# Patient Record
Sex: Female | Born: 1955
Health system: Southern US, Community
[De-identification: ages and names within clinical notes are randomized; demographics above are authoritative.]

## PROBLEM LIST (undated history)

## (undated) DIAGNOSIS — R112 Nausea with vomiting, unspecified: Secondary | ICD-10-CM

## (undated) DIAGNOSIS — F419 Anxiety disorder, unspecified: Secondary | ICD-10-CM

## (undated) DIAGNOSIS — T8859XA Other complications of anesthesia, initial encounter: Secondary | ICD-10-CM

## (undated) DIAGNOSIS — T4145XA Adverse effect of unspecified anesthetic, initial encounter: Secondary | ICD-10-CM

## (undated) DIAGNOSIS — Z5189 Encounter for other specified aftercare: Secondary | ICD-10-CM

## (undated) DIAGNOSIS — N3941 Urge incontinence: Secondary | ICD-10-CM

## (undated) DIAGNOSIS — J449 Chronic obstructive pulmonary disease, unspecified: Secondary | ICD-10-CM

## (undated) DIAGNOSIS — Z87442 Personal history of urinary calculi: Secondary | ICD-10-CM

## (undated) DIAGNOSIS — M2550 Pain in unspecified joint: Secondary | ICD-10-CM

## (undated) DIAGNOSIS — I739 Peripheral vascular disease, unspecified: Secondary | ICD-10-CM

## (undated) DIAGNOSIS — F32A Depression, unspecified: Secondary | ICD-10-CM

## (undated) DIAGNOSIS — Z9889 Other specified postprocedural states: Secondary | ICD-10-CM

## (undated) DIAGNOSIS — I10 Essential (primary) hypertension: Secondary | ICD-10-CM

## (undated) DIAGNOSIS — M1990 Unspecified osteoarthritis, unspecified site: Secondary | ICD-10-CM

## (undated) DIAGNOSIS — R0902 Hypoxemia: Secondary | ICD-10-CM

## (undated) DIAGNOSIS — E785 Hyperlipidemia, unspecified: Secondary | ICD-10-CM

## (undated) DIAGNOSIS — I509 Heart failure, unspecified: Secondary | ICD-10-CM

## (undated) DIAGNOSIS — N200 Calculus of kidney: Secondary | ICD-10-CM

## (undated) DIAGNOSIS — M810 Age-related osteoporosis without current pathological fracture: Secondary | ICD-10-CM

## (undated) DIAGNOSIS — F329 Major depressive disorder, single episode, unspecified: Secondary | ICD-10-CM

## (undated) DIAGNOSIS — K219 Gastro-esophageal reflux disease without esophagitis: Secondary | ICD-10-CM

## (undated) DIAGNOSIS — M8700 Idiopathic aseptic necrosis of unspecified bone: Secondary | ICD-10-CM

## (undated) DIAGNOSIS — J45909 Unspecified asthma, uncomplicated: Secondary | ICD-10-CM

## (undated) DIAGNOSIS — K589 Irritable bowel syndrome without diarrhea: Secondary | ICD-10-CM

## (undated) DIAGNOSIS — G894 Chronic pain syndrome: Secondary | ICD-10-CM

## (undated) HISTORY — DX: Irritable bowel syndrome, unspecified: K58.9

## (undated) HISTORY — DX: Depression, unspecified: F32.A

## (undated) HISTORY — DX: Age-related osteoporosis without current pathological fracture: M81.0

## (undated) HISTORY — DX: Hypoxemia: R09.02

## (undated) HISTORY — DX: Essential (primary) hypertension: I10

## (undated) HISTORY — DX: Pain in unspecified joint: M25.50

## (undated) HISTORY — DX: Hyperlipidemia, unspecified: E78.5

## (undated) HISTORY — DX: Anxiety disorder, unspecified: F41.9

## (undated) HISTORY — PX: HERNIA REPAIR: SHX51

## (undated) HISTORY — DX: Encounter for other specified aftercare: Z51.89

## (undated) HISTORY — DX: Chronic obstructive pulmonary disease, unspecified: J44.9

## (undated) HISTORY — PX: JOINT REPLACEMENT: SHX530

## (undated) HISTORY — PX: NASAL SINUS SURGERY: SHX719

## (undated) HISTORY — DX: Heart failure, unspecified: I50.9

## (undated) HISTORY — DX: Unspecified asthma, uncomplicated: J45.909

## (undated) HISTORY — PX: TOTAL ABDOMINAL HYSTERECTOMY: SHX209

## (undated) HISTORY — PX: ABDOMINAL HYSTERECTOMY: SHX81

## (undated) HISTORY — DX: Gastro-esophageal reflux disease without esophagitis: K21.9

## (undated) HISTORY — DX: Calculus of kidney: N20.0

## (undated) HISTORY — DX: Unspecified osteoarthritis, unspecified site: M19.90

## (undated) HISTORY — DX: Urge incontinence: N39.41

## (undated) HISTORY — DX: Chronic pain syndrome: G89.4

## (undated) HISTORY — DX: Major depressive disorder, single episode, unspecified: F32.9

## (undated) HISTORY — DX: Idiopathic aseptic necrosis of unspecified bone: M87.00

## (undated) HISTORY — PX: OTHER SURGICAL HISTORY: SHX169

---

## 2003-06-01 ENCOUNTER — Other Ambulatory Visit: Payer: Self-pay

## 2003-06-23 ENCOUNTER — Other Ambulatory Visit: Payer: Self-pay

## 2003-08-05 ENCOUNTER — Inpatient Hospital Stay (HOSPITAL_COMMUNITY): Admission: RE | Admit: 2003-08-05 | Discharge: 2003-08-08 | Payer: Self-pay | Admitting: Neurosurgery

## 2003-08-26 ENCOUNTER — Inpatient Hospital Stay (HOSPITAL_COMMUNITY): Admission: AD | Admit: 2003-08-26 | Discharge: 2003-09-03 | Payer: Self-pay | Admitting: Neurosurgery

## 2004-06-13 ENCOUNTER — Inpatient Hospital Stay (HOSPITAL_COMMUNITY): Admission: EM | Admit: 2004-06-13 | Discharge: 2004-06-19 | Payer: Self-pay | Admitting: Emergency Medicine

## 2005-01-15 ENCOUNTER — Encounter: Payer: Self-pay | Admitting: Unknown Physician Specialty

## 2005-02-08 ENCOUNTER — Ambulatory Visit: Payer: Self-pay | Admitting: Unknown Physician Specialty

## 2005-02-28 ENCOUNTER — Ambulatory Visit: Payer: Self-pay | Admitting: Unknown Physician Specialty

## 2005-07-30 ENCOUNTER — Inpatient Hospital Stay: Payer: Self-pay | Admitting: Gastroenterology

## 2005-12-18 ENCOUNTER — Other Ambulatory Visit: Payer: Self-pay

## 2005-12-18 ENCOUNTER — Inpatient Hospital Stay: Payer: Self-pay | Admitting: Internal Medicine

## 2006-05-15 ENCOUNTER — Encounter: Admission: RE | Admit: 2006-05-15 | Discharge: 2006-05-15 | Payer: Self-pay | Admitting: Orthopedic Surgery

## 2006-06-10 ENCOUNTER — Ambulatory Visit: Payer: Self-pay | Admitting: Unknown Physician Specialty

## 2006-06-12 ENCOUNTER — Other Ambulatory Visit: Payer: Self-pay

## 2006-06-12 ENCOUNTER — Inpatient Hospital Stay: Payer: Self-pay | Admitting: Internal Medicine

## 2006-07-30 ENCOUNTER — Encounter: Admission: RE | Admit: 2006-07-30 | Discharge: 2006-10-28 | Payer: Self-pay | Admitting: Anesthesiology

## 2006-09-02 ENCOUNTER — Ambulatory Visit: Payer: Self-pay | Admitting: Anesthesiology

## 2006-09-20 ENCOUNTER — Emergency Department: Payer: Self-pay

## 2006-09-29 ENCOUNTER — Ambulatory Visit (HOSPITAL_COMMUNITY): Admission: RE | Admit: 2006-09-29 | Discharge: 2006-09-29 | Payer: Self-pay | Admitting: Gastroenterology

## 2006-11-05 ENCOUNTER — Ambulatory Visit: Payer: Self-pay | Admitting: General Surgery

## 2006-11-12 ENCOUNTER — Inpatient Hospital Stay: Payer: Self-pay | Admitting: General Surgery

## 2007-03-02 ENCOUNTER — Ambulatory Visit: Payer: Self-pay | Admitting: Pain Medicine

## 2007-05-22 ENCOUNTER — Encounter: Admission: RE | Admit: 2007-05-22 | Discharge: 2007-05-26 | Payer: Self-pay | Admitting: Anesthesiology

## 2007-05-26 ENCOUNTER — Ambulatory Visit: Payer: Self-pay | Admitting: Anesthesiology

## 2007-06-12 ENCOUNTER — Encounter
Admission: RE | Admit: 2007-06-12 | Discharge: 2007-06-16 | Payer: Self-pay | Admitting: Physical Medicine & Rehabilitation

## 2007-06-16 ENCOUNTER — Ambulatory Visit: Payer: Self-pay | Admitting: Physical Medicine & Rehabilitation

## 2007-10-06 ENCOUNTER — Emergency Department: Payer: Self-pay

## 2007-10-06 ENCOUNTER — Other Ambulatory Visit: Payer: Self-pay

## 2008-05-03 ENCOUNTER — Ambulatory Visit: Payer: Self-pay | Admitting: Cardiovascular Disease

## 2009-06-07 ENCOUNTER — Inpatient Hospital Stay: Payer: Self-pay | Admitting: Internal Medicine

## 2010-05-02 ENCOUNTER — Ambulatory Visit: Payer: Self-pay

## 2010-06-12 ENCOUNTER — Inpatient Hospital Stay: Payer: Self-pay | Admitting: Internal Medicine

## 2010-07-03 NOTE — Procedures (Signed)
NAME:  Amy, Rocha NO.:  [id number]   MEDICAL RECORD NO.:  [id number]          PATIENT TYPE:  REC   LOCATION:  TPC                          FACILITY:  [facility]   PHYSICIAN:  Rosann Auerbach, MD        DATE OF BIRTH:  10/02/1955   DATE OF PROCEDURE:  09/02/2006  DATE OF DISCHARGE:                               OPERATIVE REPORT   A[facility] Rocha comes [facility] to evaluate and  review health and history form, 14 point review of systems, examine  historical features suggest SI pain, and I think it is probably  reasonable to go on to SI interventional injection inferior medial  margin of the true synovium, and follow expectantly.  She had a number  of hip replacements, added biomechanical stress at this joint, and I  think this is an interesting point of focus for [place] as diagnostic  therapeutic, possible RF candidate.   Other modified with features in health profile discussed.  She is a  motivated engaging individual and I think she will do well.  She wants  to avoid escalation of controlled substances.   She also has a mechanical pain above the site, at the facet but I do not  think we  need to address this at this time.  I want to see how she does  with SI.  She is complaining of leg pain, that could be attributed to  the SI, and I think this injection with very revealing for [place].  I  discussed with her that there is not complete she has mixed anterior  posterior compartment elements, some diskogenic disease, and to increase  her function and quality of life as I think tincture time is our best  friend.   OBJECTIVELY:  Diffuse paralumbar myofascial, Horton's test positive,  range of motion impaired secondary to pain, side bending positive.  Nothing new neurologically. Her typical pain mechanical diskogenic,  sacral joint supported.   Modified gains using Patrick's positive.   IMPRESSION:  Sacral joint pain, degenerative spine disease of the  lumbar  spine with facet overlay.   PLAN:  Sacral joint intervention, right and left side inferior medial  margin, under local anesthetic and she is consented.   DESCRIPTION OF PROCEDURE:  The patient taken to fluoroscopy suite and  placed in the prone position.  Back prepped, draped usual fashion.  Bilateral injection, local anesthetic, under fluoroscopic observation,  right and left side advanced the inferior medial margin the sacral joint  and confirmed placement. I then inject 2 mL of lidocaine and 20 mg of  Aristocort to each side.   She tolerated the procedure well.  No complications from our procedure.  Appropriate recovery.  Discharge instructions given.  I will see her  follow-up.           ______________________________  Rosann Auerbach, MD     HH/MEDQ  D:  09/02/2006 09:46:48  T:  09/02/2006 23:25:26  Job:  [id number]   cc:   Lesleigh Noe, M.D.

## 2010-07-03 NOTE — Op Note (Signed)
NAMESHETARA, BERROA NO.:  [id number]   MEDICAL RECORD NO.:  [id number]          PATIENT TYPE:  AMB   LOCATION:  ENDO                         FACILITY:  [facility]   PHYSICIAN:  Mayme Genta, M.D.DATE OF BIRTH:  Feb 20, 1955   DATE OF PROCEDURE:  09/29/2006  DATE OF DISCHARGE:  09/29/2006                               OPERATIVE REPORT   PROCEDURE PERFORMED:  Esophageal manometry.   INDICATIONS FOR PROCEDURE:  This is a 55 year old female referred by Dr.  Hervey Ard with symptoms of pyrosis, regurgitation and dysphagia,  symptoms occurring despite Prilosec maintenance.   DESCRIPTION OF PROCEDURE:  After reviewing the nature of the procedure  with the patient including the potential risks of hemorrhage,  perforation, aspiration, informed consent was signed.  The procedure was  completed using a low compliance nasoesophageal impedance catheter.  No  complications were noted during or immediately following the procedure.  The intubation was difficult due to a reportedly large hiatal hernia.   RESULTS:  The lower esophageal sphincter was located from 44 to 48 cm,  with a total length of 4.0 cm.  Ileus pressure measured 29.1 mmHg which  is within normal range (10-45).  A series of wet swallows were then  analyzed, 11 in all.  With these an average residual pressure of 11 mmHg  at the ileus was noted.  There was 60% relaxation overall.  The  esophageal body was normal with normal amplitudes ranging from 80 to 108  and duration of 1.8 to 2.3 seconds.   The upper esophageal sphincter was measured with a UES pressure of 42.2  mmHg.  Excellent coordination with propagated wet swallows was noted.   The catheter was withdrawn.  Again, the patient tolerated the procedure  without complication.   ASSESSMENT:  Borderline study with slight increased lower esophageal  sphincter pressure, slight decreased relaxation, but without other  features of achalasia, e.g., the  patient had normal peristaltic  esophageal body contractions.   RECOMMENDATIONS:  Per Dr. Hervey Ard.  I would recommend caution if  an antireflux procedure is being recommended, given the patient's  slightly elevated LESP.      Mayme Genta, M.D.  Electronically Signed     JRM/MEDQ  D:  10/01/2006  T:  10/01/2006  Job:  [id number]   cc:   Job Founds
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
## 2010-07-03 NOTE — Assessment & Plan Note (Signed)
Amy Rocha is a patient known to me from previous intervention last  summer.  Complaining of hip, right bursa pain, pain in the paralumbar  region with functional impairment secondary to pain.  Hip replacements.  She has also been followed in the past by our colleague, Dr. Milinda Pointer, and with her leg pain and paralumbar discomfort, she has been  addressed from a multimodality standpoint.  She has trialed a dorsal  column stimulator, followed by staff and this has been an unfortunate  recurrent problem with her.  She unfortunately had an aspiration episode  that left her with a tracheostomy and coma, this was removed, revised,  but she has some paracervical discomfort described as mild fascial from  this as well.  Paralumbar position is most problematic, with difficulty  in endurance and range of motion activities.  She is on OxyContin, which  she states helps her.  I reviewed the risks, awards, benefits, side  effects, opioid consent patient care agreement is reviewed.  She is  referred to [place] for consideration of controlled substance management and  chronic pain therapy.   OBJECTIVE:  I do observe the site where the stimulator was closed by  secondary intent.  Diffuse parallel ___________, Gilles Chiquito test positive.  A right bursa is identified, particularly with rotational pain and point  tenderness.  Pain with extension of the lumbar spine.  Rotational pain  to both hips.  Nothing new neurologically.  Modified Gaenslen and  Saralyn Pilar equivocal.  This seems to actually be improved.   IMPRESSION:  [name] has __________ lumbar spine, probable facets interim  sacral joint pain, chronic hip pain with bursa pain.  Mild fascial pain  in the cervical spine.   PLAN:  1. Recently had an MRI.  Will get that report.  It sounded like she      does have a facet pain.  2. Consider further intervention.  I introduced her to Dr. Letta Pate,      and he will be following up with her.  I  recommend a bursa      injection to start.  Possible facet intervention.  3. Other modifiable features and health profile discussed.  Weight      control.  She quit smoking in 1998.  She has some baseline COPD.      She will maintain contact with primary care.  4. UDS at next visit.  Will also go ahead and introduce principles of      adherence monitoring, and I have reviewed that with her.  Risk,      award, benefit of controlled substances is reviewed.  Rehab      colleagues will help [place] with this as well.  Consider TENS      technology.  Flector patch as an adjunct.   Discharge instructions given.  No barrier to communication.  Dr.  Letta Pate and I have discussed her extensively.           ______________________________  Rosann Auerbach, MD     HH/MedQ  D:  05/26/2007 11:12:08  T:  05/26/2007 11:53:03  Job #:  [id number]   cc:   Charlett Blake, M.D.  Fax: [phone]   Janora Norlander

## 2010-07-03 NOTE — Procedures (Signed)
NAMELUXIE, MARABELLA NO.:  [id number]   MEDICAL RECORD NO.:  [id number]           PATIENT TYPE:   LOCATION:                                 FACILITY:   PHYSICIAN:  Charlett Blake, M.D.DATE OF BIRTH:  01-01-1956   DATE OF PROCEDURE:  DATE OF DISCHARGE:                               OPERATIVE REPORT   Right hip trochanteric bursa injection.   INDICATIONS:  Right trochanteric bursitis, unable to take NSAIDs  secondary to history of gastric ulcers and esophageal erosions.  Pain is  only partially responsive to medication management including narcotic  analgesic medications.   Informed consent was obtained after describing risks and benefits of the  procedure to the patient including bleeding, bruising, infection.  She  elected to proceed and has given written consent.  The patient placed in  the left lateral decubitus position.  Area marked, prepped with Betadine  over the greater trochanter.  Entry with 25-gauge 1-1/2 inch needle to  bone contact, infiltrated solution containing 1 mL of 40 mg/mL of Depo-  Medrol and 4 mL of 1% lidocaine in a fan-like pattern after negative  drawback for blood.  The patient tolerated the procedure well.  Post  injection instructions given.      Charlett Blake, M.D.  Electronically Signed     AEK/MEDQ  D:  06/16/2007 10:24:02  T:  06/16/2007 10:36:41  Job:  [id number]

## 2010-07-06 NOTE — H&P (Signed)
NAME:  Amy Rocha, GEARHEART NO.:  [id number]   MEDICAL RECORD NO.:  [id number]          PATIENT TYPE:  EMS   LOCATION:  MAJO                         FACILITY:  [facility]   PHYSICIAN:  Cyril Mourning, D.O.    DATE OF BIRTH:  11-11-1955   DATE OF ADMISSION:  06/13/2004  DATE OF DISCHARGE:                                HISTORY & PHYSICAL   PRIMARY CARE PHYSICIAN:  Unassigned to this hospital.  She does see Dr.  Kathrine Haddock as her primary care Cal[facility] Mad[facility].   CHIEF COMPLAINT:  Nausea and vomiting.   HISTORY OF PRESENTING ILLNESS:  This is a pleasant 55 year old female  independent with her ADLs, who presents with initial complaints of shortness  of breath and nausea and vomiting that have been constant without any waxing  or waning features.  She states she had dark emesis and loose stools.  The  last time she ate was on Monday.  She states that she has been unable to  hold food down at this point.   She does not recall eating out or eating out of her usual pattern.   She did report some shortness of breath on initial presentation; however,  she denies this at this time.  She says she does develop this with some  exertion.   PAST MEDICAL HISTORY:  1.  History of hip replacements x5 due to avascular necrosis.  2.  Pneumonia in 1998.  3.  Hypertension, 2 years of known diagnosis.  4.  Status post hysterectomy; she retains her ovaries.   No other surgeries that she has mentioned.   MEDICATIONS:  1.  She takes Tylenol No. 3.  2.  She takes Lexapro 20 mg daily.  3.  Diovan 160 mg daily.   ALLERGIES:  She is not allergic to any medications.   SOCIAL HISTORY:  She lives with her husband.  She denies tobacco or alcohol.  She is on total disability.   REVIEW OF SYSTEMS:  She reports some weight loss.  She reports some  dizziness.  She reports some coffee-colored stools, no hematemesis was  reported, no coffee-grounds emesis or stool were reported.  She has no  increased  swelling of her lower extremities.  Otherwise, further review of  systems was unremarkable.   FAMILY HISTORY:  Noncontributory.   PHYSICAL EXAM:  VITAL SIGNS:  Blood pressure was 137/85, respirations 20,  heart rate 120-130, O2 saturation 97% on room air.  GENERAL:  Amy Rocha is awake and alert.  She did appear to be nauseated.  She is in no acute respiratory distress.  HEENT:  Head is normocephalic, atraumatic.  Extraocular muscles are intact.  NECK:  Neck is supple and nontender.  No palpable mass or thyromegaly.  CARDIOVASCULAR:  Exam reveals normal S1 and S2, without S3 or S4.  PULMONARY:  Exam reveals her lungs to be clear to auscultation bilaterally  with normal effort and no dullness to percussion.  NEUROLOGIC:  Exam reveals no focal neurologic deficits.   LABORATORY DATA:  The patient's initial CK was 750 with an MB fraction of  26.4 and  a relative index of 13.3.  Her creatinine is 1.2. Hemoglobin 17.3.  Sodium was 138, potassium 27.  WBC of 19, hemoglobin 15, platelet count of  495,000.   EKG revealed sinus tachycardia with left atrial enlargement and questionable  inferior infarct.   ASSESSMENT:  1.  Intractable nausea and vomiting.  2.  Abnormal EKG with elevated CK-MB fractions.  3.  Leukocytosis.  4.  Hypokalemia.  5.  Hypertension.  6.  History of pneumonia.   PLAN:  At this time, we are going to admit Ms. Abed for further  evaluation.  We are going to keep her n.p.o. for now except sips of H20,  follow course clinically, administer IV fluids and I will cycle cardiac  markers to ensure that she has not suffered an ischemic event.  Her EKG is  borderline in appearance with respect to the inferior leads.  Continue  antiemetic management.  We will supplement her oxygen in addition to  initiate the very low-dose beta blocker therapy as her blood pressure  tolerates.      ESS/MEDQ  D:  06/13/2004  T:  06/13/2004  Job:  [id number]

## 2010-07-06 NOTE — Consult Note (Signed)
NAMEHOPE, ROSENDO NO.:  [id number]   MEDICAL RECORD NO.:  [id number]          PATIENT TYPE:  INP   LOCATION:  2020                         FACILITY:  [facility]   PHYSICIAN:  Hanley Ben, M.D.  DATE OF BIRTH:  02/10/1956   DATE OF CONSULTATION:  06/14/2004  DATE OF DISCHARGE:                                   CONSULTATION   REASON FOR CONSULTATION:  Abdominal pain and left kidney stone.   The patient is a 55 year old female who started having left flank pain four  days ago.  The pain became severe three days ago and was associated with  nausea and vomiting.  She came to the emergency room and was then admitted  for further evaluation and treatment.  She was seen by GI and had an  endoscopy that showed distal erosive esophagitis.  A CT scan of the abdomen  and pelvis showed a 5 mm stone in the left upper ureter.  The patient  continues to complain of abdominal pain and has a nasogastric tube in place.  She had no previous history of kidney stone and had no frequency, urgency,  hesitancy or hematuria.  She now has an indwelling Foley catheter that is  draining clear urine.   PAST MEDICAL HISTORY:  Positive for hypertension.   MEDICATIONS:  Diovan 160 mg and Lexapro 20 mg.   ALLERGIES:  No known drug allergies.   PAST SURGICAL HISTORY:  She had four hip replacements on the left side and  one hip replacement on the right side, and hysterectomy.   SOCIAL HISTORY:  She is married, has two children.  Does not smoke nor  drink.   FAMILY HISTORY:  Her father died of colon cancer.  Her mother is doing well.  She had two brothers.  One had kidney stones, and he died of a brain  aneurysm, and she has one sister.   REVIEW OF SYSTEMS:  She has no cough, no shortness of breath, no hemoptysis,  no palpitations, no chest pain.  She has nausea and vomiting, and she has a  nasogastric tube in place, and she complains of abdominal pain.  She has no  diarrhea or  constipation.  She has a Foley catheter that is now draining  clear urine, but she did not have any voiding symptoms before  hospitalization.  All others are negative.   PHYSICAL EXAMINATION:  VITAL SIGNS:  Blood pressure is 120/80, pulse 124,  respirations 20, temperature 101.  HEENT:  Her head is normal.  Pupils are equal and reactive to light and  accommodation.  She has pink conjunctivae.  She has nonicteric sclerae.  Ears, nose and throat are within normal limits.  NECK:  Supple, no cervical lymph nodes, no thyromegaly.  CHEST:  Symmetrical.  Lungs are fully expanded and clear to percussion and  auscultation.  CARDIAC:  Regular rhythm, no murmur, no gallops.  ABDOMEN:  Soft, tender in the left flank.  She has left CVA tenderness.  Kidneys are not palpable.  She has no hepatomegaly, no splenomegaly.  No  inguinal hernia.  Bladder is not  distended.  Bowel sounds are normal.  GENITOURINARY:  She has normal external genitalia.  Meatus is normal.  She  has no caruncle.  She has a Foley catheter that is draining clear urine.  She has no cystocele.  PELVIC:  There is no pelvic mass.  She is status post a hysterectomy.  There  is no adnexal mass.  RECTAL:  Sphincter tone is normal, there is no evidence of rectal mass.   Hemoglobin is 15.4, hematocrit of 43.8, WBC 19,000.  BUN is 27 and sodium  138, potassium 3.5, glucose 222, creatinine 1.2.  I reviewed the CT scan,  and it showed a 5 mm calculus in the proximal left ureter distal to the UPJ  and a simple cyst in the lower pole of the left kidney.   IMPRESSION:  Left ureteral calculus and left renal cyst.   SUGGESTIONS:  Urine culture and sensitivity.  Continue Zosyn.  Cystoscopy  and retrograde pyelogram and insertion of double J catheter to relieve the  pain associated with the ureteral calculus.  The procedure, the risks and  the benefits have been discussed with the patient and her husband.  They  understand and are agreeable.  We  will schedule her for the procedure to be  done tomorrow morning either at [facility] or at [facility].      MN/MEDQ  D:  06/14/2004  T:  06/14/2004  Job:  [id number]   cc:   Rexene Alberts, M.D.

## 2010-07-06 NOTE — Discharge Summary (Signed)
Amy Rocha, LAL NO.:  [id number]   MEDICAL RECORD NO.:  [id number]          PATIENT TYPE:  INP   LOCATION:  2020                         FACILITY:  [facility]   PHYSICIAN:  Barbette Merino, M.D.      DATE OF BIRTH:  October 18, 1955   DATE OF ADMISSION:  06/13/2004  DATE OF DISCHARGE:  06/19/2004                                 DISCHARGE SUMMARY   DISCHARGE DIAGNOSIS:  1.  Enterobacter urinary tract infection.  2.  Erosive esophagitis with hiatal hernia.  3.  Severe anxiety.  4.  Hypertension.  5.  Renal calculi.  6.  Sore throat.  7.  Perianal rash.   DISCHARGE MEDICATIONS:  Aspirin 81 mg daily, Atenolol 25 mg daily, Lexapro  10 mg daily, Ciprofloxacin 25 mg b.i.d. for five more days, Protonix 40 mg  b.i.d., Lotrimin 1% cream applied to the perianal area, Vicodin 5/500 1-2  tablets every 6 hours as needed for pain.   DISPOSITION:  The patient is discharged in fair health.  She is to follow up  with Dr. Burnett Harry in his office in the next two weeks.  Also, the patient has  been instructed to monitor her fluid intake.  Also, she will continue to  take her Protonix twice a day.   PROCEDURE PERFORMED:  1.  Acute abdominal series performed on June 13, 2004, that shows probable      hiatal hernia, normal bowel gas pattern, calcification in the left side      of the abdomen and pelvis.  Abdominal ultrasound on the same day showed      nonvisualized pancreas due to overlying bowel gas, 4.9 cm simple cyst      arising from the lower pole of the left kidney.  2.  CT of the abdomen and pelvis performed on June 13, 2004, showed a 5 mm      left UPJ calculus with inflammatory changes of the ureter.  It was      nonobstructive calculus.  NG tube was seen in the duodenum.  3.  Cystoscopy and left retrograde pyelogram as well as insertion of left      double  J catheter performed by Dr. Hanley Ben on June 15, 2004.  4.  EGD performed on June 13, 2004, by Dr. Herbie Baltimore Buccini  that shows      extensive erosive esophagitis, no definite source of GI bleed      identified, no active bleeding at the time of exam.  Also, no reason for      nausea and vomiting seen endoscopically but moderately large hiatal      hernia with patulous diaphragmatic hiatus.   CONSULTATIONS:  1.  Ronald Lobo, M.D., GI.  2.  Hanley Ben, M.D., urologist   BRIEF HISTORY AND PHYSICAL:  Please refer to dictated history and physical  by Dr. [name], in short, however, this is a 55 year old white female  who presented with initial complaint of shortness of breath and nausea and  vomiting which has been constant without any waxing and waning.  She also  reported having some dark  emesis and loose stools.  The patient said that  she has been unable to keep anything down.  She apparently had a similar  episode last year while she was on a trip to [place].  In the ED, she was  found to be retching with vomiting.  She was also nauseated continuously at  the time.  She was dehydrated and tachycardic with a heart rate of 120 to  130.  Otherwise, her physical exam was OK.  Her vital signs showed initial  CK 750 with MB fraction that was low.  Creatinine 1.3, hemoglobin 17.3.  Her  white count was 19,000.  Her EKG showed only sinus tachycardia with left  atrial enlargement and questionable inferior infarction.  The patient was  subsequently admitted for management of intractable nausea and vomiting and  hematemesis.   HOSPITAL COURSE:  Problem 1:  Intractable nausea and vomiting.  The patient was admitted for  management of these findings and had immediate GI consult.  She had an EGD  performed to further evaluate on the day of admission.  The EGD report is as  indicated above showing mainly hiatal hernia but no other source.  She was  started on a combination of Phenergan and Zofran as well as Reglan while in  the hospital.  Her PPI was also doubled.  The patient gradually got better  and  her symptoms resolved.  She, however, also had fever and evidence of a  UTI which was thought to be probably responsible for her intractable nausea  and vomiting.  Other possible causes were found on her CT.  Subsequently,  the patient got better with management of these findings.  At the time of  discharge, she is not nauseated.  She is eating well currently after  gradually being put back on clear liquids and then regular diet.   Problem 2:  Left ureter obstruction with inflammation, this was discovered  from the CT scan.  Subsequently, urology was consulted and Dr. Maritssa Norrie saw the  patient.  Based on the location of the calculus and possible hydronephrosis,  he went ahead and did a cystoscopy on the patient and put in a J catheter.  The stent is still in place and hopefully, the stone will pass.  The patient  continued to have pain for a couple of days thereafter, but improved and at  the time of discharge, she is not having much pain, only occasional left  lower quadrant discomfort.   Problem 3:  Erosive gastritis.  The patient had erosive gastritis on the EGD  which was thought to be worsened by her hiatal hernia.  The patient was  placed on twice a day Protonix and this seems to be helping the patient's  symptoms at this time.   Problem 4:  UTI.  The patient had Enterococcus bacteria grown from her  urine.  Sensitivity showed that this was sensitive to Cipro.  She was  initially on Rocephin for her UTI and then Zosyn for coverage.  However,  after sensitivities came back, she was switched to ciprofloxacin and is now  being discharged on oral Cipro for another five days so she will have a  total of ten days antibiotic therapy.  She improved and at the time of  discharge, she is not having any urinary symptoms.   Problem 5:  Hypokalemia.  The patient had hypokalemia from her nausea and  vomiting.  Her potassium was repleted and she did very well.  Problem 6:  Depression/anxiety.  She  had an anxiety disorder, she has been  on Lexapro.  During this admission, she was initially put on IV Ativan while  she was n.p.o.  However, she was placed back on her Lexapro as soon as she  started eating.   Problem 7:  Increased CK.  On admission, CK was as high as 1985, but  gradually came down.  She had serial cardiac enzymes especially because of  the EKG changes.  There was no evidence of cardiac event.  Her LFTs were  also elevated but returned to normal.  Abdominal ultrasound showed no  evidence of gallstones.   Problem 8:  Perianal rash.  The patient complained of an anal rash on June 17, 2004, with itching and pain.  She was initiated on Lotrimin cream which  she will continue to use at home.   Problem 9:  Sore throat.  The patient complained of sore throat from her  EGD.  She was given Chloraseptic mouth spray which seemed to help the  patient a lot.      LG/MEDQ  D:  06/19/2004  T:  06/19/2004  Job:  [id number]   cc:   Zella Richer. Burnett Harry, M.D.  [place]  [place] [address]  Fax: [phone]

## 2011-02-22 DIAGNOSIS — E785 Hyperlipidemia, unspecified: Secondary | ICD-10-CM | POA: Diagnosis not present

## 2011-02-22 DIAGNOSIS — I10 Essential (primary) hypertension: Secondary | ICD-10-CM | POA: Diagnosis not present

## 2011-03-14 DIAGNOSIS — B351 Tinea unguium: Secondary | ICD-10-CM | POA: Diagnosis not present

## 2011-03-14 DIAGNOSIS — J209 Acute bronchitis, unspecified: Secondary | ICD-10-CM | POA: Diagnosis not present

## 2011-03-14 DIAGNOSIS — J449 Chronic obstructive pulmonary disease, unspecified: Secondary | ICD-10-CM | POA: Diagnosis not present

## 2011-03-14 DIAGNOSIS — G8929 Other chronic pain: Secondary | ICD-10-CM | POA: Diagnosis not present

## 2011-03-19 DIAGNOSIS — R05 Cough: Secondary | ICD-10-CM | POA: Diagnosis not present

## 2011-03-19 DIAGNOSIS — J45909 Unspecified asthma, uncomplicated: Secondary | ICD-10-CM | POA: Diagnosis not present

## 2011-05-22 DIAGNOSIS — G8929 Other chronic pain: Secondary | ICD-10-CM | POA: Diagnosis not present

## 2011-08-20 DIAGNOSIS — E785 Hyperlipidemia, unspecified: Secondary | ICD-10-CM | POA: Diagnosis not present

## 2011-08-20 DIAGNOSIS — I10 Essential (primary) hypertension: Secondary | ICD-10-CM | POA: Diagnosis not present

## 2011-10-29 DIAGNOSIS — J449 Chronic obstructive pulmonary disease, unspecified: Secondary | ICD-10-CM | POA: Diagnosis not present

## 2011-10-29 DIAGNOSIS — Z23 Encounter for immunization: Secondary | ICD-10-CM | POA: Diagnosis not present

## 2011-10-29 DIAGNOSIS — G8929 Other chronic pain: Secondary | ICD-10-CM | POA: Diagnosis not present

## 2012-02-26 DIAGNOSIS — I10 Essential (primary) hypertension: Secondary | ICD-10-CM | POA: Diagnosis not present

## 2012-02-26 DIAGNOSIS — G8929 Other chronic pain: Secondary | ICD-10-CM | POA: Diagnosis not present

## 2012-02-26 DIAGNOSIS — E7800 Pure hypercholesterolemia, unspecified: Secondary | ICD-10-CM | POA: Diagnosis not present

## 2012-02-26 DIAGNOSIS — E785 Hyperlipidemia, unspecified: Secondary | ICD-10-CM | POA: Diagnosis not present

## 2012-02-26 DIAGNOSIS — Z79899 Other long term (current) drug therapy: Secondary | ICD-10-CM | POA: Diagnosis not present

## 2012-05-19 ENCOUNTER — Emergency Department: Payer: Self-pay | Admitting: Emergency Medicine

## 2012-05-19 DIAGNOSIS — S42309A Unspecified fracture of shaft of humerus, unspecified arm, initial encounter for closed fracture: Secondary | ICD-10-CM | POA: Diagnosis not present

## 2012-05-19 DIAGNOSIS — S32509A Unspecified fracture of unspecified pubis, initial encounter for closed fracture: Secondary | ICD-10-CM | POA: Diagnosis not present

## 2012-05-19 DIAGNOSIS — S52609A Unspecified fracture of lower end of unspecified ulna, initial encounter for closed fracture: Secondary | ICD-10-CM | POA: Diagnosis not present

## 2012-05-19 DIAGNOSIS — S52509A Unspecified fracture of the lower end of unspecified radius, initial encounter for closed fracture: Secondary | ICD-10-CM | POA: Diagnosis not present

## 2012-05-19 DIAGNOSIS — Z043 Encounter for examination and observation following other accident: Secondary | ICD-10-CM | POA: Diagnosis not present

## 2012-05-19 DIAGNOSIS — S5290XA Unspecified fracture of unspecified forearm, initial encounter for closed fracture: Secondary | ICD-10-CM | POA: Diagnosis not present

## 2012-05-19 DIAGNOSIS — S52599A Other fractures of lower end of unspecified radius, initial encounter for closed fracture: Secondary | ICD-10-CM | POA: Diagnosis not present

## 2012-05-25 DIAGNOSIS — S32509A Unspecified fracture of unspecified pubis, initial encounter for closed fracture: Secondary | ICD-10-CM | POA: Diagnosis not present

## 2012-05-25 DIAGNOSIS — S52599A Other fractures of lower end of unspecified radius, initial encounter for closed fracture: Secondary | ICD-10-CM | POA: Diagnosis not present

## 2012-06-01 DIAGNOSIS — S5290XD Unspecified fracture of unspecified forearm, subsequent encounter for closed fracture with routine healing: Secondary | ICD-10-CM | POA: Diagnosis not present

## 2012-06-01 DIAGNOSIS — IMO0001 Reserved for inherently not codable concepts without codable children: Secondary | ICD-10-CM | POA: Diagnosis not present

## 2012-06-10 DIAGNOSIS — E785 Hyperlipidemia, unspecified: Secondary | ICD-10-CM | POA: Diagnosis not present

## 2012-06-10 DIAGNOSIS — G8929 Other chronic pain: Secondary | ICD-10-CM | POA: Diagnosis not present

## 2012-06-22 DIAGNOSIS — S5290XD Unspecified fracture of unspecified forearm, subsequent encounter for closed fracture with routine healing: Secondary | ICD-10-CM | POA: Diagnosis not present

## 2012-06-22 DIAGNOSIS — IMO0001 Reserved for inherently not codable concepts without codable children: Secondary | ICD-10-CM | POA: Diagnosis not present

## 2012-07-10 DIAGNOSIS — G8929 Other chronic pain: Secondary | ICD-10-CM | POA: Diagnosis not present

## 2012-07-10 DIAGNOSIS — E785 Hyperlipidemia, unspecified: Secondary | ICD-10-CM | POA: Diagnosis not present

## 2012-07-15 DIAGNOSIS — IMO0001 Reserved for inherently not codable concepts without codable children: Secondary | ICD-10-CM | POA: Diagnosis not present

## 2012-07-15 DIAGNOSIS — S5290XD Unspecified fracture of unspecified forearm, subsequent encounter for closed fracture with routine healing: Secondary | ICD-10-CM | POA: Diagnosis not present

## 2012-08-31 DIAGNOSIS — I10 Essential (primary) hypertension: Secondary | ICD-10-CM | POA: Diagnosis not present

## 2012-08-31 DIAGNOSIS — E785 Hyperlipidemia, unspecified: Secondary | ICD-10-CM | POA: Diagnosis not present

## 2012-09-29 ENCOUNTER — Ambulatory Visit: Payer: Self-pay

## 2012-09-29 DIAGNOSIS — G8929 Other chronic pain: Secondary | ICD-10-CM | POA: Diagnosis not present

## 2012-09-29 DIAGNOSIS — M810 Age-related osteoporosis without current pathological fracture: Secondary | ICD-10-CM | POA: Diagnosis not present

## 2012-09-29 DIAGNOSIS — Z1231 Encounter for screening mammogram for malignant neoplasm of breast: Secondary | ICD-10-CM | POA: Diagnosis not present

## 2012-09-29 DIAGNOSIS — I10 Essential (primary) hypertension: Secondary | ICD-10-CM | POA: Diagnosis not present

## 2012-12-30 DIAGNOSIS — I10 Essential (primary) hypertension: Secondary | ICD-10-CM | POA: Diagnosis not present

## 2012-12-30 DIAGNOSIS — G8929 Other chronic pain: Secondary | ICD-10-CM | POA: Diagnosis not present

## 2012-12-30 DIAGNOSIS — J449 Chronic obstructive pulmonary disease, unspecified: Secondary | ICD-10-CM | POA: Diagnosis not present

## 2012-12-30 DIAGNOSIS — Z23 Encounter for immunization: Secondary | ICD-10-CM | POA: Diagnosis not present

## 2012-12-30 DIAGNOSIS — E785 Hyperlipidemia, unspecified: Secondary | ICD-10-CM | POA: Diagnosis not present

## 2013-03-26 DIAGNOSIS — I509 Heart failure, unspecified: Secondary | ICD-10-CM | POA: Diagnosis not present

## 2013-03-26 DIAGNOSIS — Z79899 Other long term (current) drug therapy: Secondary | ICD-10-CM | POA: Diagnosis not present

## 2013-03-26 DIAGNOSIS — I10 Essential (primary) hypertension: Secondary | ICD-10-CM | POA: Diagnosis not present

## 2013-03-26 DIAGNOSIS — G8929 Other chronic pain: Secondary | ICD-10-CM | POA: Diagnosis not present

## 2013-03-26 DIAGNOSIS — E785 Hyperlipidemia, unspecified: Secondary | ICD-10-CM | POA: Diagnosis not present

## 2013-03-26 DIAGNOSIS — F322 Major depressive disorder, single episode, severe without psychotic features: Secondary | ICD-10-CM | POA: Diagnosis not present

## 2013-03-26 DIAGNOSIS — I110 Hypertensive heart disease with heart failure: Secondary | ICD-10-CM | POA: Diagnosis not present

## 2013-04-21 DIAGNOSIS — J441 Chronic obstructive pulmonary disease with (acute) exacerbation: Secondary | ICD-10-CM | POA: Diagnosis not present

## 2013-04-21 DIAGNOSIS — G8929 Other chronic pain: Secondary | ICD-10-CM | POA: Diagnosis not present

## 2013-07-19 DIAGNOSIS — E785 Hyperlipidemia, unspecified: Secondary | ICD-10-CM | POA: Diagnosis not present

## 2013-07-19 DIAGNOSIS — J019 Acute sinusitis, unspecified: Secondary | ICD-10-CM | POA: Diagnosis not present

## 2013-07-19 DIAGNOSIS — I10 Essential (primary) hypertension: Secondary | ICD-10-CM | POA: Diagnosis not present

## 2013-08-18 DIAGNOSIS — R1310 Dysphagia, unspecified: Secondary | ICD-10-CM | POA: Diagnosis not present

## 2013-08-18 DIAGNOSIS — K219 Gastro-esophageal reflux disease without esophagitis: Secondary | ICD-10-CM | POA: Diagnosis not present

## 2013-08-19 DIAGNOSIS — K259 Gastric ulcer, unspecified as acute or chronic, without hemorrhage or perforation: Secondary | ICD-10-CM | POA: Diagnosis not present

## 2013-08-24 DIAGNOSIS — K2970 Gastritis, unspecified, without bleeding: Secondary | ICD-10-CM | POA: Diagnosis not present

## 2013-08-24 DIAGNOSIS — R1310 Dysphagia, unspecified: Secondary | ICD-10-CM | POA: Diagnosis not present

## 2013-08-24 DIAGNOSIS — K2990 Gastroduodenitis, unspecified, without bleeding: Secondary | ICD-10-CM | POA: Diagnosis not present

## 2013-08-24 DIAGNOSIS — K2940 Chronic atrophic gastritis without bleeding: Secondary | ICD-10-CM | POA: Diagnosis not present

## 2013-08-24 DIAGNOSIS — K222 Esophageal obstruction: Secondary | ICD-10-CM | POA: Diagnosis not present

## 2013-09-14 ENCOUNTER — Emergency Department: Payer: Self-pay | Admitting: Emergency Medicine

## 2013-09-14 DIAGNOSIS — Z96649 Presence of unspecified artificial hip joint: Secondary | ICD-10-CM | POA: Diagnosis not present

## 2013-09-14 DIAGNOSIS — R443 Hallucinations, unspecified: Secondary | ICD-10-CM | POA: Diagnosis not present

## 2013-09-14 DIAGNOSIS — I10 Essential (primary) hypertension: Secondary | ICD-10-CM | POA: Diagnosis not present

## 2013-09-14 DIAGNOSIS — R5383 Other fatigue: Secondary | ICD-10-CM | POA: Diagnosis not present

## 2013-09-14 DIAGNOSIS — Z9079 Acquired absence of other genital organ(s): Secondary | ICD-10-CM | POA: Diagnosis not present

## 2013-09-14 DIAGNOSIS — F29 Unspecified psychosis not due to a substance or known physiological condition: Secondary | ICD-10-CM | POA: Diagnosis not present

## 2013-09-14 DIAGNOSIS — R5381 Other malaise: Secondary | ICD-10-CM | POA: Diagnosis not present

## 2013-09-14 DIAGNOSIS — Z888 Allergy status to other drugs, medicaments and biological substances status: Secondary | ICD-10-CM | POA: Diagnosis not present

## 2013-09-14 DIAGNOSIS — Z79899 Other long term (current) drug therapy: Secondary | ICD-10-CM | POA: Diagnosis not present

## 2013-09-14 LAB — URINALYSIS, COMPLETE
BACTERIA: NONE SEEN
BLOOD: NEGATIVE
Bilirubin,UR: NEGATIVE
GLUCOSE, UR: NEGATIVE mg/dL (ref 0–75)
KETONE: NEGATIVE
LEUKOCYTE ESTERASE: NEGATIVE
NITRITE: NEGATIVE
PROTEIN: NEGATIVE
Ph: 6 (ref 4.5–8.0)
SPECIFIC GRAVITY: 1.002 (ref 1.003–1.030)
SQUAMOUS EPITHELIAL: NONE SEEN
WBC UR: 3 /HPF (ref 0–5)

## 2013-09-14 LAB — COMPREHENSIVE METABOLIC PANEL
ALBUMIN: 3.9 g/dL (ref 3.4–5.0)
ALT: 45 U/L
AST: 36 U/L (ref 15–37)
Alkaline Phosphatase: 100 U/L
Anion Gap: 10 (ref 7–16)
BUN: 8 mg/dL (ref 7–18)
Bilirubin,Total: 0.4 mg/dL (ref 0.2–1.0)
CALCIUM: 8.8 mg/dL (ref 8.5–10.1)
CREATININE: 0.88 mg/dL (ref 0.60–1.30)
Chloride: 108 mmol/L — ABNORMAL HIGH (ref 98–107)
Co2: 24 mmol/L (ref 21–32)
Glucose: 99 mg/dL (ref 65–99)
OSMOLALITY: 281 (ref 275–301)
POTASSIUM: 3.7 mmol/L (ref 3.5–5.1)
SODIUM: 142 mmol/L (ref 136–145)
Total Protein: 7.5 g/dL (ref 6.4–8.2)

## 2013-09-14 LAB — DRUG SCREEN, URINE
AMPHETAMINES, UR SCREEN: NEGATIVE (ref ?–1000)
BENZODIAZEPINE, UR SCRN: NEGATIVE (ref ?–200)
Barbiturates, Ur Screen: NEGATIVE (ref ?–200)
CANNABINOID 50 NG, UR ~~LOC~~: NEGATIVE (ref ?–50)
COCAINE METABOLITE, UR ~~LOC~~: NEGATIVE (ref ?–300)
MDMA (Ecstasy)Ur Screen: POSITIVE (ref ?–500)
METHADONE, UR SCREEN: NEGATIVE (ref ?–300)
OPIATE, UR SCREEN: NEGATIVE (ref ?–300)
PHENCYCLIDINE (PCP) UR S: NEGATIVE (ref ?–25)
TRICYCLIC, UR SCREEN: NEGATIVE (ref ?–1000)

## 2013-09-14 LAB — CBC
HCT: 47.7 % — AB (ref 35.0–47.0)
HGB: 15.3 g/dL (ref 12.0–16.0)
MCH: 30.2 pg (ref 26.0–34.0)
MCHC: 32 g/dL (ref 32.0–36.0)
MCV: 94 fL (ref 80–100)
Platelet: 385 10*3/uL (ref 150–440)
RBC: 5.06 10*6/uL (ref 3.80–5.20)
RDW: 13.3 % (ref 11.5–14.5)
WBC: 11.6 10*3/uL — AB (ref 3.6–11.0)

## 2013-09-14 LAB — ETHANOL

## 2013-09-14 LAB — ACETAMINOPHEN LEVEL

## 2013-09-14 LAB — SALICYLATE LEVEL: Salicylates, Serum: 2.3 mg/dL

## 2013-10-08 ENCOUNTER — Emergency Department: Payer: Self-pay | Admitting: Internal Medicine

## 2013-10-08 DIAGNOSIS — S63509A Unspecified sprain of unspecified wrist, initial encounter: Secondary | ICD-10-CM | POA: Diagnosis not present

## 2013-10-08 DIAGNOSIS — S6990XA Unspecified injury of unspecified wrist, hand and finger(s), initial encounter: Secondary | ICD-10-CM | POA: Diagnosis not present

## 2013-10-08 DIAGNOSIS — S60229A Contusion of unspecified hand, initial encounter: Secondary | ICD-10-CM | POA: Diagnosis not present

## 2013-10-08 DIAGNOSIS — S60219A Contusion of unspecified wrist, initial encounter: Secondary | ICD-10-CM | POA: Diagnosis not present

## 2013-10-08 DIAGNOSIS — S59919A Unspecified injury of unspecified forearm, initial encounter: Secondary | ICD-10-CM | POA: Diagnosis not present

## 2013-10-08 DIAGNOSIS — I10 Essential (primary) hypertension: Secondary | ICD-10-CM | POA: Diagnosis not present

## 2013-10-08 DIAGNOSIS — M7989 Other specified soft tissue disorders: Secondary | ICD-10-CM | POA: Diagnosis not present

## 2013-10-08 DIAGNOSIS — Z79899 Other long term (current) drug therapy: Secondary | ICD-10-CM | POA: Diagnosis not present

## 2013-10-08 DIAGNOSIS — M79609 Pain in unspecified limb: Secondary | ICD-10-CM | POA: Diagnosis not present

## 2013-10-08 DIAGNOSIS — M25539 Pain in unspecified wrist: Secondary | ICD-10-CM | POA: Diagnosis not present

## 2013-10-08 DIAGNOSIS — S59909A Unspecified injury of unspecified elbow, initial encounter: Secondary | ICD-10-CM | POA: Diagnosis not present

## 2013-10-13 DIAGNOSIS — G8929 Other chronic pain: Secondary | ICD-10-CM | POA: Diagnosis not present

## 2013-10-13 DIAGNOSIS — R809 Proteinuria, unspecified: Secondary | ICD-10-CM | POA: Diagnosis not present

## 2013-10-13 DIAGNOSIS — I129 Hypertensive chronic kidney disease with stage 1 through stage 4 chronic kidney disease, or unspecified chronic kidney disease: Secondary | ICD-10-CM | POA: Diagnosis not present

## 2013-11-23 DIAGNOSIS — D122 Benign neoplasm of ascending colon: Secondary | ICD-10-CM | POA: Diagnosis not present

## 2013-11-23 DIAGNOSIS — Z86010 Personal history of colonic polyps: Secondary | ICD-10-CM | POA: Diagnosis not present

## 2013-11-23 DIAGNOSIS — D126 Benign neoplasm of colon, unspecified: Secondary | ICD-10-CM | POA: Diagnosis not present

## 2013-11-23 DIAGNOSIS — K635 Polyp of colon: Secondary | ICD-10-CM | POA: Diagnosis not present

## 2013-11-23 DIAGNOSIS — D123 Benign neoplasm of transverse colon: Secondary | ICD-10-CM | POA: Diagnosis not present

## 2013-12-02 DIAGNOSIS — Z23 Encounter for immunization: Secondary | ICD-10-CM | POA: Diagnosis not present

## 2013-12-03 DIAGNOSIS — N183 Chronic kidney disease, stage 3 (moderate): Secondary | ICD-10-CM | POA: Diagnosis not present

## 2013-12-03 DIAGNOSIS — R809 Proteinuria, unspecified: Secondary | ICD-10-CM | POA: Diagnosis not present

## 2013-12-03 DIAGNOSIS — I10 Essential (primary) hypertension: Secondary | ICD-10-CM | POA: Diagnosis not present

## 2013-12-03 DIAGNOSIS — N2581 Secondary hyperparathyroidism of renal origin: Secondary | ICD-10-CM | POA: Diagnosis not present

## 2013-12-14 ENCOUNTER — Ambulatory Visit: Payer: Self-pay

## 2013-12-14 DIAGNOSIS — Z1231 Encounter for screening mammogram for malignant neoplasm of breast: Secondary | ICD-10-CM | POA: Diagnosis not present

## 2013-12-22 ENCOUNTER — Ambulatory Visit: Payer: Self-pay | Admitting: Nephrology

## 2013-12-22 DIAGNOSIS — N183 Chronic kidney disease, stage 3 (moderate): Secondary | ICD-10-CM | POA: Diagnosis not present

## 2013-12-22 DIAGNOSIS — N281 Cyst of kidney, acquired: Secondary | ICD-10-CM | POA: Diagnosis not present

## 2013-12-22 DIAGNOSIS — N189 Chronic kidney disease, unspecified: Secondary | ICD-10-CM | POA: Diagnosis not present

## 2014-01-07 DIAGNOSIS — I10 Essential (primary) hypertension: Secondary | ICD-10-CM | POA: Diagnosis not present

## 2014-01-07 DIAGNOSIS — J019 Acute sinusitis, unspecified: Secondary | ICD-10-CM | POA: Diagnosis not present

## 2014-01-07 DIAGNOSIS — D631 Anemia in chronic kidney disease: Secondary | ICD-10-CM | POA: Diagnosis not present

## 2014-01-07 DIAGNOSIS — R809 Proteinuria, unspecified: Secondary | ICD-10-CM | POA: Diagnosis not present

## 2014-01-07 DIAGNOSIS — N2581 Secondary hyperparathyroidism of renal origin: Secondary | ICD-10-CM | POA: Diagnosis not present

## 2014-01-07 DIAGNOSIS — N183 Chronic kidney disease, stage 3 (moderate): Secondary | ICD-10-CM | POA: Diagnosis not present

## 2014-01-07 DIAGNOSIS — R52 Pain, unspecified: Secondary | ICD-10-CM | POA: Diagnosis not present

## 2014-03-04 DIAGNOSIS — Z79891 Long term (current) use of opiate analgesic: Secondary | ICD-10-CM | POA: Diagnosis not present

## 2014-04-01 DIAGNOSIS — R52 Pain, unspecified: Secondary | ICD-10-CM | POA: Diagnosis not present

## 2014-04-08 DIAGNOSIS — R809 Proteinuria, unspecified: Secondary | ICD-10-CM | POA: Diagnosis not present

## 2014-04-08 DIAGNOSIS — D631 Anemia in chronic kidney disease: Secondary | ICD-10-CM | POA: Diagnosis not present

## 2014-04-08 DIAGNOSIS — N2581 Secondary hyperparathyroidism of renal origin: Secondary | ICD-10-CM | POA: Diagnosis not present

## 2014-04-08 DIAGNOSIS — N183 Chronic kidney disease, stage 3 (moderate): Secondary | ICD-10-CM | POA: Diagnosis not present

## 2014-04-08 DIAGNOSIS — I10 Essential (primary) hypertension: Secondary | ICD-10-CM | POA: Diagnosis not present

## 2014-04-21 DIAGNOSIS — H02831 Dermatochalasis of right upper eyelid: Secondary | ICD-10-CM | POA: Diagnosis not present

## 2014-04-21 DIAGNOSIS — H02834 Dermatochalasis of left upper eyelid: Secondary | ICD-10-CM | POA: Diagnosis not present

## 2014-05-17 DIAGNOSIS — H02831 Dermatochalasis of right upper eyelid: Secondary | ICD-10-CM | POA: Diagnosis not present

## 2014-05-17 DIAGNOSIS — H02834 Dermatochalasis of left upper eyelid: Secondary | ICD-10-CM | POA: Diagnosis not present

## 2014-05-17 DIAGNOSIS — L908 Other atrophic disorders of skin: Secondary | ICD-10-CM | POA: Diagnosis not present

## 2014-05-24 ENCOUNTER — Ambulatory Visit
Admit: 2014-05-24 | Disposition: A | Discharge status: Home / Self Care | Payer: Self-pay | Attending: Ophthalmology | Admitting: Ophthalmology

## 2014-05-24 DIAGNOSIS — E780 Pure hypercholesterolemia: Secondary | ICD-10-CM | POA: Diagnosis not present

## 2014-05-24 DIAGNOSIS — H02834 Dermatochalasis of left upper eyelid: Secondary | ICD-10-CM | POA: Diagnosis not present

## 2014-05-24 DIAGNOSIS — H02831 Dermatochalasis of right upper eyelid: Secondary | ICD-10-CM | POA: Diagnosis not present

## 2014-05-24 DIAGNOSIS — Z7951 Long term (current) use of inhaled steroids: Secondary | ICD-10-CM | POA: Diagnosis not present

## 2014-05-24 DIAGNOSIS — Z7982 Long term (current) use of aspirin: Secondary | ICD-10-CM | POA: Diagnosis not present

## 2014-05-24 DIAGNOSIS — I509 Heart failure, unspecified: Secondary | ICD-10-CM | POA: Diagnosis not present

## 2014-05-24 DIAGNOSIS — M810 Age-related osteoporosis without current pathological fracture: Secondary | ICD-10-CM | POA: Diagnosis not present

## 2014-05-24 DIAGNOSIS — L908 Other atrophic disorders of skin: Secondary | ICD-10-CM | POA: Diagnosis not present

## 2014-05-24 DIAGNOSIS — J449 Chronic obstructive pulmonary disease, unspecified: Secondary | ICD-10-CM | POA: Diagnosis not present

## 2014-05-24 DIAGNOSIS — M160 Bilateral primary osteoarthritis of hip: Secondary | ICD-10-CM | POA: Diagnosis not present

## 2014-05-24 DIAGNOSIS — I209 Angina pectoris, unspecified: Secondary | ICD-10-CM | POA: Diagnosis not present

## 2014-05-24 DIAGNOSIS — F329 Major depressive disorder, single episode, unspecified: Secondary | ICD-10-CM | POA: Diagnosis not present

## 2014-05-24 DIAGNOSIS — K219 Gastro-esophageal reflux disease without esophagitis: Secondary | ICD-10-CM | POA: Diagnosis not present

## 2014-05-24 DIAGNOSIS — Z91048 Other nonmedicinal substance allergy status: Secondary | ICD-10-CM | POA: Diagnosis not present

## 2014-05-24 DIAGNOSIS — M879 Osteonecrosis, unspecified: Secondary | ICD-10-CM | POA: Diagnosis not present

## 2014-05-24 DIAGNOSIS — Z79899 Other long term (current) drug therapy: Secondary | ICD-10-CM | POA: Diagnosis not present

## 2014-05-24 DIAGNOSIS — K279 Peptic ulcer, site unspecified, unspecified as acute or chronic, without hemorrhage or perforation: Secondary | ICD-10-CM | POA: Diagnosis not present

## 2014-05-24 DIAGNOSIS — Z96649 Presence of unspecified artificial hip joint: Secondary | ICD-10-CM | POA: Diagnosis not present

## 2014-05-24 DIAGNOSIS — I10 Essential (primary) hypertension: Secondary | ICD-10-CM | POA: Diagnosis not present

## 2014-05-24 DIAGNOSIS — Z87891 Personal history of nicotine dependence: Secondary | ICD-10-CM | POA: Diagnosis not present

## 2014-05-24 DIAGNOSIS — H02403 Unspecified ptosis of bilateral eyelids: Secondary | ICD-10-CM | POA: Diagnosis not present

## 2014-05-27 DIAGNOSIS — L239 Allergic contact dermatitis, unspecified cause: Secondary | ICD-10-CM | POA: Diagnosis not present

## 2014-06-11 NOTE — Consult Note (Signed)
PATIENT NAME:  Amy Rocha, Amy Rocha MR#:  [id number] DATE OF BIRTH:  [birth year]  DATE OF CONSULTATION:  09/14/2013  REFERRING PHYSICIAN:   CONSULTING PHYSICIAN:  Gonzella Lex, MD  IDENTIFYING INFORMATION AND REASON FOR CONSULTATION: A 59 year old woman with a history of cardiac problems, chronic pain, and chronic obstructive pulmonary disease, who presented to the Emergency Room with complaints of visual and auditory hallucinations from this morning. Consultation to rule out psychiatric component.   HISTORY OF PRESENT ILLNESS: Information obtained from the patient, from the Emergency Room physician, and from review of the chart. Patient states that this morning she was going about her usual business. She and her husband went shopping. While she was out shopping, she had an episode of feeling sweaty and dizzy and had to sit down for a little bit. After she got back home, she started to have perceptual disturbances. She describes an episode of thinking that her dog was moving an object across floor that he really was not, also says that the pictures on the wall looked like they were moving up and down. Also says that she was thinking that she was having a conversation with her son on the phone, which she actually was not doing. The patient denies that she has had any mood symptoms. Denies any other psychotic symptoms. She absolutely denies that she has had any change in any of her recent medications. She says she has been taking everything that she is on right now stably and has not missed any doses. She also says that her amitriptyline and Ativan, which she considers p.r.n. are medicines that she has not taken in a long time. She is neither acutely on them nor withdrawing from them. She tells me that she does not eat breakfast in the morning ever. She cannot recall if she had anything to drink this morning. She does not have any other physical symptoms right now.   PAST PSYCHIATRIC HISTORY: About 3 years ago  she had a hospitalization for a syncopal episode. It was ultimately thought to probably be related to dehydration. There is no history of any mental illness.  No history of suicidal behavior. I should say that she does have a history of chronic depression for which she has been taking bupropion and Cymbalta chronically, but she minimizes this.  Never been in a hospital. No history of suicidality and no recent symptoms.   SOCIAL HISTORY: Lives at home with her husband. No complaints of any recent stress.   CURRENT MEDICATIONS: Oxycodone 20 mg 3 times a day, bupropion unknown dose, Cymbalta 60 mg a day, Diovan daily, metoprolol daily, Crestor daily. Takes p.r.n. amitriptyline and Ativan. She cannot remember any other medicines right now.   ALLERGIES: SHE IS ALLERGIC TO ADVAIR DISKUS.   PRIMARY CARE DOCTOR: Dr. Rance Muir office.   REVIEW OF SYSTEMS: As noted above this morning, she had some perceptual disturbances. Right now, she denies any symptoms. Denies suicidal or homicidal ideation. Denies hallucinations. Denies mood symptoms.  Not feeling sick or dizzy.  Really no symptoms at all right now.   MENTAL STATUS EXAM:  Casually dressed woman, looks her stated age, cooperative with the interview. Good eye contact, normal psychomotor activity. Speech normal rate, tone and volume. Affect slightly blunted, but not severely so. Mood stated as okay. Thoughts appear to be generally lucid. No sign of loosening of associations or delusions. Denies auditory or visual hallucinations. Denies suicidal or homicidal ideation. Short and long-term memory grossly intact. Appears to have normal  intelligence.   LABORATORY RESULTS: Her drug screen is positive for MDMA, but I think that is probably due to the Wellbutrin and that is a common cross reaction. She has an elevated hematocrit, possibly from her chronic COPD. She has a drug screen, as I mentioned, that has the MDMA, but is otherwise negative. Really nothing else  remarkable here.   VITAL SIGNS: I do note that her pulse has been elevated throughout her hospital stay here.  It was 114 while we were talking. Blood pressure was also elevated.   ASSESSMENT: This is a 59 year old woman who presents with an episode this morning of perceptual disturbances that happened around the same time that she was having dizziness and lightheadedness and sweating. She has a past history of syncopal episodes. The symptoms that she is describing are not typical of psychiatric illness, but of transient neurologic event such as hypoxia that might come from tachycardia or dehydration. There is no sign here of depression. No sign of psychosis. No sign at all of any dangerousness to self or others. There is no indication that she is abusing drugs or has had any specific problems from her medicine.   TREATMENT PLAN: I advised the patient that I do not think she needs to be in the hospital. Dr. Thomasene Lot can talk with her about the rest of the ER plan. I advised her to follow up with her primary care doctor's office, who can check her blood pressure regularly and talk with her about her medications and review her overall treatment plan. I advised her that if these symptoms recur, I would advise she lied down and get something to drink and see if they clear up, but if the symptoms are getting worse or are more persistent or are upsetting her to a greater degree, she can certainly call her doctor or come back to the hospital.   DIAGNOSIS, PRINCIPAL AND PRIMARY:  AXIS I: Transient delirium due to medical problems.   SECONDARY DIAGNOSES:  AXIS I: History of depression, not otherwise specified.  AXIS II: Deferred.  AXIS III: Multiple medical problems, high blood pressure, chronic obstructive pulmonary disease, chronic pain.  AXIS IV: Mild to moderate.  AXIS V: Functioning at time of evaluation 58.     ____________________________ Gonzella Lex, MD jtc:ts D: 09/14/2013 16:37:15  ET T: 09/14/2013 16:46:36 ET JOB#: [id number]  cc: Gonzella Lex, MD, <Dictator> Gonzella Lex MD ELECTRONICALLY SIGNED 09/29/2013 0:38

## 2014-06-24 DIAGNOSIS — I110 Hypertensive heart disease with heart failure: Secondary | ICD-10-CM | POA: Diagnosis not present

## 2014-06-24 DIAGNOSIS — R52 Pain, unspecified: Secondary | ICD-10-CM | POA: Diagnosis not present

## 2014-06-24 DIAGNOSIS — N184 Chronic kidney disease, stage 4 (severe): Secondary | ICD-10-CM | POA: Diagnosis not present

## 2014-07-08 DIAGNOSIS — Z96641 Presence of right artificial hip joint: Secondary | ICD-10-CM | POA: Diagnosis not present

## 2014-07-08 DIAGNOSIS — M4726 Other spondylosis with radiculopathy, lumbar region: Secondary | ICD-10-CM | POA: Diagnosis not present

## 2014-07-08 DIAGNOSIS — M25551 Pain in right hip: Secondary | ICD-10-CM | POA: Diagnosis not present

## 2014-07-11 ENCOUNTER — Other Ambulatory Visit: Payer: Self-pay | Admitting: Surgery

## 2014-07-11 DIAGNOSIS — M25551 Pain in right hip: Secondary | ICD-10-CM

## 2014-07-15 ENCOUNTER — Ambulatory Visit: Admission: RE | Admit: 2014-07-15 | Payer: Self-pay | Source: Ambulatory Visit

## 2014-08-12 ENCOUNTER — Other Ambulatory Visit: Payer: Self-pay | Admitting: Unknown Physician Specialty

## 2014-08-12 MED ORDER — OXYMORPHONE HCL ER 20 MG PO T12A: 1.0000 | EXTENDED_RELEASE_TABLET | Freq: Three times a day (TID) | ORAL | Status: DC

## 2014-08-12 MED ORDER — OXYCODONE-ACETAMINOPHEN 10-325 MG PO TABS: 1.0000 | ORAL_TABLET | Freq: Four times a day (QID) | ORAL | Status: DC | PRN

## 2014-08-15 ENCOUNTER — Other Ambulatory Visit: Payer: Self-pay | Admitting: Unknown Physician Specialty

## 2014-09-08 DIAGNOSIS — I509 Heart failure, unspecified: Secondary | ICD-10-CM

## 2014-09-08 DIAGNOSIS — G8929 Other chronic pain: Secondary | ICD-10-CM

## 2014-09-08 DIAGNOSIS — F419 Anxiety disorder, unspecified: Secondary | ICD-10-CM

## 2014-09-08 DIAGNOSIS — K589 Irritable bowel syndrome without diarrhea: Secondary | ICD-10-CM | POA: Insufficient documentation

## 2014-09-08 DIAGNOSIS — I129 Hypertensive chronic kidney disease with stage 1 through stage 4 chronic kidney disease, or unspecified chronic kidney disease: Secondary | ICD-10-CM

## 2014-09-08 DIAGNOSIS — J449 Chronic obstructive pulmonary disease, unspecified: Secondary | ICD-10-CM

## 2014-09-08 DIAGNOSIS — N3941 Urge incontinence: Secondary | ICD-10-CM

## 2014-09-08 DIAGNOSIS — I2510 Atherosclerotic heart disease of native coronary artery without angina pectoris: Secondary | ICD-10-CM | POA: Insufficient documentation

## 2014-09-08 DIAGNOSIS — M8700 Idiopathic aseptic necrosis of unspecified bone: Secondary | ICD-10-CM | POA: Insufficient documentation

## 2014-09-08 DIAGNOSIS — I25119 Atherosclerotic heart disease of native coronary artery with unspecified angina pectoris: Secondary | ICD-10-CM

## 2014-09-08 DIAGNOSIS — F32A Depression, unspecified: Secondary | ICD-10-CM

## 2014-09-08 DIAGNOSIS — J45909 Unspecified asthma, uncomplicated: Secondary | ICD-10-CM

## 2014-09-08 DIAGNOSIS — N200 Calculus of kidney: Secondary | ICD-10-CM | POA: Insufficient documentation

## 2014-09-08 DIAGNOSIS — R809 Proteinuria, unspecified: Secondary | ICD-10-CM

## 2014-09-08 DIAGNOSIS — D649 Anemia, unspecified: Secondary | ICD-10-CM | POA: Insufficient documentation

## 2014-09-08 DIAGNOSIS — K219 Gastro-esophageal reflux disease without esophagitis: Secondary | ICD-10-CM

## 2014-09-08 DIAGNOSIS — G894 Chronic pain syndrome: Secondary | ICD-10-CM | POA: Insufficient documentation

## 2014-09-08 DIAGNOSIS — N184 Chronic kidney disease, stage 4 (severe): Secondary | ICD-10-CM

## 2014-09-08 DIAGNOSIS — F329 Major depressive disorder, single episode, unspecified: Secondary | ICD-10-CM

## 2014-09-08 DIAGNOSIS — I110 Hypertensive heart disease with heart failure: Secondary | ICD-10-CM | POA: Insufficient documentation

## 2014-09-08 DIAGNOSIS — E785 Hyperlipidemia, unspecified: Secondary | ICD-10-CM

## 2014-09-08 DIAGNOSIS — M2550 Pain in unspecified joint: Secondary | ICD-10-CM

## 2014-09-13 ENCOUNTER — Ambulatory Visit (INDEPENDENT_AMBULATORY_CARE_PROVIDER_SITE_OTHER): Payer: Medicare Other | Admitting: Unknown Physician Specialty

## 2014-09-13 ENCOUNTER — Encounter: Payer: Self-pay | Admitting: Unknown Physician Specialty

## 2014-09-13 VITALS — BP 131/89 | HR 104 | Temp 98.1°F | Ht 64.4 in | Wt 154.6 lb

## 2014-09-13 DIAGNOSIS — I25119 Atherosclerotic heart disease of native coronary artery with unspecified angina pectoris: Secondary | ICD-10-CM

## 2014-09-13 DIAGNOSIS — G8929 Other chronic pain: Secondary | ICD-10-CM

## 2014-09-13 MED ORDER — OXYMORPHONE HCL ER 15 MG PO T12A: 15.0000 mg | EXTENDED_RELEASE_TABLET | Freq: Three times a day (TID) | ORAL | Status: DC

## 2014-09-13 MED ORDER — OXYCODONE-ACETAMINOPHEN 10-325 MG PO TABS: 1.0000 | ORAL_TABLET | Freq: Four times a day (QID) | ORAL | Status: DC | PRN

## 2014-09-13 NOTE — Progress Notes (Signed)
BP 131/89 mmHg  Pulse 104  Temp(Src) 98.1 F (36.7 C)  Ht 5' 4.4" (1.636 m)  Wt 154 lb 9.6 oz (70.126 kg)  BMI 26.20 kg/m2  LMP 09/13/1975 (Approximate)   Subjective:    Patient ID: Amy Rocha, female    DOB: May 21, 1955, 59 y.o.   MRN: [id number]  HPI: Amy Rocha is a 59 y.o. female  Chief Complaint  Patient presents with  . Pain  . Medication Refill    Relevant past medical, surgical, family and social history reviewed and updated as indicated. Interim medical history since our last visit reviewed. Allergies and medications reviewed and updated.  1.  CHRONIC PAIN   Patient requesting refill of pain medication.  Multiple hip surgeries and 4 replacments in left hip following failed revascularization.    Last surgery was 2003.  Right hip is beginning to fail and has an appointment with Orthopedics  Pt's current pain regimen is following recommendations from [facility].  That physician is no longer there.   I referred her to a local pain doctor however, in the past he put a stimulator in and almost died with a staph infection and therefore not comfortable with him.    Pain control status:  stable  H6 What Activities task can be accomplished with current medication?  States that having pain medictions allow her to do housework, play with grand daughter,  go shopping.  Unable now to walk around the block which she would like to do.   Benefit from narcotic medications:  yes  H6   Interested in weaning off narcotics:  no H6   Previous pain specialty evaluation:  yes  H6 Non-narcotic analgesic meds :  yes  H6   Narcotic contract:  yes  H6     Patient requesting refill of pain medication. She has a prescription for a Naloxone inhaler.  She never got it refilled due to insurance concerns.  However, I asked her to check again with new laws.    Pain control status:  controlled  H6 Breakthrough pain:  no  H5    Review of Systems  Constitutional: Negative.   HENT:  Negative.   Eyes: Negative.   Respiratory: Negative.   Cardiovascular: Negative.   Gastrointestinal: Negative.   Endocrine: Negative.   Genitourinary: Negative.   Musculoskeletal: Negative.   Skin: Negative.   Allergic/Immunologic: Negative.   Neurological: Negative.   Hematological: Negative.   Psychiatric/Behavioral: Negative.     Per HPI unless specifically indicated above     Objective:    BP 131/89 mmHg  Pulse 104  Temp(Src) 98.1 F (36.7 C)  Ht 5' 4.4" (1.636 m)  Wt 154 lb 9.6 oz (70.126 kg)  BMI 26.20 kg/m2  LMP 09/13/1975 (Approximate)  Wt Readings from Last 3 Encounters:  09/13/14 154 lb 9.6 oz (70.126 kg)  06/24/14 154 lb (69.854 kg)    Physical Exam  Constitutional: She is oriented to person, place, and time. She appears well-developed and well-nourished. No distress.  HENT:  Head: Normocephalic and atraumatic.  Eyes: Conjunctivae and lids are normal. Right eye exhibits no discharge. Left eye exhibits no discharge. No scleral icterus.  Cardiovascular: Normal rate and regular rhythm.   Pulmonary/Chest: Effort normal. No respiratory distress.  Abdominal: Normal appearance and bowel sounds are normal. There is no splenomegaly or hepatomegaly.  Musculoskeletal: Normal range of motion.  Neurological: She is alert and oriented to person, place, and time.  Skin: Skin is intact. No rash  noted. No pallor.  Psychiatric: She has a normal mood and affect. Her behavior is normal. Judgment and thought content normal.  Vitals reviewed.     Assessment & Plan:   Problem List Items Addressed This Visit      Unprioritized   Chronic pain - Primary   Relevant Medications   Oxymorphone ER, Crush Resist, 15 MG T12A   Other Relevant Orders   Ambulatory referral to Pain Clinic       Follow up plan: Return in about 3 months (around 12/14/2014).   Discussed again that she needs to see a pain specialist.  For now, we will decrease her Oxymorphone to 15 mg TID. Discussed  we will need to taper and come down a  little more every month.  She will fill her Naloxone inhaler.   We will refer her to Dr. Josefa Half at the [facility] pain chlic

## 2014-09-14 ENCOUNTER — Ambulatory Visit: Payer: Medicare Other | Admitting: Unknown Physician Specialty

## 2014-09-16 ENCOUNTER — Ambulatory Visit: Payer: Medicare Other | Admitting: Unknown Physician Specialty

## 2014-10-07 ENCOUNTER — Other Ambulatory Visit: Payer: Self-pay | Admitting: Unknown Physician Specialty

## 2014-10-07 MED ORDER — OXYMORPHONE HCL ER 10 MG PO T12A: 10.0000 mg | EXTENDED_RELEASE_TABLET | Freq: Three times a day (TID) | ORAL | Status: DC

## 2014-10-07 MED ORDER — OXYCODONE-ACETAMINOPHEN 10-325 MG PO TABS: 1.0000 | ORAL_TABLET | Freq: Four times a day (QID) | ORAL | Status: DC | PRN

## 2014-10-07 NOTE — Progress Notes (Unsigned)
Tapering narcotic prescriptions.  Decrease Oxymorphone ER TID from 15 mg to 10 mg

## 2014-10-26 ENCOUNTER — Encounter: Payer: Self-pay | Admitting: Pain Medicine

## 2014-10-26 ENCOUNTER — Encounter (INDEPENDENT_AMBULATORY_CARE_PROVIDER_SITE_OTHER): Payer: Self-pay

## 2014-10-26 ENCOUNTER — Ambulatory Visit: Payer: Medicare Other | Attending: Pain Medicine | Admitting: Pain Medicine

## 2014-10-26 VITALS — BP 105/92 | HR 93 | Temp 97.5°F | Resp 14 | Ht 65.0 in | Wt 154.0 lb

## 2014-10-26 DIAGNOSIS — M533 Sacrococcygeal disorders, not elsewhere classified: Secondary | ICD-10-CM | POA: Insufficient documentation

## 2014-10-26 DIAGNOSIS — M87052 Idiopathic aseptic necrosis of left femur: Secondary | ICD-10-CM | POA: Diagnosis not present

## 2014-10-26 DIAGNOSIS — M79605 Pain in left leg: Secondary | ICD-10-CM | POA: Diagnosis present

## 2014-10-26 DIAGNOSIS — M79604 Pain in right leg: Secondary | ICD-10-CM | POA: Diagnosis present

## 2014-10-26 DIAGNOSIS — M5136 Other intervertebral disc degeneration, lumbar region: Secondary | ICD-10-CM | POA: Diagnosis not present

## 2014-10-26 DIAGNOSIS — N200 Calculus of kidney: Secondary | ICD-10-CM | POA: Insufficient documentation

## 2014-10-26 DIAGNOSIS — M545 Low back pain: Secondary | ICD-10-CM | POA: Diagnosis present

## 2014-10-26 DIAGNOSIS — G90523 Complex regional pain syndrome I of lower limb, bilateral: Secondary | ICD-10-CM | POA: Insufficient documentation

## 2014-10-26 DIAGNOSIS — M47817 Spondylosis without myelopathy or radiculopathy, lumbosacral region: Secondary | ICD-10-CM | POA: Diagnosis not present

## 2014-10-26 DIAGNOSIS — M87051 Idiopathic aseptic necrosis of right femur: Secondary | ICD-10-CM | POA: Diagnosis not present

## 2014-10-26 DIAGNOSIS — Z96643 Presence of artificial hip joint, bilateral: Secondary | ICD-10-CM

## 2014-10-26 DIAGNOSIS — Z96649 Presence of unspecified artificial hip joint: Secondary | ICD-10-CM | POA: Insufficient documentation

## 2014-10-26 DIAGNOSIS — M47816 Spondylosis without myelopathy or radiculopathy, lumbar region: Secondary | ICD-10-CM | POA: Insufficient documentation

## 2014-10-26 DIAGNOSIS — M169 Osteoarthritis of hip, unspecified: Secondary | ICD-10-CM | POA: Diagnosis not present

## 2014-10-26 DIAGNOSIS — M879 Osteonecrosis, unspecified: Secondary | ICD-10-CM | POA: Insufficient documentation

## 2014-10-26 NOTE — Patient Instructions (Addendum)
PLAN   Continue present medications at this time. We will request permission from [facility] to prescribe medications for treatment of your pain  F/U PCP C Wicker   for evaliation of  BP and general medical  condition  F/U surgical evaluation. May consider pending follow-up evaluations  F/U neurological evaluation. May consider PNCV/EMG studies and other studies  pending follow-up evaluations  Please ask Caryl Pina the date of your lumbar MRI. Please call to be sure that I have your lumbar MRI report before you come for your appointment   Vascular evaluation. May consider pending further evaluation as discussed  May consider radiofrequency rhizolysis or intraspinal procedures pending response to present treatment and F/U evaluation   Patient to call Pain Management Center should patient have concerns prior to scheduled return appointment.

## 2014-10-26 NOTE — Progress Notes (Signed)
Subjective:    Patient ID: Amy Rocha, female    DOB: 05/13/1955, 59 y.o.   MRN: [id number]  HPI Patient is 59 year old female who comes to pain management Center at the request of Kathrine Haddock for further evaluation and treatment of pain involving the lower back and lower extremity regions. Patient is with history of avascular glucoses of the hips with bilateral total hip replacements. Patient is undergone hip replacement on the left 4 and hip replacement on the right 1. Patient states that in 1998 she was hospitalized for pneumonia. Patient states she was given intravenous steroids during hospitalization and one month later she developed severe pain. Patient underwent physical therapy and eventually was evaluated by orthopedic surgeon in [facility] who diagnosed avascular necrosis of the hips. Patient underwent surgery of the hips by Dr. Humphrey Rolls at [facility] orthopedic surgery. Patient stated that her first surgery was in 1999 and her last surgery was in 2003. Also stated that she had spinal cord stimulator inserted by Dr.Naveria which was complicated by severe infection. Patient stated that she became extremely ill and that the spinal cord stimulator had to be explanted. Patient stated that Dr. Dossie Arbour wanted to insert a note the spinal cord stimulator. The patient stated that she informed Dr. Dossie Arbour  that she did not wish to have another spinal cord stimulator inserted due to the previous complications from the initial spinal cord stimulator. We discussed patient's condition and informed patient of present time we would prefer to avoid interventional treatment of her condition. Patient stated that her pain was an aching disabling sensation described as sharp shooting sickening tender throbbing sensation. Patient admits to her feet feeling cold at times. Patient admitted to pain awakening her from sleep and interferes with her ability to go to sleep. Patient ability weakness numbness swelling of the  extremities. Patient stated the pain increases with bending climbing kneeling motion standing squatting stooping walking area patient states the pain decreased with sitting and lying down and with medications. We informed patient that we wish to update her lumbar MRI and that we would also request permission from her prescribing physician to prescribe medications for treatment of her pain. We will obtain lumbar MRI and we'll schedule patient for return appointment to discuss treatment regimen. The patient was in agreement status treatment plan        Review of Systems   Cardiovascular Daily aspirin intake  High blood pressure  Pulmonary Lung problems  Neurological Incontinence  Psychological Depression  Gastrointestinal Unremarkable  Genitourinary Kidney stones Kidney disease Blood in urine  Hematological Easy bleeding (Patient denies bleeding disorder)  Endocrine Unremarkable  Rheumatological Unremarkable  Other significant Problems with anesthesia ( Patient was in coma for 34 days following her initial total hip replacement due to aspiration)           Objective:   Physical Exam   there was tenderness to palpation splenius capitis occipitalis region of mild degree. No new masses of the head and neck were noted. There was mild tends to palpation of the cervical facet cervical paraspinal musculature region. There was mild tinnitus of the thoracic facet thoracic paraspinal musculature region. There appeared to be unremarkable Spurling's maneuver. Patient appeared to be with bilaterally equal grip strength. Tinel and Phalen's maneuver without increased pain of significant degree. There was well-healed surgical scar of the thoracic region. There was well-healed surgical scar of the gluteal musculature region on the right. (Patient status post infection of spinal cord stimulator requiring explantation of  spinal cord stimulator and healing by secondary intention) there  was tends to palpation over the lumbar paraspinal muscles lumbar facet region a moderate degree. Lateral bending rotation extension and palpation lumbar facets reproduce moderate discomfort. There was tends to palpation over the PSIS and PII S region as well as the gluteal and piriformis musculature region of moderate degree. Well-healed surgical scars were noted left hip right hip as well as along the iliotibial band region. There was negative clonus negative Homans. Straight leg raising tolerates approximately 30 without a definite increase of pain with dorsiflexion noted. EHL strength appeared to be decreased. Abdomen was nontender and no costovertebral angle tenderness was noted.      Assessment & Plan:   Degenerative disc disease lumbar spine  Lumbar facet syndrome  Sacroiliac joint dysfunction  Status post bilateral total hip replacement  Avascular necrosis of hips  Status post spinal cord stimulator placement complicated by infection requiring explantation of spinal cord stimulator and healing by secondary intention  Nephrolithiasis  Complex regional pain syndrome of the lower extremities    PLAN   Continue present medications at this time.. We will request permission to prescribe medications for treatment of patient's pain   F/U PCP  C Wicker  for evaliation of  BP and general medical  condition  F/U surgical evaluation. May consider pending follow-up evaluations  F/U neurological evaluation. May consider PNCV/EMG studies and other studies pending follow-up evaluation  May consider vascular evaluation  Lumbar MRI. We will obtain lumbar MRI at this time to evaluate for degenerative disc disease, stenosis, sacroiliac joint disease and other abnormalities which may be contributing to patient's symptomatology  May consider radiofrequency rhizolysis or intraspinal procedures pending response to present treatment and F/U evaluation At the present time we will avoid  considering intraspinal implantation devices due to patient's severe infection following spinal cord stimulator placement by Dr.Naveira requiring explantation of spinal cord stimulator and healing by secondary intention.  Patient to call Pain Management Center should patient have concerns prior to scheduled return appointment.

## 2014-10-26 NOTE — Progress Notes (Signed)
Safety precautions to be maintained throughout the outpatient stay will include: orient to surroundings, keep bed in low position, maintain call bell within reach at all times, provide assistance with transfer out of bed and ambulation.  Discharged ambulatory at 2:20 pm

## 2014-10-30 DIAGNOSIS — J069 Acute upper respiratory infection, unspecified: Secondary | ICD-10-CM | POA: Diagnosis not present

## 2014-10-30 DIAGNOSIS — R05 Cough: Secondary | ICD-10-CM | POA: Diagnosis not present

## 2014-11-01 DIAGNOSIS — N2581 Secondary hyperparathyroidism of renal origin: Secondary | ICD-10-CM | POA: Diagnosis not present

## 2014-11-01 DIAGNOSIS — E785 Hyperlipidemia, unspecified: Secondary | ICD-10-CM | POA: Diagnosis not present

## 2014-11-01 DIAGNOSIS — R809 Proteinuria, unspecified: Secondary | ICD-10-CM | POA: Diagnosis not present

## 2014-11-01 DIAGNOSIS — N183 Chronic kidney disease, stage 3 (moderate): Secondary | ICD-10-CM | POA: Diagnosis not present

## 2014-11-01 DIAGNOSIS — I10 Essential (primary) hypertension: Secondary | ICD-10-CM | POA: Diagnosis not present

## 2014-11-03 ENCOUNTER — Other Ambulatory Visit: Payer: Self-pay | Admitting: Unknown Physician Specialty

## 2014-11-03 NOTE — Telephone Encounter (Signed)
Last EKG reviewed; narrow QRS; Rx approved

## 2014-11-07 ENCOUNTER — Encounter: Payer: Self-pay | Admitting: Pain Medicine

## 2014-11-07 ENCOUNTER — Ambulatory Visit: Payer: Medicare Other | Admitting: Pain Medicine

## 2014-11-07 ENCOUNTER — Ambulatory Visit: Payer: Medicare Other | Attending: Pain Medicine | Admitting: Pain Medicine

## 2014-11-07 VITALS — BP 147/87 | HR 103 | Temp 96.4°F | Resp 18 | Ht 65.0 in | Wt 155.0 lb

## 2014-11-07 DIAGNOSIS — M87051 Idiopathic aseptic necrosis of right femur: Secondary | ICD-10-CM | POA: Diagnosis not present

## 2014-11-07 DIAGNOSIS — M87052 Idiopathic aseptic necrosis of left femur: Secondary | ICD-10-CM | POA: Diagnosis not present

## 2014-11-07 DIAGNOSIS — Z96643 Presence of artificial hip joint, bilateral: Secondary | ICD-10-CM

## 2014-11-07 DIAGNOSIS — M169 Osteoarthritis of hip, unspecified: Secondary | ICD-10-CM | POA: Diagnosis not present

## 2014-11-07 DIAGNOSIS — M879 Osteonecrosis, unspecified: Secondary | ICD-10-CM | POA: Diagnosis not present

## 2014-11-07 DIAGNOSIS — M79604 Pain in right leg: Secondary | ICD-10-CM | POA: Diagnosis present

## 2014-11-07 DIAGNOSIS — M47817 Spondylosis without myelopathy or radiculopathy, lumbosacral region: Secondary | ICD-10-CM | POA: Diagnosis not present

## 2014-11-07 DIAGNOSIS — M5136 Other intervertebral disc degeneration, lumbar region: Secondary | ICD-10-CM | POA: Diagnosis not present

## 2014-11-07 DIAGNOSIS — M79605 Pain in left leg: Secondary | ICD-10-CM | POA: Diagnosis present

## 2014-11-07 DIAGNOSIS — M533 Sacrococcygeal disorders, not elsewhere classified: Secondary | ICD-10-CM | POA: Diagnosis not present

## 2014-11-07 DIAGNOSIS — N200 Calculus of kidney: Secondary | ICD-10-CM | POA: Insufficient documentation

## 2014-11-07 DIAGNOSIS — M545 Low back pain: Secondary | ICD-10-CM | POA: Diagnosis present

## 2014-11-07 DIAGNOSIS — M51369 Other intervertebral disc degeneration, lumbar region without mention of lumbar back pain or lower extremity pain: Secondary | ICD-10-CM

## 2014-11-07 DIAGNOSIS — M47816 Spondylosis without myelopathy or radiculopathy, lumbar region: Secondary | ICD-10-CM

## 2014-11-07 MED ORDER — OXYCODONE-ACETAMINOPHEN 10-325 MG PO TABS: ORAL_TABLET | ORAL | Status: DC

## 2014-11-07 MED ORDER — OXYMORPHONE HCL ER 10 MG PO T12A: EXTENDED_RELEASE_TABLET | ORAL | Status: DC

## 2014-11-07 NOTE — Progress Notes (Signed)
   Subjective:    Patient ID: Amy Rocha, female    DOB: 08-30-1955, 59 y.o.   MRN: [id number]  HPI Patient is 59 year old female returns to [facility] for further evaluation and treatment of pain involving the region of the lower back and lower extremity regions. Patient's pain is aggravated by standing walking twisting turning maneuvers. Patient admits to a pressure-like sensation of the lower back which becomes more intense as patient spends more time on the feet. We discussed patient's condition and will proceed with further studies of the lumbar lower extremity region and will continue present medications as prescribed at this time. Pending follow-up evaluation we will consider modifications of treatment regimen as discussed and as explained to patient on today's visit. We'll continue oxymorphone and oxycodone acetaminophen as prescribed at this time. The patient is understanding and agreement status treatment plan. We discussed lumbar MRI and patient will obtain lumbar MRI and patient is able to do such. We will consider additional modifications of treatment pending follow-up evaluation as discussed with patient on today's visit. The patient was in agreement with suggested treatment plan     Review of Systems     Objective:   Physical Exam  There was tenderness of the splenius capitis and occipitalis musculature region palpation with reproduced mild discomfort there was mild tenderness of the acromioclavicular and glenohumeral joint regions. Patient appeared to be with unremarkable Spurling's maneuver. Patient appeared to be with bilaterally equal grip strength. Tinel and Phalen's maneuver were without increase of pain significant degree. There was tenderness over the cervical facet cervical paraspinal musculature region and the lumbar facet lumbar paraspinal musculature region of mild degree. Palpation over the thoracic facet thoracic paraspinal musculature region associated  with mild discomfort. With no crepitus of the thoracic region noted. There was tenderness over the lumbar paraspinal musculature region lumbar facet region palpation which reproduces moderate discomfort. Moderate tenderness to palpation was noted over the lumbar facets on the left as well as on the right area lateral bending and rotation and extension and palpation of the lumbar facets reproducing moderate discomfort. Straight leg raising was tolerates approximately 30 without a definite increased pain with dorsiflexion noted reevaluation was requested increase of pain with dorsiflexion noted. EHL strength appeared to be decreased and no definite sensory deficit of dermatomal distribution was detected. There was negative clonus negative Homans. There was moderate tenderness to palpation over the PSIS and PII S regions. He was nontender with no costovertebral angle tenderness noted.      Assessment & Plan:    Degenerative disc disease lumbar spine  Lumbar facet syndrome  Sacroiliac joint dysfunction  Status post bilateral total hip replacement  Avascular necrosis of hips  Status post spinal cord stimulator placement complicated by infection requiring explantation of spinal cord stimulator and healing by secondary intention  Nephrolithiasis    PLAN   Continue present medication oxycodone acetaminophen and oxymorphone extended-release  F/U PCP Dr. Jeananne Rama and Amy Rocha for evaliation of  BP and general medical  condition  F/U surgical evaluation. May consider pending follow-up evaluations  Lumbar MRI. Patient will undergo lumbar MRI when she is able to do such as discussed  F/U neurological evaluation. May consider pending follow-up evaluations  May consider radiofrequency rhizolysis or intraspinal procedures pending response to present treatment and F/U evaluation   Patient to call Pain Management Center should patient have concerns prior to scheduled return appointment.

## 2014-11-07 NOTE — Patient Instructions (Addendum)
PLAN   Continue present medication oxycodone acetaminophen and Opana extended release  F/U PCP Dr. Jeananne Rama and Julian Hy for evaliation of  BP and general medical  condition  Lumbar MRI. As discussed try  to get MRI of the lumbar spine when you can for further evaluation of your lower back and lower extremity pain  F/U surgical evaluation. May consider pending follow-up evaluations  F/U neurological evaluation. May consider pending follow-up evaluations  May consider radiofrequency rhizolysis or intraspinal procedures pending response to present treatment and F/U evaluation   Patient to call Pain Management Center should patient have concerns prior to scheduled return appointment.

## 2014-11-07 NOTE — Progress Notes (Signed)
Safety precautions to be maintained throughout the outpatient stay will include: orient to surroundings, keep bed in low position, maintain call bell within reach at all times, provide assistance with transfer out of bed and ambulation.  

## 2014-11-08 ENCOUNTER — Other Ambulatory Visit: Payer: Self-pay | Admitting: Unknown Physician Specialty

## 2014-11-08 MED ORDER — OXYMORPHONE HCL ER 10 MG PO T12A: EXTENDED_RELEASE_TABLET | ORAL | Status: DC

## 2014-11-08 MED ORDER — OXYCODONE-ACETAMINOPHEN 10-325 MG PO TABS: ORAL_TABLET | ORAL | Status: DC

## 2014-11-11 ENCOUNTER — Ambulatory Visit
Admission: RE | Admit: 2014-11-11 | Discharge: 2014-11-11 | Disposition: A | Discharge status: Home / Self Care | Payer: Medicare Other | Source: Ambulatory Visit | Attending: Pain Medicine | Admitting: Pain Medicine

## 2014-11-11 DIAGNOSIS — M87051 Idiopathic aseptic necrosis of right femur: Secondary | ICD-10-CM

## 2014-11-11 DIAGNOSIS — M5126 Other intervertebral disc displacement, lumbar region: Secondary | ICD-10-CM | POA: Diagnosis not present

## 2014-11-11 DIAGNOSIS — M47816 Spondylosis without myelopathy or radiculopathy, lumbar region: Secondary | ICD-10-CM

## 2014-11-11 DIAGNOSIS — Z96643 Presence of artificial hip joint, bilateral: Secondary | ICD-10-CM | POA: Insufficient documentation

## 2014-11-11 DIAGNOSIS — M4186 Other forms of scoliosis, lumbar region: Secondary | ICD-10-CM | POA: Insufficient documentation

## 2014-11-11 DIAGNOSIS — M5136 Other intervertebral disc degeneration, lumbar region: Secondary | ICD-10-CM | POA: Insufficient documentation

## 2014-11-11 DIAGNOSIS — M87052 Idiopathic aseptic necrosis of left femur: Secondary | ICD-10-CM

## 2014-11-11 DIAGNOSIS — M533 Sacrococcygeal disorders, not elsewhere classified: Secondary | ICD-10-CM

## 2014-11-11 DIAGNOSIS — M545 Low back pain: Secondary | ICD-10-CM | POA: Diagnosis present

## 2014-11-14 ENCOUNTER — Other Ambulatory Visit: Payer: Self-pay | Admitting: Pain Medicine

## 2014-11-14 NOTE — Progress Notes (Signed)
Patient notified of results.

## 2014-11-17 ENCOUNTER — Ambulatory Visit: Payer: Medicare Other | Admitting: Pain Medicine

## 2014-12-06 ENCOUNTER — Encounter: Payer: Self-pay | Admitting: Pain Medicine

## 2014-12-06 ENCOUNTER — Ambulatory Visit: Payer: Medicare Other | Attending: Pain Medicine | Admitting: Pain Medicine

## 2014-12-06 VITALS — BP 138/58 | HR 93 | Temp 98.0°F | Resp 14 | Ht 65.0 in | Wt 155.0 lb

## 2014-12-06 DIAGNOSIS — Z96643 Presence of artificial hip joint, bilateral: Secondary | ICD-10-CM

## 2014-12-06 DIAGNOSIS — M169 Osteoarthritis of hip, unspecified: Secondary | ICD-10-CM | POA: Diagnosis not present

## 2014-12-06 DIAGNOSIS — M5126 Other intervertebral disc displacement, lumbar region: Secondary | ICD-10-CM | POA: Insufficient documentation

## 2014-12-06 DIAGNOSIS — M47817 Spondylosis without myelopathy or radiculopathy, lumbosacral region: Secondary | ICD-10-CM | POA: Diagnosis not present

## 2014-12-06 DIAGNOSIS — M79604 Pain in right leg: Secondary | ICD-10-CM | POA: Diagnosis present

## 2014-12-06 DIAGNOSIS — M87051 Idiopathic aseptic necrosis of right femur: Secondary | ICD-10-CM | POA: Diagnosis not present

## 2014-12-06 DIAGNOSIS — M87052 Idiopathic aseptic necrosis of left femur: Secondary | ICD-10-CM | POA: Diagnosis not present

## 2014-12-06 DIAGNOSIS — M879 Osteonecrosis, unspecified: Secondary | ICD-10-CM | POA: Insufficient documentation

## 2014-12-06 DIAGNOSIS — M533 Sacrococcygeal disorders, not elsewhere classified: Secondary | ICD-10-CM

## 2014-12-06 DIAGNOSIS — M47816 Spondylosis without myelopathy or radiculopathy, lumbar region: Secondary | ICD-10-CM

## 2014-12-06 DIAGNOSIS — N200 Calculus of kidney: Secondary | ICD-10-CM | POA: Diagnosis not present

## 2014-12-06 DIAGNOSIS — M5136 Other intervertebral disc degeneration, lumbar region: Secondary | ICD-10-CM | POA: Diagnosis not present

## 2014-12-06 DIAGNOSIS — M545 Low back pain: Secondary | ICD-10-CM | POA: Diagnosis present

## 2014-12-06 DIAGNOSIS — M79605 Pain in left leg: Secondary | ICD-10-CM | POA: Diagnosis present

## 2014-12-06 DIAGNOSIS — M4186 Other forms of scoliosis, lumbar region: Secondary | ICD-10-CM | POA: Diagnosis not present

## 2014-12-06 MED ORDER — OXYCODONE-ACETAMINOPHEN 10-325 MG PO TABS: ORAL_TABLET | ORAL | Status: DC

## 2014-12-06 MED ORDER — OXYMORPHONE HCL ER 10 MG PO T12A: EXTENDED_RELEASE_TABLET | ORAL | Status: DC

## 2014-12-06 NOTE — Patient Instructions (Addendum)
PLAN   Continue present medications Cymbalta oxycodone acetaminophen and oxymorphone extended release   Lumbar facet, medial branch nerve blocks to be performed at time return appointment  F/U PCP C Wicker for evaliation of  BP and general medical  condition  F/U surgical evaluation. May consider pending follow-up evaluations  F/U neurological evaluation. May consider pending follow-up evaluations  May consider radiofrequency rhizolysis or intraspinal procedures pending response to present treatment and F/U evaluation   Patient to call Pain Management Center should patient have concerns prior to scheduled return appointment. Pain Management Discharge Instructions  General Discharge Instructions :  If you need to reach your doctor call: Monday-Friday 8:00 am - 4:00 pm at [phone] or toll free [phone].  After clinic hours [phone] to have operator reach doctor.  Bring all of your medication bottles to all your appointments in the pain clinic.  To cancel or reschedule your appointment with Pain Management please remember to call 24 hours in advance to avoid a fee.  Refer to the educational materials which you have been given on: General Risks, I had my Procedure. Discharge Instructions, Post Sedation.  Post Procedure Instructions:  The drugs you were given will stay in your system until tomorrow, so for the next 24 hours you should not drive, make any legal decisions or drink any alcoholic beverages.  You may eat anything you prefer, but it is better to start with liquids then soups and crackers, and gradually work up to solid foods.  Please notify your doctor immediately if you have any unusual bleeding, trouble breathing or pain that is not related to your normal pain.  Depending on the type of procedure that was done, some parts of your body may feel week and/or numb.  This usually clears up by tonight or the next day.  Walk with the use of an assistive device or  accompanied by an adult for the 24 hours.  You may use ice on the affected area for the first 24 hours.  Put ice in a Ziploc bag and cover with a towel and place against area 15 minutes on 15 minutes off.  You may switch to heat after 24 hours.GENERAL RISKS AND COMPLICATIONS  What are the risk, side effects and possible complications? Generally speaking, most procedures are safe.  However, with any procedure there are risks, side effects, and the possibility of complications.  The risks and complications are dependent upon the sites that are lesioned, or the type of nerve block to be performed.  The closer the procedure is to the spine, the more serious the risks are.  Great care is taken when placing the radio frequency needles, block needles or lesioning probes, but sometimes complications can occur. 1. Infection: Any time there is an injection through the skin, there is a risk of infection.  This is why sterile conditions are used for these blocks.  There are four possible types of infection. 1. Localized skin infection. 2. Central Nervous System Infection-This can be in the form of Meningitis, which can be deadly. 3. Epidural Infections-This can be in the form of an epidural abscess, which can cause pressure inside of the spine, causing compression of the spinal cord with subsequent paralysis. This would require an emergency surgery to decompress, and there are no guarantees that the patient would recover from the paralysis. 4. Discitis-This is an infection of the intervertebral discs.  It occurs in about 1% of discography procedures.  It is difficult to treat and it may lead  to surgery.        2. Pain: the needles have to go through skin and soft tissues, will cause soreness.       3. Damage to internal structures:  The nerves to be lesioned may be near blood vessels or    other nerves which can be potentially damaged.       4. Bleeding: Bleeding is more common if the patient is taking blood  thinners such as  aspirin, Coumadin, Ticiid, Plavix, etc., or if he/she have some genetic predisposition  such as hemophilia. Bleeding into the spinal canal can cause compression of the spinal  cord with subsequent paralysis.  This would require an emergency surgery to  decompress and there are no guarantees that the patient would recover from the  paralysis.       5. Pneumothorax:  Puncturing of a lung is a possibility, every time a needle is introduced in  the area of the chest or upper back.  Pneumothorax refers to free air around the  collapsed lung(s), inside of the thoracic cavity (chest cavity).  Another two possible  complications related to a similar event would include: Hemothorax and Chylothorax.   These are variations of the Pneumothorax, where instead of air around the collapsed  lung(s), you may have blood or chyle, respectively.       6. Spinal headaches: They may occur with any procedures in the area of the spine.       7. Persistent CSF (Cerebro-Spinal Fluid) leakage: This is a rare problem, but may occur  with prolonged intrathecal or epidural catheters either due to the formation of a fistulous  track or a dural tear.       8. Nerve damage: By working so close to the spinal cord, there is always a possibility of  nerve damage, which could be as serious as a permanent spinal cord injury with  paralysis.       9. Death:  Although rare, severe deadly allergic reactions known as "Anaphylactic  reaction" can occur to any of the medications used.      10. Worsening of the symptoms:  We can always make thing worse.  What are the chances of something like this happening? Chances of any of this occuring are extremely low.  By statistics, you have more of a chance of getting killed in a motor vehicle accident: while driving to the hospital than any of the above occurring .  Nevertheless, you should be aware that they are possibilities.  In general, it is similar to taking a shower.  Everybody knows  that you can slip, hit your head and get killed.  Does that mean that you should not shower again?  Nevertheless always keep in mind that statistics do not mean anything if you happen to be on the wrong side of them.  Even if a procedure has a 1 (one) in a 1,000,000 (million) chance of going wrong, it you happen to be that one..Also, keep in mind that by statistics, you have more of a chance of having something go wrong when taking medications.  Who should not have this procedure? If you are on a blood thinning medication (e.g. Coumadin, Plavix, see list of "Blood Thinners"), or if you have an active infection going on, you should not have the procedure.  If you are taking any blood thinners, please inform your physician.  How should I prepare for this procedure?  Do not eat or drink anything at least six  hours prior to the procedure.  Bring a driver with you .  It cannot be a taxi.  Come accompanied by an adult that can drive you back, and that is strong enough to help you if your legs get weak or numb from the local anesthetic.  Take all of your medicines the morning of the procedure with just enough water to swallow them.  If you have diabetes, make sure that you are scheduled to have your procedure done first thing in the morning, whenever possible.  If you have diabetes, take only half of your insulin dose and notify our nurse that you have done so as soon as you arrive at the clinic.  If you are diabetic, but only take blood sugar pills (oral hypoglycemic), then do not take them on the morning of your procedure.  You may take them after you have had the procedure.  Do not take aspirin or any aspirin-containing medications, at least eleven (11) days prior to the procedure.  They may prolong bleeding.  Wear loose fitting clothing that may be easy to take off and that you would not mind if it got stained with Betadine or blood.  Do not wear any jewelry or perfume  Remove any nail  coloring.  It will interfere with some of our monitoring equipment.  NOTE: Remember that this is not meant to be interpreted as a complete list of all possible complications.  Unforeseen problems may occur.  BLOOD THINNERS The following drugs contain aspirin or other products, which can cause increased bleeding during surgery and should not be taken for 2 weeks prior to and 1 week after surgery.  If you should need take something for relief of minor pain, you may take acetaminophen which is found in Tylenol,m Datril, Anacin-3 and Panadol. It is not blood thinner. The products listed below are.  Do not take any of the products listed below in addition to any listed on your instruction sheet.  A.P.C or A.P.C with Codeine Codeine Phosphate Capsules #3 Ibuprofen Ridaura  ABC compound Congesprin Imuran rimadil  Advil Cope Indocin Robaxisal  Alka-Seltzer Effervescent Pain Reliever and Antacid Coricidin or Coricidin-D  Indomethacin Rufen  Alka-Seltzer plus Cold Medicine Cosprin Ketoprofen S-A-C Tablets  Anacin Analgesic Tablets or Capsules Coumadin Korlgesic Salflex  Anacin Extra Strength Analgesic tablets or capsules CP-2 Tablets Lanoril Salicylate  Anaprox Cuprimine Capsules Levenox Salocol  Anexsia-D Dalteparin Magan Salsalate  Anodynos Darvon compound Magnesium Salicylate Sine-off  Ansaid Dasin Capsules Magsal Sodium Salicylate  Anturane Depen Capsules Marnal Soma  APF Arthritis pain formula Dewitt's Pills Measurin Stanback  Argesic Dia-Gesic Meclofenamic Sulfinpyrazone  Arthritis Bayer Timed Release Aspirin Diclofenac Meclomen Sulindac  Arthritis pain formula Anacin Dicumarol Medipren Supac  Analgesic (Safety coated) Arthralgen Diffunasal Mefanamic Suprofen  Arthritis Strength Bufferin Dihydrocodeine Mepro Compound Suprol  Arthropan liquid Dopirydamole Methcarbomol with Aspirin Synalgos  ASA tablets/Enseals Disalcid Micrainin Tagament  Ascriptin Doan's Midol Talwin  Ascriptin A/D Dolene  Mobidin Tanderil  Ascriptin Extra Strength Dolobid Moblgesic Ticlid  Ascriptin with Codeine Doloprin or Doloprin with Codeine Momentum Tolectin  Asperbuf Duoprin Mono-gesic Trendar  Aspergum Duradyne Motrin or Motrin IB Triminicin  Aspirin plain, buffered or enteric coated Durasal Myochrisine Trigesic  Aspirin Suppositories Easprin Nalfon Trillsate  Aspirin with Codeine Ecotrin Regular or Extra Strength Naprosyn Uracel  Atromid-S Efficin Naproxen Ursinus  Auranofin Capsules Elmiron Neocylate Vanquish  Axotal Emagrin Norgesic Verin  Azathioprine Empirin or Empirin with Codeine Normiflo Vitamin E  Azolid Emprazil Nuprin Voltaren  Bayer Aspirin plain,  buffered or children's or timed BC Tablets or powders Encaprin Orgaran Warfarin Sodium  Buff-a-Comp Enoxaparin Orudis Zorpin  Buff-a-Comp with Codeine Equegesic Os-Cal-Gesic   Buffaprin Excedrin plain, buffered or Extra Strength Oxalid   Bufferin Arthritis Strength Feldene Oxphenbutazone   Bufferin plain or Extra Strength Feldene Capsules Oxycodone with Aspirin   Bufferin with Codeine Fenoprofen Fenoprofen Pabalate or Pabalate-SF   Buffets II Flogesic Panagesic   Buffinol plain or Extra Strength Florinal or Florinal with Codeine Panwarfarin   Buf-Tabs Flurbiprofen Penicillamine   Butalbital Compound Four-way cold tablets Penicillin   Butazolidin Fragmin Pepto-Bismol   Carbenicillin Geminisyn Percodan   Carna Arthritis Reliever Geopen Persantine   Carprofen Gold's salt Persistin   Chloramphenicol Goody's Phenylbutazone   Chloromycetin Haltrain Piroxlcam   Clmetidine heparin Plaquenil   Cllnoril Hyco-pap Ponstel   Clofibrate Hydroxy chloroquine Propoxyphen         Before stopping any of these medications, be sure to consult the physician who ordered them.  Some, such as Coumadin (Warfarin) are ordered to prevent or treat serious conditions such as "deep thrombosis", "pumonary embolisms", and other heart problems.  The amount of time that  you may need off of the medication may also vary with the medication and the reason for which you were taking it.  If you are taking any of these medications, please make sure you notify your pain physician before you undergo any procedures.         Facet Joint Block The facet joints connect the bones of the spine (vertebrae). They make it possible for you to bend, twist, and make other movements with your spine. They also prevent you from overbending, overtwisting, and making other excessive movements.  A facet joint block is a procedure where a numbing medicine (anesthetic) is injected into a facet joint. Often, a type of anti-inflammatory medicine called a steroid is also injected. A facet joint block may be done for two reasons:  2. Diagnosis. A facet joint block may be done as a test to see whether neck or back pain is caused by a worn-down or infected facet joint. If the pain gets better after a facet joint block, it means the pain is probably coming from the facet joint. If the pain does not get better, it means the pain is probably not coming from the facet joint.  3. Therapy. A facet joint block may be done to relieve neck or back pain caused by a facet joint. A facet joint block is only done as a therapy if the pain does not improve with medicine, exercise programs, physical therapy, and other forms of pain management. LET [facility] CARE PROVIDER KNOW ABOUT:   Any allergies you have.   All medicines you are taking, including vitamins, herbs, eyedrops, and over-the-counter medicines and creams.   Previous problems you or members of your family have had with the use of anesthetics.   Any blood disorders you have had.   Other health problems you have. RISKS AND COMPLICATIONS Generally, having a facet joint block is safe. However, as with any procedure, complications can occur. Possible complications associated with having a facet joint block include:   Bleeding.   Injury  to a nerve near the injection site.   Pain at the injection site.   Weakness or numbness in areas controlled by nerves near the injection site.   Infection.   Temporary fluid retention.   Allergic reaction to anesthetics or medicines used during the procedure. BEFORE THE PROCEDURE  Follow your health care provider's instructions if you are taking dietary supplements or medicines. You may need to stop taking them or reduce your dosage.   Do not take any new dietary supplements or medicines without asking your health care provider first.   Follow your health care provider's instructions about eating and drinking before the procedure. You may need to stop eating and drinking several hours before the procedure.   Arrange to have an adult drive you home after the procedure. PROCEDURE 12. You may need to remove your clothing and dress in an open-back gown so that your health care provider can access your spine.  13. The procedure will be done while you are lying on an X-ray table. Most of the time you will be asked to lie on your stomach, but you may be asked to lie in a different position if an injection will be made in your neck.  14. Special machines will be used to monitor your oxygen levels, heart rate, and blood pressure.  15. If an injection will be made in your neck, an intravenous (IV) tube will be inserted into one of your veins. Fluids and medicine will flow directly into your body through the IV tube.  16. The area over the facet joint where the injection will be made will be cleaned with an antiseptic soap. The surrounding skin will be covered with sterile drapes.  17. An anesthetic will be applied to your skin to make the injection area numb. You may feel a temporary stinging or burning sensation.  18. A video X-ray machine will be used to locate the joint. A contrast dye may be injected into the facet joint area to help with locating the joint.  19. When the joint  is located, an anesthetic medicine will be injected into the joint through the needle.  54. Your health care provider will ask you whether you feel pain relief. If you do feel relief, a steroid may be injected to provide pain relief for a longer period of time. If you do not feel relief or feel only partial relief, additional injections of an anesthetic may be made in other facet joints.  21. The needle will be removed, the skin will be cleansed, and bandages will be applied.  AFTER THE PROCEDURE   You will be observed for 15-30 minutes before being allowed to go home. Do not drive. Have an adult drive you or take a taxi or public transportation instead.   If you feel pain relief, the pain will return in several hours or days when the anesthetic wears off.   You may feel pain relief 2-14 days after the procedure. The amount of time this relief lasts varies from person to person.   It is normal to feel some tenderness over the injected area(s) for 2 days following the procedure.   If you have diabetes, you may have a temporary increase in blood sugar.   This information is not intended to replace advice given to you by your health care provider. Make sure you discuss any questions you have with your health care provider.   Document Released: 06/26/2006 Document Revised: 02/25/2014 Document Reviewed: 11/25/2011 Elsevier Interactive Patient Education [employer].

## 2014-12-06 NOTE — Progress Notes (Signed)
   Subjective:    Patient ID: Amy Rocha, female    DOB: June 07, 1955, 59 y.o.   MRN: [id number]  HPI Patient is 59 year old female who returns to [facility] for further evaluation and treatment of pain involving the lower back and lower extremity region predominantly. On today's visit review patient's MRI and decision was made to proceed with lumbar facet, medial branch nerve, blocks at time return appointment in attempt to decrease severity of patient's symptoms, minimize progression of patient's symptoms, and avoid the need for more involved treatment. The patient was understanding and in agreement with suggested treatment plan. Patient's pain is aggravated by standing walking twisting turning maneuvers with pain becoming more intense as the day progresses. At the present time patient prefers to avoid surgical intervention and wished to proceed interventional treatment in attempt to avoid need for more involved treatment and to hopefully minimize progression of symptoms and allow patient to perform activities of daily living without experiencing severely disabling pain. The patient was understanding and in agreement status treatment plan       Review of Systems     Objective:   Physical Exam  There was mild tends to palpation paraspinal muscular region cervical region cervical facet region. There was mild tenderness of the splenius capitis and occipitalis musculature regions. Palpation of the acromioclavicular and glenohumeral joint region reproduced mild discomfort. Patient appeared to be with bilaterally equal grip strength. Tinel and Phalen's maneuver were without increased pain of significant degree. There was tends to palpation of the thoracic facet thoracic paraspinal musculature region with no crepitus of the thoracic region noted. Palpation over the region of the lumbar paraspinal muscles region lumbar facet region associated with moderate severe discomfort. Lateral bending  and rotation and extension and palpation of the lumbar facets reproduce moderately severe discomfort. This tends to palpation of the PSIS and PII S regions a mild to moderate degree. Mild to moderate tends to palpation greater trochanteric region iliotibial band region. Straight leg raising tolerates approximately 20 without increased pain with dorsiflexion noted. There was negative clonus negative Homans. Abdomen was nontender with no costovertebral angle tenderness noted. The predominant portion patient's pain was reproducible palpation of the lumbar facet lumbar paraspinal musculature region gluteal and piriformis musculature region.      Assessment & Plan:   Degenerative disc disease lumbar spine Convex left scoliosis. Degenerative disc disease with facet hypertrophy at multiple levels involving L4-5 and L5-S1 predominantly. Mild disc bulging at L1-2 L4-5.  Lumbar facet syndrome  Sacroiliac joint dysfunction  Status post bilateral total hip replacement  Avascular necrosis of hips  Status post spinal cord stimulator placement complicated by infection requiring explantation of spinal cord stimulator and healing by secondary intention  Nephrolithiasis    PLAN   Continue present medication Opana ER and oxycodone acetaminophen  Lumbar facet, medial branch nerve, blocks to be performed at time return appointment  F/U PCP C Wicker for evaliation of  BP and general medical  condition  F/U surgical evaluation. May consider pending follow-up evaluations  F/U neurological evaluation. May consider pending follow-up evaluations  May consider radiofrequency rhizolysis or intraspinal procedures pending response to present treatment and F/U evaluation   Patient to call [facility] Center should patient have concerns prior to scheduled return appointment.

## 2014-12-06 NOTE — Progress Notes (Signed)
Safety precautions to be maintained throughout the outpatient stay will include: orient to surroundings, keep bed in low position, maintain call bell within reach at all times, provide assistance with transfer out of bed and ambulation.  

## 2014-12-12 ENCOUNTER — Ambulatory Visit: Payer: Medicare Other | Attending: Pain Medicine | Admitting: Pain Medicine

## 2014-12-12 ENCOUNTER — Encounter: Payer: Self-pay | Admitting: Pain Medicine

## 2014-12-12 VITALS — BP 108/63 | HR 61 | Temp 98.2°F | Resp 18 | Wt 155.0 lb

## 2014-12-12 DIAGNOSIS — Z96643 Presence of artificial hip joint, bilateral: Secondary | ICD-10-CM

## 2014-12-12 DIAGNOSIS — M5136 Other intervertebral disc degeneration, lumbar region: Secondary | ICD-10-CM | POA: Insufficient documentation

## 2014-12-12 DIAGNOSIS — M419 Scoliosis, unspecified: Secondary | ICD-10-CM | POA: Insufficient documentation

## 2014-12-12 DIAGNOSIS — M47816 Spondylosis without myelopathy or radiculopathy, lumbar region: Secondary | ICD-10-CM

## 2014-12-12 DIAGNOSIS — M545 Low back pain: Secondary | ICD-10-CM | POA: Insufficient documentation

## 2014-12-12 DIAGNOSIS — M533 Sacrococcygeal disorders, not elsewhere classified: Secondary | ICD-10-CM

## 2014-12-12 DIAGNOSIS — M79606 Pain in leg, unspecified: Secondary | ICD-10-CM | POA: Insufficient documentation

## 2014-12-12 DIAGNOSIS — Z79899 Other long term (current) drug therapy: Secondary | ICD-10-CM | POA: Insufficient documentation

## 2014-12-12 DIAGNOSIS — M87051 Idiopathic aseptic necrosis of right femur: Secondary | ICD-10-CM

## 2014-12-12 DIAGNOSIS — M87052 Idiopathic aseptic necrosis of left femur: Secondary | ICD-10-CM

## 2014-12-12 DIAGNOSIS — M47817 Spondylosis without myelopathy or radiculopathy, lumbosacral region: Secondary | ICD-10-CM | POA: Diagnosis not present

## 2014-12-12 MED ORDER — CIPROFLOXACIN HCL 250 MG PO TABS
250.0000 mg | ORAL_TABLET | Freq: Two times a day (BID) | ORAL | Status: DC
Start: 2014-12-12 — End: 2015-03-29

## 2014-12-12 MED ORDER — CEFAZOLIN SODIUM 1-5 GM-% IV SOLN: 1.0000 g | Freq: Once | INTRAVENOUS | Status: DC

## 2014-12-12 MED ORDER — TRIAMCINOLONE ACETONIDE 40 MG/ML IJ SUSP
40.0000 mg | Freq: Once | INTRAMUSCULAR | Status: AC
  Administered 2014-12-12: 13:00:00

## 2014-12-12 MED ORDER — FENTANYL CITRATE (PF) 100 MCG/2ML IJ SOLN
100.0000 ug | Freq: Once | INTRAMUSCULAR | Status: AC
  Administered 2014-12-12: 100 ug via INTRAVENOUS

## 2014-12-12 MED ORDER — FENTANYL CITRATE (PF) 100 MCG/2ML IJ SOLN
INTRAMUSCULAR | Status: AC
  Administered 2014-12-12: 100 ug via INTRAVENOUS
  Filled 2014-12-12: qty 2

## 2014-12-12 MED ORDER — MIDAZOLAM HCL 5 MG/5ML IJ SOLN
5.0000 mg | Freq: Once | INTRAMUSCULAR | Status: AC
  Administered 2014-12-12: 5 mg via INTRAVENOUS

## 2014-12-12 MED ORDER — BUPIVACAINE HCL (PF) 0.25 % IJ SOLN
30.0000 mL | Freq: Once | INTRAMUSCULAR | Status: AC
  Administered 2014-12-12: 13:00:00

## 2014-12-12 MED ORDER — LIDOCAINE HCL (PF) 1 % IJ SOLN: 10.0000 mL | Freq: Once | INTRAMUSCULAR | Status: DC

## 2014-12-12 MED ORDER — ORPHENADRINE CITRATE 30 MG/ML IJ SOLN
60.0000 mg | Freq: Once | INTRAMUSCULAR | Status: AC
  Administered 2014-12-12: 13:00:00 via INTRAMUSCULAR

## 2014-12-12 MED ORDER — MIDAZOLAM HCL 5 MG/5ML IJ SOLN
INTRAMUSCULAR | Status: AC
  Administered 2014-12-12: 5 mg via INTRAVENOUS
  Filled 2014-12-12: qty 5

## 2014-12-12 MED ORDER — CIPROFLOXACIN HCL 250 MG PO TABS
250.0000 mg | ORAL_TABLET | Freq: Two times a day (BID) | ORAL | Status: DC
  Administered 2015-07-31: 250 mg via ORAL
  Filled 2014-12-12 (×3): qty 1

## 2014-12-12 MED ORDER — LACTATED RINGERS IV SOLN: 1000.0000 mL | INTRAVENOUS | Status: DC

## 2014-12-12 MED ORDER — ORPHENADRINE CITRATE 30 MG/ML IJ SOLN
INTRAMUSCULAR | Status: AC
  Filled 2014-12-12: qty 2

## 2014-12-12 MED ORDER — BUPIVACAINE HCL (PF) 0.25 % IJ SOLN
INTRAMUSCULAR | Status: AC
  Filled 2014-12-12: qty 30

## 2014-12-12 MED ORDER — CIPROFLOXACIN HCL 250 MG PO TABS: 250.0000 mg | ORAL_TABLET | Freq: Two times a day (BID) | ORAL | Status: DC

## 2014-12-12 MED ORDER — TRIAMCINOLONE ACETONIDE 40 MG/ML IJ SUSP
INTRAMUSCULAR | Status: AC
  Filled 2014-12-12: qty 1

## 2014-12-12 NOTE — Patient Instructions (Addendum)
Continue present medication oxycodone acetaminophen and Opana extended release please obtain antibiotic Cipro and begin taking Cipro today as prescribed  F/U PCP Dr. Jeananne Rama and Julian Hy for evaliation of  BP and general medical  conditio  F/U surgical evaluation. May consider pending follow-up evaluations  F/U neurological evaluation. May consider pending follow-up evaluations  May consider radiofrequency rhizolysis or intraspinal procedures pending response to present treatment and F/U evaluation GENERAL RISKS AND COMPLICATIONS  What are the risk, side effects and possible complications? Generally speaking, most procedures are safe.  However, with any procedure there are risks, side effects, and the possibility of complications.  The risks and complications are dependent upon the sites that are lesioned, or the type of nerve block to be performed.  The closer the procedure is to the spine, the more serious the risks are.  Great care is taken when placing the radio frequency needles, block needles or lesioning probes, but sometimes complications can occur. 1. Infection: Any time there is an injection through the skin, there is a risk of infection.  This is why sterile conditions are used for these blocks.  There are four possible types of infection. 1. Localized skin infection. 2. Central Nervous System Infection-This can be in the form of Meningitis, which can be deadly. 3. Epidural Infections-This can be in the form of an epidural abscess, which can cause pressure inside of the spine, causing compression of the spinal cord with subsequent paralysis. This would require an emergency surgery to decompress, and there are no guarantees that the patient would recover from the paralysis. 4. Discitis-This is an infection of the intervertebral discs.  It occurs in about 1% of discography procedures.  It is difficult to treat and it may lead to surgery.        2. Pain: the needles have to go through skin  and soft tissues, will cause soreness.       3. Damage to internal structures:  The nerves to be lesioned may be near blood vessels or    other nerves which can be potentially damaged.       4. Bleeding: Bleeding is more common if the patient is taking blood thinners such as  aspirin, Coumadin, Ticiid, Plavix, etc., or if he/she have some genetic predisposition  such as hemophilia. Bleeding into the spinal canal can cause compression of the spinal  cord with subsequent paralysis.  This would require an emergency surgery to  decompress and there are no guarantees that the patient would recover from the  paralysis.       5. Pneumothorax:  Puncturing of a lung is a possibility, every time a needle is introduced in  the area of the chest or upper back.  Pneumothorax refers to free air around the  collapsed lung(s), inside of the thoracic cavity (chest cavity).  Another two possible  complications related to a similar event would include: Hemothorax and Chylothorax.   These are variations of the Pneumothorax, where instead of air around the collapsed  lung(s), you may have blood or chyle, respectively.       6. Spinal headaches: They may occur with any procedures in the area of the spine.       7. Persistent CSF (Cerebro-Spinal Fluid) leakage: This is a rare problem, but may occur  with prolonged intrathecal or epidural catheters either due to the formation of a fistulous  track or a dural tear.       8. Nerve damage: By working so close to  the spinal cord, there is always a possibility of  nerve damage, which could be as serious as a permanent spinal cord injury with  paralysis.       9. Death:  Although rare, severe deadly allergic reactions known as "Anaphylactic  reaction" can occur to any of the medications used.      10. Worsening of the symptoms:  We can always make thing worse.  What are the chances of something like this happening? Chances of any of this occuring are extremely low.  By statistics,  you have more of a chance of getting killed in a motor vehicle accident: while driving to the hospital than any of the above occurring .  Nevertheless, you should be aware that they are possibilities.  In general, it is similar to taking a shower.  Everybody knows that you can slip, hit your head and get killed.  Does that mean that you should not shower again?  Nevertheless always keep in mind that statistics do not mean anything if you happen to be on the wrong side of them.  Even if a procedure has a 1 (one) in a 1,000,000 (million) chance of going wrong, it you happen to be that one..Also, keep in mind that by statistics, you have more of a chance of having something go wrong when taking medications.  Who should not have this procedure? If you are on a blood thinning medication (e.g. Coumadin, Plavix, see list of "Blood Thinners"), or if you have an active infection going on, you should not have the procedure.  If you are taking any blood thinners, please inform your physician.  How should I prepare for this procedure?  Do not eat or drink anything at least six hours prior to the procedure.  Bring a driver with you .  It cannot be a taxi.  Come accompanied by an adult that can drive you back, and that is strong enough to help you if your legs get weak or numb from the local anesthetic.  Take all of your medicines the morning of the procedure with just enough water to swallow them.  If you have diabetes, make sure that you are scheduled to have your procedure done first thing in the morning, whenever possible.  If you have diabetes, take only half of your insulin dose and notify our nurse that you have done so as soon as you arrive at the clinic.  If you are diabetic, but only take blood sugar pills (oral hypoglycemic), then do not take them on the morning of your procedure.  You may take them after you have had the procedure.  Do not take aspirin or any aspirin-containing medications, at  least eleven (11) days prior to the procedure.  They may prolong bleeding.  Wear loose fitting clothing that may be easy to take off and that you would not mind if it got stained with Betadine or blood.  Do not wear any jewelry or perfume  Remove any nail coloring.  It will interfere with some of our monitoring equipment.  NOTE: Remember that this is not meant to be interpreted as a complete list of all possible complications.  Unforeseen problems may occur.  BLOOD THINNERS The following drugs contain aspirin or other products, which can cause increased bleeding during surgery and should not be taken for 2 weeks prior to and 1 week after surgery.  If you should need take something for relief of minor pain, you may take acetaminophen which is found  in Tylenol,m Datril, Anacin-3 and Panadol. It is not blood thinner. The products listed below are.  Do not take any of the products listed below in addition to any listed on your instruction sheet.  A.P.C or A.P.C with Codeine Codeine Phosphate Capsules #3 Ibuprofen Ridaura  ABC compound Congesprin Imuran rimadil  Advil Cope Indocin Robaxisal  Alka-Seltzer Effervescent Pain Reliever and Antacid Coricidin or Coricidin-D  Indomethacin Rufen  Alka-Seltzer plus Cold Medicine Cosprin Ketoprofen S-A-C Tablets  Anacin Analgesic Tablets or Capsules Coumadin Korlgesic Salflex  Anacin Extra Strength Analgesic tablets or capsules CP-2 Tablets Lanoril Salicylate  Anaprox Cuprimine Capsules Levenox Salocol  Anexsia-D Dalteparin Magan Salsalate  Anodynos Darvon compound Magnesium Salicylate Sine-off  Ansaid Dasin Capsules Magsal Sodium Salicylate  Anturane Depen Capsules Marnal Soma  APF Arthritis pain formula Dewitt's Pills Measurin Stanback  Argesic Dia-Gesic Meclofenamic Sulfinpyrazone  Arthritis Bayer Timed Release Aspirin Diclofenac Meclomen Sulindac  Arthritis pain formula Anacin Dicumarol Medipren Supac  Analgesic (Safety coated) Arthralgen  Diffunasal Mefanamic Suprofen  Arthritis Strength Bufferin Dihydrocodeine Mepro Compound Suprol  Arthropan liquid Dopirydamole Methcarbomol with Aspirin Synalgos  ASA tablets/Enseals Disalcid Micrainin Tagament  Ascriptin Doan's Midol Talwin  Ascriptin A/D Dolene Mobidin Tanderil  Ascriptin Extra Strength Dolobid Moblgesic Ticlid  Ascriptin with Codeine Doloprin or Doloprin with Codeine Momentum Tolectin  Asperbuf Duoprin Mono-gesic Trendar  Aspergum Duradyne Motrin or Motrin IB Triminicin  Aspirin plain, buffered or enteric coated Durasal Myochrisine Trigesic  Aspirin Suppositories Easprin Nalfon Trillsate  Aspirin with Codeine Ecotrin Regular or Extra Strength Naprosyn Uracel  Atromid-S Efficin Naproxen Ursinus  Auranofin Capsules Elmiron Neocylate Vanquish  Axotal Emagrin Norgesic Verin  Azathioprine Empirin or Empirin with Codeine Normiflo Vitamin E  Azolid Emprazil Nuprin Voltaren  Bayer Aspirin plain, buffered or children's or timed BC Tablets or powders Encaprin Orgaran Warfarin Sodium  Buff-a-Comp Enoxaparin Orudis Zorpin  Buff-a-Comp with Codeine Equegesic Os-Cal-Gesic   Buffaprin Excedrin plain, buffered or Extra Strength Oxalid   Bufferin Arthritis Strength Feldene Oxphenbutazone   Bufferin plain or Extra Strength Feldene Capsules Oxycodone with Aspirin   Bufferin with Codeine Fenoprofen Fenoprofen Pabalate or Pabalate-SF   Buffets II Flogesic Panagesic   Buffinol plain or Extra Strength Florinal or Florinal with Codeine Panwarfarin   Buf-Tabs Flurbiprofen Penicillamine   Butalbital Compound Four-way cold tablets Penicillin   Butazolidin Fragmin Pepto-Bismol   Carbenicillin Geminisyn Percodan   Carna Arthritis Reliever Geopen Persantine   Carprofen Gold's salt Persistin   Chloramphenicol Goody's Phenylbutazone   Chloromycetin Haltrain Piroxlcam   Clmetidine heparin Plaquenil   Cllnoril Hyco-pap Ponstel   Clofibrate Hydroxy chloroquine Propoxyphen         Before  stopping any of these medications, be sure to consult the physician who ordered them.  Some, such as Coumadin (Warfarin) are ordered to prevent or treat serious conditions such as "deep thrombosis", "pumonary embolisms", and other heart problems.  The amount of time that you may need off of the medication may also vary with the medication and the reason for which you were taking it.  If you are taking any of these medications, please make sure you notify your pain physician before you undergo any procedures.         Pain Management Discharge Instructions  General Discharge Instructions :  If you need to reach your doctor call: Monday-Friday 8:00 am - 4:00 pm at [phone] or toll free [phone].  After clinic hours [phone] to have operator reach doctor.  Bring all of your medication bottles to all your appointments in  the pain clinic.  To cancel or reschedule your appointment with Pain Management please remember to call 24 hours in advance to avoid a fee.  Refer to the educational materials which you have been given on: General Risks, I had my Procedure. Discharge Instructions, Post Sedation.  Post Procedure Instructions:  The drugs you were given will stay in your system until tomorrow, so for the next 24 hours you should not drive, make any legal decisions or drink any alcoholic beverages.  You may eat anything you prefer, but it is better to start with liquids then soups and crackers, and gradually work up to solid foods.  Please notify your doctor immediately if you have any unusual bleeding, trouble breathing or pain that is not related to your normal pain.  Depending on the type of procedure that was done, some parts of your body may feel week and/or numb.  This usually clears up by tonight or the next day.  Walk with the use of an assistive device or accompanied by an adult for the 24 hours.  You may use ice on the affected area for the first 24 hours.  Put ice in  a Ziploc bag and cover with a towel and place against area 15 minutes on 15 minutes off.  You may switch to heat after 24 hours.

## 2014-12-12 NOTE — Progress Notes (Signed)
Safety precautions to be maintained throughout the outpatient stay will include: orient to surroundings, keep bed in low position, maintain call bell within reach at all times, provide assistance with transfer out of bed and ambulation.  

## 2014-12-12 NOTE — Progress Notes (Signed)
Subjective:    Patient ID: Amy Rocha, female    DOB: [birth year], 59 y.o.   MRN: [id number]  HPI  PROCEDURE PERFORMED: Lumbar facet (medial branch block)   NOTE: The patient is a 59 y.o. female who returns to [place] for further evaluation and treatment of pain involving the lumbar and lower extremity region.  MRI  revealed the patient to be with evidence of degenerative disc disease lumbar spine Convex left scoliosis. Degenerative disc disease with facet hypertrophy at multiple levels involving L4-5 and L5-S1 predominantly. Mild disc bulging at L1-2 L4-5.Marland Kitchen There is concern regarding component of patient's pain being due to facet arthropathy with facet syndrome The risks, benefits, and expectations of the procedure have been discussed and explained to the patient who was understanding and in agreement with suggested treatment plan. We will proceed with interventional treatment as discussed and as explained to the patient who was understanding and wished to proceed with procedure as planned.   DESCRIPTION OF PROCEDURE: Lumbar facet (medial branch block) with IV Versed, IV fentanyl conscious sedation, EKG, blood pressure, pulse, and pulse oximetry monitoring. The procedure was performed with the patient in the prone position. Betadine prep of proposed entry site performed.   NEEDLE PLACEMENT AT:  left L  3 lumbar facet (medial branch block). Under fluoroscopic guidance with oblique orientation of 15 degrees, a 22-gauge needle was inserted at the L  3 vertebral body level with needle placed at the targeted area of Burton's Eye or Eye of the Scotty Dog with documentation of needle placement in the superior and lateral border of targeted area of Burton's Eye or Eye of the Scotty Dog with oblique orientation of 15 degrees. Following documentation of needle placement at the L  3 vertebral body level, needle placement was then accomplished at the L  4 vertebral body level.   NEEDLE  PLACEMENT AT  L4 and L5 VERTEBRAL BODY LEVELS ON THE LEFT SIDE The procedure was performed at the  L4 and L5 vertebral body levels exactly as was performed at the L  3 vertebral body level utilizing the same technique and under fluoroscopic guidance.  NEEDLE PLACEMENT AT THE SACRAL ALA with AP view of the lumbosacral spine. With the patient in the prone position, Betadine prep of proposed entry site accomplished, a 22 gauge needle was inserted in the region of the sacral ala (groove formed by the superior articulating process of S1 and the sacral wing). Following documentation of needle placement at the sacral ala,  needle placement was then accomplished at the S1 foramen level.   NEEDLE PLACEMENT AT THE S1 FORAMEN LEVEL under fluoroscopic guidance with AP view of the lumbosacral spine and cephalad orientation of the fluoroscope, a 22-gauge needle was placed at the superior and lateral border of the S1 foramen under fluoroscopic guidance. Following documentation of needle placement at the S1 foramen.   Needle placement was then verified at all levels on lateral view. Following documentation of needle placement at all levels on lateral view and following negative aspiration for heme and CSF, each level was injected with 1 mL of 0.25% bupivacaine with Kenalog.     LUMBAR FACET, MEDIAL BRANCH NERVE, BLOCKS PERFORMED ON THE RIGHT SIDE   The procedure was performed on the right side exactly as was performed on the left side at the same levels and utilizing the same technique under fluoroscopic guidance.     The patient tolerated the procedure well. A total of 40 mg of  Kenalog was utilized for the procedure.   PLAN:  1. Medications: The patient will continue presently prescribed medications Opana ER and oxycodone acetaminophen. 2. May consider modification of treatment regimen at time of return appointment pending response to treatment rendered on today's visit. 3. The patient is to follow-up with  primary care physician  C Wicker for further evaluation of blood pressure and general medical condition status post steroid injection performed on today's visit. 4. Surgical follow-up evaluation. May consider further surgical evaluation as discussed 5. Neurological follow-up evaluation. May consider [facility] EMG studies as discussed 6. The patient may be candidate for radiofrequency procedures, implantation type procedures, and other treatment pending response to treatment and follow-up evaluation. 7. The patient has been advised to call the Pain Management Center prior to scheduled return appointment should there be significant change in condition or should patient have other concerns regarding condition prior to scheduled return appointment.  The patient is understanding and in agreement with suggested treatment plan.   Review of Systems     Objective:   Physical Exam        Assessment & Plan:

## 2014-12-13 ENCOUNTER — Telehealth: Payer: Self-pay | Admitting: *Deleted

## 2014-12-13 NOTE — Telephone Encounter (Signed)
Patient verbalizes soreness, encouraged to use ice or heat as needed. No other c/o.

## 2014-12-14 ENCOUNTER — Ambulatory Visit: Payer: Medicare Other | Admitting: Unknown Physician Specialty

## 2014-12-19 ENCOUNTER — Other Ambulatory Visit: Payer: Self-pay | Admitting: Unknown Physician Specialty

## 2014-12-23 ENCOUNTER — Ambulatory Visit: Payer: Medicare Other | Admitting: Unknown Physician Specialty

## 2015-01-05 ENCOUNTER — Encounter: Payer: Self-pay | Admitting: Pain Medicine

## 2015-01-05 ENCOUNTER — Ambulatory Visit: Payer: Medicare Other | Attending: Pain Medicine | Admitting: Pain Medicine

## 2015-01-05 VITALS — BP 111/65 | HR 104 | Temp 98.0°F | Resp 18 | Ht 65.0 in | Wt 155.0 lb

## 2015-01-05 DIAGNOSIS — M791 Myalgia: Secondary | ICD-10-CM | POA: Diagnosis not present

## 2015-01-05 DIAGNOSIS — M47816 Spondylosis without myelopathy or radiculopathy, lumbar region: Secondary | ICD-10-CM

## 2015-01-05 DIAGNOSIS — Z9689 Presence of other specified functional implants: Secondary | ICD-10-CM | POA: Diagnosis not present

## 2015-01-05 DIAGNOSIS — M5136 Other intervertebral disc degeneration, lumbar region: Secondary | ICD-10-CM | POA: Diagnosis not present

## 2015-01-05 DIAGNOSIS — M5126 Other intervertebral disc displacement, lumbar region: Secondary | ICD-10-CM | POA: Diagnosis not present

## 2015-01-05 DIAGNOSIS — Z96643 Presence of artificial hip joint, bilateral: Secondary | ICD-10-CM

## 2015-01-05 DIAGNOSIS — M4186 Other forms of scoliosis, lumbar region: Secondary | ICD-10-CM | POA: Diagnosis not present

## 2015-01-05 DIAGNOSIS — M8788 Other osteonecrosis, other site: Secondary | ICD-10-CM | POA: Diagnosis not present

## 2015-01-05 DIAGNOSIS — M79605 Pain in left leg: Secondary | ICD-10-CM | POA: Diagnosis present

## 2015-01-05 DIAGNOSIS — M5416 Radiculopathy, lumbar region: Secondary | ICD-10-CM | POA: Diagnosis not present

## 2015-01-05 DIAGNOSIS — M533 Sacrococcygeal disorders, not elsewhere classified: Secondary | ICD-10-CM | POA: Diagnosis not present

## 2015-01-05 DIAGNOSIS — M545 Low back pain: Secondary | ICD-10-CM | POA: Diagnosis present

## 2015-01-05 DIAGNOSIS — M87051 Idiopathic aseptic necrosis of right femur: Secondary | ICD-10-CM

## 2015-01-05 DIAGNOSIS — M47817 Spondylosis without myelopathy or radiculopathy, lumbosacral region: Secondary | ICD-10-CM | POA: Diagnosis not present

## 2015-01-05 DIAGNOSIS — M79604 Pain in right leg: Secondary | ICD-10-CM | POA: Diagnosis present

## 2015-01-05 DIAGNOSIS — M87052 Idiopathic aseptic necrosis of left femur: Secondary | ICD-10-CM

## 2015-01-05 MED ORDER — OXYMORPHONE HCL ER 20 MG PO T12A: EXTENDED_RELEASE_TABLET | ORAL | Status: DC

## 2015-01-05 MED ORDER — OXYCODONE-ACETAMINOPHEN 10-325 MG PO TABS
ORAL_TABLET | ORAL | Status: DC
Start: 2015-01-05 — End: 2015-01-26

## 2015-01-05 MED ORDER — OXYCODONE-ACETAMINOPHEN 10-325 MG PO TABS: ORAL_TABLET | ORAL | Status: DC

## 2015-01-05 MED ORDER — OXYMORPHONE HCL ER 10 MG PO T12A: EXTENDED_RELEASE_TABLET | ORAL | Status: DC

## 2015-01-05 NOTE — Progress Notes (Signed)
Safety precautions to be maintained throughout the outpatient stay will include: orient to surroundings, keep bed in low position, maintain call bell within reach at all times, provide assistance with transfer out of bed and ambulation.  

## 2015-01-05 NOTE — Patient Instructions (Addendum)
PLAN   cc

## 2015-01-05 NOTE — Progress Notes (Signed)
   Subjective:    Patient ID: Amy Rocha, female    DOB: 1955-11-26, 59 y.o.   MRN: [id number]  HPI The patient is a 59 year old female who returns to pain management for further evaluation and treatment of pain involving the lower back and lower extremity region predominantly. Patient had improvement with previous lumbar facet, medial branch nerve, blocks. At the present time patient states that she is notice some mild return of pain. We will proceed with lumbar facet, medial branch nerve, blocks at time return appointment and will continue medications consisting as discussed. The patient denies any trauma change in events sedated him to cause change in symptomatology. We will discuss surgical it evaluation and we'll avoid surgical evaluation at this time. We will proceed with block of nerves to the sacroiliac joint at time of return appointment in attempt to decrease severity of symptoms, minimize progression of symptoms, and avoid need for more involved treatment.         Review of Systems     Objective:   Physical Exam There was tenderness to palpation of the splenius capitis and occipitalis musculature regions of mild degree with mild tenderness of the cervical facet cervical paraspinal muscles. There was mild tenderness over the thoracic facet thoracic paraspinal muscles. Palpation of the acromioclavicular and glenohumeral joint regions reproduces I'll discomfort. Patient appeared to be unremarkable Spurling's maneuver. Tinel and Phalen's maneuver were without increase of pain of significant degree. Palpation over the region of the thoracic facet thoracic paraspinal musculature region reproduced pain of mild degree with no crepitus of the thoracic region noted. Palpation over the lumbar paraspinal musculature region above said region was with tenderness to palpation of moderate degree with moderate tenderness to palpation over the PSIS and PII S regions. There was moderate tenderness to  palpation of the greater trochanteric region and iliotibial band region. Straight leg raise was tolerates approximately 20 without a definite increased pain with dorsiflexion noted. No definite sensory deficit or dermatomal distribution was detected. There was negative clonus negative Homans. Marland Kitchen DTRs were difficult to elicit. Abdomen was nontender with no costovertebral length and is noted.       Assessment & Plan:  Degenerative disc disease lumbar spine Convex left scoliosis. Degenerative disc disease with facet hypertrophy at multiple levels involving L4-5 and L5-S1 predominantly. Mild disc bulging at L1-2 L4-5.  Lumbar facet syndrome  Sacroiliac joint dysfunction  Status post bilateral total hip replacement  Avascular necrosis of hips  Status post spinal cord stimulator placement complicated by infection requiring explantation of spinal cord stimulator and healing by secondary intention    PLAN  Continue present medication Cymbalta oxycodone and Opana ER. Patient was caution regarding Opana ER being 20 mg which is stronger. Patient was caution regarding respiratory depression excessive sedation confusion and other side effects patient admitted to provide use of Opana ER and denied side effects from Opana ER 20 mg previously  Block of nerves to the sacroiliac joint to be performed at time return appointment  F/U PCP C Wicker for evaliation of  BP and general medical  condition  F/U surgical evaluation. May consider pending follow-up evaluations  F/U neurological evaluation. May consider pending follow-up evaluations  May consider radiofrequency rhizolysis or intraspinal procedures pending response to present treatment and F/U evaluation   Patient to call Pain Management Center should patient have concerns prior to scheduled return appointment.

## 2015-01-06 ENCOUNTER — Other Ambulatory Visit: Payer: Self-pay | Admitting: Unknown Physician Specialty

## 2015-01-26 ENCOUNTER — Ambulatory Visit: Payer: Medicare Other | Attending: Pain Medicine | Admitting: Pain Medicine

## 2015-01-26 ENCOUNTER — Encounter: Payer: Self-pay | Admitting: Pain Medicine

## 2015-01-26 VITALS — BP 123/93 | HR 94 | Temp 98.1°F | Resp 14 | Ht 65.0 in | Wt 150.0 lb

## 2015-01-26 DIAGNOSIS — M879 Osteonecrosis, unspecified: Secondary | ICD-10-CM | POA: Insufficient documentation

## 2015-01-26 DIAGNOSIS — M5126 Other intervertebral disc displacement, lumbar region: Secondary | ICD-10-CM | POA: Diagnosis not present

## 2015-01-26 DIAGNOSIS — M47816 Spondylosis without myelopathy or radiculopathy, lumbar region: Secondary | ICD-10-CM

## 2015-01-26 DIAGNOSIS — Z9689 Presence of other specified functional implants: Secondary | ICD-10-CM | POA: Insufficient documentation

## 2015-01-26 DIAGNOSIS — M47817 Spondylosis without myelopathy or radiculopathy, lumbosacral region: Secondary | ICD-10-CM | POA: Diagnosis not present

## 2015-01-26 DIAGNOSIS — M87051 Idiopathic aseptic necrosis of right femur: Secondary | ICD-10-CM

## 2015-01-26 DIAGNOSIS — M533 Sacrococcygeal disorders, not elsewhere classified: Secondary | ICD-10-CM | POA: Diagnosis not present

## 2015-01-26 DIAGNOSIS — M546 Pain in thoracic spine: Secondary | ICD-10-CM | POA: Diagnosis present

## 2015-01-26 DIAGNOSIS — M542 Cervicalgia: Secondary | ICD-10-CM | POA: Diagnosis present

## 2015-01-26 DIAGNOSIS — M545 Low back pain: Secondary | ICD-10-CM | POA: Diagnosis present

## 2015-01-26 DIAGNOSIS — M5136 Other intervertebral disc degeneration, lumbar region: Secondary | ICD-10-CM | POA: Diagnosis not present

## 2015-01-26 DIAGNOSIS — M5416 Radiculopathy, lumbar region: Secondary | ICD-10-CM | POA: Diagnosis not present

## 2015-01-26 DIAGNOSIS — Z96643 Presence of artificial hip joint, bilateral: Secondary | ICD-10-CM | POA: Insufficient documentation

## 2015-01-26 DIAGNOSIS — M87052 Idiopathic aseptic necrosis of left femur: Secondary | ICD-10-CM

## 2015-01-26 DIAGNOSIS — M791 Myalgia: Secondary | ICD-10-CM | POA: Diagnosis not present

## 2015-01-26 MED ORDER — OXYCODONE-ACETAMINOPHEN 10-325 MG PO TABS: ORAL_TABLET | ORAL | Status: DC

## 2015-01-26 MED ORDER — OXYMORPHONE HCL ER 20 MG PO T12A: EXTENDED_RELEASE_TABLET | ORAL | Status: DC

## 2015-01-26 NOTE — Progress Notes (Signed)
   Subjective:    Patient ID: Amy Rocha, female    DOB: [name] 26, 1956, 59 y.o.   MRN: [id number]  HPI  Patient is a 59 year old female who returns to pain management for further evaluation and treatment of pain involving the region of the mid lower back or extremity region predominantly with pain of the neck of lesser degree. The patient states she has had improvement of her pain with prior treatment performed in pain medicine Center. We discussed patient's medications including Opana extended release 20 mg as well as the use of oxycodone acetaminophen. At the present time we will continue patient's medications and will proceed with interventional treatment at time return appointment consisting of block of nerves to the sacroiliac joint. The patient denied any trauma change in activities of daily living the cost change in his patient's symptomatology. We will consider additional modifications treatment regimen pending response to present treatment The patient was in agreement with suggested treatment plan     Review of Systems     Objective:   Physical Exam   There was tenderness of the splenius capitis and occipitalis region palpation which reproduces minimal discomfort. There appeared to be unremarkable Spurling's maneuver and patient appeared to be with bilaterally equal grip strength with Tinel and Phalen's maneuver reproducing minimal discomfort. There appeared to be tinged palpation of the cervical facet cervical paraspinal must reason a minimal degree. Palpation of the thoracic facet thoracic paraspinal muscles reproduces minimal discomfort as well no crepitus of the thoracic region noted. There was tennis of the region of the lumbar paraspinal must reason lumbar facet region palpation which reproduces pain of moderate degree with palpation over the PSIS and PII S region reproducing pain of moderate to moderately severe degree. Lateral bending rotation extension and palpation of the lumbar  facets reproduce moderate discomfort. Straight leg raise was tolerates approximately 30 without increased pain with dorsiflexion noted. There was tends to palpation over the PSIS and PII S region a moderate to moderately severe degree. There was tends to palpation greater trochanteric region iliotibial band region of minimal degree. No definite sensory deficit or dermatomal distribution of the lower extremities noted. There was negative clonus negative Homans. Abdomen nontender with no costovertebral tenderness noted.          Assessment & Plan:     Degenerative disc disease lumbar spine Convex left scoliosis. Degenerative disc disease with facet hypertrophy at multiple levels involving L4-5 and L5-S1 predominantly. Mild disc bulging at L1-2 L4-5.  Lumbar facet syndrome  Sacroiliac joint dysfunction  Status post bilateral total hip replacement  Avascular necrosis of hips  Status post spinal cord stimulator placement complicated by infection requiring explantation of spinal cord stimulator and healing by secondary intention    PLAN    Continue present medications Cymbalta oxycodone acetaminophen and oxymorphone extended release   Block of nerves to the sacroiliac joint  to be performed at time return appointment  F/U PCP C Wicker for evaliation of  BP and general medical  condition  F/U surgical evaluation. May consider pending follow-up evaluations  F/U neurological evaluation. May consider pending follow-up evaluations  May consider radiofrequency rhizolysis or intraspinal procedures pending response to present treatment and F/U evaluation   Patient to call Pain Management Center should patient have concerns prior to scheduled return appointment

## 2015-01-26 NOTE — Progress Notes (Signed)
Safety precautions to be maintained throughout the outpatient stay will include: orient to surroundings, keep bed in low position, maintain call bell within reach at all times, provide assistance with transfer out of bed and ambulation.  

## 2015-01-26 NOTE — Patient Instructions (Addendum)
PLAN   Continue present medications Cymbalta oxycodone acetaminophen and oxymorphone extended release   Block of nerves to the sacroiliac joint  to be performed at time return appointment  F/U PCP C Wicker for evaliation of  BP and general medical  condition  F/U surgical evaluation. May consider pending follow-up evaluations  F/U neurological evaluation. May consider pending follow-up evaluations  May consider radiofrequency rhizolysis or intraspinal procedures pending response to present treatment and F/U evaluation   Patient to call Pain Management Center should patient have concerns prior to scheduled return appointment.Sacroiliac (SI) Joint Injection Patient Information  Description: The sacroiliac joint connects the scrum (very low back and tailbone) to the ilium (a pelvic bone which also forms half of the hip joint).  Normally this joint experiences very little motion.  When this joint becomes inflamed or unstable low back and or hip and pelvis pain may result.  Injection of this joint with local anesthetics (numbing medicines) and steroids can provide diagnostic information and reduce pain.  This injection is performed with the aid of x-ray guidance into the tailbone area while you are lying on your stomach.   You may experience an electrical sensation down the leg while this is being done.  You may also experience numbness.  We also may ask if we are reproducing your normal pain during the injection.  Conditions which may be treated SI injection:   Low back, buttock, hip or leg pain  Preparation for the Injection:  1. Do not eat any solid food or dairy products within 6 hours of your appointment.  2. You may drink clear liquids up to 2 hours before appointment.  Clear liquids include water, black coffee, juice or soda.  No milk or cream please. 3. You may take your regular medications, including pain medications with a sip of water before your appointment.  Diabetics should  hold regular insulin (if take separately) and take 1/2 normal NPH dose the morning of the procedure.  Carry some sugar containing items with you to your appointment. 4. A driver must accompany you and be prepared to drive you home after your procedure. 5. Bring all of your current medications with you. 6. An IV may be inserted and sedation may be given at the discretion of the physician. 7. A blood pressure cuff, EKG and other monitors will often be applied during the procedure.  Some patients may need to have extra oxygen administered for a short period.  8. You will be asked to provide medical information, including your allergies, prior to the procedure.  We must know immediately if you are taking blood thinners (like Coumadin/Warfarin) or if you are allergic to IV iodine contrast (dye).  We must know if you could possible be pregnant.  Possible side effects:   Bleeding from needle site  Infection (rare, may require surgery)  Nerve injury (rare)  Numbness & tingling (temporary)  A brief convulsion or seizure  Light-headedness (temporary)  Pain at injection site (several days)  Decreased blood pressure (temporary)  Weakness in the leg (temporary)   Call if you experience:   New onset weakness or numbness of an extremity below the injection site that last more than 8 hours.  Hives or difficulty breathing ( go to the emergency room)  Inflammation or drainage at the injection site  Any new symptoms which are concerning to you  Please note:  Although the local anesthetic injected can often make your back/ hip/ buttock/ leg feel good for several hours  after the injections, the pain will likely return.  It takes 3-7 days for steroids to work in the sacroiliac area.  You may not notice any pain relief for at least that one week.  If effective, we will often do a series of three injections spaced 3-6 weeks apart to maximally decrease your pain.  After the initial series, we  generally will wait some months before a repeat injection of the same type.  If you have any questions, please call [phone] [place]  What are the risk, side effects and possible complications? Generally speaking, most procedures are safe.  However, with any procedure there are risks, side effects, and the possibility of complications.  The risks and complications are dependent upon the sites that are lesioned, or the type of nerve block to be performed.  The closer the procedure is to the spine, the more serious the risks are.  Great care is taken when placing the radio frequency needles, block needles or lesioning probes, but sometimes complications can occur. 1. Infection: Any time there is an injection through the skin, there is a risk of infection.  This is why sterile conditions are used for these blocks.  There are four possible types of infection. 1. Localized skin infection. 2. Central Nervous System Infection-This can be in the form of Meningitis, which can be deadly. 3. Epidural Infections-This can be in the form of an epidural abscess, which can cause pressure inside of the spine, causing compression of the spinal cord with subsequent paralysis. This would require an emergency surgery to decompress, and there are no guarantees that the patient would recover from the paralysis. 4. Discitis-This is an infection of the intervertebral discs.  It occurs in about 1% of discography procedures.  It is difficult to treat and it may lead to surgery.        2. Pain: the needles have to go through skin and soft tissues, will cause soreness.       3. Damage to internal structures:  The nerves to be lesioned may be near blood vessels or    other nerves which can be potentially damaged.       4. Bleeding: Bleeding is more common if the patient is taking blood thinners such as  aspirin, Coumadin, Ticiid, Plavix, etc., or if  he/she have some genetic predisposition  such as hemophilia. Bleeding into the spinal canal can cause compression of the spinal  cord with subsequent paralysis.  This would require an emergency surgery to  decompress and there are no guarantees that the patient would recover from the  paralysis.       5. Pneumothorax:  Puncturing of a lung is a possibility, every time a needle is introduced in  the area of the chest or upper back.  Pneumothorax refers to free air around the  collapsed lung(s), inside of the thoracic cavity (chest cavity).  Another two possible  complications related to a similar event would include: Hemothorax and Chylothorax.   These are variations of the Pneumothorax, where instead of air around the collapsed  lung(s), you may have blood or chyle, respectively.       6. Spinal headaches: They may occur with any procedures in the area of the spine.       7. Persistent CSF (Cerebro-Spinal Fluid) leakage: This is a rare problem, but may occur  with prolonged intrathecal or epidural catheters either due to the formation of  a fistulous  track or a dural tear.       8. Nerve damage: By working so close to the spinal cord, there is always a possibility of  nerve damage, which could be as serious as a permanent spinal cord injury with  paralysis.       9. Death:  Although rare, severe deadly allergic reactions known as "Anaphylactic  reaction" can occur to any of the medications used.      10. Worsening of the symptoms:  We can always make thing worse.  What are the chances of something like this happening? Chances of any of this occuring are extremely low.  By statistics, you have more of a chance of getting killed in a motor vehicle accident: while driving to the hospital than any of the above occurring .  Nevertheless, you should be aware that they are possibilities.  In general, it is similar to taking a shower.  Everybody knows that you can slip, hit your head and get killed.  Does that mean  that you should not shower again?  Nevertheless always keep in mind that statistics do not mean anything if you happen to be on the wrong side of them.  Even if a procedure has a 1 (one) in a 1,000,000 (million) chance of going wrong, it you happen to be that one..Also, keep in mind that by statistics, you have more of a chance of having something go wrong when taking medications.  Who should not have this procedure? If you are on a blood thinning medication (e.g. Coumadin, Plavix, see list of "Blood Thinners"), or if you have an active infection going on, you should not have the procedure.  If you are taking any blood thinners, please inform your physician.  How should I prepare for this procedure?  Do not eat or drink anything at least six hours prior to the procedure.  Bring a driver with you .  It cannot be a taxi.  Come accompanied by an adult that can drive you back, and that is strong enough to help you if your legs get weak or numb from the local anesthetic.  Take all of your medicines the morning of the procedure with just enough water to swallow them.  If you have diabetes, make sure that you are scheduled to have your procedure done first thing in the morning, whenever possible.  If you have diabetes, take only half of your insulin dose and notify our nurse that you have done so as soon as you arrive at the clinic.  If you are diabetic, but only take blood sugar pills (oral hypoglycemic), then do not take them on the morning of your procedure.  You may take them after you have had the procedure.  Do not take aspirin or any aspirin-containing medications, at least eleven (11) days prior to the procedure.  They may prolong bleeding.  Wear loose fitting clothing that may be easy to take off and that you would not mind if it got stained with Betadine or blood.  Do not wear any jewelry or perfume  Remove any nail coloring.  It will interfere with some of our monitoring  equipment.  NOTE: Remember that this is not meant to be interpreted as a complete list of all possible complications.  Unforeseen problems may occur.  BLOOD THINNERS The following drugs contain aspirin or other products, which can cause increased bleeding during surgery and should not be taken for 2 weeks prior to and 1  week after surgery.  If you should need take something for relief of minor pain, you may take acetaminophen which is found in Tylenol,m Datril, Anacin-3 and Panadol. It is not blood thinner. The products listed below are.  Do not take any of the products listed below in addition to any listed on your instruction sheet.  A.P.C or A.P.C with Codeine Codeine Phosphate Capsules #3 Ibuprofen Ridaura  ABC compound Congesprin Imuran rimadil  Advil Cope Indocin Robaxisal  Alka-Seltzer Effervescent Pain Reliever and Antacid Coricidin or Coricidin-D  Indomethacin Rufen  Alka-Seltzer plus Cold Medicine Cosprin Ketoprofen S-A-C Tablets  Anacin Analgesic Tablets or Capsules Coumadin Korlgesic Salflex  Anacin Extra Strength Analgesic tablets or capsules CP-2 Tablets Lanoril Salicylate  Anaprox Cuprimine Capsules Levenox Salocol  Anexsia-D Dalteparin Magan Salsalate  Anodynos Darvon compound Magnesium Salicylate Sine-off  Ansaid Dasin Capsules Magsal Sodium Salicylate  Anturane Depen Capsules Marnal Soma  APF Arthritis pain formula Dewitt's Pills Measurin Stanback  Argesic Dia-Gesic Meclofenamic Sulfinpyrazone  Arthritis Bayer Timed Release Aspirin Diclofenac Meclomen Sulindac  Arthritis pain formula Anacin Dicumarol Medipren Supac  Analgesic (Safety coated) Arthralgen Diffunasal Mefanamic Suprofen  Arthritis Strength Bufferin Dihydrocodeine Mepro Compound Suprol  Arthropan liquid Dopirydamole Methcarbomol with Aspirin Synalgos  ASA tablets/Enseals Disalcid Micrainin Tagament  Ascriptin Doan's Midol Talwin  Ascriptin A/D Dolene Mobidin Tanderil  Ascriptin Extra Strength Dolobid  Moblgesic Ticlid  Ascriptin with Codeine Doloprin or Doloprin with Codeine Momentum Tolectin  Asperbuf Duoprin Mono-gesic Trendar  Aspergum Duradyne Motrin or Motrin IB Triminicin  Aspirin plain, buffered or enteric coated Durasal Myochrisine Trigesic  Aspirin Suppositories Easprin Nalfon Trillsate  Aspirin with Codeine Ecotrin Regular or Extra Strength Naprosyn Uracel  Atromid-S Efficin Naproxen Ursinus  Auranofin Capsules Elmiron Neocylate Vanquish  Axotal Emagrin Norgesic Verin  Azathioprine Empirin or Empirin with Codeine Normiflo Vitamin E  Azolid Emprazil Nuprin Voltaren  Bayer Aspirin plain, buffered or children's or timed BC Tablets or powders Encaprin Orgaran Warfarin Sodium  Buff-a-Comp Enoxaparin Orudis Zorpin  Buff-a-Comp with Codeine Equegesic Os-Cal-Gesic   Buffaprin Excedrin plain, buffered or Extra Strength Oxalid   Bufferin Arthritis Strength Feldene Oxphenbutazone   Bufferin plain or Extra Strength Feldene Capsules Oxycodone with Aspirin   Bufferin with Codeine Fenoprofen Fenoprofen Pabalate or Pabalate-SF   Buffets II Flogesic Panagesic   Buffinol plain or Extra Strength Florinal or Florinal with Codeine Panwarfarin   Buf-Tabs Flurbiprofen Penicillamine   Butalbital Compound Four-way cold tablets Penicillin   Butazolidin Fragmin Pepto-Bismol   Carbenicillin Geminisyn Percodan   Carna Arthritis Reliever Geopen Persantine   Carprofen Gold's salt Persistin   Chloramphenicol Goody's Phenylbutazone   Chloromycetin Haltrain Piroxlcam   Clmetidine heparin Plaquenil   Cllnoril Hyco-pap Ponstel   Clofibrate Hydroxy chloroquine Propoxyphen         Before stopping any of these medications, be sure to consult the physician who ordered them.  Some, such as Coumadin (Warfarin) are ordered to prevent or treat serious conditions such as "deep thrombosis", "pumonary embolisms", and other heart problems.  The amount of time that you may need off of the medication may also vary with  the medication and the reason for which you were taking it.  If you are taking any of these medications, please make sure you notify your pain physician before you undergo any procedures.

## 2015-01-30 ENCOUNTER — Ambulatory Visit: Payer: Medicare Other | Admitting: Pain Medicine

## 2015-01-30 DIAGNOSIS — Z23 Encounter for immunization: Secondary | ICD-10-CM | POA: Diagnosis not present

## 2015-02-06 ENCOUNTER — Other Ambulatory Visit: Payer: Self-pay | Admitting: Pain Medicine

## 2015-02-15 ENCOUNTER — Ambulatory Visit: Payer: Medicare Other | Admitting: Pain Medicine

## 2015-02-22 ENCOUNTER — Encounter: Payer: Self-pay | Admitting: Pain Medicine

## 2015-02-22 ENCOUNTER — Encounter: Payer: Medicare Other | Admitting: Pain Medicine

## 2015-02-22 ENCOUNTER — Ambulatory Visit: Payer: Medicare Other | Attending: Pain Medicine | Admitting: Pain Medicine

## 2015-02-22 VITALS — BP 108/54 | HR 102 | Temp 97.9°F | Resp 18 | Ht 65.0 in | Wt 150.0 lb

## 2015-02-22 DIAGNOSIS — M5136 Other intervertebral disc degeneration, lumbar region: Secondary | ICD-10-CM | POA: Diagnosis not present

## 2015-02-22 DIAGNOSIS — M419 Scoliosis, unspecified: Secondary | ICD-10-CM | POA: Diagnosis not present

## 2015-02-22 DIAGNOSIS — M533 Sacrococcygeal disorders, not elsewhere classified: Secondary | ICD-10-CM

## 2015-02-22 DIAGNOSIS — M47816 Spondylosis without myelopathy or radiculopathy, lumbar region: Secondary | ICD-10-CM

## 2015-02-22 DIAGNOSIS — M47817 Spondylosis without myelopathy or radiculopathy, lumbosacral region: Secondary | ICD-10-CM | POA: Diagnosis not present

## 2015-02-22 DIAGNOSIS — M79606 Pain in leg, unspecified: Secondary | ICD-10-CM | POA: Diagnosis present

## 2015-02-22 DIAGNOSIS — Z9889 Other specified postprocedural states: Secondary | ICD-10-CM | POA: Insufficient documentation

## 2015-02-22 DIAGNOSIS — M87052 Idiopathic aseptic necrosis of left femur: Secondary | ICD-10-CM

## 2015-02-22 DIAGNOSIS — M545 Low back pain: Secondary | ICD-10-CM | POA: Diagnosis present

## 2015-02-22 DIAGNOSIS — M51369 Other intervertebral disc degeneration, lumbar region without mention of lumbar back pain or lower extremity pain: Secondary | ICD-10-CM

## 2015-02-22 DIAGNOSIS — M87051 Idiopathic aseptic necrosis of right femur: Secondary | ICD-10-CM

## 2015-02-22 DIAGNOSIS — Z96643 Presence of artificial hip joint, bilateral: Secondary | ICD-10-CM

## 2015-02-22 MED ORDER — LACTATED RINGERS IV SOLN: 1000.0000 mL | INTRAVENOUS | Status: DC

## 2015-02-22 MED ORDER — FENTANYL CITRATE (PF) 100 MCG/2ML IJ SOLN
100.0000 ug | Freq: Once | INTRAMUSCULAR | Status: AC
  Administered 2015-02-22: 100 ug via INTRAVENOUS

## 2015-02-22 MED ORDER — CIPROFLOXACIN IN D5W 400 MG/200ML IV SOLN
INTRAVENOUS | Status: AC
  Administered 2015-02-22: 400 mg via INTRAVENOUS
  Filled 2015-02-22: qty 200

## 2015-02-22 MED ORDER — ORPHENADRINE CITRATE 30 MG/ML IJ SOLN
60.0000 mg | Freq: Once | INTRAMUSCULAR | Status: AC
  Administered 2015-02-22: 60 mg via INTRAMUSCULAR

## 2015-02-22 MED ORDER — KETOROLAC TROMETHAMINE 30 MG/ML IJ SOLN
INTRAMUSCULAR | Status: AC
  Administered 2015-02-22: 30 mg
  Filled 2015-02-22: qty 1

## 2015-02-22 MED ORDER — TRIAMCINOLONE ACETONIDE 40 MG/ML IJ SUSP: 40.0000 mg | Freq: Once | INTRAMUSCULAR | Status: DC

## 2015-02-22 MED ORDER — BUPIVACAINE HCL (PF) 0.25 % IJ SOLN
INTRAMUSCULAR | Status: AC
  Administered 2015-02-22: 30 mL
  Filled 2015-02-22: qty 30

## 2015-02-22 MED ORDER — FENTANYL CITRATE (PF) 100 MCG/2ML IJ SOLN
INTRAMUSCULAR | Status: AC
  Administered 2015-02-22: 100 ug via INTRAVENOUS
  Filled 2015-02-22: qty 2

## 2015-02-22 MED ORDER — MIDAZOLAM HCL 5 MG/5ML IJ SOLN
5.0000 mg | Freq: Once | INTRAMUSCULAR | Status: AC
  Administered 2015-02-22: 5 mg via INTRAVENOUS

## 2015-02-22 MED ORDER — ORPHENADRINE CITRATE 30 MG/ML IJ SOLN
INTRAMUSCULAR | Status: AC
  Administered 2015-02-22: 60 mg via INTRAMUSCULAR
  Filled 2015-02-22: qty 2

## 2015-02-22 MED ORDER — OXYMORPHONE HCL ER 20 MG PO T12A: EXTENDED_RELEASE_TABLET | ORAL | Status: DC

## 2015-02-22 MED ORDER — OXYCODONE-ACETAMINOPHEN 10-325 MG PO TABS: ORAL_TABLET | ORAL | Status: DC

## 2015-02-22 MED ORDER — TRIAMCINOLONE ACETONIDE 40 MG/ML IJ SUSP
INTRAMUSCULAR | Status: AC
  Filled 2015-02-22: qty 1

## 2015-02-22 MED ORDER — MIDAZOLAM HCL 5 MG/5ML IJ SOLN
INTRAMUSCULAR | Status: AC
  Administered 2015-02-22: 5 mg via INTRAVENOUS
  Filled 2015-02-22: qty 5

## 2015-02-22 MED ORDER — BUPIVACAINE HCL (PF) 0.25 % IJ SOLN
30.0000 mL | Freq: Once | INTRAMUSCULAR | Status: AC
  Administered 2015-02-22: 30 mL

## 2015-02-22 MED ORDER — CIPROFLOXACIN IN D5W 400 MG/200ML IV SOLN
400.0000 mg | Freq: Once | INTRAVENOUS | Status: AC
  Administered 2015-02-22: 400 mg via INTRAVENOUS

## 2015-02-22 NOTE — Patient Instructions (Addendum)
PLAN   Continue present medications Cymbalta oxycodone acetaminophen and oxymorphone extended release  F/U PCP C Wicker for evaliation of  BP and general medical  condition  F/U surgical evaluation. May consider pending follow-up evaluations  F/U neurological evaluation. May consider pending follow-up evaluations  May consider radiofrequency rhizolysis or intraspinal procedures pending response to present treatment and F/U evaluation   Patient to call Pain Management Center should patient have concerns prior to scheduled return appointment. A prescription for Cipro was sent to your pharmacy. Pain Management Discharge Instructions  General Discharge Instructions :  If you need to reach your doctor call: Monday-Friday 8:00 am - 4:00 pm at [phone] or toll free [phone].  After clinic hours [phone] to have operator reach doctor.  Bring all of your medication bottles to all your appointments in the pain clinic.  To cancel or reschedule your appointment with Pain Management please remember to call 24 hours in advance to avoid a fee.  Refer to the educational materials which you have been given on: General Risks, I had my Procedure. Discharge Instructions, Post Sedation.  Post Procedure Instructions:  The drugs you were given will stay in your system until tomorrow, so for the next 24 hours you should not drive, make any legal decisions or drink any alcoholic beverages.  You may eat anything you prefer, but it is better to start with liquids then soups and crackers, and gradually work up to solid foods.  Please notify your doctor immediately if you have any unusual bleeding, trouble breathing or pain that is not related to your normal pain.  Depending on the type of procedure that was done, some parts of your body may feel week and/or numb.  This usually clears up by tonight or the next day.  Walk with the use of an assistive device or accompanied by an adult for the 24  hours.  You may use ice on the affected area for the first 24 hours.  Put ice in a Ziploc bag and cover with a towel and place against area 15 minutes on 15 minutes off.  You may switch to heat after 24 hours.

## 2015-02-22 NOTE — Progress Notes (Signed)
Safety precautions to be maintained throughout the outpatient stay will include: orient to surroundings, keep bed in low position, maintain call bell within reach at all times, provide assistance with transfer out of bed and ambulation.  

## 2015-02-22 NOTE — Progress Notes (Signed)
Subjective:    Patient ID: Amy Rocha, female    DOB: September 05, 1955, 60 y.o.   MRN: [id number]  HPI  PROCEDURE:  Block of nerves to the sacroiliac joint.   NOTE:  The patient is a 60 y.o. female who [facility] for further evaluation and treatment of pain involving the lower back and lower extremity region with pain in the region of the buttocks as well. Prior MRI studies reveal Degenerative disc disease lumbar spine Convex left scoliosis. Degenerative disc disease with facet hypertrophy at multiple levels involving L4-5 and L5-S1 predominantly. Mild disc bulging at L1-2 L4-5.Marland Kitchen The patient is also status post surgery of the hip.   . The patient is with reproduction of severe pain with palpation over the PSIS and PII S regions. There is concern regarding a significant component of the patient's pain being due to sacroiliac joint dysfunction The risks, benefits, expectations of the procedure have been discussed and explained to the patient who is understanding and willing to proceed with interventional treatment in attempt to decrease severity of patient's symptoms, minimize the risk of medication escalation and  hopefully retard the progression of the patient's symptoms. We will proceed with what is felt to be a medically necessary procedure, block of nerves to the sacroiliac joint.   DESCRIPTION OF PROCEDURE:  Block of nerves to the sacroiliac joint.   The patient was taken to the fluoroscopy suite. With the patient in the prone position with EKG, blood pressure, pulse and pulse oximetry monitoring, IV Versed, IV fentanyl conscious sedation, Betadine prep of proposed entry site was performed.   Block of nerves at the L5 vertebral body level.   With the patient in prone position, under fluoroscopic guidance, a 22 -gauge needle was inserted at the L5 vertebral body level on the left side. With 15 degrees oblique orientation a 22 -gauge needle was inserted in the region  known as Burton's eye or eye of the Scotty dog. Following documentation of needle placement in the area of Burton's eye or eye of the Scotty dog under fluoroscopic guidance, needle placement was then accomplished at the sacral ala level on the left side.   Needle placement at the sacral ala.   With the patient in prone position under fluoroscopic guidance with AP view of the lumbosacral spine, a 22 -gauge needle was inserted in the region known as the sacral ala on the left side. Following documentation of needle placement on the left side under fluoroscopic guidance needle placement was then accomplished at the S1 foramen level.   Needle placement at the S1 foramen level.   With the patient in prone position under fluoroscopic guidance with AP view of the lumbosacral spine and cephalad orientation, a 22 -gauge needle was inserted at the superior and lateral border of the S1 foramen on the left side. Following documentation of needle placement at the S1 foramen level on the left side, needle placement was then accomplished at the S2 foramen level on the left side.   Needle placement at the S2 foramen level.   With the patient in prone position with AP view of the lumbosacral spine with cephalad orientation, a 22 - gauge needle was inserted at the superior and lateral border of the S2 foramen under fluoroscopic guidance on the left side. Following needle placement at the L5 vertebral body level, sacral ala, S1 foramen and S2 foramen on the left side, needle placement was verified on lateral view under fluoroscopic guidance.  Following needle placement documentation on lateral view, each needle was injected with 1 mL of 0.25% bupivacaine.   BLOCK OF THE NERVES TO SACROILIAC JOINT ON THE RIGHT SIDE The procedure was performed on the right side at the same levels as was performed on the left side and utilizing the same technique as on the left side and was performed under fluoroscopic guidance as on the  left side  The patient tolerated procedure well    PLAN:  1. Medications: The patient will continue presently prescribed medications. Cymbalta Oxycodone acetaminophen and Opana ER  2. The patient will be considered for modification of treatment regimen pending response to the procedure performed on today's visit.  3. The patient is to follow-up with primary care physician C Wicker  for evaluation of blood pressure and general medical condition following the procedure performed on today's visit.  4. Surgical evaluation as discussed.  5. Neurological evaluation as discussed.  6. The patient may be a candidate for radiofrequency procedures, implantation devices and other treatment pending response to treatment performed on today's visit and follow-up evaluation.  7. The patient has been advised to adhere to proper body mechanics and to avoid activities which may exacerbate the patient's symptoms.   Return appointment to [facility] as scheduled.      Review of Systems     Objective:   Physical Exam        Assessment & Plan:

## 2015-02-23 ENCOUNTER — Telehealth: Payer: Self-pay | Admitting: *Deleted

## 2015-02-23 NOTE — Telephone Encounter (Signed)
Spoke with patient, verbalizes no c/o from procedure on yesterday.

## 2015-03-23 ENCOUNTER — Encounter: Payer: Self-pay | Admitting: Pain Medicine

## 2015-03-23 ENCOUNTER — Ambulatory Visit: Payer: Medicare Other | Attending: Pain Medicine | Admitting: Pain Medicine

## 2015-03-23 VITALS — BP 147/86 | HR 100 | Temp 98.1°F | Resp 16 | Ht 65.0 in | Wt 155.0 lb

## 2015-03-23 DIAGNOSIS — Z9689 Presence of other specified functional implants: Secondary | ICD-10-CM | POA: Insufficient documentation

## 2015-03-23 DIAGNOSIS — M5126 Other intervertebral disc displacement, lumbar region: Secondary | ICD-10-CM | POA: Diagnosis not present

## 2015-03-23 DIAGNOSIS — M533 Sacrococcygeal disorders, not elsewhere classified: Secondary | ICD-10-CM | POA: Diagnosis not present

## 2015-03-23 DIAGNOSIS — M545 Low back pain: Secondary | ICD-10-CM | POA: Diagnosis present

## 2015-03-23 DIAGNOSIS — M5416 Radiculopathy, lumbar region: Secondary | ICD-10-CM | POA: Diagnosis not present

## 2015-03-23 DIAGNOSIS — M419 Scoliosis, unspecified: Secondary | ICD-10-CM | POA: Insufficient documentation

## 2015-03-23 DIAGNOSIS — G5600 Carpal tunnel syndrome, unspecified upper limb: Secondary | ICD-10-CM | POA: Insufficient documentation

## 2015-03-23 DIAGNOSIS — M47817 Spondylosis without myelopathy or radiculopathy, lumbosacral region: Secondary | ICD-10-CM | POA: Diagnosis not present

## 2015-03-23 DIAGNOSIS — M47816 Spondylosis without myelopathy or radiculopathy, lumbar region: Secondary | ICD-10-CM

## 2015-03-23 DIAGNOSIS — M79606 Pain in leg, unspecified: Secondary | ICD-10-CM | POA: Diagnosis present

## 2015-03-23 DIAGNOSIS — M5136 Other intervertebral disc degeneration, lumbar region: Secondary | ICD-10-CM | POA: Insufficient documentation

## 2015-03-23 DIAGNOSIS — M87052 Idiopathic aseptic necrosis of left femur: Secondary | ICD-10-CM

## 2015-03-23 DIAGNOSIS — M791 Myalgia: Secondary | ICD-10-CM | POA: Diagnosis not present

## 2015-03-23 DIAGNOSIS — M879 Osteonecrosis, unspecified: Secondary | ICD-10-CM | POA: Insufficient documentation

## 2015-03-23 DIAGNOSIS — Z96643 Presence of artificial hip joint, bilateral: Secondary | ICD-10-CM | POA: Insufficient documentation

## 2015-03-23 DIAGNOSIS — M87051 Idiopathic aseptic necrosis of right femur: Secondary | ICD-10-CM

## 2015-03-23 MED ORDER — OXYMORPHONE HCL ER 20 MG PO T12A: EXTENDED_RELEASE_TABLET | ORAL | Status: DC

## 2015-03-23 MED ORDER — AZITHROMYCIN 250 MG PO TABS: ORAL_TABLET | ORAL | Status: DC

## 2015-03-23 MED ORDER — DICLOFENAC SODIUM 1 % TD GEL: TRANSDERMAL | Status: DC

## 2015-03-23 MED ORDER — OXYCODONE-ACETAMINOPHEN 10-325 MG PO TABS: ORAL_TABLET | ORAL | Status: DC

## 2015-03-23 NOTE — Patient Instructions (Addendum)
PLAN   Continue present medications Cymbalta oxycodone acetaminophen and oxymorphone extended release  . Begin Voltaren gel as prescribed. Also get Z-Pak and follow-up with PCP C Wicker as discussed   Block of nerves to the sacroiliac joint to be performed at time return appointment  F/U PCP C Wicker for evaliation of  BP URI symptoms and general medical  condition as discussed. Begin Z-Pak as prescribed  F/U surgical evaluation. May consider pending follow-up evaluations  F/U neurological evaluation. May consider pending follow-up evaluations  May consider radiofrequency rhizolysis or intraspinal procedures pending response to present treatment and F/U evaluation   Patient to call [facility] should patient have concerns prior to scheduled return appointment.Pain Management Discharge Instructions  General Discharge Instructions :  If you need to reach your doctor call: Monday-Friday 8:00 am - 4:00 pm at [phone] or toll free [phone].  After clinic hours [phone] to have operator reach doctor.  Bring all of your medication bottles to all your appointments in the pain clinic.  To cancel or reschedule your appointment with Pain Management please remember to call 24 hours in advance to avoid a fee.  Refer to the educational materials which you have been given on: General Risks, I had my Procedure. Discharge Instructions, Post Sedation.  Post Procedure Instructions:  The drugs you were given will stay in your system until tomorrow, so for the next 24 hours you should not drive, make any legal decisions or drink any alcoholic beverages.  You may eat anything you prefer, but it is better to start with liquids then soups and crackers, and gradually work up to solid foods.  Please notify your doctor immediately if you have any unusual bleeding, trouble breathing or pain that is not related to your normal pain.  Depending on the type of procedure that was done,  some parts of your body may feel week and/or numb.  This usually clears up by tonight or the next day.  Walk with the use of an assistive device or accompanied by an adult for the 24 hours.  You may use ice on the affected area for the first 24 hours.  Put ice in a Ziploc bag and cover with a towel and place against area 15 minutes on 15 minutes off.  You may switch to heat after 24 hours.Sacroiliac (SI) Joint Injection Patient Information  Description: The sacroiliac joint connects the scrum (very low back and tailbone) to the ilium (a pelvic bone which also forms half of the hip joint).  Normally this joint experiences very little motion.  When this joint becomes inflamed or unstable low back and or hip and pelvis pain may result.  Injection of this joint with local anesthetics (numbing medicines) and steroids can provide diagnostic information and reduce pain.  This injection is performed with the aid of x-ray guidance into the tailbone area while you are lying on your stomach.   You may experience an electrical sensation down the leg while this is being done.  You may also experience numbness.  We also may ask if we are reproducing your normal pain during the injection.  Conditions which may be treated SI injection:   Low back, buttock, hip or leg pain  Preparation for the Injection:  1. Do not eat any solid food or dairy products within 6 hours of your appointment.  2. You may drink clear liquids up to 2 hours before appointment.  Clear liquids include water, black coffee, juice or soda.  No  milk or cream please. 3. You may take your regular medications, including pain medications with a sip of water before your appointment.  Diabetics should hold regular insulin (if take separately) and take 1/2 normal NPH dose the morning of the procedure.  Carry some sugar containing items with you to your appointment. 4. A driver must accompany you and be prepared to drive you home after your  procedure. 5. Bring all of your current medications with you. 6. An IV may be inserted and sedation may be given at the discretion of the physician. 7. A blood pressure cuff, EKG and other monitors will often be applied during the procedure.  Some patients may need to have extra oxygen administered for a short period.  8. You will be asked to provide medical information, including your allergies, prior to the procedure.  We must know immediately if you are taking blood thinners (like Coumadin/Warfarin) or if you are allergic to IV iodine contrast (dye).  We must know if you could possible be pregnant.  Possible side effects:   Bleeding from needle site  Infection (rare, may require surgery)  Nerve injury (rare)  Numbness & tingling (temporary)  A brief convulsion or seizure  Light-headedness (temporary)  Pain at injection site (several days)  Decreased blood pressure (temporary)  Weakness in the leg (temporary)   Call if you experience:   New onset weakness or numbness of an extremity below the injection site that last more than 8 hours.  Hives or difficulty breathing ( go to the emergency room)  Inflammation or drainage at the injection site  Any new symptoms which are concerning to you  Please note:  Although the local anesthetic injected can often make your back/ hip/ buttock/ leg feel good for several hours after the injections, the pain will likely return.  It takes 3-7 days for steroids to work in the sacroiliac area.  You may not notice any pain relief for at least that one week.  If effective, we will often do a series of three injections spaced 3-6 weeks apart to maximally decrease your pain.  After the initial series, we generally will wait some months before a repeat injection of the same type.  If you have any questions, please call [phone] [place]  What are the risk, side  effects and possible complications? Generally speaking, most procedures are safe.  However, with any procedure there are risks, side effects, and the possibility of complications.  The risks and complications are dependent upon the sites that are lesioned, or the type of nerve block to be performed.  The closer the procedure is to the spine, the more serious the risks are.  Great care is taken when placing the radio frequency needles, block needles or lesioning probes, but sometimes complications can occur. 1. Infection: Any time there is an injection through the skin, there is a risk of infection.  This is why sterile conditions are used for these blocks.  There are four possible types of infection. 1. Localized skin infection. 2. Central Nervous System Infection-This can be in the form of Meningitis, which can be deadly. 3. Epidural Infections-This can be in the form of an epidural abscess, which can cause pressure inside of the spine, causing compression of the spinal cord with subsequent paralysis. This would require an emergency surgery to decompress, and there are no guarantees that the patient would recover from the paralysis. 4. Discitis-This is an  infection of the intervertebral discs.  It occurs in about 1% of discography procedures.  It is difficult to treat and it may lead to surgery.        2. Pain: the needles have to go through skin and soft tissues, will cause soreness.       3. Damage to internal structures:  The nerves to be lesioned may be near blood vessels or    other nerves which can be potentially damaged.       4. Bleeding: Bleeding is more common if the patient is taking blood thinners such as  aspirin, Coumadin, Ticiid, Plavix, etc., or if he/she have some genetic predisposition  such as hemophilia. Bleeding into the spinal canal can cause compression of the spinal  cord with subsequent paralysis.  This would require an emergency surgery to  decompress and there are no guarantees  that the patient would recover from the  paralysis.       5. Pneumothorax:  Puncturing of a lung is a possibility, every time a needle is introduced in  the area of the chest or upper back.  Pneumothorax refers to free air around the  collapsed lung(s), inside of the thoracic cavity (chest cavity).  Another two possible  complications related to a similar event would include: Hemothorax and Chylothorax.   These are variations of the Pneumothorax, where instead of air around the collapsed  lung(s), you may have blood or chyle, respectively.       6. Spinal headaches: They may occur with any procedures in the area of the spine.       7. Persistent CSF (Cerebro-Spinal Fluid) leakage: This is a rare problem, but may occur  with prolonged intrathecal or epidural catheters either due to the formation of a fistulous  track or a dural tear.       8. Nerve damage: By working so close to the spinal cord, there is always a possibility of  nerve damage, which could be as serious as a permanent spinal cord injury with  paralysis.       9. Death:  Although rare, severe deadly allergic reactions known as "Anaphylactic  reaction" can occur to any of the medications used.      10. Worsening of the symptoms:  We can always make thing worse.  What are the chances of something like this happening? Chances of any of this occuring are extremely low.  By statistics, you have more of a chance of getting killed in a motor vehicle accident: while driving to the hospital than any of the above occurring .  Nevertheless, you should be aware that they are possibilities.  In general, it is similar to taking a shower.  Everybody knows that you can slip, hit your head and get killed.  Does that mean that you should not shower again?  Nevertheless always keep in mind that statistics do not mean anything if you happen to be on the wrong side of them.  Even if a procedure has a 1 (one) in a 1,000,000 (million) chance of going wrong, it you  happen to be that one..Also, keep in mind that by statistics, you have more of a chance of having something go wrong when taking medications.  Who should not have this procedure? If you are on a blood thinning medication (e.g. Coumadin, Plavix, see list of "Blood Thinners"), or if you have an active infection going on, you should not have the procedure.  If you are taking any  blood thinners, please inform your physician.  How should I prepare for this procedure?  Do not eat or drink anything at least six hours prior to the procedure.  Bring a driver with you .  It cannot be a taxi.  Come accompanied by an adult that can drive you back, and that is strong enough to help you if your legs get weak or numb from the local anesthetic.  Take all of your medicines the morning of the procedure with just enough water to swallow them.  If you have diabetes, make sure that you are scheduled to have your procedure done first thing in the morning, whenever possible.  If you have diabetes, take only half of your insulin dose and notify our nurse that you have done so as soon as you arrive at the clinic.  If you are diabetic, but only take blood sugar pills (oral hypoglycemic), then do not take them on the morning of your procedure.  You may take them after you have had the procedure.  Do not take aspirin or any aspirin-containing medications, at least eleven (11) days prior to the procedure.  They may prolong bleeding.  Wear loose fitting clothing that may be easy to take off and that you would not mind if it got stained with Betadine or blood.  Do not wear any jewelry or perfume  Remove any nail coloring.  It will interfere with some of our monitoring equipment.  NOTE: Remember that this is not meant to be interpreted as a complete list of all possible complications.  Unforeseen problems may occur.  BLOOD THINNERS The following drugs contain aspirin or other products, which can cause increased  bleeding during surgery and should not be taken for 2 weeks prior to and 1 week after surgery.  If you should need take something for relief of minor pain, you may take acetaminophen which is found in Tylenol,m Datril, Anacin-3 and Panadol. It is not blood thinner. The products listed below are.  Do not take any of the products listed below in addition to any listed on your instruction sheet.  A.P.C or A.P.C with Codeine Codeine Phosphate Capsules #3 Ibuprofen Ridaura  ABC compound Congesprin Imuran rimadil  Advil Cope Indocin Robaxisal  Alka-Seltzer Effervescent Pain Reliever and Antacid Coricidin or Coricidin-D  Indomethacin Rufen  Alka-Seltzer plus Cold Medicine Cosprin Ketoprofen S-A-C Tablets  Anacin Analgesic Tablets or Capsules Coumadin Korlgesic Salflex  Anacin Extra Strength Analgesic tablets or capsules CP-2 Tablets Lanoril Salicylate  Anaprox Cuprimine Capsules Levenox Salocol  Anexsia-D Dalteparin Magan Salsalate  Anodynos Darvon compound Magnesium Salicylate Sine-off  Ansaid Dasin Capsules Magsal Sodium Salicylate  Anturane Depen Capsules Marnal Soma  APF Arthritis pain formula Dewitt's Pills Measurin Stanback  Argesic Dia-Gesic Meclofenamic Sulfinpyrazone  Arthritis Bayer Timed Release Aspirin Diclofenac Meclomen Sulindac  Arthritis pain formula Anacin Dicumarol Medipren Supac  Analgesic (Safety coated) Arthralgen Diffunasal Mefanamic Suprofen  Arthritis Strength Bufferin Dihydrocodeine Mepro Compound Suprol  Arthropan liquid Dopirydamole Methcarbomol with Aspirin Synalgos  ASA tablets/Enseals Disalcid Micrainin Tagament  Ascriptin Doan's Midol Talwin  Ascriptin A/D Dolene Mobidin Tanderil  Ascriptin Extra Strength Dolobid Moblgesic Ticlid  Ascriptin with Codeine Doloprin or Doloprin with Codeine Momentum Tolectin  Asperbuf Duoprin Mono-gesic Trendar  Aspergum Duradyne Motrin or Motrin IB Triminicin  Aspirin plain, buffered or enteric coated Durasal Myochrisine Trigesic   Aspirin Suppositories Easprin Nalfon Trillsate  Aspirin with Codeine Ecotrin Regular or Extra Strength Naprosyn Uracel  Atromid-S Efficin Naproxen Ursinus  Auranofin Capsules Elmiron Neocylate Vanquish  Axotal Emagrin Norgesic Verin  Azathioprine Empirin or Empirin with Codeine Normiflo Vitamin E  Azolid Emprazil Nuprin Voltaren  Bayer Aspirin plain, buffered or children's or timed BC Tablets or powders Encaprin Orgaran Warfarin Sodium  Buff-a-Comp Enoxaparin Orudis Zorpin  Buff-a-Comp with Codeine Equegesic Os-Cal-Gesic   Buffaprin Excedrin plain, buffered or Extra Strength Oxalid   Bufferin Arthritis Strength Feldene Oxphenbutazone   Bufferin plain or Extra Strength Feldene Capsules Oxycodone with Aspirin   Bufferin with Codeine Fenoprofen Fenoprofen Pabalate or Pabalate-SF   Buffets II Flogesic Panagesic   Buffinol plain or Extra Strength Florinal or Florinal with Codeine Panwarfarin   Buf-Tabs Flurbiprofen Penicillamine   Butalbital Compound Four-way cold tablets Penicillin   Butazolidin Fragmin Pepto-Bismol   Carbenicillin Geminisyn Percodan   Carna Arthritis Reliever Geopen Persantine   Carprofen Gold's salt Persistin   Chloramphenicol Goody's Phenylbutazone   Chloromycetin Haltrain Piroxlcam   Clmetidine heparin Plaquenil   Cllnoril Hyco-pap Ponstel   Clofibrate Hydroxy chloroquine Propoxyphen         Before stopping any of these medications, be sure to consult the physician who ordered them.  Some, such as Coumadin (Warfarin) are ordered to prevent or treat serious conditions such as "deep thrombosis", "pumonary embolisms", and other heart problems.  The amount of time that you may need off of the medication may also vary with the medication and the reason for which you were taking it.  If you are taking any of these medications, please make sure you notify your pain physician before you undergo any procedures.

## 2015-03-23 NOTE — Progress Notes (Signed)
Safety precautions to be maintained throughout the outpatient stay will include: orient to surroundings, keep bed in low position, maintain call bell within reach at all times, provide assistance with transfer out of bed and ambulation.  

## 2015-03-23 NOTE — Progress Notes (Signed)
Subjective:    Patient ID: Amy Rocha, female    DOB: [birth year], 60 y.o.   MRN: [id number]  HPI  The patient is a 60 year old female who returns to pain management for further evaluation and treatment of pain involving the lower back and lower extremity region predominantly the patient also admits to some pain involving the mid back region with his appeared to be associated with significant muscle spasms. On today's visit patient stated that she had some URI  Symptoms with concern regarding sinus condition. The patient stated that she has responded well to the pack and prednisone in the past. We informed patient that we will prescribe the C back and the patient would need to follow-up with primary care physician C Wicker to obtain the prednisone Dosepak. We also discussed. Lower back and lower extremity pain with pain radiating to the lumbar region for the buttocks on the left as well as on the right. The patient has had significant improvement with block of nerves to the sacroiliac joint which we will perform at time of return appointment. The patient denies any trauma change in events of daily living the call significant change in symptoms pathology. The patient continues of panic ER and oxycodone acetaminophen for breakthrough pain. The patient agreed to suggested treatment plan      Review of Systems     Objective:   Physical Exam  There was tenderness to palpation of paraspinal misreading the cervical region cervical facet region palpation which reproduces mild discomfort with mild tenderness over the splenius capitis and occipitalis muscles region palpation over the region of the cervical facet cervical paraspinal musculature region was with mild tends to palpation. There appeared to be unremarkable Spurling's maneuver. Tinel and Phalen's maneuver were without increase of pain of significant degree. Palpation of the thoracic facet thoracic paraspinal must reason was attends to  palpation of moderate degree in the lower thoracic region. No crepitus of the thoracic region was noted. There was moderate muscle spasms of the lower thoracic paraspinal musculature region. Palpation over the lumbar paraspinal muscles lumbar facet region was with moderate to moderately severe discomfort with lateral bending rotation extension and palpation of the lumbar facets reproducing moderate severe discomfort. There was severe tenderness to palpation over the PSIS and PII S regions as well palpation of the gluteal and piriformis musculature region was with minimal discomfort. Straight leg raise was tolerates approximately 30 without increase of pain with dorsiflexion noted. There was negative clonus negative Homans. DTRs difficult to elicit patient had difficulty relaxing. No definite sensory deficit of dermatomal dystrophy detected. There was negative clonus negative Homans. Abdomen nontender with no costovertebral tenderness noted.      Assessment & Plan:     Degenerative disc disease lumbar spine Convex left scoliosis. Degenerative disc disease with facet hypertrophy at multiple levels involving L4-5 and L5-S1 predominantly. Mild disc bulging at L1-2 L4-5.  Lumbar facet syndrome  Sacroiliac joint dysfunction  Status post bilateral total hip replacement  Avascular necrosis of hips  Status post spinal cord stimulator placement complicated by infection requiring explantation of spinal cord stimulator and healing by secondary intention  Carpal tunnel syndrome (status post fracture of wrist    PLAN   Continue present medications Cymbalta oxycodone acetaminophen and oxymorphone extended release  . Begin Voltaren gel as prescribed. Also get Z-Pak and follow-up with PCP C Wicker as discussed   Block of nerves to the sacroiliac joint to be performed at time return appointment  F/U PCP  C Wicker for evaliation of  BP URI symptoms and general medical  condition as discussed. Begin Z-Pak  as prescribed  F/U surgical evaluation. May consider pending follow-up evaluations  F/U neurological evaluation. May consider pending follow-up evaluations  May consider radiofrequency rhizolysis or intraspinal procedures pending response to present treatment and F/U evaluation   Patient to call [facility] should patient have concerns prior to scheduled return appointment.

## 2015-03-24 ENCOUNTER — Telehealth: Payer: Self-pay

## 2015-03-24 MED ORDER — PREDNISONE 20 MG PO TABS
40.0000 mg | ORAL_TABLET | Freq: Every day | ORAL | Status: DC
Start: 2015-03-24 — End: 2015-03-29

## 2015-03-24 NOTE — Telephone Encounter (Signed)
Patient called and stated she went to Dr. Primus Bravo yesterday and was given an antibiotic and states she needs some prednisone to go with it. Wants a call back from [place] to left her know about prescription.

## 2015-03-24 NOTE — Telephone Encounter (Signed)
Done

## 2015-03-24 NOTE — Telephone Encounter (Signed)
Called and let patient know rx was sent in.

## 2015-03-29 ENCOUNTER — Encounter: Payer: Self-pay | Admitting: Unknown Physician Specialty

## 2015-03-29 ENCOUNTER — Ambulatory Visit (INDEPENDENT_AMBULATORY_CARE_PROVIDER_SITE_OTHER): Payer: Medicare Other | Admitting: Unknown Physician Specialty

## 2015-03-29 VITALS — BP 154/94 | HR 87 | Temp 97.9°F | Ht 63.7 in | Wt 160.6 lb

## 2015-03-29 DIAGNOSIS — R7301 Impaired fasting glucose: Secondary | ICD-10-CM

## 2015-03-29 DIAGNOSIS — I129 Hypertensive chronic kidney disease with stage 1 through stage 4 chronic kidney disease, or unspecified chronic kidney disease: Secondary | ICD-10-CM

## 2015-03-29 DIAGNOSIS — E785 Hyperlipidemia, unspecified: Secondary | ICD-10-CM

## 2015-03-29 DIAGNOSIS — I110 Hypertensive heart disease with heart failure: Secondary | ICD-10-CM | POA: Diagnosis not present

## 2015-03-29 DIAGNOSIS — F329 Major depressive disorder, single episode, unspecified: Secondary | ICD-10-CM | POA: Diagnosis not present

## 2015-03-29 DIAGNOSIS — R110 Nausea: Secondary | ICD-10-CM | POA: Diagnosis not present

## 2015-03-29 DIAGNOSIS — F32A Depression, unspecified: Secondary | ICD-10-CM

## 2015-03-29 DIAGNOSIS — N760 Acute vaginitis: Secondary | ICD-10-CM

## 2015-03-29 LAB — BAYER DCA HB A1C WAIVED: HB A1C: 5.8 % (ref <7.0)

## 2015-03-29 MED ORDER — ATORVASTATIN CALCIUM 40 MG PO TABS: 40.0000 mg | ORAL_TABLET | Freq: Every day | ORAL | Status: DC

## 2015-03-29 MED ORDER — FLUCONAZOLE 100 MG PO TABS: 100.0000 mg | ORAL_TABLET | Freq: Every day | ORAL | Status: DC

## 2015-03-29 MED ORDER — AMLODIPINE BESYLATE 5 MG PO TABS: 5.0000 mg | ORAL_TABLET | Freq: Every day | ORAL | Status: DC

## 2015-03-29 MED ORDER — DULOXETINE HCL 60 MG PO CPEP: 60.0000 mg | ORAL_CAPSULE | Freq: Every day | ORAL | Status: DC

## 2015-03-29 NOTE — Assessment & Plan Note (Signed)
Change Crestor to Atorvastatin

## 2015-03-29 NOTE — Progress Notes (Signed)
 BP 154/94 mmHg  Pulse 87  Temp(Src) 97.9 F (36.6 C)  Ht 5' 3.7" (1.618 m)  Wt 160 lb 9.6 oz (72.848 kg)  BMI 27.83 kg/m2  SpO2 97%  LMP 09/13/1975 (Approximate)   Subjective:    Patient ID: Amy Rocha, female    DOB: 03/27/1955, 59 y.o.   MRN: [id number]  HPI: Amy Rocha is a 59 y.o. female  Chief Complaint  Patient presents with  . Vaginal Itching    pt states she has had some vaginal itching for 3 to 4 months now  . Medication Problem    pt states she needs crestor changed to something insurance will cover and would like cymbalta increased   Hypertension Using medications without difficulty Average home BPs   No problems or lightheadedness No chest pain with exertion or shortness of breath No Edema   Hyperlipidemia Needs Crestor changed Using medications without problems No Muscle aches  Diet compliance: variable.   Exercise: none  Depression Wants Cymbalta increased.  States she is "kind of" depressed.   Depression screen PHQ 2/9 03/29/2015 03/23/2015 02/22/2015 01/05/2015 11/07/2014  Decreased Interest 3 0 0 0 0  Down, Depressed, Hopeless 3 0 0 0 0  PHQ - 2 Score 6 0 0 0 0  Altered sleeping 2 - - - -  Tired, decreased energy 2 - - - -  Change in appetite 0 - - - -  Feeling bad or failure about yourself  0 - - - -  Trouble concentrating 0 - - - -  Suicidal thoughts 0 - - - -  PHQ-9 Score 10 - - - -   Vaginal itching States it is severe and skin is peeling.    Relevant past medical, surgical, family and social history reviewed and updated as indicated. Interim medical history since our last visit reviewed. Allergies and medications reviewed and updated.  Review of Systems  Per HPI unless specifically indicated above     Objective:    BP 154/94 mmHg  Pulse 87  Temp(Src) 97.9 F (36.6 C)  Ht 5' 3.7" (1.618 m)  Wt 160 lb 9.6 oz (72.848 kg)  BMI 27.83 kg/m2  SpO2 97%  LMP 09/13/1975 (Approximate)  Wt Readings from Last 3 Encounters:   03/29/15 160 lb 9.6 oz (72.848 kg)  03/23/15 155 lb (70.308 kg)  02/22/15 150 lb (68.04 kg)    Physical Exam  Constitutional: She is oriented to person, place, and time. She appears well-developed and well-nourished. No distress.  HENT:  Head: Normocephalic and atraumatic.  Eyes: Conjunctivae and lids are normal. Right eye exhibits no discharge. Left eye exhibits no discharge. No scleral icterus.  Neck: Normal range of motion. Neck supple. No JVD present. Carotid bruit is not present.  Cardiovascular: Normal rate, regular rhythm and normal heart sounds.   Pulmonary/Chest: Effort normal and breath sounds normal.  Abdominal: Normal appearance. There is no splenomegaly or hepatomegaly.  Genitourinary: There is erythema in the vagina. No vaginal discharge found.  Musculoskeletal: Normal range of motion.  Neurological: She is alert and oriented to person, place, and time.  Skin: Skin is warm, dry and intact. No rash noted. No pallor.  Psychiatric: She has a normal mood and affect. Her behavior is normal. Judgment and thought content normal.    Results for orders placed or performed in visit on 09/14/13  CBC  Result Value Ref Range   WBC 11.6 (H) 3.6-11.0 x10 3/mm 3   RBC 5.06 3.80-5.20 X10 6/mm   3   HGB 15.3 12.0-16.0 g/dL   HCT 47.7 (H) 35.0-47.0 %   MCV 94 80-100 fL   MCH 30.2 26.0-34.0 pg   MCHC 32.0 32.0-36.0 g/dL   RDW 13.3 11.5-14.5 %   Platelet 385 150-440 x10 3/mm 3  Comprehensive metabolic panel  Result Value Ref Range   Glucose 99 65-99 mg/dL   BUN 8 7-18 mg/dL   Creatinine 0.88 0.60-1.30 mg/dL   Sodium 142 136-145 mmol/L   Potassium 3.7 3.5-5.1 mmol/L   Chloride 108 (H) 98-107 mmol/L   Co2 24 21-32 mmol/L   Calcium, Total 8.8 8.5-10.1 mg/dL   SGOT(AST) 36 15-37 Unit/L   SGPT (ALT) 45 U/L   Alkaline Phosphatase 100 Unit/L   Albumin 3.9 3.4-5.0 g/dL   Total Protein 7.5 6.4-8.2 g/dL   Bilirubin,Total 0.4 0.2-1.0 mg/dL   Osmolality 281 275-301   Anion Gap 10 7-16    EGFR (African American) >60    EGFR (Non-African Amer.) >60   Ethanol  Result Value Ref Range   Ethanol < 3 mg/dL   Ethanol % < 0.003 0.000-0.080 %  Acetaminophen level  Result Value Ref Range   Acetaminophen < 2 mcg/mL  Salicylate level  Result Value Ref Range   Salicylates, Serum 2.3 mg/dL  Drug Screen, Urine  Result Value Ref Range   Tricyclic, Ur Screen NEGATIVE Cutoff-1000 ng/mL   Amphetamines, Ur Screen NEGATIVE Cutoff-1000 ng/mL   MDMA (Ecstasy)Ur Screen POSITIVE Cutoff-500 ng/mL   Cocaine Metabolite,Ur [place] NEGATIVE Cutoff-300 ng/mL   Opiate, Ur Screen NEGATIVE Cutoff-300 ng/mL   Phencyclidine (PCP) Ur S NEGATIVE Cutoff-25 ng/mL   Cannabinoid 50 Ng, Ur [place] NEGATIVE Cutoff-50 ng/mL   Barbiturates, Ur Screen NEGATIVE Cutoff-200 ng/mL   Methadone, Ur Screen NEGATIVE Cutoff-300 ng/mL   Benzodiazepine, Ur Scrn NEGATIVE Cutoff-200 ng/mL  Urinalysis, Complete  Result Value Ref Range   Color - urine Straw    Clarity - urine Clear    Glucose,UR Negative 0-75 mg/dL   Bilirubin,UR Negative NEGATIVE   Ketone Negative NEGATIVE   Specific Gravity 1.002 1.003-1.030   Blood Negative NEGATIVE   Ph 6.0 4.5-8.0   Protein Negative NEGATIVE   Nitrite Negative NEGATIVE   Leukocyte Esterase Negative NEGATIVE   RBC,UR <1 /HPF 0-5 /HPF   WBC UR 3 /HPF 0-5 /HPF   Bacteria NONE SEEN NONE SEEN   Squamous Epithelial NONE SEEN       Assessment & Plan:   Problem List Items Addressed This Visit      Unprioritized   Hypertensive heart failure ([place]) - Primary   Relevant Medications   atorvastatin (LIPITOR) 40 MG tablet   amLODipine (NORVASC) 5 MG tablet   Hyperlipidemia    Change Crestor to Atorvastatin      Relevant Medications   atorvastatin (LIPITOR) 40 MG tablet   amLODipine (NORVASC) 5 MG tablet   Other Relevant Orders   Lipid Panel w/o Chol/HDL Ratio   Depression    Increase Cymbalta to 60 mg      Relevant Medications   DULoxetine (CYMBALTA) 60 MG capsule   Hypertensive  CKD (chronic kidney disease)    Increase Amlodipine from 2.5 mg to 5 mg.  Check CMP.  Last GFR was good      Relevant Orders   Comprehensive metabolic panel    Other Visit Diagnoses    IFG (impaired fasting glucose)        Relevant Orders    Bayer DCA Hb A1c Waived    Vaginitis  Probably yeast.  check Hgb A1C and try Diflucan.  If no improvement try Metronidazole.      Nausea        Unknown cause.  Will see how increasing Cymbalta does        Follow up plan: Return in about 4 weeks (around 04/26/2015).

## 2015-03-29 NOTE — Assessment & Plan Note (Addendum)
Increase Amlodipine from 2.5 mg to 5 mg.  Check CMP.  Last GFR was good

## 2015-03-29 NOTE — Assessment & Plan Note (Signed)
Increase Cymbalta to 60 mg

## 2015-03-30 LAB — COMPREHENSIVE METABOLIC PANEL
A/G RATIO: 2.1 (ref 1.1–2.5)
ALBUMIN: 4.5 g/dL (ref 3.6–4.8)
ALT: 34 IU/L — ABNORMAL HIGH (ref 0–32)
AST: 30 IU/L (ref 0–40)
Alkaline Phosphatase: 94 IU/L (ref 39–117)
BILIRUBIN TOTAL: 0.2 mg/dL (ref 0.0–1.2)
BUN / CREAT RATIO: 9 — AB (ref 11–26)
BUN: 14 mg/dL (ref 8–27)
CHLORIDE: 100 mmol/L (ref 96–106)
CO2: 22 mmol/L (ref 18–29)
Calcium: 9.3 mg/dL (ref 8.7–10.3)
Creatinine, Ser: 1.51 mg/dL — ABNORMAL HIGH (ref 0.57–1.00)
GFR calc non Af Amer: 37 mL/min/{1.73_m2} — ABNORMAL LOW (ref >59)
GFR, EST AFRICAN AMERICAN: 43 mL/min/{1.73_m2} — AB (ref >59)
GLOBULIN, TOTAL: 2.1 g/dL (ref 1.5–4.5)
Glucose: 91 mg/dL (ref 65–99)
POTASSIUM: 3.9 mmol/L (ref 3.5–5.2)
Sodium: 143 mmol/L (ref 134–144)
TOTAL PROTEIN: 6.6 g/dL (ref 6.0–8.5)

## 2015-03-30 LAB — LIPID PANEL W/O CHOL/HDL RATIO
CHOLESTEROL TOTAL: 157 mg/dL (ref 100–199)
HDL: 95 mg/dL (ref >39)
LDL CALC: 35 mg/dL (ref 0–99)
TRIGLYCERIDES: 137 mg/dL (ref 0–149)
VLDL Cholesterol Cal: 27 mg/dL (ref 5–40)

## 2015-04-05 ENCOUNTER — Encounter: Payer: Self-pay | Admitting: Pain Medicine

## 2015-04-05 ENCOUNTER — Ambulatory Visit: Payer: Medicare Other | Attending: Pain Medicine | Admitting: Pain Medicine

## 2015-04-05 VITALS — BP 128/80 | HR 96 | Temp 98.1°F | Resp 1 | Ht 63.0 in | Wt 155.0 lb

## 2015-04-05 DIAGNOSIS — M533 Sacrococcygeal disorders, not elsewhere classified: Secondary | ICD-10-CM

## 2015-04-05 DIAGNOSIS — M47817 Spondylosis without myelopathy or radiculopathy, lumbosacral region: Secondary | ICD-10-CM | POA: Diagnosis not present

## 2015-04-05 DIAGNOSIS — Z96643 Presence of artificial hip joint, bilateral: Secondary | ICD-10-CM

## 2015-04-05 DIAGNOSIS — M4186 Other forms of scoliosis, lumbar region: Secondary | ICD-10-CM | POA: Diagnosis not present

## 2015-04-05 DIAGNOSIS — M5136 Other intervertebral disc degeneration, lumbar region: Secondary | ICD-10-CM | POA: Diagnosis not present

## 2015-04-05 DIAGNOSIS — M545 Low back pain: Secondary | ICD-10-CM | POA: Diagnosis present

## 2015-04-05 DIAGNOSIS — M87051 Idiopathic aseptic necrosis of right femur: Secondary | ICD-10-CM

## 2015-04-05 DIAGNOSIS — M87052 Idiopathic aseptic necrosis of left femur: Secondary | ICD-10-CM

## 2015-04-05 DIAGNOSIS — M791 Myalgia: Secondary | ICD-10-CM | POA: Diagnosis present

## 2015-04-05 DIAGNOSIS — M47816 Spondylosis without myelopathy or radiculopathy, lumbar region: Secondary | ICD-10-CM

## 2015-04-05 DIAGNOSIS — M79606 Pain in leg, unspecified: Secondary | ICD-10-CM | POA: Diagnosis present

## 2015-04-05 DIAGNOSIS — M5126 Other intervertebral disc displacement, lumbar region: Secondary | ICD-10-CM | POA: Diagnosis not present

## 2015-04-05 DIAGNOSIS — M51369 Other intervertebral disc degeneration, lumbar region without mention of lumbar back pain or lower extremity pain: Secondary | ICD-10-CM

## 2015-04-05 MED ORDER — BUPIVACAINE HCL (PF) 0.25 % IJ SOLN
INTRAMUSCULAR | Status: AC
  Administered 2015-04-05: 13:00:00
  Filled 2015-04-05: qty 30

## 2015-04-05 MED ORDER — MIDAZOLAM HCL 5 MG/5ML IJ SOLN: 5.0000 mg | Freq: Once | INTRAMUSCULAR | Status: DC

## 2015-04-05 MED ORDER — BUPIVACAINE HCL (PF) 0.25 % IJ SOLN: 30.0000 mL | Freq: Once | INTRAMUSCULAR | Status: DC

## 2015-04-05 MED ORDER — LIDOCAINE HCL (PF) 1 % IJ SOLN
INTRAMUSCULAR | Status: AC
  Filled 2015-04-05: qty 10

## 2015-04-05 MED ORDER — LIDOCAINE HCL (PF) 1 % IJ SOLN: 10.0000 mL | Freq: Once | INTRAMUSCULAR | Status: DC

## 2015-04-05 MED ORDER — ORPHENADRINE CITRATE 30 MG/ML IJ SOLN: 60.0000 mg | Freq: Once | INTRAMUSCULAR | Status: DC

## 2015-04-05 MED ORDER — ORPHENADRINE CITRATE 30 MG/ML IJ SOLN
INTRAMUSCULAR | Status: AC
  Administered 2015-04-05: 13:00:00
  Filled 2015-04-05: qty 2

## 2015-04-05 MED ORDER — FENTANYL CITRATE (PF) 100 MCG/2ML IJ SOLN
INTRAMUSCULAR | Status: AC
  Administered 2015-04-05: 50 ug via INTRAVENOUS
  Filled 2015-04-05: qty 2

## 2015-04-05 MED ORDER — TRIAMCINOLONE ACETONIDE 40 MG/ML IJ SUSP
INTRAMUSCULAR | Status: AC
  Administered 2015-04-05: 13:00:00
  Filled 2015-04-05: qty 1

## 2015-04-05 MED ORDER — CIPROFLOXACIN IN D5W 400 MG/200ML IV SOLN
INTRAVENOUS | Status: AC
  Administered 2015-04-05: 400 mg via INTRAVENOUS
  Filled 2015-04-05: qty 200

## 2015-04-05 MED ORDER — MIDAZOLAM HCL 5 MG/5ML IJ SOLN
INTRAMUSCULAR | Status: AC
  Administered 2015-04-05: 13:00:00 via INTRAVENOUS
  Filled 2015-04-05: qty 5

## 2015-04-05 MED ORDER — CIPROFLOXACIN HCL 250 MG PO TABS: 250.0000 mg | ORAL_TABLET | Freq: Two times a day (BID) | ORAL | Status: DC

## 2015-04-05 MED ORDER — LACTATED RINGERS IV SOLN: 1000.0000 mL | INTRAVENOUS | Status: DC

## 2015-04-05 MED ORDER — CIPROFLOXACIN IN D5W 400 MG/200ML IV SOLN: 400.0000 mg | Freq: Once | INTRAVENOUS | Status: DC

## 2015-04-05 MED ORDER — FENTANYL CITRATE (PF) 100 MCG/2ML IJ SOLN: 100.0000 ug | Freq: Once | INTRAMUSCULAR | Status: DC

## 2015-04-05 MED ORDER — TRIAMCINOLONE ACETONIDE 40 MG/ML IJ SUSP: 40.0000 mg | Freq: Once | INTRAMUSCULAR | Status: DC

## 2015-04-05 NOTE — Progress Notes (Signed)
Safety precautions to be maintained throughout the outpatient stay will include: orient to surroundings, keep bed in low position, maintain call bell within reach at all times, provide assistance with transfer out of bed and ambulation.  

## 2015-04-05 NOTE — Patient Instructions (Addendum)
PLAN   Continue present medication oxycodone acetaminophen and Opana extended release please obtain antibiotic Cipro antibiotic today and begin taking Cipro antibiotic today as prescribed  F/U PCP Dr. Jeananne Rama and Julian Hy for evaliation of  BP and general medical  conditio  F/U surgical evaluation. May consider pending follow-up evaluations  F/U neurological evaluation. May consider pending follow-up evaluations  May consider radiofrequency rhizolysis or intraspinal procedures pending response to present treatment and F/U evaluation  Patient is to call pain management should there be any concerns prior to scheduled return appointmentGENERAL RISKS AND COMPLICATIONS  What are the risk, side effects and possible complications? Generally speaking, most procedures are safe.  However, with any procedure there are risks, side effects, and the possibility of complications.  The risks and complications are dependent upon the sites that are lesioned, or the type of nerve block to be performed.  The closer the procedure is to the spine, the more serious the risks are.  Great care is taken when placing the radio frequency needles, block needles or lesioning probes, but sometimes complications can occur. 1. Infection: Any time there is an injection through the skin, there is a risk of infection.  This is why sterile conditions are used for these blocks.  There are four possible types of infection. 1. Localized skin infection. 2. Central Nervous System Infection-This can be in the form of Meningitis, which can be deadly. 3. Epidural Infections-This can be in the form of an epidural abscess, which can cause pressure inside of the spine, causing compression of the spinal cord with subsequent paralysis. This would require an emergency surgery to decompress, and there are no guarantees that the patient would recover from the paralysis. 4. Discitis-This is an infection of the intervertebral discs.  It occurs in  about 1% of discography procedures.  It is difficult to treat and it may lead to surgery.        2. Pain: the needles have to go through skin and soft tissues, will cause soreness.       3. Damage to internal structures:  The nerves to be lesioned may be near blood vessels or    other nerves which can be potentially damaged.       4. Bleeding: Bleeding is more common if the patient is taking blood thinners such as  aspirin, Coumadin, Ticiid, Plavix, etc., or if he/she have some genetic predisposition  such as hemophilia. Bleeding into the spinal canal can cause compression of the spinal  cord with subsequent paralysis.  This would require an emergency surgery to  decompress and there are no guarantees that the patient would recover from the  paralysis.       5. Pneumothorax:  Puncturing of a lung is a possibility, every time a needle is introduced in  the area of the chest or upper back.  Pneumothorax refers to free air around the  collapsed lung(s), inside of the thoracic cavity (chest cavity).  Another two possible  complications related to a similar event would include: Hemothorax and Chylothorax.   These are variations of the Pneumothorax, where instead of air around the collapsed  lung(s), you may have blood or chyle, respectively.       6. Spinal headaches: They may occur with any procedures in the area of the spine.       7. Persistent CSF (Cerebro-Spinal Fluid) leakage: This is a rare problem, but may occur  with prolonged intrathecal or epidural catheters either due to the formation of  a fistulous  track or a dural tear.       8. Nerve damage: By working so close to the spinal cord, there is always a possibility of  nerve damage, which could be as serious as a permanent spinal cord injury with  paralysis.       9. Death:  Although rare, severe deadly allergic reactions known as "Anaphylactic  reaction" can occur to any of the medications used.      10. Worsening of the symptoms:  We can always  make thing worse.  What are the chances of something like this happening? Chances of any of this occuring are extremely low.  By statistics, you have more of a chance of getting killed in a motor vehicle accident: while driving to the hospital than any of the above occurring .  Nevertheless, you should be aware that they are possibilities.  In general, it is similar to taking a shower.  Everybody knows that you can slip, hit your head and get killed.  Does that mean that you should not shower again?  Nevertheless always keep in mind that statistics do not mean anything if you happen to be on the wrong side of them.  Even if a procedure has a 1 (one) in a 1,000,000 (million) chance of going wrong, it you happen to be that one..Also, keep in mind that by statistics, you have more of a chance of having something go wrong when taking medications.  Who should not have this procedure? If you are on a blood thinning medication (e.g. Coumadin, Plavix, see list of "Blood Thinners"), or if you have an active infection going on, you should not have the procedure.  If you are taking any blood thinners, please inform your physician.  How should I prepare for this procedure?  Do not eat or drink anything at least six hours prior to the procedure.  Bring a driver with you .  It cannot be a taxi.  Come accompanied by an adult that can drive you back, and that is strong enough to help you if your legs get weak or numb from the local anesthetic.  Take all of your medicines the morning of the procedure with just enough water to swallow them.  If you have diabetes, make sure that you are scheduled to have your procedure done first thing in the morning, whenever possible.  If you have diabetes, take only half of your insulin dose and notify our nurse that you have done so as soon as you arrive at the clinic.  If you are diabetic, but only take blood sugar pills (oral hypoglycemic), then do not take them on the  morning of your procedure.  You may take them after you have had the procedure.  Do not take aspirin or any aspirin-containing medications, at least eleven (11) days prior to the procedure.  They may prolong bleeding.  Wear loose fitting clothing that may be easy to take off and that you would not mind if it got stained with Betadine or blood.  Do not wear any jewelry or perfume  Remove any nail coloring.  It will interfere with some of our monitoring equipment.  NOTE: Remember that this is not meant to be interpreted as a complete list of all possible complications.  Unforeseen problems may occur.  BLOOD THINNERS The following drugs contain aspirin or other products, which can cause increased bleeding during surgery and should not be taken for 2 weeks prior to and 1  week after surgery.  If you should need take something for relief of minor pain, you may take acetaminophen which is found in Tylenol,m Datril, Anacin-3 and Panadol. It is not blood thinner. The products listed below are.  Do not take any of the products listed below in addition to any listed on your instruction sheet.  A.P.C or A.P.C with Codeine Codeine Phosphate Capsules #3 Ibuprofen Ridaura  ABC compound Congesprin Imuran rimadil  Advil Cope Indocin Robaxisal  Alka-Seltzer Effervescent Pain Reliever and Antacid Coricidin or Coricidin-D  Indomethacin Rufen  Alka-Seltzer plus Cold Medicine Cosprin Ketoprofen S-A-C Tablets  Anacin Analgesic Tablets or Capsules Coumadin Korlgesic Salflex  Anacin Extra Strength Analgesic tablets or capsules CP-2 Tablets Lanoril Salicylate  Anaprox Cuprimine Capsules Levenox Salocol  Anexsia-D Dalteparin Magan Salsalate  Anodynos Darvon compound Magnesium Salicylate Sine-off  Ansaid Dasin Capsules Magsal Sodium Salicylate  Anturane Depen Capsules Marnal Soma  APF Arthritis pain formula Dewitt's Pills Measurin Stanback  Argesic Dia-Gesic Meclofenamic Sulfinpyrazone  Arthritis Bayer Timed  Release Aspirin Diclofenac Meclomen Sulindac  Arthritis pain formula Anacin Dicumarol Medipren Supac  Analgesic (Safety coated) Arthralgen Diffunasal Mefanamic Suprofen  Arthritis Strength Bufferin Dihydrocodeine Mepro Compound Suprol  Arthropan liquid Dopirydamole Methcarbomol with Aspirin Synalgos  ASA tablets/Enseals Disalcid Micrainin Tagament  Ascriptin Doan's Midol Talwin  Ascriptin A/D Dolene Mobidin Tanderil  Ascriptin Extra Strength Dolobid Moblgesic Ticlid  Ascriptin with Codeine Doloprin or Doloprin with Codeine Momentum Tolectin  Asperbuf Duoprin Mono-gesic Trendar  Aspergum Duradyne Motrin or Motrin IB Triminicin  Aspirin plain, buffered or enteric coated Durasal Myochrisine Trigesic  Aspirin Suppositories Easprin Nalfon Trillsate  Aspirin with Codeine Ecotrin Regular or Extra Strength Naprosyn Uracel  Atromid-S Efficin Naproxen Ursinus  Auranofin Capsules Elmiron Neocylate Vanquish  Axotal Emagrin Norgesic Verin  Azathioprine Empirin or Empirin with Codeine Normiflo Vitamin E  Azolid Emprazil Nuprin Voltaren  Bayer Aspirin plain, buffered or children's or timed BC Tablets or powders Encaprin Orgaran Warfarin Sodium  Buff-a-Comp Enoxaparin Orudis Zorpin  Buff-a-Comp with Codeine Equegesic Os-Cal-Gesic   Buffaprin Excedrin plain, buffered or Extra Strength Oxalid   Bufferin Arthritis Strength Feldene Oxphenbutazone   Bufferin plain or Extra Strength Feldene Capsules Oxycodone with Aspirin   Bufferin with Codeine Fenoprofen Fenoprofen Pabalate or Pabalate-SF   Buffets II Flogesic Panagesic   Buffinol plain or Extra Strength Florinal or Florinal with Codeine Panwarfarin   Buf-Tabs Flurbiprofen Penicillamine   Butalbital Compound Four-way cold tablets Penicillin   Butazolidin Fragmin Pepto-Bismol   Carbenicillin Geminisyn Percodan   Carna Arthritis Reliever Geopen Persantine   Carprofen Gold's salt Persistin   Chloramphenicol Goody's Phenylbutazone   Chloromycetin  [place] Piroxlcam   Clmetidine heparin Plaquenil   Cllnoril Hyco-pap Ponstel   Clofibrate Hydroxy chloroquine Propoxyphen         Before stopping any of these medications, be sure to consult the physician who ordered them.  Some, such as Coumadin (Warfarin) are ordered to prevent or treat serious conditions such as "deep thrombosis", "pumonary embolisms", and other heart problems.  The amount of time that you may need off of the medication may also vary with the medication and the reason for which you were taking it.  If you are taking any of these medications, please make sure you notify your pain physician before you undergo any procedures.

## 2015-04-05 NOTE — Progress Notes (Signed)
Subjective:    Patient ID: Amy Rocha, female    DOB: [birth year], 60 y.o.   MRN: [id number]  HPI PROCEDURE:  Block of nerves to the sacroiliac joint.   NOTE:  The patient is a 60 y.o. female who [facility] the [facility] for further evaluation and treatment of pain involving the lower back and lower extremity region with pain in the region of the buttocks as well. Prior MRI studies reveal degenerative disc disease lumbar spine Convex left scoliosis. Degenerative disc disease with facet hypertrophy at multiple levels involving L4-5 and L5-S1 predominantly. Mild disc bulging at L1-2 L4-5..  the patient is with reproduction of severe pain with palpation over the PSIS and PII S regions. There is concern regarding a significant component of the patient's pain being due to sacroiliac joint dysfunction The risks, benefits, expectations of the procedure have been discussed and explained to the patient who is understanding and willing to proceed with interventional treatment in attempt to decrease severity of patient's symptoms, minimize the risk of medication escalation and  hopefully retard the progression of the patient's symptoms. We will proceed with what is felt to be a medically necessary procedure, block of nerves to the sacroiliac joint.   DESCRIPTION OF PROCEDURE:  Block of nerves to the sacroiliac joint.   The patient was taken to the fluoroscopy suite. With the patient in the prone position with EKG, blood pressure, pulse and pulse oximetry monitoring, IV Versed, IV fentanyl conscious sedation, Betadine prep of proposed entry site was performed.   Block of nerves at the L5 vertebral body level.   With the patient in prone position, under fluoroscopic guidance, a 22 -gauge needle was inserted at the L5 vertebral body level on the left side. With 15 degrees oblique orientation a 22 -gauge needle was inserted in the region known as Burton's eye or eye of the Scotty dog. Following  documentation of needle placement in the area of Burton's eye or eye of the Scotty dog under fluoroscopic guidance, needle placement was then accomplished at the sacral ala level on the left side.   Needle placement at the sacral ala.   With the patient in prone position under fluoroscopic guidance with AP view of the lumbosacral spine, a 22 -gauge needle was inserted in the region known as the sacral ala on the left side. Following documentation of needle placement on the left side under fluoroscopic guidance needle placement was then accomplished at the S1 foramen level.   Needle placement at the S1 foramen level.   With the patient in prone position under fluoroscopic guidance with AP view of the lumbosacral spine and cephalad orientation, a 22 -gauge needle was inserted at the superior and lateral border of the S1 foramen on the left side. Following documentation of needle placement at the S1 foramen level on the left side, needle placement was then accomplished at the S2 foramen level on the left side.   Needle placement at the S2 foramen level.   With the patient in prone position with AP view of the lumbosacral spine with cephalad orientation, a 22 - gauge needle was inserted at the superior and lateral border of the S2 foramen under fluoroscopic guidance on the left side. Following needle placement at the L5 vertebral body level, sacral ala, S1 foramen and S2 foramen on the left side, needle placement was verified on lateral view under fluoroscopic guidance.  Following needle placement documentation on lateral view, each needle was injected with  1 mL of 0.25% bupivacaine and Kenalog.   BLOCK OF THE NERVES TO SACROILIAC JOINT ON THE RIGHT SIDE The procedure was performed on the right side at the same levels as was performed on the left side and utilizing the same technique as on the left side and was performed under fluoroscopic guidance as on the left side   A total of 10mg  of Kenalog was  utilized for the procedure.   PLAN:  1. Medications: The patient will continue presently prescribed medications oxycodone acetaminophen and Opana extended release.  2. The patient will be considered for modification of treatment regimen pending response to the procedure performed on today's visit.  3. The patient is to follow-up with primary care physician C Wicker for evaluation of blood pressure and general medical condition following the procedure performed on today's visit.  4. Surgical evaluation as discussed.  5. Neurological evaluation as discussed.  6. The patient may be a candidate for radiofrequency procedures, implantation devices and other treatment pending response to treatment performed on today's visit and follow-up evaluation.  7. The patient has been advised to adhere to proper body mechanics and to avoid activities which may exacerbate the patient's symptoms.   Return appointment to [facility] as scheduled.     Review of Systems     Objective:   Physical Exam        Assessment & Plan:

## 2015-04-06 ENCOUNTER — Telehealth: Payer: Self-pay | Admitting: *Deleted

## 2015-04-06 NOTE — Telephone Encounter (Signed)
Patient verbalizes no complications from procedure on yesterday.

## 2015-04-19 ENCOUNTER — Ambulatory Visit: Payer: Medicare Other | Attending: Pain Medicine | Admitting: Pain Medicine

## 2015-04-19 ENCOUNTER — Encounter: Payer: Self-pay | Admitting: Pain Medicine

## 2015-04-19 VITALS — BP 132/86 | HR 116 | Temp 98.5°F | Resp 18 | Ht 63.0 in | Wt 155.0 lb

## 2015-04-19 DIAGNOSIS — M87051 Idiopathic aseptic necrosis of right femur: Secondary | ICD-10-CM

## 2015-04-19 DIAGNOSIS — M791 Myalgia: Secondary | ICD-10-CM | POA: Diagnosis not present

## 2015-04-19 DIAGNOSIS — M47817 Spondylosis without myelopathy or radiculopathy, lumbosacral region: Secondary | ICD-10-CM | POA: Diagnosis not present

## 2015-04-19 DIAGNOSIS — Z8781 Personal history of (healed) traumatic fracture: Secondary | ICD-10-CM | POA: Insufficient documentation

## 2015-04-19 DIAGNOSIS — M533 Sacrococcygeal disorders, not elsewhere classified: Secondary | ICD-10-CM

## 2015-04-19 DIAGNOSIS — M51369 Other intervertebral disc degeneration, lumbar region without mention of lumbar back pain or lower extremity pain: Secondary | ICD-10-CM

## 2015-04-19 DIAGNOSIS — M4186 Other forms of scoliosis, lumbar region: Secondary | ICD-10-CM | POA: Insufficient documentation

## 2015-04-19 DIAGNOSIS — M5136 Other intervertebral disc degeneration, lumbar region: Secondary | ICD-10-CM | POA: Insufficient documentation

## 2015-04-19 DIAGNOSIS — M879 Osteonecrosis, unspecified: Secondary | ICD-10-CM | POA: Diagnosis not present

## 2015-04-19 DIAGNOSIS — Z96643 Presence of artificial hip joint, bilateral: Secondary | ICD-10-CM | POA: Diagnosis not present

## 2015-04-19 DIAGNOSIS — M5126 Other intervertebral disc displacement, lumbar region: Secondary | ICD-10-CM | POA: Diagnosis not present

## 2015-04-19 DIAGNOSIS — Z9689 Presence of other specified functional implants: Secondary | ICD-10-CM | POA: Insufficient documentation

## 2015-04-19 DIAGNOSIS — M47816 Spondylosis without myelopathy or radiculopathy, lumbar region: Secondary | ICD-10-CM

## 2015-04-19 DIAGNOSIS — G5600 Carpal tunnel syndrome, unspecified upper limb: Secondary | ICD-10-CM | POA: Insufficient documentation

## 2015-04-19 DIAGNOSIS — M545 Low back pain: Secondary | ICD-10-CM | POA: Diagnosis present

## 2015-04-19 DIAGNOSIS — M79604 Pain in right leg: Secondary | ICD-10-CM | POA: Diagnosis present

## 2015-04-19 DIAGNOSIS — M5416 Radiculopathy, lumbar region: Secondary | ICD-10-CM | POA: Diagnosis not present

## 2015-04-19 DIAGNOSIS — M87052 Idiopathic aseptic necrosis of left femur: Secondary | ICD-10-CM

## 2015-04-19 DIAGNOSIS — M79605 Pain in left leg: Secondary | ICD-10-CM | POA: Diagnosis present

## 2015-04-19 MED ORDER — OXYCODONE-ACETAMINOPHEN 10-325 MG PO TABS: ORAL_TABLET | ORAL | Status: DC

## 2015-04-19 MED ORDER — OXYMORPHONE HCL ER 20 MG PO T12A: EXTENDED_RELEASE_TABLET | ORAL | Status: DC

## 2015-04-19 NOTE — Patient Instructions (Addendum)
PLAN   Continue present medications Cymbalta Voltaren gel oxycodone acetaminophen oxymorphone extended release  .  Lumbar facet, medial branch nerve, blocks to be performed at time of return appointment  F/U PCP C Wicker for evaliation of  BP and general medical  condition as discussed.   F/U surgical evaluation. May consider pending follow-up evaluations  F/U neurological evaluation. May consider pending follow-up evaluations  May consider radiofrequency rhizolysis or intraspinal procedures pending response to present treatment and F/U evaluation   Patient to call [facility] should patient have concerns prior to scheduled return appointmenPtain Management Discharge Instructions  General Discharge Instructions :  If you need to reach your doctor call: Monday-Friday 8:00 am - 4:00 pm at [phone] or toll free [phone].  After clinic hours [phone] to have operator reach doctor.  Bring all of your medication bottles to all your appointments in the pain clinic.  To cancel or reschedule your appointment with Pain Management please remember to call 24 hours in advance to avoid a fee.  Refer to the educational materials which you have been given on: General Risks, I had my Procedure. Discharge Instructions, Post Sedation.  Post Procedure Instructions:  The drugs you were given will stay in your system until tomorrow, so for the next 24 hours you should not drive, make any legal decisions or drink any alcoholic beverages.  You may eat anything you prefer, but it is better to start with liquids then soups and crackers, and gradually work up to solid foods.  Please notify your doctor immediately if you have any unusual bleeding, trouble breathing or pain that is not related to your normal pain.  Depending on the type of procedure that was done, some parts of your body may feel week and/or numb.  This usually clears up by tonight or the next day.  Walk with the use  of an assistive device or accompanied by an adult for the 24 hours.  You may use ice on the affected area for the first 24 hours.  Put ice in a Ziploc bag and cover with a towel and place against area 15 minutes on 15 minutes off.  You may switch to heat after 24 hours.GENERAL RISKS AND COMPLICATIONS  What are the risk, side effects and possible complications? Generally speaking, most procedures are safe.  However, with any procedure there are risks, side effects, and the possibility of complications.  The risks and complications are dependent upon the sites that are lesioned, or the type of nerve block to be performed.  The closer the procedure is to the spine, the more serious the risks are.  Great care is taken when placing the radio frequency needles, block needles or lesioning probes, but sometimes complications can occur. 1. Infection: Any time there is an injection through the skin, there is a risk of infection.  This is why sterile conditions are used for these blocks.  There are four possible types of infection. 1. Localized skin infection. 2. Central Nervous System Infection-This can be in the form of Meningitis, which can be deadly. 3. Epidural Infections-This can be in the form of an epidural abscess, which can cause pressure inside of the spine, causing compression of the spinal cord with subsequent paralysis. This would require an emergency surgery to decompress, and there are no guarantees that the patient would recover from the paralysis. 4. Discitis-This is an infection of the intervertebral discs.  It occurs in about 1% of discography procedures.  It is difficult to  treat and it may lead to surgery.        2. Pain: the needles have to go through skin and soft tissues, will cause soreness.       3. Damage to internal structures:  The nerves to be lesioned may be near blood vessels or    other nerves which can be potentially damaged.       4. Bleeding: Bleeding is more common if the  patient is taking blood thinners such as  aspirin, Coumadin, Ticiid, Plavix, etc., or if he/she have some genetic predisposition  such as hemophilia. Bleeding into the spinal canal can cause compression of the spinal  cord with subsequent paralysis.  This would require an emergency surgery to  decompress and there are no guarantees that the patient would recover from the  paralysis.       5. Pneumothorax:  Puncturing of a lung is a possibility, every time a needle is introduced in  the area of the chest or upper back.  Pneumothorax refers to free air around the  collapsed lung(s), inside of the thoracic cavity (chest cavity).  Another two possible  complications related to a similar event would include: Hemothorax and Chylothorax.   These are variations of the Pneumothorax, where instead of air around the collapsed  lung(s), you may have blood or chyle, respectively.       6. Spinal headaches: They may occur with any procedures in the area of the spine.       7. Persistent CSF (Cerebro-Spinal Fluid) leakage: This is a rare problem, but may occur  with prolonged intrathecal or epidural catheters either due to the formation of a fistulous  track or a dural tear.       8. Nerve damage: By working so close to the spinal cord, there is always a possibility of  nerve damage, which could be as serious as a permanent spinal cord injury with  paralysis.       9. Death:  Although rare, severe deadly allergic reactions known as "Anaphylactic  reaction" can occur to any of the medications used.      10. Worsening of the symptoms:  We can always make thing worse.  What are the chances of something like this happening? Chances of any of this occuring are extremely low.  By statistics, you have more of a chance of getting killed in a motor vehicle accident: while driving to the hospital than any of the above occurring .  Nevertheless, you should be aware that they are possibilities.  In general, it is similar to taking a  shower.  Everybody knows that you can slip, hit your head and get killed.  Does that mean that you should not shower again?  Nevertheless always keep in mind that statistics do not mean anything if you happen to be on the wrong side of them.  Even if a procedure has a 1 (one) in a 1,000,000 (million) chance of going wrong, it you happen to be that one..Also, keep in mind that by statistics, you have more of a chance of having something go wrong when taking medications.  Who should not have this procedure? If you are on a blood thinning medication (e.g. Coumadin, Plavix, see list of "Blood Thinners"), or if you have an active infection going on, you should not have the procedure.  If you are taking any blood thinners, please inform your physician.  How should I prepare for this procedure?  Do not eat or  drink anything at least six hours prior to the procedure.  Bring a driver with you .  It cannot be a taxi.  Come accompanied by an adult that can drive you back, and that is strong enough to help you if your legs get weak or numb from the local anesthetic.  Take all of your medicines the morning of the procedure with just enough water to swallow them.  If you have diabetes, make sure that you are scheduled to have your procedure done first thing in the morning, whenever possible.  If you have diabetes, take only half of your insulin dose and notify our nurse that you have done so as soon as you arrive at the clinic.  If you are diabetic, but only take blood sugar pills (oral hypoglycemic), then do not take them on the morning of your procedure.  You may take them after you have had the procedure.  Do not take aspirin or any aspirin-containing medications, at least eleven (11) days prior to the procedure.  They may prolong bleeding.  Wear loose fitting clothing that may be easy to take off and that you would not mind if it got stained with Betadine or blood.  Do not wear any jewelry or  perfume  Remove any nail coloring.  It will interfere with some of our monitoring equipment.  NOTE: Remember that this is not meant to be interpreted as a complete list of all possible complications.  Unforeseen problems may occur.  BLOOD THINNERS The following drugs contain aspirin or other products, which can cause increased bleeding during surgery and should not be taken for 2 weeks prior to and 1 week after surgery.  If you should need take something for relief of minor pain, you may take acetaminophen which is found in Tylenol,m Datril, Anacin-3 and Panadol. It is not blood thinner. The products listed below are.  Do not take any of the products listed below in addition to any listed on your instruction sheet.  A.P.C or A.P.C with Codeine Codeine Phosphate Capsules #3 Ibuprofen Ridaura  ABC compound Congesprin Imuran rimadil  Advil Cope Indocin Robaxisal  Alka-Seltzer Effervescent Pain Reliever and Antacid Coricidin or Coricidin-D  Indomethacin Rufen  Alka-Seltzer plus Cold Medicine Cosprin Ketoprofen S-A-C Tablets  Anacin Analgesic Tablets or Capsules Coumadin Korlgesic Salflex  Anacin Extra Strength Analgesic tablets or capsules CP-2 Tablets Lanoril Salicylate  Anaprox Cuprimine Capsules Levenox Salocol  Anexsia-D Dalteparin Magan Salsalate  Anodynos Darvon compound Magnesium Salicylate Sine-off  Ansaid Dasin Capsules Magsal Sodium Salicylate  Anturane Depen Capsules Marnal Soma  APF Arthritis pain formula Dewitt's Pills Measurin Stanback  Argesic Dia-Gesic Meclofenamic Sulfinpyrazone  Arthritis Bayer Timed Release Aspirin Diclofenac Meclomen Sulindac  Arthritis pain formula Anacin Dicumarol Medipren Supac  Analgesic (Safety coated) Arthralgen Diffunasal Mefanamic Suprofen  Arthritis Strength Bufferin Dihydrocodeine Mepro Compound Suprol  Arthropan liquid Dopirydamole Methcarbomol with Aspirin Synalgos  ASA tablets/Enseals Disalcid Micrainin Tagament  Ascriptin Doan's Midol  Talwin  Ascriptin A/D Dolene Mobidin Tanderil  Ascriptin Extra Strength Dolobid Moblgesic Ticlid  Ascriptin with Codeine Doloprin or Doloprin with Codeine Momentum Tolectin  Asperbuf Duoprin Mono-gesic Trendar  Aspergum Duradyne Motrin or Motrin IB Triminicin  Aspirin plain, buffered or enteric coated Durasal Myochrisine Trigesic  Aspirin Suppositories Easprin Nalfon Trillsate  Aspirin with Codeine Ecotrin Regular or Extra Strength Naprosyn Uracel  Atromid-S Efficin Naproxen Ursinus  Auranofin Capsules Elmiron Neocylate Vanquish  Axotal Emagrin Norgesic Verin  Azathioprine Empirin or Empirin with Codeine Normiflo Vitamin E  Azolid Emprazil Nuprin  Voltaren  Bayer Aspirin plain, buffered or children's or timed BC Tablets or powders Encaprin Orgaran Warfarin Sodium  Buff-a-Comp Enoxaparin Orudis Zorpin  Buff-a-Comp with Codeine Equegesic Os-Cal-Gesic   Buffaprin Excedrin plain, buffered or Extra Strength Oxalid   Bufferin Arthritis Strength Feldene Oxphenbutazone   Bufferin plain or Extra Strength Feldene Capsules Oxycodone with Aspirin   Bufferin with Codeine Fenoprofen Fenoprofen Pabalate or Pabalate-SF   Buffets II Flogesic Panagesic   Buffinol plain or Extra Strength Florinal or Florinal with Codeine Panwarfarin   Buf-Tabs Flurbiprofen Penicillamine   Butalbital Compound Four-way cold tablets Penicillin   Butazolidin Fragmin Pepto-Bismol   Carbenicillin Geminisyn Percodan   Carna Arthritis Reliever Geopen Persantine   Carprofen Gold's salt Persistin   Chloramphenicol Goody's Phenylbutazone   Chloromycetin Haltrain Piroxlcam   Clmetidine heparin Plaquenil   Cllnoril Hyco-pap Ponstel   Clofibrate Hydroxy chloroquine Propoxyphen         Before stopping any of these medications, be sure to consult the physician who ordered them.  Some, such as Coumadin (Warfarin) are ordered to prevent or treat serious conditions such as "deep thrombosis", "pumonary embolisms", and other heart  problems.  The amount of time that you may need off of the medication may also vary with the medication and the reason for which you were taking it.  If you are taking any of these medications, please make sure you notify your pain physician before you undergo any procedures.         Facet Joint Block The facet joints connect the bones of the spine (vertebrae). They make it possible for you to bend, twist, and make other movements with your spine. They also prevent you from overbending, overtwisting, and making other excessive movements.  A facet joint block is a procedure where a numbing medicine (anesthetic) is injected into a facet joint. Often, a type of anti-inflammatory medicine called a steroid is also injected. A facet joint block may be done for two reasons:  2. Diagnosis. A facet joint block may be done as a test to see whether neck or back pain is caused by a worn-down or infected facet joint. If the pain gets better after a facet joint block, it means the pain is probably coming from the facet joint. If the pain does not get better, it means the pain is probably not coming from the facet joint.  3. Therapy. A facet joint block may be done to relieve neck or back pain caused by a facet joint. A facet joint block is only done as a therapy if the pain does not improve with medicine, exercise programs, physical therapy, and other forms of pain management. LET [facility] CARE PROVIDER KNOW ABOUT:   Any allergies you have.   All medicines you are taking, including vitamins, herbs, eyedrops, and over-the-counter medicines and creams.   Previous problems you or members of your family have had with the use of anesthetics.   Any blood disorders you have had.   Other health problems you have. RISKS AND COMPLICATIONS Generally, having a facet joint block is safe. However, as with any procedure, complications can occur. Possible complications associated with having a facet joint block  include:   Bleeding.   Injury to a nerve near the injection site.   Pain at the injection site.   Weakness or numbness in areas controlled by nerves near the injection site.   Infection.   Temporary fluid retention.   Allergic reaction to anesthetics or medicines used during the procedure.  BEFORE THE PROCEDURE   Follow your health care provider's instructions if you are taking dietary supplements or medicines. You may need to stop taking them or reduce your dosage.   Do not take any new dietary supplements or medicines without asking your health care provider first.   Follow your health care provider's instructions about eating and drinking before the procedure. You may need to stop eating and drinking several hours before the procedure.   Arrange to have an adult drive you home after the procedure. PROCEDURE 12. You may need to remove your clothing and dress in an open-back gown so that your health care provider can access your spine.  13. The procedure will be done while you are lying on an X-ray table. Most of the time you will be asked to lie on your stomach, but you may be asked to lie in a different position if an injection will be made in your neck.  14. Special machines will be used to monitor your oxygen levels, heart rate, and blood pressure.  15. If an injection will be made in your neck, an intravenous (IV) tube will be inserted into one of your veins. Fluids and medicine will flow directly into your body through the IV tube.  16. The area over the facet joint where the injection will be made will be cleaned with an antiseptic soap. The surrounding skin will be covered with sterile drapes.  17. An anesthetic will be applied to your skin to make the injection area numb. You may feel a temporary stinging or burning sensation.  18. A video X-ray machine will be used to locate the joint. A contrast dye may be injected into the facet joint area to help with  locating the joint.  19. When the joint is located, an anesthetic medicine will be injected into the joint through the needle.  29. Your health care provider will ask you whether you feel pain relief. If you do feel relief, a steroid may be injected to provide pain relief for a longer period of time. If you do not feel relief or feel only partial relief, additional injections of an anesthetic may be made in other facet joints.  21. The needle will be removed, the skin will be cleansed, and bandages will be applied.  AFTER THE PROCEDURE   You will be observed for 15-30 minutes before being allowed to go home. Do not drive. Have an adult drive you or take a taxi or public transportation instead.   If you feel pain relief, the pain will return in several hours or days when the anesthetic wears off.   You may feel pain relief 2-14 days after the procedure. The amount of time this relief lasts varies from person to person.   It is normal to feel some tenderness over the injected area(s) for 2 days following the procedure.   If you have diabetes, you may have a temporary increase in blood sugar.   This information is not intended to replace advice given to you by your health care provider. Make sure you discuss any questions you have with your health care provider.   Document Released: 06/26/2006 Document Revised: 02/25/2014 Document Reviewed: 11/25/2011 Elsevier Interactive Patient Education [employer].

## 2015-04-19 NOTE — Progress Notes (Signed)
Safety precautions to be maintained throughout the outpatient stay will include: orient to surroundings, keep bed in low position, maintain call bell within reach at all times, provide assistance with transfer out of bed and ambulation.  

## 2015-04-19 NOTE — Progress Notes (Signed)
Subjective:    Patient ID: Amy Rocha, female    DOB: 1955-05-17, 60 y.o.   MRN: [id number]  HPI  The patient is a 60 year old female who returns to pain management for further evaluation and treatment of pain involving the mid lower back and lower extremity regions. The patient has been delighted regarding the amount of pain relief she has pain with treatment in pain management. The patient stated that she recently fell sustaining trauma to the back with facet significant increase of lower back and lower extremity pain. The patient states that the pain has persisted despite present medication regimen. We discussed patient's condition including additional studies and will proceed with lumbar facet, medial branch nerve, blocks at time return appointment in attempt to decrease severity of symptoms, minimize progression of symptoms, and avoid the need for more involved treatment. The patient was with understanding and agreement with suggested treatment plan. We discussed patient's present medication regimen consider modification of medications pending follow-up evaluation. We have also consider additional studies of the spine pending response to the present treatment regimen. The patient was understanding and agreed to suggested treatment plan. The patient stated the pain became quite severe with standing for any significant period of time and a twisting turning maneuvers were 8 excruciating in terms of the level of pain produced. We will proceed with interventional treatment at time return appointment. We will continue Opana extended release and oxycodone acetaminophen. All agreed to suggested treatment plan           Review of Systems     Objective:   Physical Exam   There was tenderness over the splenius capitis and a separate talus muscles regions of mild degree. There was mild tenderness of the cervical facet cervical paraspinal musculature region. Palpation of the acromioclavicular  and glenohumeral joint regions with mild to moderate discomfort. The patient appeared to be with bilaterally equal grip strength and Tinel and Phalen's maneuver were without increased pain of significant degree. Palpation over the thoracic facet thoracic paraspinal must reason was attends to palpation of the thoracic region with mild increased pain with palpation over the spinous processes of the mid and lower thoracic region palpation of the paraspinal musculature region of the thoracic region was attends to palpation with moderate muscle spasms of the mid and lower thoracic region noted as well. His over the lumbar paraspinal musculature region lumbar facet region was with moderate tends to palpation with lateral bending rotation extension and palpation of the lumbar facets reproducing moderately severe discomfort. Palpation of the PSIS and PII S region reproduces moderate discomfort. Straight leg raise was tolerates approximately 20 without increased pain with dorsiflexion noted palpation of the greater trochanteric region was with mild tenderness to palpation. EHL strength appeared to be decreased. No definite sensory deficit or dermatomal distribution detected. Palpation of the gluteal and piriformis musculature regions reproduce moderate discomfort. No definite sensory deficit or dermatomal distribution detected. There appeared to be negative clonus negative Homans. Lateral bending rotation extension and palpation of the lumbar facets reproduced dominant portion of patient's pain.     Assessment & Plan:    Degenerative disc disease lumbar spine Convex left scoliosis. Degenerative disc disease with facet hypertrophy at multiple levels involving L4-5 and L5-S1 predominantly. Mild disc bulging at L1-2 L4-5.  Lumbar facet syndrome  Sacroiliac joint dysfunction  Status post bilateral total hip replacement  Avascular necrosis of hips  Status post spinal cord stimulator placement complicated by  infection requiring explantation of spinal  cord stimulator and healing by secondary intention  Carpal tunnel syndrome ( status post fracture of ribs)     PLAN   Continue present medications Cymbalta Voltaren gel oxycodone acetaminophen oxymorphone extended release  .  Lumbar facet, medial branch nerve, blocks to be performed at time of return appointment  F/U PCP C Wicker for evaliation of  BP and general medical  condition as discussed.   F/U surgical evaluation. May consider pending follow-up evaluations  F/U neurological evaluation. May consider pending follow-up evaluations  May consider radiofrequency rhizolysis or intraspinal procedures pending response to present treatment and F/U evaluation   Patient to call Pain Management Center should patient have concerns prior to scheduled return appointmen

## 2015-04-26 ENCOUNTER — Encounter: Payer: Self-pay | Admitting: Pain Medicine

## 2015-04-26 ENCOUNTER — Ambulatory Visit: Payer: Medicare Other | Attending: Pain Medicine | Admitting: Pain Medicine

## 2015-04-26 VITALS — BP 126/77 | HR 92 | Temp 98.4°F | Resp 16 | Ht 63.0 in | Wt 155.0 lb

## 2015-04-26 DIAGNOSIS — M47816 Spondylosis without myelopathy or radiculopathy, lumbar region: Secondary | ICD-10-CM

## 2015-04-26 DIAGNOSIS — M5126 Other intervertebral disc displacement, lumbar region: Secondary | ICD-10-CM | POA: Insufficient documentation

## 2015-04-26 DIAGNOSIS — M79606 Pain in leg, unspecified: Secondary | ICD-10-CM | POA: Diagnosis present

## 2015-04-26 DIAGNOSIS — M5136 Other intervertebral disc degeneration, lumbar region: Secondary | ICD-10-CM | POA: Insufficient documentation

## 2015-04-26 DIAGNOSIS — M419 Scoliosis, unspecified: Secondary | ICD-10-CM | POA: Diagnosis not present

## 2015-04-26 DIAGNOSIS — M87052 Idiopathic aseptic necrosis of left femur: Secondary | ICD-10-CM

## 2015-04-26 DIAGNOSIS — M533 Sacrococcygeal disorders, not elsewhere classified: Secondary | ICD-10-CM

## 2015-04-26 DIAGNOSIS — M47817 Spondylosis without myelopathy or radiculopathy, lumbosacral region: Secondary | ICD-10-CM | POA: Diagnosis not present

## 2015-04-26 DIAGNOSIS — Z96643 Presence of artificial hip joint, bilateral: Secondary | ICD-10-CM

## 2015-04-26 DIAGNOSIS — M87051 Idiopathic aseptic necrosis of right femur: Secondary | ICD-10-CM

## 2015-04-26 DIAGNOSIS — M545 Low back pain: Secondary | ICD-10-CM | POA: Diagnosis present

## 2015-04-26 MED ORDER — ORPHENADRINE CITRATE 30 MG/ML IJ SOLN
60.0000 mg | Freq: Once | INTRAMUSCULAR | Status: AC
  Administered 2015-04-26: 60 mg via INTRAMUSCULAR

## 2015-04-26 MED ORDER — BUPIVACAINE HCL (PF) 0.25 % IJ SOLN
INTRAMUSCULAR | Status: AC
  Administered 2015-04-26: 30 mL
  Filled 2015-04-26: qty 30

## 2015-04-26 MED ORDER — CIPROFLOXACIN IN D5W 400 MG/200ML IV SOLN
INTRAVENOUS | Status: AC
  Administered 2015-04-26: 400 mg via INTRAVENOUS
  Filled 2015-04-26: qty 200

## 2015-04-26 MED ORDER — LACTATED RINGERS IV SOLN: 1000.0000 mL | INTRAVENOUS | Status: DC

## 2015-04-26 MED ORDER — MIDAZOLAM HCL 5 MG/5ML IJ SOLN
INTRAMUSCULAR | Status: AC
  Administered 2015-04-26: 4 mg via INTRAVENOUS
  Filled 2015-04-26: qty 5

## 2015-04-26 MED ORDER — BUPIVACAINE HCL (PF) 0.25 % IJ SOLN
30.0000 mL | Freq: Once | INTRAMUSCULAR | Status: AC
  Administered 2015-04-26: 30 mL

## 2015-04-26 MED ORDER — CIPROFLOXACIN IN D5W 400 MG/200ML IV SOLN
400.0000 mg | Freq: Once | INTRAVENOUS | Status: AC
  Administered 2015-04-26: 400 mg via INTRAVENOUS

## 2015-04-26 MED ORDER — TRIAMCINOLONE ACETONIDE 40 MG/ML IJ SUSP
40.0000 mg | Freq: Once | INTRAMUSCULAR | Status: AC
Start: 2015-04-26 — End: 2015-04-26
  Administered 2015-04-26: 40 mg

## 2015-04-26 MED ORDER — CIPROFLOXACIN HCL 250 MG PO TABS: 250.0000 mg | ORAL_TABLET | Freq: Two times a day (BID) | ORAL | Status: DC

## 2015-04-26 MED ORDER — FENTANYL CITRATE (PF) 100 MCG/2ML IJ SOLN
100.0000 ug | Freq: Once | INTRAMUSCULAR | Status: AC
  Administered 2015-04-26: 100 ug via INTRAVENOUS

## 2015-04-26 MED ORDER — TRIAMCINOLONE ACETONIDE 40 MG/ML IJ SUSP
INTRAMUSCULAR | Status: AC
  Administered 2015-04-26: 40 mg
  Filled 2015-04-26: qty 1

## 2015-04-26 MED ORDER — ORPHENADRINE CITRATE 30 MG/ML IJ SOLN
INTRAMUSCULAR | Status: AC
  Administered 2015-04-26: 60 mg via INTRAMUSCULAR
  Filled 2015-04-26: qty 2

## 2015-04-26 MED ORDER — FENTANYL CITRATE (PF) 100 MCG/2ML IJ SOLN
INTRAMUSCULAR | Status: AC
  Administered 2015-04-26: 100 ug via INTRAVENOUS
  Filled 2015-04-26: qty 2

## 2015-04-26 MED ORDER — MIDAZOLAM HCL 5 MG/5ML IJ SOLN
5.0000 mg | Freq: Once | INTRAMUSCULAR | Status: AC
  Administered 2015-04-26: 4 mg via INTRAVENOUS

## 2015-04-26 NOTE — Progress Notes (Signed)
Subjective:    Patient ID: Amy Rocha, female    DOB: Nov 08, 1955, 59 y.o.   MRN: [id number]  HPI  PROCEDURE PERFORMED: Lumbar facet (medial branch block)   NOTE: The patient is a 60 y.o. female who returns to [facility] for further evaluation and treatment of pain involving the lumbar and lower extremity region. MRI  revealed the patient to be with evidence of degenerative disc disease lumbar spine Convex left scoliosis. Degenerative disc disease with facet hypertrophy at multiple levels involving L4-5 and L5-S1 predominantly. Mild disc bulging at L1-2 L4-5.Marland Kitchen There is concern regarding significant component of patient's pain being due to lumbar facet syndrome. The risks, benefits, and expectations of the procedure have been discussed and explained to the patient who was understanding and in agreement with suggested treatment plan. We will proceed with interventional treatment as discussed and as explained to the patient who was understanding and wished to proceed with procedure as planned.   DESCRIPTION OF PROCEDURE: Lumbar facet (medial branch block) with IV Versed, IV fentanyl conscious sedation, EKG, blood pressure, pulse, and pulse oximetry monitoring. The procedure was performed with the patient in the prone position. Betadine prep of proposed entry site performed.   NEEDLE PLACEMENT AT: Left L 2 lumbar facet (medial branch block). Under fluoroscopic guidance with oblique orientation of 15 degrees, a 22-gauge needle was inserted at the L 2 vertebral body level with needle placed at the targeted area of Burton's Eye or Eye of the Scotty Dog with documentation of needle placement in the superior and lateral border of targeted area of Burton's Eye or Eye of the Scotty Dog with oblique orientation of 15 degrees. Following documentation of needle placement at the L 2 vertebral body level, needle placement was then accomplished at the L 3 vertebral body level.   NEEDLE PLACEMENT AT  L3, L4, and L5 VERTEBRAL BODY LEVELS ON THE LEFT SIDE The procedure was performed at the L3, L4, and L5 vertebral body levels exactly as was performed at the L 2 vertebral body level utilizing the same technique and under fluoroscopic guidance.  NEEDLE PLACEMENT AT THE SACRAL ALA with AP view of the lumbosacral spine. With the patient in the prone position, Betadine prep of proposed entry site accomplished, a 22 gauge needle was inserted in the region of the sacral ala (groove formed by the superior articulating process of S1 and the sacral wing). Following documentation of needle placement at the sacral ala,  needle placement was then accomplished at the S1 foramen level.     Needle placement was then verified at all levels on lateral view. Following documentation of needle placement at all levels on lateral view and following negative aspiration for heme and CSF, each level was injected with 1 mL of 0.25% bupivacaine with Kenalog.     LUMBAR FACET, MEDIAL BRANCH NERVE, BLOCKS PERFORMED ON THE RIGHT SIDE   The procedure was performed on the right side exactly as was performed on the left side at the same levels and utilizing the same technique under fluoroscopic guidance.     The patient tolerated the procedure well. A total of 40 mg of Kenalog was utilized for the procedure.   PLAN:  1. Medications: The patient will continue presently prescribed medications.Opana ER and oxycodone acetaminophen 2. May consider modification of treatment regimen at time of return appointment pending response to treatment rendered on today's visit. 3. The patient is to follow-up with primary care physician Katheren Puller for further evaluation  of blood pressure and general medical condition status post steroid injection performed on today's visit. 4. Surgical follow-up evaluation. 5. Neurological follow-up evaluation. 6. The patient may be candidate for radiofrequency procedures, implantation type procedures, and  other treatment pending response to treatment and follow-up evaluation. 7. The patient has been advised to call the Pain Management Center prior to scheduled return appointment should there be significant change in condition or should patient have other concerns regarding condition prior to scheduled return appointment.  The patient is understanding and in agreement with suggested treatment plan.   Review of Systems     Objective:   Physical Exam        Assessment & Plan:

## 2015-04-26 NOTE — Progress Notes (Signed)
Safety precautions to be maintained throughout the outpatient stay will include: orient to surroundings, keep bed in low position, maintain call bell within reach at all times, provide assistance with transfer out of bed and ambulation.  

## 2015-04-26 NOTE — Patient Instructions (Addendum)
PLAN   Continue present medication oxycodone acetaminophen and Opana extended release   Please obtain antibiotic Cipro antibiotic today and begin taking Cipro antibiotic today as prescribed  F/U PCP Dr. Jeananne Rama and Julian Hy for evaliation of  BP and general medical  conditio  F/U surgical evaluation. May consider pending follow-up evaluations  F/U neurological evaluation. May consider pending follow-up evaluations  May consider radiofrequency rhizolysis or intraspinal procedures pending response to present treatment and F/U evaluation  Patient is to call pain management should there be any concerns prior to scheduled return appointmentPain Management Discharge Instructions  General Discharge Instructions :  If you need to reach your doctor call: Monday-Friday 8:00 am - 4:00 pm at [phone] or toll free [phone].  After clinic hours [phone] to have operator reach doctor.  Bring all of your medication bottles to all your appointments in the pain clinic.  To cancel or reschedule your appointment with Pain Management please remember to call 24 hours in advance to avoid a fee.  Refer to the educational materials which you have been given on: General Risks, I had my Procedure. Discharge Instructions, Post Sedation.  Post Procedure Instructions:  The drugs you were given will stay in your system until tomorrow, so for the next 24 hours you should not drive, make any legal decisions or drink any alcoholic beverages.  You may eat anything you prefer, but it is better to start with liquids then soups and crackers, and gradually work up to solid foods.  Please notify your doctor immediately if you have any unusual bleeding, trouble breathing or pain that is not related to your normal pain.  Depending on the type of procedure that was done, some parts of your body may feel week and/or numb.  This usually clears up by tonight or the next day.  Walk with the use of an assistive  device or accompanied by an adult for the 24 hours.  You may use ice on the affected area for the first 24 hours.  Put ice in a Ziploc bag and cover with a towel and place against area 15 minutes on 15 minutes off.  You may switch to heat after 24 hours.

## 2015-04-27 ENCOUNTER — Telehealth: Payer: Self-pay | Admitting: *Deleted

## 2015-04-27 NOTE — Telephone Encounter (Signed)
Attempted to reach patient x 2,  No answer and no voicemail capability.

## 2015-04-28 ENCOUNTER — Ambulatory Visit: Payer: Medicare Other | Admitting: Unknown Physician Specialty

## 2015-05-02 ENCOUNTER — Other Ambulatory Visit: Payer: Self-pay | Admitting: Pain Medicine

## 2015-05-09 ENCOUNTER — Other Ambulatory Visit: Payer: Self-pay | Admitting: Pain Medicine

## 2015-05-09 DIAGNOSIS — M47816 Spondylosis without myelopathy or radiculopathy, lumbar region: Secondary | ICD-10-CM

## 2015-05-09 DIAGNOSIS — M5136 Other intervertebral disc degeneration, lumbar region: Secondary | ICD-10-CM

## 2015-05-09 DIAGNOSIS — M5481 Occipital neuralgia: Secondary | ICD-10-CM

## 2015-05-09 DIAGNOSIS — M87051 Idiopathic aseptic necrosis of right femur: Secondary | ICD-10-CM

## 2015-05-09 DIAGNOSIS — Z96643 Presence of artificial hip joint, bilateral: Secondary | ICD-10-CM

## 2015-05-09 DIAGNOSIS — M87052 Idiopathic aseptic necrosis of left femur: Secondary | ICD-10-CM

## 2015-05-09 DIAGNOSIS — M533 Sacrococcygeal disorders, not elsewhere classified: Secondary | ICD-10-CM

## 2015-05-10 ENCOUNTER — Encounter: Payer: Self-pay | Admitting: Pain Medicine

## 2015-05-10 ENCOUNTER — Ambulatory Visit: Payer: Medicare Other | Admitting: Unknown Physician Specialty

## 2015-05-10 ENCOUNTER — Ambulatory Visit: Payer: Medicare Other | Attending: Pain Medicine | Admitting: Pain Medicine

## 2015-05-10 VITALS — BP 123/84 | HR 100 | Temp 98.2°F | Resp 18 | Ht 63.0 in | Wt 155.0 lb

## 2015-05-10 DIAGNOSIS — M5481 Occipital neuralgia: Secondary | ICD-10-CM | POA: Diagnosis not present

## 2015-05-10 DIAGNOSIS — M542 Cervicalgia: Secondary | ICD-10-CM | POA: Diagnosis present

## 2015-05-10 DIAGNOSIS — R51 Headache: Secondary | ICD-10-CM | POA: Diagnosis not present

## 2015-05-10 DIAGNOSIS — M87051 Idiopathic aseptic necrosis of right femur: Secondary | ICD-10-CM

## 2015-05-10 DIAGNOSIS — M5030 Other cervical disc degeneration, unspecified cervical region: Secondary | ICD-10-CM | POA: Diagnosis not present

## 2015-05-10 DIAGNOSIS — Z96643 Presence of artificial hip joint, bilateral: Secondary | ICD-10-CM

## 2015-05-10 DIAGNOSIS — M87052 Idiopathic aseptic necrosis of left femur: Secondary | ICD-10-CM

## 2015-05-10 DIAGNOSIS — M533 Sacrococcygeal disorders, not elsewhere classified: Secondary | ICD-10-CM

## 2015-05-10 DIAGNOSIS — M5136 Other intervertebral disc degeneration, lumbar region: Secondary | ICD-10-CM

## 2015-05-10 DIAGNOSIS — M47816 Spondylosis without myelopathy or radiculopathy, lumbar region: Secondary | ICD-10-CM

## 2015-05-10 MED ORDER — ORPHENADRINE CITRATE 30 MG/ML IJ SOLN: 60.0000 mg | Freq: Once | INTRAMUSCULAR | Status: DC

## 2015-05-10 MED ORDER — BUPIVACAINE HCL (PF) 0.25 % IJ SOLN: 30.0000 mL | Freq: Once | INTRAMUSCULAR | Status: DC

## 2015-05-10 MED ORDER — TRIAMCINOLONE ACETONIDE 40 MG/ML IJ SUSP
INTRAMUSCULAR | Status: AC
  Administered 2015-05-10: 09:00:00
  Filled 2015-05-10: qty 1

## 2015-05-10 MED ORDER — MIDAZOLAM HCL 5 MG/5ML IJ SOLN
5.0000 mg | Freq: Once | INTRAMUSCULAR | Status: DC
Start: 2015-05-10 — End: 2015-12-01

## 2015-05-10 MED ORDER — TRIAMCINOLONE ACETONIDE 40 MG/ML IJ SUSP
40.0000 mg | Freq: Once | INTRAMUSCULAR | Status: DC
Start: 2015-05-10 — End: 2015-12-01

## 2015-05-10 MED ORDER — MIDAZOLAM HCL 5 MG/5ML IJ SOLN
INTRAMUSCULAR | Status: AC
  Administered 2015-05-10: 4 mg via INTRAVENOUS
  Filled 2015-05-10: qty 5

## 2015-05-10 MED ORDER — FENTANYL CITRATE (PF) 100 MCG/2ML IJ SOLN
INTRAMUSCULAR | Status: AC
  Administered 2015-05-10: 100 ug via INTRAVENOUS
  Filled 2015-05-10: qty 2

## 2015-05-10 MED ORDER — BUPIVACAINE HCL (PF) 0.25 % IJ SOLN
INTRAMUSCULAR | Status: AC
  Administered 2015-05-10: 09:00:00
  Filled 2015-05-10: qty 30

## 2015-05-10 MED ORDER — FENTANYL CITRATE (PF) 100 MCG/2ML IJ SOLN: 100.0000 ug | Freq: Once | INTRAMUSCULAR | Status: DC

## 2015-05-10 MED ORDER — ORPHENADRINE CITRATE 30 MG/ML IJ SOLN
INTRAMUSCULAR | Status: AC
  Administered 2015-05-10: 09:00:00
  Filled 2015-05-10: qty 2

## 2015-05-10 MED ORDER — LACTATED RINGERS IV SOLN: 1000.0000 mL | INTRAVENOUS | Status: DC

## 2015-05-10 NOTE — Progress Notes (Signed)
Safety precautions to be maintained throughout the outpatient stay will include: orient to surroundings, keep bed in low position, maintain call bell within reach at all times, provide assistance with transfer out of bed and ambulation.  

## 2015-05-10 NOTE — Progress Notes (Signed)
   Subjective:    Patient ID: Amy Rocha, female    DOB: 22-Aug-1955, 60 y.o.   MRN: [id number]  HPI  NOTE: The patient is a 60 y.o.-year-old female who returns to the Pain Management Center for further evaluation and treatment of pain consisting of pain involving the region of the neck and headache.  Patient is with prior studies revealing patient to be with degenerative changes of the cervical spine. . the patient is with headaches which radiate from the cervical region and continues to the occipital region and progresses forward to the retro-orbital region. There is concern regarding significant component of patient's headache being due to bilateral occipital neuralgia   The risks, benefits, and expectations of the procedure have been discussed and explained to patient, who is understanding and wishes to proceed with interventional treatment as discussed and as explained to patient.  Will proceed with greater occipital nerve blocks with myoneural block injections at this time as discussed and as explained to patient.  All are understanding and in agreement with suggested treatment plan.    PROCEDURE:  Greater occipital nerve block on the left side with IV Versed, IV Fentanyl, conscious sedation, EKG, blood pressure, pulse, pulse oximetry monitoring.  Procedure performed with patient in prone position.  Greater occipital nerve block on the left side.   With patient in prone position, Betadine prep of proposed entry site accomplished.  Following identification of the nuchal ridge, 22 -gauge needle was inserted at the level of the nuchal ridge medial to the occipital artery.  Following negative aspiration, 4cc 0.25% bupivacaine with Kenalog injected for left greater occipital nerve block.  Needle was removed.  Patient tolerated injection well.   Greater occipital nerve block on the rightt side. The greater occipital nerve block on the right side was performed exactly as the left greater occipital  nerve block was performed and utilizing the same technique.   Myoneural block injections of the cervical paraspinal musculature region. Following Betadine prep of proposed entry site a 22-gauge needle was inserted in the cervical paraspinal musculature region and following negative aspiration 2 cc of 0.25% bupivacaine with Norflex was injected for myoneural block injection of the cervical region 4  The patient tolerated the procedure well   A total of 10 mg Kenalog was utilized for the entire procedure.  PLAN:    1. Medications: Will continue presently prescribed medications  Cymbalta oxymorphone extended release oxycodone and Voltaren gel at this time. 2. Patient to follow up with primary care physician  C Wicker for evaluation of blood pressure and general medical condition status post procedure performed on today's visit. 3. Neurological evaluation for further assessment of headaches for further studies as discussed. 4. Surgical evaluation as discussed.  5. Patient may be candidate for Botox injections, radiofrequency procedures, as well as implantation type procedures pending response to treatment rendered on today's visit and pending follow-up evaluation. 6. Patient has been advised to adhere to proper body mechanics and to avoid activities which appear to aggravate condition.cations:  Will continue presently prescribed medications at this time. 7. The patient is understanding and in agreement with the suggested treatment plan.   Review of Systems     Objective:   Physical Exam        Assessment & Plan:

## 2015-05-10 NOTE — Patient Instructions (Addendum)
PLAN   Continue present medications Cymbalta Voltaren gel oxycodone acetaminophen oxymorphone extended release  .  F/U PCP C Wicker for evaliation of  BP headache and general medical  condition as discussed.   F/U surgical evaluation. May consider pending follow-up evaluations  F/U neurological evaluation. May consider further evaluation of headache with neurological consultation pending response to procedure performed today and follow-up evaluations  May consider radiofrequency rhizolysis or intraspinal procedures pending response to present treatment and F/U evaluation   Patient to call Pain Management Center should patient have concerns prior to scheduled return appointmentPain Management Discharge Instructions  General Discharge Instructions :  If you need to reach your doctor call: Monday-Friday 8:00 am - 4:00 pm at [phone] or toll free [phone].  After clinic hours [phone] to have operator reach doctor.  Bring all of your medication bottles to all your appointments in the pain clinic.  To cancel or reschedule your appointment with Pain Management please remember to call 24 hours in advance to avoid a fee.  Refer to the educational materials which you have been given on: General Risks, I had my Procedure. Discharge Instructions, Post Sedation.  Post Procedure Instructions:  The drugs you were given will stay in your system until tomorrow, so for the next 24 hours you should not drive, make any legal decisions or drink any alcoholic beverages.  You may eat anything you prefer, but it is better to start with liquids then soups and crackers, and gradually work up to solid foods.  Please notify your doctor immediately if you have any unusual bleeding, trouble breathing or pain that is not related to your normal pain.  Depending on the type of procedure that was done, some parts of your body may feel week and/or numb.  This usually clears up by tonight or the next  day.  Walk with the use of an assistive device or accompanied by an adult for the 24 hours.  You may use ice on the affected area for the first 24 hours.  Put ice in a Ziploc bag and cover with a towel and place against area 15 minutes on 15 minutes off.  You may switch to heat after 24 hours.

## 2015-05-11 ENCOUNTER — Telehealth: Payer: Self-pay | Admitting: *Deleted

## 2015-05-11 NOTE — Telephone Encounter (Signed)
Attempted call to patient, no voicemail availability.

## 2015-05-11 NOTE — Telephone Encounter (Signed)
Patient denies any questions or concerns re; procedure on yesterday.

## 2015-05-18 ENCOUNTER — Telehealth: Payer: Self-pay

## 2015-05-18 ENCOUNTER — Encounter: Payer: Self-pay | Admitting: Pain Medicine

## 2015-05-18 ENCOUNTER — Telehealth: Payer: Self-pay | Admitting: *Deleted

## 2015-05-18 ENCOUNTER — Ambulatory Visit: Payer: Medicare Other | Attending: Pain Medicine | Admitting: Pain Medicine

## 2015-05-18 VITALS — BP 135/79 | HR 105 | Temp 98.0°F | Resp 18 | Ht 63.0 in | Wt 155.0 lb

## 2015-05-18 DIAGNOSIS — G5600 Carpal tunnel syndrome, unspecified upper limb: Secondary | ICD-10-CM | POA: Insufficient documentation

## 2015-05-18 DIAGNOSIS — Z96643 Presence of artificial hip joint, bilateral: Secondary | ICD-10-CM | POA: Insufficient documentation

## 2015-05-18 DIAGNOSIS — Z9689 Presence of other specified functional implants: Secondary | ICD-10-CM | POA: Diagnosis not present

## 2015-05-18 DIAGNOSIS — M5136 Other intervertebral disc degeneration, lumbar region: Secondary | ICD-10-CM | POA: Diagnosis not present

## 2015-05-18 DIAGNOSIS — M87051 Idiopathic aseptic necrosis of right femur: Secondary | ICD-10-CM

## 2015-05-18 DIAGNOSIS — M5126 Other intervertebral disc displacement, lumbar region: Secondary | ICD-10-CM | POA: Diagnosis not present

## 2015-05-18 DIAGNOSIS — M545 Low back pain: Secondary | ICD-10-CM | POA: Diagnosis present

## 2015-05-18 DIAGNOSIS — M546 Pain in thoracic spine: Secondary | ICD-10-CM | POA: Diagnosis present

## 2015-05-18 DIAGNOSIS — M419 Scoliosis, unspecified: Secondary | ICD-10-CM | POA: Diagnosis not present

## 2015-05-18 DIAGNOSIS — G90523 Complex regional pain syndrome I of lower limb, bilateral: Secondary | ICD-10-CM | POA: Insufficient documentation

## 2015-05-18 DIAGNOSIS — M879 Osteonecrosis, unspecified: Secondary | ICD-10-CM | POA: Insufficient documentation

## 2015-05-18 DIAGNOSIS — M542 Cervicalgia: Secondary | ICD-10-CM | POA: Diagnosis present

## 2015-05-18 DIAGNOSIS — M5481 Occipital neuralgia: Secondary | ICD-10-CM | POA: Diagnosis not present

## 2015-05-18 DIAGNOSIS — M461 Sacroiliitis, not elsewhere classified: Secondary | ICD-10-CM | POA: Diagnosis not present

## 2015-05-18 DIAGNOSIS — M47817 Spondylosis without myelopathy or radiculopathy, lumbosacral region: Secondary | ICD-10-CM | POA: Diagnosis not present

## 2015-05-18 DIAGNOSIS — M5416 Radiculopathy, lumbar region: Secondary | ICD-10-CM | POA: Diagnosis not present

## 2015-05-18 DIAGNOSIS — M533 Sacrococcygeal disorders, not elsewhere classified: Secondary | ICD-10-CM | POA: Diagnosis not present

## 2015-05-18 DIAGNOSIS — M87052 Idiopathic aseptic necrosis of left femur: Secondary | ICD-10-CM

## 2015-05-18 DIAGNOSIS — M47816 Spondylosis without myelopathy or radiculopathy, lumbar region: Secondary | ICD-10-CM

## 2015-05-18 MED ORDER — OXYCODONE-ACETAMINOPHEN 10-325 MG PO TABS: ORAL_TABLET | ORAL | Status: DC

## 2015-05-18 MED ORDER — OXYMORPHONE HCL ER 20 MG PO T12A: EXTENDED_RELEASE_TABLET | ORAL | Status: DC

## 2015-05-18 MED ORDER — GABAPENTIN 300 MG PO CAPS: ORAL_CAPSULE | ORAL | Status: DC

## 2015-05-18 NOTE — Telephone Encounter (Signed)
Patient called in re; UDS from September that reflected her taking Gabapentin.  Patient has been prescribed Gabapentin today at appointment with Dr Primus Bravo but denies ever having taken in the past.  Spoke with Dr Primus Bravo and assured patient that there were no worries regarding this finding.  Patient verbalizes u/o information.

## 2015-05-18 NOTE — Progress Notes (Signed)
Safety precautions to be maintained throughout the outpatient stay will include: orient to surroundings, keep bed in low position, maintain call bell within reach at all times, provide assistance with transfer out of bed and ambulation.  

## 2015-05-18 NOTE — Telephone Encounter (Signed)
Pt wants to discuss what showed up on her drug test

## 2015-05-18 NOTE — Progress Notes (Deleted)
premenstrual tension syndrome  

## 2015-05-18 NOTE — Patient Instructions (Addendum)
PLAN   Continue present medications Cymbalta Voltaren gel oxycodone acetaminophen oxymorphone extended release  . Resume Neurontin CAUTION Neurontin can cause excessive sedation confusion respiratory depression and other side effects. Exercise extreme caution when taking Neurontin as well as her other medications   F/U PCP C Wicker for evaliation of  BP and general medical  condition as discussed.. Discuss taking Neurontin with primary care physician  C Wicker before getting Neurontin and taking Neurontin  F/U surgical evaluation. May consider pending follow-up evaluations  Ask the nurses and secretary the date of your vascular evaluation as discussed for further evaluation of lower extremity pain  F/U neurological evaluation. May conside PNCV/EMG studies and other studies r pending follow-up evaluations  May consider radiofrequency rhizolysis or intraspinal procedures pending response to present treatment and F/U evaluation   Patient to call Pain Management Center should patient have concerns prior to scheduled return appointment

## 2015-05-18 NOTE — Progress Notes (Signed)
Subjective:    Patient ID: Amy Rocha, female    DOB: [birth year], 60 y.o.   MRN: [id number]  HPI  The patient is a 60 year old female who returns to pain management for further evaluation and treatment of pain involving the mid and lower back and lower extremity region with pain which involves the neck as well into a lesser degree. The patient is status post lumbar facet, medial branch nerve, proximal and has had significant improvement of her lower back and lower extremity pain. The patient does admit to pain involving the lower extremities which patient states has been present since she underwent total hip replacement for avascular necrosis of the hips. We discussed patient's condition and will proceed with scheduling patient for vascular evaluation. We will also prescribe Neurontin at this time and may consider patient for lumbar sympathetic block and other procedures and other treatment pending response to the present treatment regimen. The patient will continue Cymbalta Opana extended release with oxycodone acetaminophen for breakthrough pain and patient will begin Neurontin right primary care physician C Julian Hy is in agreement with suggested treatment plan begin Neurontin. All agreed to suggested treatment plan   Review of Systems     Objective:   Physical Exam  There was tends to palpation of the splenius capitis and occipitalis regions of mild degree with mild tenderness to palpation over the region of the cervical facet cervical paraspinal musculature region. Palpation over the thoracic facet thoracic paraspinal must reason was attends to palpation of moderate degree of the lower thoracic region with no crepitus of the thoracic region noted. Palpation of the acromioclavicular and glenohumeral joint regions reproduced pain of mild degree and patient appeared to be unremarkable Spurling's maneuver. The patient appeared to be with bilaterally equal grip strength and Tinel and Phalen's  maneuver were without increase of pain of significant degree. Palpation over the thoracic facet thoracic paraspinal musculature region was attends to palpation of moderate degree with lateral bending rotation extension and palpation of the lumbar facets reproducing moderate discomfort. There was mild to moderate tenderness of the PSIS and PII S region a mild tenderness of the greater trochanteric region and iliotibial band region. Straight leg raise was tolerates approximately 30 without increased pain with dorsiflexion noted. EHL strength appeared to be slightly decreased. The patient appeared to be performed At the pulses of the lower extremities with both the posterior tibial and dorsalis pedis pulses being palpable and appeared to be faint. There was negative clonus negative Homans. Abdomen nontender and no costovertebral tenderness noted.      Assessment & Plan:      Degenerative disc disease lumbar spine Convex left scoliosis. Degenerative disc disease with facet hypertrophy at multiple levels involving L4-5 and L5-S1 predominantly. Mild disc bulging at L1-2 L4-5.  Lumbar facet syndrome  Sacroiliac joint dysfunction  Status post bilateral total hip replacement  Avascular necrosis of hips  Status post spinal cord stimulator placement complicated by infection requiring explantation of spinal cord stimulator and healing by secondary intention  Carpal tunnel syndrome  Complex regional pain syndrome of lower extremities versus vascular compromise      PLAN   Continue present medications Cymbalta Voltaren gel oxycodone acetaminophen oxymorphone extended release  . Resume Neurontin CAUTION Neurontin can cause excessive sedation confusion respiratory depression and other side effects. Exercise extreme caution when taking Neurontin as well as her other medications   F/U PCP C Wicker for evaliation of  BP and general medical  condition as discussed.. Discuss  taking Neurontin with  primary care physician  C Wicker before getting Neurontin and taking Neurontin  F/U surgical evaluation. May consider pending follow-up evaluations  Ask the nurses and secretary the date of your vascular evaluation as discussed for further evaluation of lower extremity pain  F/U neurological evaluation. May conside PNCV/EMG studies and other studies r pending follow-up evaluations  May consider radiofrequency rhizolysis or intraspinal procedures pending response to present treatment and F/U evaluation   Patient to call Pain Management Center should patient have concerns prior to scheduled return appointment

## 2015-05-31 ENCOUNTER — Encounter: Payer: Self-pay | Admitting: *Deleted

## 2015-05-31 ENCOUNTER — Emergency Department
Admission: EM | Admit: 2015-05-31 | Discharge: 2015-05-31 | Disposition: A | Discharge status: Home / Self Care | Payer: Medicare Other | Attending: Emergency Medicine | Admitting: Emergency Medicine

## 2015-05-31 ENCOUNTER — Emergency Department: Payer: Medicare Other

## 2015-05-31 DIAGNOSIS — E785 Hyperlipidemia, unspecified: Secondary | ICD-10-CM | POA: Diagnosis not present

## 2015-05-31 DIAGNOSIS — Z87891 Personal history of nicotine dependence: Secondary | ICD-10-CM | POA: Diagnosis not present

## 2015-05-31 DIAGNOSIS — I509 Heart failure, unspecified: Secondary | ICD-10-CM | POA: Insufficient documentation

## 2015-05-31 DIAGNOSIS — Z7982 Long term (current) use of aspirin: Secondary | ICD-10-CM | POA: Insufficient documentation

## 2015-05-31 DIAGNOSIS — J449 Chronic obstructive pulmonary disease, unspecified: Secondary | ICD-10-CM | POA: Diagnosis not present

## 2015-05-31 DIAGNOSIS — Z96643 Presence of artificial hip joint, bilateral: Secondary | ICD-10-CM | POA: Insufficient documentation

## 2015-05-31 DIAGNOSIS — J45909 Unspecified asthma, uncomplicated: Secondary | ICD-10-CM | POA: Diagnosis not present

## 2015-05-31 DIAGNOSIS — R600 Localized edema: Secondary | ICD-10-CM | POA: Insufficient documentation

## 2015-05-31 DIAGNOSIS — Z888 Allergy status to other drugs, medicaments and biological substances status: Secondary | ICD-10-CM | POA: Diagnosis not present

## 2015-05-31 DIAGNOSIS — Z79899 Other long term (current) drug therapy: Secondary | ICD-10-CM | POA: Insufficient documentation

## 2015-05-31 DIAGNOSIS — M1990 Unspecified osteoarthritis, unspecified site: Secondary | ICD-10-CM | POA: Diagnosis not present

## 2015-05-31 DIAGNOSIS — R609 Edema, unspecified: Secondary | ICD-10-CM

## 2015-05-31 DIAGNOSIS — N184 Chronic kidney disease, stage 4 (severe): Secondary | ICD-10-CM | POA: Diagnosis not present

## 2015-05-31 DIAGNOSIS — I130 Hypertensive heart and chronic kidney disease with heart failure and stage 1 through stage 4 chronic kidney disease, or unspecified chronic kidney disease: Secondary | ICD-10-CM | POA: Diagnosis not present

## 2015-05-31 DIAGNOSIS — R0602 Shortness of breath: Secondary | ICD-10-CM | POA: Insufficient documentation

## 2015-05-31 DIAGNOSIS — R339 Retention of urine, unspecified: Secondary | ICD-10-CM | POA: Diagnosis present

## 2015-05-31 DIAGNOSIS — Z90710 Acquired absence of both cervix and uterus: Secondary | ICD-10-CM | POA: Insufficient documentation

## 2015-05-31 DIAGNOSIS — I2510 Atherosclerotic heart disease of native coronary artery without angina pectoris: Secondary | ICD-10-CM | POA: Diagnosis not present

## 2015-05-31 DIAGNOSIS — F418 Other specified anxiety disorders: Secondary | ICD-10-CM | POA: Insufficient documentation

## 2015-05-31 LAB — BASIC METABOLIC PANEL
ANION GAP: 6 (ref 5–15)
BUN: 11 mg/dL (ref 6–20)
CALCIUM: 8.8 mg/dL — AB (ref 8.9–10.3)
CHLORIDE: 106 mmol/L (ref 101–111)
CO2: 23 mmol/L (ref 22–32)
Creatinine, Ser: 0.95 mg/dL (ref 0.44–1.00)
GFR calc non Af Amer: >60 mL/min (ref >60)
Glucose, Bld: 111 mg/dL — ABNORMAL HIGH (ref 65–99)
Potassium: 3.8 mmol/L (ref 3.5–5.1)
Sodium: 135 mmol/L (ref 135–145)

## 2015-05-31 LAB — TROPONIN I: Troponin I: <0.03 ng/mL (ref <0.031)

## 2015-05-31 LAB — URINALYSIS COMPLETE WITH MICROSCOPIC (ARMC ONLY)
BILIRUBIN URINE: NEGATIVE
Glucose, UA: NEGATIVE mg/dL
HGB URINE DIPSTICK: NEGATIVE
KETONES UR: NEGATIVE mg/dL
Nitrite: NEGATIVE
PH: 5 (ref 5.0–8.0)
PROTEIN: NEGATIVE mg/dL
Specific Gravity, Urine: 1.006 (ref 1.005–1.030)

## 2015-05-31 LAB — CBC
HCT: 35.8 % (ref 35.0–47.0)
HEMOGLOBIN: 12.1 g/dL (ref 12.0–16.0)
MCH: 30.4 pg (ref 26.0–34.0)
MCHC: 33.8 g/dL (ref 32.0–36.0)
MCV: 89.9 fL (ref 80.0–100.0)
PLATELETS: 299 10*3/uL (ref 150–440)
RBC: 3.98 MIL/uL (ref 3.80–5.20)
RDW: 15.1 % — ABNORMAL HIGH (ref 11.5–14.5)
WBC: 9.9 10*3/uL (ref 3.6–11.0)

## 2015-05-31 LAB — BRAIN NATRIURETIC PEPTIDE: B Natriuretic Peptide: 38 pg/mL (ref 0.0–100.0)

## 2015-05-31 NOTE — ED Notes (Signed)
Stets SOB, decreased urination and lower ext swelling that started 4 hours ago, tp has hx of CKD stage 4 but not on dialysis, awake and alert in no acute distress

## 2015-05-31 NOTE — Discharge Instructions (Signed)
Your workup was quite reassuring today.  In fact, your kidney function was normal and you have no sign of congestive heart failure.  We recommend that you keep your legs propped up whenever possible and possibly use compression stockings.  Follow up with your nephrologist at the next available opportunity to discuss your symptoms, lab results from today, and additional medication recommendations.  Return to the emergency department if he develop new or worsening symptoms that concern you.   Peripheral Edema You have swelling in your legs (peripheral edema). This swelling is due to excess accumulation of salt and water in your body. Edema may be a sign of heart, kidney or liver disease, or a side effect of a medication. It may also be due to problems in the leg veins. Elevating your legs and using special support stockings may be very helpful, if the cause of the swelling is due to poor venous circulation. Avoid long periods of standing, whatever the cause. Treatment of edema depends on identifying the cause. Chips, pretzels, pickles and other salty foods should be avoided. Restricting salt in your diet is almost always needed. Water pills (diuretics) are often used to remove the excess salt and water from your body via urine. These medicines prevent the kidney from reabsorbing sodium. This increases urine flow. Diuretic treatment may also result in lowering of potassium levels in your body. Potassium supplements may be needed if you have to use diuretics daily. Daily weights can help you keep track of your progress in clearing your edema. You should call your caregiver for follow up care as recommended. SEEK IMMEDIATE MEDICAL CARE IF:   You have increased swelling, pain, redness, or heat in your legs.  You develop shortness of breath, especially when lying down.  You develop chest or abdominal pain, weakness, or fainting.  You have a fever.   This information is not intended to replace advice given  to you by your health care provider. Make sure you discuss any questions you have with your health care provider.   Document Released: 03/14/2004 Document Revised: 04/29/2011 Document Reviewed: 08/17/2014 Elsevier Interactive Patient Education [employer].

## 2015-05-31 NOTE — ED Provider Notes (Signed)
[facility] Emergency Department Provider Note  ____________________________________________  Time seen: Approximately 7:30 PM  I have reviewed the triage vital signs and the nursing notes.   HISTORY  Chief Complaint Shortness of Breath and Urinary Retention    HPI Amy Rocha is a 60 y.o. female with a PMH that reportedly includes stage IV chronic kidney disease, hyperlipidemia, CHF, and COPD who presents with concerns that she has not been able to urinate and that she is mildly short of breath and having lower extremity edema in both legs.  She reports that the edema came on acutely earlier tonight and that she had swelling up all the way to her knees in both legs.  There were not painful but swollen and pitting.  At the same time she also felt like she was not able to urinate normally.  She has no abdominal pain or flank pain, just feels that she is not able to go.  She denies fever/chills, chest pain, shortness of breath, nausea, vomiting, diarrhea.  She describes mild shortness of breath that is not made better or worse by anything in particular.  She is not wheezing nor coughing and has no upper respiratory symptoms.   Past Medical History  Diagnosis Date  . Hyperlipidemia   . Anxiety   . GERD (gastroesophageal reflux disease)   . Calculus of kidney   . Joint pain   . Urge incontinence   . Hypertension   . Avascular necrosis ([place])   . IBS (irritable bowel syndrome)   . Heart failure ([place])   . Asthma   . Chronic pain syndrome   . COPD (chronic obstructive pulmonary disease) ([place])   . Depression   . Arthritis   . Blood transfusion without reported diagnosis   . CHF (congestive heart failure) ([place])   . Oxygen deficiency   . Osteoporosis     Patient Active Problem List   Diagnosis Date Noted  . Bilateral occipital neuralgia 05/10/2015  . DDD (degenerative disc disease), lumbar 10/26/2014  . Facet syndrome, lumbar 10/26/2014  . History of  total hip replacement 10/26/2014  . Avascular necrosis of bones of both hips ([place]) 10/26/2014  . Sacroiliac joint dysfunction 10/26/2014  . Calculus of kidney 09/08/2014  . Acute anxiety 09/08/2014  . GERD (gastroesophageal reflux disease) 09/08/2014  . Joint pain of leg 09/08/2014  . Urge incontinence 09/08/2014  . Hypertensive heart failure ([place]) 09/08/2014  . Avascular necrosis ([place]) 09/08/2014  . Anemia 09/08/2014  . Atherosclerosis of coronary artery 09/08/2014  . IBS (irritable bowel syndrome) 09/08/2014  . Asthma 09/08/2014  . CHF (congestive heart failure) ([place]) 09/08/2014  . Hyperlipidemia 09/08/2014  . COPD (chronic obstructive pulmonary disease) ([place]) 09/08/2014  . Depression 09/08/2014  . Hypertensive CKD (chronic kidney disease) 09/08/2014  . CKD (chronic kidney disease), stage IV ([place]) 09/08/2014  . Proteinuria 09/08/2014  . Chronic pain 09/08/2014    Past Surgical History  Procedure Laterality Date  . Avascular necrosis    . Joint replacement      hip  . Nasal sinus surgery    . Eyelid lift    . Hernia repair    . Abdominal hysterectomy      Current Outpatient Rx  Name  Route  Sig  Dispense  Refill  . albuterol (PROVENTIL HFA;VENTOLIN HFA) 108 (90 BASE) MCG/ACT inhaler   Inhalation   Inhale 2 puffs into the lungs every 6 (six) hours as needed for wheezing or shortness of breath.         Marland Kitchen  amitriptyline (ELAVIL) 10 MG tablet      TAKE 2 TABLETS BY MOUTH AT BEDTIME AS NEEDED.   60 tablet   0     I'm just covering for [facility]; send next refill req ...   . amLODipine (NORVASC) 5 MG tablet   Oral   Take 1 tablet (5 mg total) by mouth daily.   90 tablet   1   . aspirin 81 MG tablet   Oral   Take 81 mg by mouth daily.         Marland Kitchen atorvastatin (LIPITOR) 40 MG tablet   Oral   Take 1 tablet (40 mg total) by mouth daily.   90 tablet   3   . buPROPion (WELLBUTRIN XL) 300 MG 24 hr tablet      TAKE 1 TABLET BY MOUTH ONCE DAILY.   30 tablet   12    . ciprofloxacin (CIPRO) 250 MG tablet   Oral   Take 1 tablet (250 mg total) by mouth 2 (two) times daily. Patient not taking: Reported on 05/18/2015   14 tablet   0   . ciprofloxacin (CIPRO) 250 MG tablet   Oral   Take 1 tablet (250 mg total) by mouth 2 (two) times daily. Patient not taking: Reported on 05/18/2015   14 tablet   0   . diclofenac (VOLTAREN) 50 MG EC tablet   Oral   Take 50 mg by mouth 2 (two) times daily. Reported on 05/18/2015         . diclofenac sodium (VOLTAREN) 1 % GEL      Apply a maximum of 2-4 g to painful area of skin 4 times per day as directed and if tolerated   500 g   0   . DULoxetine (CYMBALTA) 60 MG capsule   Oral   Take 1 capsule (60 mg total) by mouth daily.   90 capsule   1   . fluconazole (DIFLUCAN) 100 MG tablet   Oral   Take 1 tablet (100 mg total) by mouth daily. Patient not taking: Reported on 05/18/2015   7 tablet   0   . furosemide (LASIX) 40 MG tablet   Oral   Take 40 mg by mouth as needed.          . gabapentin (NEURONTIN) 300 MG capsule      Limit 1 capsule by mouth at bedtime for 3 days if tolerated then 1 capsule by mouth twice per day if tolerated and continue 1 capsule by mouth twice per day if tolerated   50 capsule   0   . metoprolol succinate (TOPROL-XL) 100 MG 24 hr tablet   Oral   Take 100 mg by mouth daily. Take with or immediately following a meal.         . omeprazole (PRILOSEC) 20 MG capsule   Oral   Take 20 mg by mouth daily.         Marland Kitchen oxybutynin (DITROPAN) 5 MG tablet   Oral   Take 5 mg by mouth at bedtime.         Marland Kitchen oxyCODONE-acetaminophen (PERCOCET) 10-325 MG tablet      Limit 3 - 5  tablets by mouth per day for breakthrough pain if tolerated while taking oxymorphone extended release   140 tablet   0   . Oxymorphone HCl, Crush Resist, 20 MG T12A      Limit 1 tablet by mouth every 8-12 hours if tolerated (NOTE pill is  now  20 mg and is ttwice is strong) Patient not taking: Reported  on 05/10/2015   90 tablet   0   . Oxymorphone HCl, Crush Resist, 20 MG T12A      Limit 1 tablet by mouth every 8-12 hours if tolerated (NOTE pill is now  20 mg and is ttwice is strong)   90 tablet   0   . valACYclovir (VALTREX) 1000 MG tablet   Oral   Take 1,000 mg by mouth 2 (two) times daily.         . valsartan (DIOVAN) 160 MG tablet   Oral   Take 160 mg by mouth daily.           Allergies Advair diskus and Tetracyclines & related  Family History  Problem Relation Age of Onset  . Dementia Mother   . Cancer Father     colon  . Arthritis Brother   . Hypertension Daughter   . Aneurysm Brother     Social History Social History  Substance Use Topics  . Smoking status: Former Research scientist (life sciences)  . Smokeless tobacco: Never Used  . Alcohol Use: No    Review of Systems Constitutional: No fever/chills Eyes: No visual changes. ENT: No sore throat. Cardiovascular: Denies chest pain. Respiratory: Mild shortness of breath. Gastrointestinal: No abdominal pain.  No nausea, no vomiting.  No diarrhea.  No constipation. Genitourinary: Negative for dysuria. Musculoskeletal: Negative for back pain. Bilateral lower extremity edema. Skin: Negative for rash. Neurological: Negative for headaches, focal weakness or numbness.  10-point ROS otherwise negative.  ____________________________________________   PHYSICAL EXAM:  VITAL SIGNS: ED Triage Vitals  Enc Vitals Group     BP 05/31/15 1702 125/63 mmHg     Pulse Rate 05/31/15 1702 109     Resp 05/31/15 1702 24     Temp 05/31/15 1702 97.5 F (36.4 C)     Temp Source 05/31/15 1702 Oral     SpO2 05/31/15 1702 96 %     Weight 05/31/15 1702 150 lb (68.04 kg)     Height 05/31/15 1702 5\' 4"  (1.626 m)     Head Cir --      Peak Flow --      Pain Score 05/31/15 1703 5     Pain Loc --      Pain Edu? --      Excl. in [place]? --     Constitutional: Alert and oriented. Well appearing and in no acute distress. Eyes: Conjunctivae are  normal. PERRL. EOMI. Head: Atraumatic. Nose: No congestion/rhinnorhea. Mouth/Throat: Mucous membranes are moist.  Oropharynx non-erythematous. Neck: No stridor.  No meningeal signs.   Cardiovascular: Normal rate, regular rhythm. Good peripheral circulation. Grossly normal heart sounds.   Respiratory: Normal respiratory effort.  No retractions. Lungs CTAB. Gastrointestinal: Soft and nontender. No distention.  Musculoskeletal: Trace pitting edema in bilateral lower extremities. No gross deformities of extremities. Neurologic:  Normal speech and language. No gross focal neurologic deficits are appreciated.  Skin:  Skin is warm, dry and intact. No rash noted. Psychiatric: Mood and affect are normal. Speech and behavior are normal.  ____________________________________________   LABS (all labs ordered are listed, but only abnormal results are displayed)  Labs Reviewed  BASIC METABOLIC PANEL - Abnormal; Notable for the following:    Glucose, Bld 111 (*)    Calcium 8.8 (*)    All other components within normal limits  CBC - Abnormal; Notable for the following:    RDW 15.1 (*)  All other components within normal limits  URINALYSIS COMPLETEWITH MICROSCOPIC ([facility] ONLY) - Abnormal; Notable for the following:    Color, Urine YELLOW (*)    APPearance HAZY (*)    Leukocytes, UA TRACE (*)    Bacteria, UA RARE (*)    Squamous Epithelial / LPF 0-5 (*)    All other components within normal limits  URINE CULTURE  TROPONIN I  BRAIN NATRIURETIC PEPTIDE   ____________________________________________  EKG  ED ECG REPORT I, Destanie Tibbetts, the attending physician, personally viewed and interpreted this ECG.  Date: 05/31/2015 EKG Time: 17:13 Rate: 103 Rhythm: Sinus tachycardia QRS Axis: normal Intervals: normal ST/T Wave abnormalities: normal Conduction Disturbances: none Narrative Interpretation: unremarkable  ____________________________________________  RADIOLOGY   Dg Chest 2  View  05/31/2015  CLINICAL DATA:  Shortness of breath. EXAM: CHEST  2 VIEW COMPARISON:  06/16/2010 FINDINGS: Mild peribronchial thickening. Scarring noted in the lingula. No confluent opacity on the right. No effusions or acute bony abnormality. IMPRESSION: Bronchitic changes.  Lingular scarring. Electronically Signed   By: Rolm Baptise M.D.   On: 05/31/2015 17:47    ____________________________________________   PROCEDURES  Procedure(s) performed: None  Critical Care performed: No ____________________________________________   INITIAL IMPRESSION / ASSESSMENT AND PLAN / ED COURSE  Pertinent labs & imaging results that were available during my care of the patient were reviewed by me and considered in my medical decision making (see chart for details).  The patient reports that her lower leg swelling has gotten significantly better since she has been lying flat with a leg slightly elevated in the emergency department.  Now she has just trace pitting edema.  Her workup is unremarkable.  In fact, her creatinine and GFR are normal.  Her BNP is normal.  She states that she has urinated a large amount twice since she has been here.  She is not concerned and asked if she is ready to go home and I assured her that follow up with her nephrologist is the best bet.  I did not recommend that she takes an extra dose of Lasix at this time given that there is no indication to do so but I told her that if she does get a significant amount of lower extremity edema and she should take an extra dose of Lasix and talk with her nephrologist about the issue.  She said she will follow-up tomorrow.  I gave my usual and customary return precautions.     ____________________________________________  FINAL CLINICAL IMPRESSION(S) / ED DIAGNOSES  Final diagnoses:  Peripheral edema      NEW MEDICATIONS STARTED DURING THIS VISIT:  Discharge Medication List as of 05/31/2015  8:22 PM        Note:  This  document was prepared using Dragon voice recognition software and may include unintentional dictation errors.   Hinda Kehr, MD 05/31/15 2117

## 2015-06-02 LAB — URINE CULTURE: SPECIAL REQUESTS: NORMAL

## 2015-06-05 ENCOUNTER — Encounter: Payer: Self-pay | Admitting: Unknown Physician Specialty

## 2015-06-05 ENCOUNTER — Ambulatory Visit (INDEPENDENT_AMBULATORY_CARE_PROVIDER_SITE_OTHER): Payer: Medicare Other | Admitting: Unknown Physician Specialty

## 2015-06-05 VITALS — BP 146/88 | HR 97 | Temp 97.6°F | Ht 64.0 in | Wt 172.0 lb

## 2015-06-05 DIAGNOSIS — J449 Chronic obstructive pulmonary disease, unspecified: Secondary | ICD-10-CM

## 2015-06-05 DIAGNOSIS — I129 Hypertensive chronic kidney disease with stage 1 through stage 4 chronic kidney disease, or unspecified chronic kidney disease: Secondary | ICD-10-CM | POA: Diagnosis not present

## 2015-06-05 DIAGNOSIS — N3941 Urge incontinence: Secondary | ICD-10-CM

## 2015-06-05 DIAGNOSIS — E785 Hyperlipidemia, unspecified: Secondary | ICD-10-CM

## 2015-06-05 DIAGNOSIS — F32A Depression, unspecified: Secondary | ICD-10-CM

## 2015-06-05 DIAGNOSIS — F329 Major depressive disorder, single episode, unspecified: Secondary | ICD-10-CM

## 2015-06-05 MED ORDER — FLUTICASONE PROPIONATE 50 MCG/ACT NA SUSP: 2.0000 | Freq: Every day | NASAL | Status: DC

## 2015-06-05 MED ORDER — BUDESONIDE-FORMOTEROL FUMARATE 160-4.5 MCG/ACT IN AERO: 2.0000 | INHALATION_SPRAY | Freq: Two times a day (BID) | RESPIRATORY_TRACT | Status: DC

## 2015-06-05 MED ORDER — ALBUTEROL SULFATE HFA 108 (90 BASE) MCG/ACT IN AERS: 2.0000 | INHALATION_SPRAY | Freq: Four times a day (QID) | RESPIRATORY_TRACT | Status: DC | PRN

## 2015-06-05 NOTE — Assessment & Plan Note (Signed)
Stable, continue present medications. Followed by nephrology.

## 2015-06-05 NOTE — Assessment & Plan Note (Addendum)
Depression is under poor control.  Patient is willing to see psychiatry. Will refer.

## 2015-06-05 NOTE — Progress Notes (Signed)
BP 146/88 mmHg  Pulse 97  Temp(Src) 97.6 F (36.4 C)  Ht 5\' 4"  (1.626 m)  Wt 172 lb (78.019 kg)  BMI 29.51 kg/m2  SpO2 94%  LMP 09/13/1975 (Approximate)   Subjective:    Patient ID: Amy Rocha, female    DOB: 1955-06-29, 60 y.o.   MRN: [id number]  HPI: Amy Rocha is a 60 y.o. female  Chief Complaint  Patient presents with  . Depression  . Hyperlipidemia  . Hypertension    Depression        This is a chronic problem.  The current episode started more than 1 year ago.   The problem occurs constantly.  Associated symptoms include decreased concentration, fatigue, insomnia, decreased interest, appetite change and body aches.  Associated symptoms include no suicidal ideas.  Past treatments include SNRIs - Serotonin and norepinephrine reuptake inhibitors and SSRIs - Selective serotonin reuptake inhibitors.  Compliance with treatment is good.  Improvement on treatment: patient unsure.  Hypertension Using medications without difficulty Average home BPs: doesn't take   No problems or lightheadedness No chest pain with exertion or shortness of breath No Edema: had swelling last week and went to E.D. Also wasn't urinating and patient stopped taking oxybutynin but hasn't followed up with Nephrologist.   Hyperlipidemia Using medications without problems No Muscle aches: denies Diet compliance: doesn't watch what she eats Exercise: doesn't currently exercise. Has told husband that she wants to start walking.   COPD: Patient has stopped taking Albuterol and Symbicort.    Relevant past medical, surgical, family and social history reviewed and updated as indicated. Interim medical history since our last visit reviewed. Allergies and medications reviewed and updated.  Review of Systems  Constitutional: Positive for appetite change and fatigue.  HENT: Negative.   Eyes: Negative.   Respiratory: Negative.   Cardiovascular: Negative.   Gastrointestinal: Negative.    Endocrine: Negative.   Genitourinary: Negative.   Musculoskeletal: Negative.   Skin: Negative.   Allergic/Immunologic: Negative.   Neurological: Negative.   Hematological: Negative.   Psychiatric/Behavioral: Positive for depression and decreased concentration. Negative for suicidal ideas. The patient has insomnia.     Per HPI unless specifically indicated above     Objective:    BP 146/88 mmHg  Pulse 97  Temp(Src) 97.6 F (36.4 C)  Ht 5\' 4"  (1.626 m)  Wt 172 lb (78.019 kg)  BMI 29.51 kg/m2  SpO2 94%  LMP 09/13/1975 (Approximate)  Wt Readings from Last 3 Encounters:  06/05/15 172 lb (78.019 kg)  05/31/15 150 lb (68.04 kg)  05/18/15 155 lb (70.308 kg)    Physical Exam  Constitutional: She is oriented to person, place, and time. She appears well-developed and well-nourished. No distress.  HENT:  Head: Normocephalic and atraumatic.  Eyes: Conjunctivae and lids are normal. Right eye exhibits no discharge. Left eye exhibits no discharge. No scleral icterus.  Neck: Normal range of motion. Neck supple. No JVD present. Carotid bruit is not present.  Cardiovascular: Normal rate, regular rhythm and normal heart sounds.   Pulmonary/Chest: Effort normal. She has wheezes in the left upper field and the left lower field.  Abdominal: Normal appearance. There is no splenomegaly or hepatomegaly.  Musculoskeletal: Normal range of motion.  Neurological: She is alert and oriented to person, place, and time.  Skin: Skin is warm, dry and intact. No rash noted. No pallor.  Psychiatric: She has a normal mood and affect. Her behavior is normal. Judgment and thought content normal.  Assessment & Plan:   Problem List Items Addressed This Visit      Unprioritized   COPD (chronic obstructive pulmonary disease) (HCC)    Restart Albuterol, Symbicort, and Fluticasone.       Relevant Medications   fluticasone (FLONASE) 50 MCG/ACT nasal spray   albuterol (PROVENTIL HFA;VENTOLIN HFA) 108  (90 Base) MCG/ACT inhaler   budesonide-formoterol (SYMBICORT) 160-4.5 MCG/ACT inhaler   Depression - Primary    Depression is under poor control.  Patient is willing to see psychiatry. Will refer.       Relevant Orders   Ambulatory referral to Psychiatry   Hyperlipidemia    Stable, continue present medications.        Hypertensive CKD (chronic kidney disease)    Stable, continue present medications. Followed by nephrology.        Urge incontinence    Stop Oxybutynin due to urinary retention          Follow up plan: Return in about 3 months (around 09/04/2015).

## 2015-06-05 NOTE — Assessment & Plan Note (Signed)
Stable, continue present medications.   

## 2015-06-05 NOTE — Assessment & Plan Note (Signed)
Stop Oxybutynin due to urinary retention

## 2015-06-05 NOTE — Assessment & Plan Note (Addendum)
Restart Albuterol, Symbicort, and Fluticasone.

## 2015-06-08 ENCOUNTER — Telehealth: Payer: Self-pay | Admitting: Unknown Physician Specialty

## 2015-06-08 NOTE — Telephone Encounter (Signed)
disregard

## 2015-06-15 ENCOUNTER — Encounter: Payer: Self-pay | Admitting: Pain Medicine

## 2015-06-15 ENCOUNTER — Telehealth: Payer: Self-pay

## 2015-06-15 ENCOUNTER — Telehealth: Payer: Self-pay | Admitting: Pain Medicine

## 2015-06-15 ENCOUNTER — Ambulatory Visit: Payer: Medicare Other | Attending: Pain Medicine | Admitting: Pain Medicine

## 2015-06-15 VITALS — BP 145/77 | HR 95 | Temp 98.1°F | Resp 16 | Ht 65.0 in | Wt 160.0 lb

## 2015-06-15 DIAGNOSIS — Z96643 Presence of artificial hip joint, bilateral: Secondary | ICD-10-CM | POA: Diagnosis not present

## 2015-06-15 DIAGNOSIS — M5481 Occipital neuralgia: Secondary | ICD-10-CM | POA: Diagnosis not present

## 2015-06-15 DIAGNOSIS — M5137 Other intervertebral disc degeneration, lumbosacral region: Secondary | ICD-10-CM | POA: Insufficient documentation

## 2015-06-15 DIAGNOSIS — G5600 Carpal tunnel syndrome, unspecified upper limb: Secondary | ICD-10-CM | POA: Diagnosis not present

## 2015-06-15 DIAGNOSIS — M87051 Idiopathic aseptic necrosis of right femur: Secondary | ICD-10-CM

## 2015-06-15 DIAGNOSIS — M8788 Other osteonecrosis, other site: Secondary | ICD-10-CM | POA: Insufficient documentation

## 2015-06-15 DIAGNOSIS — M5126 Other intervertebral disc displacement, lumbar region: Secondary | ICD-10-CM | POA: Insufficient documentation

## 2015-06-15 DIAGNOSIS — M419 Scoliosis, unspecified: Secondary | ICD-10-CM | POA: Insufficient documentation

## 2015-06-15 DIAGNOSIS — M47816 Spondylosis without myelopathy or radiculopathy, lumbar region: Secondary | ICD-10-CM

## 2015-06-15 DIAGNOSIS — M533 Sacrococcygeal disorders, not elsewhere classified: Secondary | ICD-10-CM | POA: Diagnosis not present

## 2015-06-15 DIAGNOSIS — M47817 Spondylosis without myelopathy or radiculopathy, lumbosacral region: Secondary | ICD-10-CM | POA: Diagnosis not present

## 2015-06-15 DIAGNOSIS — M5136 Other intervertebral disc degeneration, lumbar region: Secondary | ICD-10-CM | POA: Insufficient documentation

## 2015-06-15 DIAGNOSIS — M79606 Pain in leg, unspecified: Secondary | ICD-10-CM | POA: Diagnosis present

## 2015-06-15 DIAGNOSIS — M461 Sacroiliitis, not elsewhere classified: Secondary | ICD-10-CM | POA: Diagnosis not present

## 2015-06-15 DIAGNOSIS — M5416 Radiculopathy, lumbar region: Secondary | ICD-10-CM | POA: Diagnosis not present

## 2015-06-15 DIAGNOSIS — M87052 Idiopathic aseptic necrosis of left femur: Secondary | ICD-10-CM

## 2015-06-15 DIAGNOSIS — M545 Low back pain: Secondary | ICD-10-CM | POA: Diagnosis present

## 2015-06-15 MED ORDER — OXYCODONE-ACETAMINOPHEN 10-325 MG PO TABS: ORAL_TABLET | ORAL | Status: DC

## 2015-06-15 MED ORDER — OXYMORPHONE HCL ER 20 MG PO T12A: EXTENDED_RELEASE_TABLET | ORAL | Status: DC

## 2015-06-15 NOTE — Patient Instructions (Addendum)
PLAN   Continue present medications Cymbalta Neurontin Voltaren gel oxycodone acetaminophen oxymorphone extended release     F/U PCP C Wicker for evaliation of  BP and general medical  condition as discussed.  F/U surgical evaluation. May consider pending follow-up evaluations  Ask the nurses and secretary the date of your vascular evaluation as discussed for further evaluation of lower extremity pain  F/U neurological evaluation. May conside PNCV/EMG studies and other studies r pending follow-up evaluations  May consider radiofrequency rhizolysis or intraspinal procedures pending response to present treatment and F/U evaluation   Patient to call Pain Management Center should patient have concerns prior to scheduled return appointmentSacroiliac (SI) Joint Injection Patient Information  Description: The sacroiliac joint connects the scrum (very low back and tailbone) to the ilium (a pelvic bone which also forms half of the hip joint).  Normally this joint experiences very little motion.  When this joint becomes inflamed or unstable low back and or hip and pelvis pain may result.  Injection of this joint with local anesthetics (numbing medicines) and steroids can provide diagnostic information and reduce pain.  This injection is performed with the aid of x-ray guidance into the tailbone area while you are lying on your stomach.   You may experience an electrical sensation down the leg while this is being done.  You may also experience numbness.  We also may ask if we are reproducing your normal pain during the injection.  Conditions which may be treated SI injection:   Low back, buttock, hip or leg pain  Preparation for the Injection:  1. Do not eat any solid food or dairy products within 8 hours of your appointment.  2. You may drink clear liquids up to 3 hours before appointment.  Clear liquids include water, black coffee, juice or soda.  No milk or cream please. 3. You may take your  regular medications, including pain medications with a sip of water before your appointment.  Diabetics should hold regular insulin (if take separately) and take 1/2 normal NPH dose the morning of the procedure.  Carry some sugar containing items with you to your appointment. 4. A driver must accompany you and be prepared to drive you home after your procedure. 5. Bring all of your current medications with you. 6. An IV may be inserted and sedation may be given at the discretion of the physician. 7. A blood pressure cuff, EKG and other monitors will often be applied during the procedure.  Some patients may need to have extra oxygen administered for a short period.  8. You will be asked to provide medical information, including your allergies, prior to the procedure.  We must know immediately if you are taking blood thinners (like Coumadin/Warfarin) or if you are allergic to IV iodine contrast (dye).  We must know if you could possible be pregnant.  Possible side effects:   Bleeding from needle site  Infection (rare, may require surgery)  Nerve injury (rare)  Numbness & tingling (temporary)  A brief convulsion or seizure  Light-headedness (temporary)  Pain at injection site (several days)  Decreased blood pressure (temporary)  Weakness in the leg (temporary)   Call if you experience:   New onset weakness or numbness of an extremity below the injection site that last more than 8 hours.  Hives or difficulty breathing ( go to the emergency room)  Inflammation or drainage at the injection site  Any new symptoms which are concerning to you  Please note:  Although  the local anesthetic injected can often make your back/ hip/ buttock/ leg feel good for several hours after the injections, the pain will likely return.  It takes 3-7 days for steroids to work in the sacroiliac area.  You may not notice any pain relief for at least that one week.  If effective, we will often do a series  of three injections spaced 3-6 weeks apart to maximally decrease your pain.  After the initial series, we generally will wait some months before a repeat injection of the same type.  If you have any questions, please call [phone] [facility]  Pain Management Discharge Instructions  General Discharge Instructions :  If you need to reach your doctor call: Monday-Friday 8:00 am - 4:00 pm at [phone] or toll free [phone].  After clinic hours [phone] to have operator reach doctor.  Bring all of your medication bottles to all your appointments in the pain clinic.  To cancel or reschedule your appointment with Pain Management please remember to call 24 hours in advance to avoid a fee.  Refer to the educational materials which you have been given on: General Risks, I had my Procedure. Discharge Instructions, Post Sedation.  Post Procedure Instructions:  The drugs you were given will stay in your system until tomorrow, so for the next 24 hours you should not drive, make any legal decisions or drink any alcoholic beverages.  You may eat anything you prefer, but it is better to start with liquids then soups and crackers, and gradually work up to solid foods.  Please notify your doctor immediately if you have any unusual bleeding, trouble breathing or pain that is not related to your normal pain.  Depending on the type of procedure that was done, some parts of your body may feel week and/or numb.  This usually clears up by tonight or the next day.  Walk with the use of an assistive device or accompanied by an adult for the 24 hours.  You may use ice on the affected area for the first 24 hours.  Put ice in a Ziploc bag and cover with a towel and place against area 15 minutes on 15 minutes off.  You may switch to heat after 24 hours.GENERAL RISKS AND COMPLICATIONS  What are the risk, side effects and possible complications? Generally speaking,  most procedures are safe.  However, with any procedure there are risks, side effects, and the possibility of complications.  The risks and complications are dependent upon the sites that are lesioned, or the type of nerve block to be performed.  The closer the procedure is to the spine, the more serious the risks are.  Great care is taken when placing the radio frequency needles, block needles or lesioning probes, but sometimes complications can occur. 1. Infection: Any time there is an injection through the skin, there is a risk of infection.  This is why sterile conditions are used for these blocks.  There are four possible types of infection. 1. Localized skin infection. 2. Central Nervous System Infection-This can be in the form of Meningitis, which can be deadly. 3. Epidural Infections-This can be in the form of an epidural abscess, which can cause pressure inside of the spine, causing compression of the spinal cord with subsequent paralysis. This would require an emergency surgery to decompress, and there are no guarantees that the patient would recover from the paralysis. 4. Discitis-This is an infection of the intervertebral discs.  It occurs  in about 1% of discography procedures.  It is difficult to treat and it may lead to surgery.        2. Pain: the needles have to go through skin and soft tissues, will cause soreness.       3. Damage to internal structures:  The nerves to be lesioned may be near blood vessels or    other nerves which can be potentially damaged.       4. Bleeding: Bleeding is more common if the patient is taking blood thinners such as  aspirin, Coumadin, Ticiid, Plavix, etc., or if he/she have some genetic predisposition  such as hemophilia. Bleeding into the spinal canal can cause compression of the spinal  cord with subsequent paralysis.  This would require an emergency surgery to  decompress and there are no guarantees that the patient would recover from the  paralysis.        5. Pneumothorax:  Puncturing of a lung is a possibility, every time a needle is introduced in  the area of the chest or upper back.  Pneumothorax refers to free air around the  collapsed lung(s), inside of the thoracic cavity (chest cavity).  Another two possible  complications related to a similar event would include: Hemothorax and Chylothorax.   These are variations of the Pneumothorax, where instead of air around the collapsed  lung(s), you may have blood or chyle, respectively.       6. Spinal headaches: They may occur with any procedures in the area of the spine.       7. Persistent CSF (Cerebro-Spinal Fluid) leakage: This is a rare problem, but may occur  with prolonged intrathecal or epidural catheters either due to the formation of a fistulous  track or a dural tear.       8. Nerve damage: By working so close to the spinal cord, there is always a possibility of  nerve damage, which could be as serious as a permanent spinal cord injury with  paralysis.       9. Death:  Although rare, severe deadly allergic reactions known as "Anaphylactic  reaction" can occur to any of the medications used.      10. Worsening of the symptoms:  We can always make thing worse.  What are the chances of something like this happening? Chances of any of this occuring are extremely low.  By statistics, you have more of a chance of getting killed in a motor vehicle accident: while driving to the hospital than any of the above occurring .  Nevertheless, you should be aware that they are possibilities.  In general, it is similar to taking a shower.  Everybody knows that you can slip, hit your head and get killed.  Does that mean that you should not shower again?  Nevertheless always keep in mind that statistics do not mean anything if you happen to be on the wrong side of them.  Even if a procedure has a 1 (one) in a 1,000,000 (million) chance of going wrong, it you happen to be that one..Also, keep in mind that by  statistics, you have more of a chance of having something go wrong when taking medications.  Who should not have this procedure? If you are on a blood thinning medication (e.g. Coumadin, Plavix, see list of "Blood Thinners"), or if you have an active infection going on, you should not have the procedure.  If you are taking any blood thinners, please inform your physician.  How  should I prepare for this procedure?  Do not eat or drink anything at least six hours prior to the procedure.  Bring a driver with you .  It cannot be a taxi.  Come accompanied by an adult that can drive you back, and that is strong enough to help you if your legs get weak or numb from the local anesthetic.  Take all of your medicines the morning of the procedure with just enough water to swallow them.  If you have diabetes, make sure that you are scheduled to have your procedure done first thing in the morning, whenever possible.  If you have diabetes, take only half of your insulin dose and notify our nurse that you have done so as soon as you arrive at the clinic.  If you are diabetic, but only take blood sugar pills (oral hypoglycemic), then do not take them on the morning of your procedure.  You may take them after you have had the procedure.  Do not take aspirin or any aspirin-containing medications, at least eleven (11) days prior to the procedure.  They may prolong bleeding.  Wear loose fitting clothing that may be easy to take off and that you would not mind if it got stained with Betadine or blood.  Do not wear any jewelry or perfume  Remove any nail coloring.  It will interfere with some of our monitoring equipment.  NOTE: Remember that this is not meant to be interpreted as a complete list of all possible complications.  Unforeseen problems may occur.  BLOOD THINNERS The following drugs contain aspirin or other products, which can cause increased bleeding during surgery and should not be taken for 2  weeks prior to and 1 week after surgery.  If you should need take something for relief of minor pain, you may take acetaminophen which is found in Tylenol,m Datril, Anacin-3 and Panadol. It is not blood thinner. The products listed below are.  Do not take any of the products listed below in addition to any listed on your instruction sheet.  A.P.C or A.P.C with Codeine Codeine Phosphate Capsules #3 Ibuprofen Ridaura  ABC compound Congesprin Imuran rimadil  Advil Cope Indocin Robaxisal  Alka-Seltzer Effervescent Pain Reliever and Antacid Coricidin or Coricidin-D  Indomethacin Rufen  Alka-Seltzer plus Cold Medicine Cosprin Ketoprofen S-A-C Tablets  Anacin Analgesic Tablets or Capsules Coumadin Korlgesic Salflex  Anacin Extra Strength Analgesic tablets or capsules CP-2 Tablets Lanoril Salicylate  Anaprox Cuprimine Capsules Levenox Salocol  Anexsia-D Dalteparin Magan Salsalate  Anodynos Darvon compound Magnesium Salicylate Sine-off  Ansaid Dasin Capsules Magsal Sodium Salicylate  Anturane Depen Capsules Marnal Soma  APF Arthritis pain formula Dewitt's Pills Measurin Stanback  Argesic Dia-Gesic Meclofenamic Sulfinpyrazone  Arthritis Bayer Timed Release Aspirin Diclofenac Meclomen Sulindac  Arthritis pain formula Anacin Dicumarol Medipren Supac  Analgesic (Safety coated) Arthralgen Diffunasal Mefanamic Suprofen  Arthritis Strength Bufferin Dihydrocodeine Mepro Compound Suprol  Arthropan liquid Dopirydamole Methcarbomol with Aspirin Synalgos  ASA tablets/Enseals Disalcid Micrainin Tagament  Ascriptin Doan's Midol Talwin  Ascriptin A/D Dolene Mobidin Tanderil  Ascriptin Extra Strength Dolobid Moblgesic Ticlid  Ascriptin with Codeine Doloprin or Doloprin with Codeine Momentum Tolectin  Asperbuf Duoprin Mono-gesic Trendar  Aspergum Duradyne Motrin or Motrin IB Triminicin  Aspirin plain, buffered or enteric coated Durasal Myochrisine Trigesic  Aspirin Suppositories Easprin Nalfon Trillsate   Aspirin with Codeine Ecotrin Regular or Extra Strength Naprosyn Uracel  Atromid-S Efficin Naproxen Ursinus  Auranofin Capsules Elmiron Neocylate Vanquish  Axotal Emagrin Norgesic Verin  Azathioprine Empirin  or Empirin with Codeine Normiflo Vitamin E  Azolid Emprazil Nuprin Voltaren  Bayer Aspirin plain, buffered or children's or timed BC Tablets or powders Encaprin Orgaran Warfarin Sodium  Buff-a-Comp Enoxaparin Orudis Zorpin  Buff-a-Comp with Codeine Equegesic Os-Cal-Gesic   Buffaprin Excedrin plain, buffered or Extra Strength Oxalid   Bufferin Arthritis Strength Feldene Oxphenbutazone   Bufferin plain or Extra Strength Feldene Capsules Oxycodone with Aspirin   Bufferin with Codeine Fenoprofen Fenoprofen Pabalate or Pabalate-SF   Buffets II Flogesic Panagesic   Buffinol plain or Extra Strength Florinal or Florinal with Codeine Panwarfarin   Buf-Tabs Flurbiprofen Penicillamine   Butalbital Compound Four-way cold tablets Penicillin   Butazolidin Fragmin Pepto-Bismol   Carbenicillin Geminisyn Percodan   Carna Arthritis Reliever Geopen Persantine   Carprofen Gold's salt Persistin   Chloramphenicol Goody's Phenylbutazone   Chloromycetin Haltrain Piroxlcam   Clmetidine heparin Plaquenil   Cllnoril Hyco-pap Ponstel   Clofibrate Hydroxy chloroquine Propoxyphen         Before stopping any of these medications, be sure to consult the physician who ordered them.  Some, such as Coumadin (Warfarin) are ordered to prevent or treat serious conditions such as "deep thrombosis", "pumonary embolisms", and other heart problems.  The amount of time that you may need off of the medication may also vary with the medication and the reason for which you were taking it.  If you are taking any of these medications, please make sure you notify your pain physician before you undergo any procedures.

## 2015-06-15 NOTE — Telephone Encounter (Signed)
Spoke with pharmacy, needs PA because quantity limit is exceeded.

## 2015-06-15 NOTE — Progress Notes (Signed)
Subjective:    Patient ID: Amy Rocha, female    DOB: 02-Apr-1955, 60 y.o.   MRN: [id number]  HPI  The patient is a 60 year old female who returns to pain management for further evaluation and treatment of pain involving the lower back and lower extremity regions. The patient states the pain radiation the lumbar region across the buttocks aggravated by standing and walking. Patient also has pain occurring along the side of the lower extremities which is aggravated by standing and walking. We discussed patient's condition and will consider patient for interventional treatment at time return appointment consisting of block of nerves to the sacroiliac joint with myoneural block injections as well. We will continue medications consisting of phantom ER with oxycodone acetaminophen for breakthrough pain the patient denies any trauma significant change in events of daily living because change in symptomatology. We will continue medications consisting of Cymbalta Neurontin oxycodone acetaminophen and oxymorphone. The patient was agreed to suggested treatment plan and we will proceed with block of nerves to the sacroiliac joint at time return appointment in attempt to decrease severity of symptoms, minimize progression of symptoms, and avoid need for more involved treatment. All agreed to suggested treatment plan  Review of Systems     Objective:   Physical Exam There was tenderness to palpation of paraspinal muscular treat the cervical region cervical facet region palpation which be produced pain of minimal degree with minimal tenderness over the splenius capitis and occipitalis musculature regions. Patient was a tennis to palpation of the thoracic region thoracic facet region of mild degree with no crepitus of the thoracic region noted. The patient was with tenderness of the acromioclavicular and glenohumeral joint region of minimal degree and was with unremarkable Spurling's maneuver The patient   appeared to be with bilaterally equal grip strength and Tinel and Phalen's maneuver were without increased pain of significant degree. There was tenderness to palpation of paraspinal misreading thoracic region thoracic region with no crepitus of the thoracic region noted. Palpation over the region of the lumbar paraspinal must reason lumbar facet region was with moderate tenderness to palpation with lateral bending rotation extension and palpation of the lumbar facets reproducing moderate discomfort. There was moderate to severe tends to palpation of the PSIS and PII S region with mild to moderate tenderness of the greater trochanteric region iliotibial band region. Straight leg raising was tolerates approximately 30 without increased pain with dorsiflexion noted. DTRs appeared to be trace at the knees. There was negative clonus negative Homans. No definite sensory deficit or dermatomal distribution detected. Abdomen nontender with no costovertebral tenderness noted palpation of the PSIS and PII S region reproduced predominant portion of patient's pain       Assessment & Plan:    Degenerative disc disease lumbar spine Convex left scoliosis. Degenerative disc disease with facet hypertrophy at multiple levels involving L4-5 and L5-S1 predominantly. Mild disc bulging at L1-2 L4-5.  Lumbar facet syndrome  Sacroiliac joint dysfunction  Status post bilateral total hip replacement  Avascular necrosis of hips  Status post spinal cord stimulator placement complicated by infection requiring explantation of spinal cord stimulator and healing by secondary intention  Carpal tunnel syndrome  Complex regional pain syndrome of lower extremities versus vascular compromise     PLAN   Continue present medications Cymbalta Neurontin Voltaren gel oxycodone acetaminophen oxymorphone extended release    Block of nerves to the sacroiliac joint to be performed at time of return appointment   F/U PCP C  Julian Hy  for evaliation of  BP and general medical  condition as discussed.  F/U surgical evaluation. May consider pending follow-up evaluations  Ask the nurses and secretary the date of your vascular evaluation as discussed for further evaluation of lower extremity pain  F/U neurological evaluation. May conside PNCV/EMG studies and other studies r pending follow-up evaluations  May consider radiofrequency rhizolysis or intraspinal procedures pending response to present treatment and F/U evaluation   Patient to call Pain Management Center should patient have concerns prior to scheduled return appointment

## 2015-06-15 NOTE — Telephone Encounter (Signed)
Attempted to do PA on covermymeds for Oxycodone/Acet. It said PA not needed. Attempted to call patient to get her insurance information, no answer. Unable to leave a message.

## 2015-06-15 NOTE — Progress Notes (Signed)
Safety precautions to be maintained throughout the outpatient stay will include: orient to surroundings, keep bed in low position, maintain call bell within reach at all times, provide assistance with transfer out of bed and ambulation.  

## 2015-06-15 NOTE — Telephone Encounter (Signed)
Attempted to initiate PA on [email or web address]. It says PA not needed. They suggested I call patient's insurance and ask if/why Oxycodone/Acet is not covered. Attempted to call patient to get her insurance information, no answer. Unable to leave a message.

## 2015-06-15 NOTE — Telephone Encounter (Signed)
Pt needs prio authorization for oxycodone and oxymorphone [id number]

## 2015-06-15 NOTE — Telephone Encounter (Signed)
She dropped her scripts for pharmacy to have on file and pharmacy needs verification, she was not trying to get scripts filled, [facility] [phone], please call patient she if very upset

## 2015-06-16 NOTE — Telephone Encounter (Signed)
Attempted to call patient.  No answer.

## 2015-06-21 ENCOUNTER — Encounter: Payer: Self-pay | Admitting: Pain Medicine

## 2015-06-21 ENCOUNTER — Ambulatory Visit: Payer: Medicare Other | Attending: Pain Medicine | Admitting: Pain Medicine

## 2015-06-21 VITALS — BP 128/82 | HR 78 | Temp 98.1°F | Resp 18 | Ht 65.0 in | Wt 155.0 lb

## 2015-06-21 DIAGNOSIS — Z96643 Presence of artificial hip joint, bilateral: Secondary | ICD-10-CM

## 2015-06-21 DIAGNOSIS — M791 Myalgia: Secondary | ICD-10-CM | POA: Insufficient documentation

## 2015-06-21 DIAGNOSIS — M5136 Other intervertebral disc degeneration, lumbar region: Secondary | ICD-10-CM | POA: Diagnosis not present

## 2015-06-21 DIAGNOSIS — M5481 Occipital neuralgia: Secondary | ICD-10-CM

## 2015-06-21 DIAGNOSIS — M545 Low back pain: Secondary | ICD-10-CM | POA: Diagnosis present

## 2015-06-21 DIAGNOSIS — M5126 Other intervertebral disc displacement, lumbar region: Secondary | ICD-10-CM | POA: Insufficient documentation

## 2015-06-21 DIAGNOSIS — M47816 Spondylosis without myelopathy or radiculopathy, lumbar region: Secondary | ICD-10-CM

## 2015-06-21 DIAGNOSIS — M419 Scoliosis, unspecified: Secondary | ICD-10-CM | POA: Diagnosis not present

## 2015-06-21 DIAGNOSIS — M47817 Spondylosis without myelopathy or radiculopathy, lumbosacral region: Secondary | ICD-10-CM | POA: Diagnosis not present

## 2015-06-21 DIAGNOSIS — M87051 Idiopathic aseptic necrosis of right femur: Secondary | ICD-10-CM

## 2015-06-21 DIAGNOSIS — M87052 Idiopathic aseptic necrosis of left femur: Secondary | ICD-10-CM

## 2015-06-21 DIAGNOSIS — M533 Sacrococcygeal disorders, not elsewhere classified: Secondary | ICD-10-CM

## 2015-06-21 DIAGNOSIS — M79606 Pain in leg, unspecified: Secondary | ICD-10-CM | POA: Diagnosis present

## 2015-06-21 MED ORDER — TRIAMCINOLONE ACETONIDE 40 MG/ML IJ SUSP
INTRAMUSCULAR | Status: AC
  Administered 2015-06-21: 40 mg
  Filled 2015-06-21: qty 1

## 2015-06-21 MED ORDER — CIPROFLOXACIN IN D5W 400 MG/200ML IV SOLN
400.0000 mg | Freq: Once | INTRAVENOUS | Status: AC
  Administered 2015-06-21: 400 mg via INTRAVENOUS

## 2015-06-21 MED ORDER — FENTANYL CITRATE (PF) 100 MCG/2ML IJ SOLN
100.0000 ug | Freq: Once | INTRAMUSCULAR | Status: AC
  Administered 2015-06-21: 100 ug via INTRAVENOUS

## 2015-06-21 MED ORDER — LACTATED RINGERS IV SOLN: 1000.0000 mL | INTRAVENOUS | Status: DC

## 2015-06-21 MED ORDER — MIDAZOLAM HCL 5 MG/5ML IJ SOLN
5.0000 mg | Freq: Once | INTRAMUSCULAR | Status: AC
  Administered 2015-06-21: 5 mg via INTRAVENOUS

## 2015-06-21 MED ORDER — TRIAMCINOLONE ACETONIDE 40 MG/ML IJ SUSP
40.0000 mg | Freq: Once | INTRAMUSCULAR | Status: AC
  Administered 2015-06-21: 40 mg

## 2015-06-21 MED ORDER — ORPHENADRINE CITRATE 30 MG/ML IJ SOLN
INTRAMUSCULAR | Status: AC
  Filled 2015-06-21: qty 2

## 2015-06-21 MED ORDER — MIDAZOLAM HCL 5 MG/5ML IJ SOLN
INTRAMUSCULAR | Status: AC
  Administered 2015-06-21: 5 mg via INTRAVENOUS
  Filled 2015-06-21: qty 5

## 2015-06-21 MED ORDER — CIPROFLOXACIN IN D5W 400 MG/200ML IV SOLN
INTRAVENOUS | Status: AC
  Administered 2015-06-21: 400 mg via INTRAVENOUS
  Filled 2015-06-21: qty 200

## 2015-06-21 MED ORDER — BUPIVACAINE HCL (PF) 0.25 % IJ SOLN
30.0000 mL | Freq: Once | INTRAMUSCULAR | Status: AC
  Administered 2015-06-21: 30 mL

## 2015-06-21 MED ORDER — CIPROFLOXACIN HCL 250 MG PO TABS: 250.0000 mg | ORAL_TABLET | Freq: Two times a day (BID) | ORAL | Status: DC

## 2015-06-21 MED ORDER — ORPHENADRINE CITRATE 30 MG/ML IJ SOLN: 60.0000 mg | Freq: Once | INTRAMUSCULAR | Status: DC

## 2015-06-21 MED ORDER — BUPIVACAINE HCL (PF) 0.25 % IJ SOLN
INTRAMUSCULAR | Status: AC
  Administered 2015-06-21: 30 mL
  Filled 2015-06-21: qty 30

## 2015-06-21 MED ORDER — FENTANYL CITRATE (PF) 100 MCG/2ML IJ SOLN
INTRAMUSCULAR | Status: AC
  Administered 2015-06-21: 100 ug via INTRAVENOUS
  Filled 2015-06-21: qty 2

## 2015-06-21 NOTE — Patient Instructions (Addendum)
PLAN   Continue present medications Cymbalta Voltaren gel oxycodone acetaminophen oxymorphone extended release Please obtain Cipro antibiotic today and begin taking Cipro antibiotic as prescribed .  F/U PCP C Wicker for evaliation of  BP headache and general medical  condition as discussed.   F/U surgical evaluation. May consider pending follow-up evaluations  F/U neurological evaluation. May consider further evaluation of headache with neurological consultation pending response to procedure performed today and follow-up evaluations  May consider radiofrequency rhizolysis or intraspinal procedures pending response to present treatment and F/U evaluation   Patient to call Pain Management Center should patient have concerns prior to scheduled return appointmentPain Management Discharge Instructions  General Discharge Instructions :  If you need to reach your doctor call: Monday-Friday 8:00 am - 4:00 pm at [phone] or toll free [phone].  After clinic hours [phone] to have operator reach doctor.  Bring all of your medication bottles to all your appointments in the pain clinic.  To cancel or reschedule your appointment with Pain Management please remember to call 24 hours in advance to avoid a fee.  Refer to the educational materials which you have been given on: General Risks, I had my Procedure. Discharge Instructions, Post Sedation.  Post Procedure Instructions:  The drugs you were given will stay in your system until tomorrow, so for the next 24 hours you should not drive, make any legal decisions or drink any alcoholic beverages.  You may eat anything you prefer, but it is better to start with liquids then soups and crackers, and gradually work up to solid foods.  Please notify your doctor immediately if you have any unusual bleeding, trouble breathing or pain that is not related to your normal pain.  Depending on the type of procedure that was done, some parts of  your body may feel week and/or numb.  This usually clears up by tonight or the next day.  Walk with the use of an assistive device or accompanied by an adult for the 24 hours.  You may use ice on the affected area for the first 24 hours.  Put ice in a Ziploc bag and cover with a towel and place against area 15 minutes on 15 minutes off.  You may switch to heat after 24 hours.

## 2015-06-21 NOTE — Progress Notes (Signed)
Subjective:    Patient ID: Amy Rocha, female    DOB: 08/23/1955, 60 y.o.   MRN: [id number]  HPI  PROCEDURE:  Block of nerves to the sacroiliac joint.   NOTE:  The patient is a 60 y.o. female who returns to the [facility] for further evaluation and treatment of pain involving the lower back and lower extremity region with pain in the region of the buttocks as well. Prior MRI studies reveal degenerative disc disease lumbar spine Convex left scoliosis. Degenerative disc disease with facet hypertrophy at multiple levels involving L4-5 and L5-S1 predominantly. Mild disc bulging at L1-2 L4-5..  the patient is with reproduction of severe pain with palpation over the PSIS and PII S regions and is with positive Patrick's maneuver  There is concern regarding a significant component of the patient's pain being due to sacroiliac joint dysfunction The risks, benefits, expectations of the procedure have been discussed and explained to the patient who is understanding and willing to proceed with interventional treatment in attempt to decrease severity of patient's symptoms, minimize the risk of medication escalation and  hopefully retard the progression of the patient's symptoms. We will proceed with what is felt to be a medically necessary procedure, block of nerves to the sacroiliac joint.   DESCRIPTION OF PROCEDURE:  Block of nerves to the sacroiliac joint.   The patient was taken to the fluoroscopy suite. With the patient in the prone position with EKG, blood pressure, pulse, capnography, and pulse oximetry monitoring, IV Versed, IV fentanyl conscious sedation, Betadine prep of proposed entry site was performed.   Block of nerves at the L5 vertebral body level.   With the patient in prone position, under fluoroscopic guidance, a 22 -gauge needle was inserted at the L5 vertebral body level on the left side. With 15 degrees oblique orientation a 22 -gauge needle was inserted in the region  known as Burton's eye or eye of the Scotty dog. Following documentation of needle placement in the area of Burton's eye or eye of the Scotty dog under fluoroscopic guidance, needle placement was then accomplished at the sacral ala level on the left side.   Needle placement at the sacral ala.   With the patient in prone position under fluoroscopic guidance with AP view of the lumbosacral spine, a 22 -gauge needle was inserted in the region known as the sacral ala on the left side. Following documentation of needle placement on the left side under fluoroscopic guidance needle placement was then accomplished at the S1 foramen level.   Needle placement at the S1 foramen level.   With the patient in prone position under fluoroscopic guidance with AP view of the lumbosacral spine and cephalad orientation, a 22 -gauge needle was inserted at the superior and lateral border of the S1 foramen on the left side. Following documentation of needle placement at the S1 foramen level on the left side, needle placement was then accomplished at the S2 foramen level on the left side.   Needle placement at the S2 foramen level.   With the patient in prone position with AP view of the lumbosacral spine with cephalad orientation, a 22 - gauge needle was inserted at the superior and lateral border of the S2 foramen under fluoroscopic guidance on the left side. Following needle placement at the L5 vertebral body level, sacral ala, S1 foramen and S2 foramen on the left side, needle placement was verified on lateral view under fluoroscopic guidance.  Following needle placement  documentation on lateral view, each needle was injected with 1 mL of 0.25% bupivacaine and Kenalog.   BLOCK OF THE NERVES TO SACROILIAC JOINT ON THE RIGHT SIDE The procedure was performed on the right side at the same levels as was performed on the left side and utilizing the same technique as on the left side and was performed under fluoroscopic  guidance as on the left side   A total of 10mg  of Kenalog was utilized for the procedure.   PLAN:  1. Medications: The patient will continue presently prescribed medications Cymbalta oxycodone acetaminophen Opana ER and Voltaren gel.  2. The patient will be considered for modification of treatment regimen pending response to the procedure performed on today's visit.  3. The patient is to follow-up with primary care physician C Wicker for evaluation of blood pressure and general medical condition following the procedure performed on today's visit.  4. Surgical evaluation as discussed.  5. Neurological evaluation as discussed.  6. The patient may be a candidate for radiofrequency procedures, implantation devices and other treatment pending response to treatment performed on today's visit and follow-up evaluation.  7. The patient has been advised to adhere to proper body mechanics and to avoid activities which may exacerbate the patient's symptoms.   Return appointment to [facility] as scheduled.    Review of Systems     Objective:   Physical Exam        Assessment & Plan:

## 2015-06-21 NOTE — Progress Notes (Signed)
Safety precautions to be maintained throughout the outpatient stay will include: orient to surroundings, keep bed in low position, maintain call bell within reach at all times, provide assistance with transfer out of bed and ambulation.  

## 2015-06-22 ENCOUNTER — Telehealth: Payer: Self-pay

## 2015-06-22 NOTE — Telephone Encounter (Signed)
Post procedure phone call.  States she is doing good.  

## 2015-07-11 ENCOUNTER — Ambulatory Visit: Payer: Medicare Other | Attending: Pain Medicine | Admitting: Pain Medicine

## 2015-07-11 ENCOUNTER — Encounter: Payer: Self-pay | Admitting: Pain Medicine

## 2015-07-11 VITALS — BP 131/80 | HR 107 | Temp 97.7°F | Resp 18 | Ht 63.0 in | Wt 155.0 lb

## 2015-07-11 DIAGNOSIS — M87850 Other osteonecrosis, pelvis: Secondary | ICD-10-CM | POA: Insufficient documentation

## 2015-07-11 DIAGNOSIS — M533 Sacrococcygeal disorders, not elsewhere classified: Secondary | ICD-10-CM | POA: Diagnosis not present

## 2015-07-11 DIAGNOSIS — M5126 Other intervertebral disc displacement, lumbar region: Secondary | ICD-10-CM | POA: Insufficient documentation

## 2015-07-11 DIAGNOSIS — M419 Scoliosis, unspecified: Secondary | ICD-10-CM | POA: Insufficient documentation

## 2015-07-11 DIAGNOSIS — M47816 Spondylosis without myelopathy or radiculopathy, lumbar region: Secondary | ICD-10-CM

## 2015-07-11 DIAGNOSIS — G5600 Carpal tunnel syndrome, unspecified upper limb: Secondary | ICD-10-CM | POA: Insufficient documentation

## 2015-07-11 DIAGNOSIS — Z9689 Presence of other specified functional implants: Secondary | ICD-10-CM | POA: Insufficient documentation

## 2015-07-11 DIAGNOSIS — G90529 Complex regional pain syndrome I of unspecified lower limb: Secondary | ICD-10-CM | POA: Diagnosis not present

## 2015-07-11 DIAGNOSIS — Z96643 Presence of artificial hip joint, bilateral: Secondary | ICD-10-CM | POA: Diagnosis not present

## 2015-07-11 DIAGNOSIS — M545 Low back pain: Secondary | ICD-10-CM | POA: Diagnosis present

## 2015-07-11 DIAGNOSIS — G90523 Complex regional pain syndrome I of lower limb, bilateral: Secondary | ICD-10-CM

## 2015-07-11 DIAGNOSIS — M79606 Pain in leg, unspecified: Secondary | ICD-10-CM | POA: Diagnosis present

## 2015-07-11 DIAGNOSIS — M47817 Spondylosis without myelopathy or radiculopathy, lumbosacral region: Secondary | ICD-10-CM | POA: Diagnosis not present

## 2015-07-11 DIAGNOSIS — M5416 Radiculopathy, lumbar region: Secondary | ICD-10-CM | POA: Diagnosis not present

## 2015-07-11 DIAGNOSIS — M5136 Other intervertebral disc degeneration, lumbar region: Secondary | ICD-10-CM | POA: Diagnosis not present

## 2015-07-11 DIAGNOSIS — M5481 Occipital neuralgia: Secondary | ICD-10-CM

## 2015-07-11 DIAGNOSIS — M5137 Other intervertebral disc degeneration, lumbosacral region: Secondary | ICD-10-CM | POA: Insufficient documentation

## 2015-07-11 DIAGNOSIS — M87052 Idiopathic aseptic necrosis of left femur: Secondary | ICD-10-CM

## 2015-07-11 DIAGNOSIS — M791 Myalgia: Secondary | ICD-10-CM | POA: Diagnosis not present

## 2015-07-11 DIAGNOSIS — M87051 Idiopathic aseptic necrosis of right femur: Secondary | ICD-10-CM

## 2015-07-11 MED ORDER — OXYCODONE-ACETAMINOPHEN 10-325 MG PO TABS: ORAL_TABLET | ORAL | Status: DC

## 2015-07-11 MED ORDER — OXYMORPHONE HCL ER 20 MG PO T12A: EXTENDED_RELEASE_TABLET | ORAL | Status: DC

## 2015-07-11 NOTE — Patient Instructions (Addendum)
PLAN   Continue present medications Cymbalta Neurontin Voltaren gel oxycodone acetaminophen oxymorphone extended release     Lumbar sympathetic block to be performed at time return appointment  F/U PCP Amy Rocha for evaliation of  BP and general medical  condition as discussed.  F/U surgical evaluation. May consider pending follow-up evaluations  Ask the nurses and secretary the date of your vascular evaluation as discussed for further evaluation of lower extremity pain  F/U neurological evaluation. May conside PNCV/EMG studies and other studies r pending follow-up evaluations  May consider radiofrequency rhizolysis or intraspinal procedures pending response to present treatment and F/U evaluation   Patient to call [facility] Management Center should patient have concerns prior to scheduled return appointmentGENERAL RISKS AND COMPLICATIONS  What are the risk, side effects and possible complications? Generally speaking, most procedures are safe.  However, with any procedure there are risks, side effects, and the possibility of complications.  The risks and complications are dependent upon the sites that are lesioned, or the type of nerve block to be performed.  The closer the procedure is to the spine, the more serious the risks are.  Great care is taken when placing the radio frequency needles, block needles or lesioning probes, but sometimes complications can occur. 1. Infection: Any time there is an injection through the skin, there is a risk of infection.  This is why sterile conditions are used for these blocks.  There are four possible types of infection. 1. Localized skin infection. 2. Central Nervous System Infection-This can be in the form of Meningitis, which can be deadly. 3. Epidural Infections-This can be in the form of an epidural abscess, which can cause pressure inside of the spine, causing compression of the spinal cord with subsequent paralysis. This would require an emergency  surgery to decompress, and there are no guarantees that the patient would recover from the paralysis. 4. Discitis-This is an infection of the intervertebral discs.  It occurs in about 1% of discography procedures.  It is difficult to treat and it may lead to surgery.        2. Pain: the needles have to go through skin and soft tissues, will cause soreness.       3. Damage to internal structures:  The nerves to be lesioned may be near blood vessels or    other nerves which can be potentially damaged.       4. Bleeding: Bleeding is more common if the patient is taking blood thinners such as  aspirin, Coumadin, Ticiid, Plavix, etc., or if he/she have some genetic predisposition  such as hemophilia. Bleeding into the spinal canal can cause compression of the spinal  cord with subsequent paralysis.  This would require an emergency surgery to  decompress and there are no guarantees that the patient would recover from the  paralysis.       5. Pneumothorax:  Puncturing of a lung is a possibility, every time a needle is introduced in  the area of the chest or upper back.  Pneumothorax refers to free air around the  collapsed lung(s), inside of the thoracic cavity (chest cavity).  Another two possible  complications related to a similar event would include: Hemothorax and Chylothorax.   These are variations of the Pneumothorax, where instead of air around the collapsed  lung(s), you may have blood or chyle, respectively.       6. Spinal headaches: They may occur with any procedures in the area of the spine.  7. Persistent CSF (Cerebro-Spinal Fluid) leakage: This is a rare problem, but may occur  with prolonged intrathecal or epidural catheters either due to the formation of a fistulous  track or a dural tear.       8. Nerve damage: By working so close to the spinal cord, there is always a possibility of  nerve damage, which could be as serious as a permanent spinal cord injury with  paralysis.       9. Death:   Although rare, severe deadly allergic reactions known as "Anaphylactic  reaction" can occur to any of the medications used.      10. Worsening of the symptoms:  We can always make thing worse.  What are the chances of something like this happening? Chances of any of this occuring are extremely low.  By statistics, you have more of a chance of getting killed in a motor vehicle accident: while driving to the hospital than any of the above occurring .  Nevertheless, you should be aware that they are possibilities.  In general, it is similar to taking a shower.  Everybody knows that you can slip, hit your head and get killed.  Does that mean that you should not shower again?  Nevertheless always keep in mind that statistics do not mean anything if you happen to be on the wrong side of them.  Even if a procedure has a 1 (one) in a 1,000,000 (million) chance of going wrong, it you happen to be that one..Also, keep in mind that by statistics, you have more of a chance of having something go wrong when taking medications.  Who should not have this procedure? If you are on a blood thinning medication (e.g. Coumadin, Plavix, see list of "Blood Thinners"), or if you have an active infection going on, you should not have the procedure.  If you are taking any blood thinners, please inform your physician.  How should I prepare for this procedure?  Do not eat or drink anything at least six hours prior to the procedure.  Bring a driver with you .  It cannot be a taxi.  Come accompanied by an adult that can drive you back, and that is strong enough to help you if your legs get weak or numb from the local anesthetic.  Take all of your medicines the morning of the procedure with just enough water to swallow them.  If you have diabetes, make sure that you are scheduled to have your procedure done first thing in the morning, whenever possible.  If you have diabetes, take only half of your insulin dose and notify our  nurse that you have done so as soon as you arrive at the clinic.  If you are diabetic, but only take blood sugar pills (oral hypoglycemic), then do not take them on the morning of your procedure.  You may take them after you have had the procedure.  Do not take aspirin or any aspirin-containing medications, at least eleven (11) days prior to the procedure.  They may prolong bleeding.  Wear loose fitting clothing that may be easy to take off and that you would not mind if it got stained with Betadine or blood.  Do not wear any jewelry or perfume  Remove any nail coloring.  It will interfere with some of our monitoring equipment.  NOTE: Remember that this is not meant to be interpreted as a complete list of all possible complications.  Unforeseen problems may occur.  BLOOD THINNERS  The following drugs contain aspirin or other products, which can cause increased bleeding during surgery and should not be taken for 2 weeks prior to and 1 week after surgery.  If you should need take something for relief of minor pain, you may take acetaminophen which is found in Tylenol,m Datril, Anacin-3 and Panadol. It is not blood thinner. The products listed below are.  Do not take any of the products listed below in addition to any listed on your instruction sheet.  A.P.Amy or A.P.Amy with Codeine Codeine Phosphate Capsules #3 Ibuprofen Ridaura  ABC compound Congesprin Imuran rimadil  Advil Cope Indocin Robaxisal  Alka-Seltzer Effervescent Pain Reliever and Antacid Coricidin or Coricidin-D  Indomethacin Rufen  Alka-Seltzer plus Cold Medicine Cosprin Ketoprofen S-A-Amy Tablets  Anacin Analgesic Tablets or Capsules Coumadin Korlgesic Salflex  Anacin Extra Strength Analgesic tablets or capsules CP-2 Tablets Lanoril Salicylate  Anaprox Cuprimine Capsules Levenox Salocol  Anexsia-D Dalteparin Magan Salsalate  Anodynos Darvon compound Magnesium Salicylate Sine-off  Ansaid Dasin Capsules Magsal Sodium Salicylate   Anturane Depen Capsules Marnal Soma  APF Arthritis pain formula Dewitt's Pills Measurin Stanback  Argesic Dia-Gesic Meclofenamic Sulfinpyrazone  Arthritis Bayer Timed Release Aspirin Diclofenac Meclomen Sulindac  Arthritis pain formula Anacin Dicumarol Medipren Supac  Analgesic (Safety coated) Arthralgen Diffunasal Mefanamic Suprofen  Arthritis Strength Bufferin Dihydrocodeine Mepro Compound Suprol  Arthropan liquid Dopirydamole Methcarbomol with Aspirin Synalgos  ASA tablets/Enseals Disalcid Micrainin Tagament  Ascriptin Doan's Midol Talwin  Ascriptin A/D Dolene Mobidin Tanderil  Ascriptin Extra Strength Dolobid Moblgesic Ticlid  Ascriptin with Codeine Doloprin or Doloprin with Codeine Momentum Tolectin  Asperbuf Duoprin Mono-gesic Trendar  Aspergum Duradyne Motrin or Motrin IB Triminicin  Aspirin plain, buffered or enteric coated Durasal Myochrisine Trigesic  Aspirin Suppositories Easprin Nalfon Trillsate  Aspirin with Codeine Ecotrin Regular or Extra Strength Naprosyn Uracel  Atromid-S Efficin Naproxen Ursinus  Auranofin Capsules Elmiron Neocylate Vanquish  Axotal Emagrin Norgesic Verin  Azathioprine Empirin or Empirin with Codeine Normiflo Vitamin E  Azolid Emprazil Nuprin Voltaren  Bayer Aspirin plain, buffered or children's or timed BC Tablets or powders Encaprin Orgaran Warfarin Sodium  Buff-a-Comp Enoxaparin Orudis Zorpin  Buff-a-Comp with Codeine Equegesic Os-Cal-Gesic   Buffaprin Excedrin plain, buffered or Extra Strength Oxalid   Bufferin Arthritis Strength Feldene Oxphenbutazone   Bufferin plain or Extra Strength Feldene Capsules Oxycodone with Aspirin   Bufferin with Codeine Fenoprofen Fenoprofen Pabalate or Pabalate-SF   Buffets II Flogesic Panagesic   Buffinol plain or Extra Strength Florinal or Florinal with Codeine Panwarfarin   Buf-Tabs Flurbiprofen Penicillamine   Butalbital Compound Four-way cold tablets Penicillin   Butazolidin Fragmin Pepto-Bismol    Carbenicillin Geminisyn Percodan   Carna Arthritis Reliever Geopen Persantine   Carprofen Gold's salt Persistin   Chloramphenicol Goody's Phenylbutazone   Chloromycetin Haltrain Piroxlcam   Clmetidine heparin Plaquenil   Cllnoril Hyco-pap Ponstel   Clofibrate Hydroxy chloroquine Propoxyphen         Before stopping any of these medications, be sure to consult the physician who ordered them.  Some, such as Coumadin (Warfarin) are ordered to prevent or treat serious conditions such as "deep thrombosis", "pumonary embolisms", and other heart problems.  The amount of time that you may need off of the medication may also vary with the medication and the reason for which you were taking it.  If you are taking any of these medications, please make sure you notify your pain physician before you undergo any procedures.         Lumbar Sympathetic Block  Patient Information  Description: The lumbar plexus is a group of nerves that are part of the sympathetic nervous system.  These nerves supply organs in the pelvis and legs.  Lumbar sympathetic blocks are utilized for the diagnosis and treatment of painful conditions in these areas.   The lumbar plexus is located on both sides of the aorta at approximately the level of the second lumbar vertebral body.  The block will be performed with you lying on your abdomen with a pillow underneath.  Using direct x-ray guidance,   The plexus will be located on both sides of the spine.  Numbing medicine will be used to deaden the skin prior to needle insertion.  In most cases, a small amount of sedation can be give by IV prior to the numbing medicine.  One or two small needles will be placed near the plexus and local anesthetic will be injected.  This may make your leg(s) feel warm.  The Entire block usually lasts about 15-25 minutes.  Conditions which may be treated by lumbar sympathetic block:   Reflex sympathetic dystrophy  Phantom limb pain  Peripheral  neuropathy  Peripheral vascular disease ( inadequate blood flow )  Cancer pain of pelvis, leg and kidney  Preparation for the injection:  1. Do note eat any solid food or diary products within 8 hours of your appointment. 2. You may drink clear liquids up to 3 hours before appointment.  Clear liquids include water, black coffee, juice or soda.  No milk or cream please. 3. You may take your regular medication, including pain medications, with a sip of water before you appointment.  Diabetics should hold regular insulin ( if taken separately ) and take 1/2 NPH dose the morning of the procedure .  Carry some sugar containing items with you to your appointment. 4. A driver must accompany you and be prepared to drive you home after your procedure. 5. Bring all your current medication with you. 6. An IV may be inserted and sedation may be given at the discretion of the physician.  7. A blood pressure cuff, EKG and other monitors will often be applied during the procedure.  Some patients may need to have extra oxygen administered for a short period. 8. You will be asked to provide medical information, including your allergies and medications, prior to the procedure.  We must know immediately if your taking blood thinners (like Coumadin/Warfarin) or if you are allergic to IV iodine contrast (dye).  We must know if you could possibly be pregnant.  Possible side-effects   Bleeding from needle site or deeper  Infection (rare, can require surgery)  Nerve injury (rare)  Numbness & tingling (temporary)  Collapsed lung (rare)  Spinal headache (a headache worse with upright posture)  Light-headedness (temporary)  Pain at injection site (several days)  Decreased blood pressure (temporary)  Weakness in legs (temporary)  Seizure or other drug reaction (rare)  Call if you experience:   Fever/chills associated with headache or increased back/ neck pain  Headache worsened by an upright  position  New onset weakness or numbness of an extremity below the injection site  Hives or difficulty breathing ( go to the emergency room)  Inflammation or drainage at the injections site(s)  New symptoms which are concerning to you  Please note:  If effective, we will often do a series of 2-3 injections spaced 3-6 weeks apart to maximally decrease your pain.  If initial series is effective, you may be a candidate  for a more permanent block of the lumbar sympathetic plexus.  If you have any questions please call [phone] [facility]

## 2015-07-11 NOTE — Progress Notes (Signed)
Safety precautions to be maintained throughout the outpatient stay will include: orient to surroundings, keep bed in low position, maintain call bell within reach at all times, provide assistance with transfer out of bed and ambulation.  

## 2015-07-11 NOTE — Progress Notes (Signed)
Subjective:    Patient ID: Amy Rocha, female    DOB: 1955/07/08, 60 y.o.   MRN: [id number]  HPI  The patient is a 60 year old female who returns to pain management for further evaluation and treatment of pain involving the region of the mid lower back and lower extremity region. The patient has had improvement of her pain with interventional treatment performed in pain management Center. The patient is status post total hip replacements due to avascular necrosis of the hips. The patient has complained of cold sensation of the lower extremities and there has been concern regarding vascular condition contributing to patient's symptomatology. We will proceed with lumbar synthetic block to be performed at time return appointment in attempt to improve blood flow the lower extremities in an attempt to decrease the cold sensation of the lower extremities which is severely incapacitating involved some to the patient. The patient will continue medications as prescribed and we will consider patient for modification of treatment pending response to treatment vascular evaluation follow-up evaluation in pain medicine Center. All agreed to suggested treatment plan  Review of Systems     Objective:   Physical Exam  There was tenderness of the splenius capitis and occipitalis musculature region a mild degree with mild tenderness of the cervical facet cervical paraspinal musculature. There was mild tenderness of the acromioclavicular and glenohumeral joint region and patient appeared to be unremarkable Spurling's maneuver. The patient appeared to be with bilaterally equal grip strength with Tinel and Phalen's maneuver reproducing minimal discomfort. Palpation over the thoracic facet thoracic paraspinal must reason was with minimal tenderness to palpation and patient was with mild tenderness of the thoracic facet thoracic paraspinal musculature region with no crepitus of the thoracic region noted. There was  tenderness over the lumbar paraspinal misreading lumbar facet region of moderate degree with lateral bending rotation extension and palpation of the lumbar facets reproducing moderate discomfort. Palpation over the PSIS and PII S region reproduced mild to moderate discomfort. There was mild to moderate tenderness along the iliotibial band region. Straight leg raise was tolerates approximately 20 with no increased pain with dorsiflexion noted. EHL strength appeared to be slightly decreased. No sensory deficit or dermatomal distribution detected. Extremities were tenderness to palpation with slightly decreased step of the lower extremities noted. No lesions of the lower extremity were noted. There was no definite sensory deficit of the lower extremities of dermatomal distribution. And there was negative clonus negative Homans. Abdomen was nontender with no costovertebral tenderness noted.      Assessment & Plan:   Carpal tunnel syndrome  Complex regional pain syndrome of lower extremities versus vascular compromise  Degenerative disc disease lumbar spine Convex left scoliosis. Degenerative disc disease with facet hypertrophy at multiple levels involving L4-5 and L5-S1 predominantly. Mild disc bulging at L1-2 L4-5.  Lumbar facet syndrome  Sacroiliac joint dysfunction  Status post bilateral total hip replacement  Avascular necrosis of hips  Status post spinal cord stimulator placement complicated by infection requiring explantation of spinal cord stimulator and healing by secondary intention       PLAN   Continue present medications Cymbalta Neurontin Voltaren gel oxycodone acetaminophen oxymorphone extended release     Lumbar sympathetic block to be performed at time return appointment  F/U PCP C Wicker for evaliation of  BP and general medical  condition as discussed.  F/U surgical evaluation. May consider pending follow-up evaluations  Ask the nurses and secretary the date of  your vascular evaluation as discussed  for further evaluation of lower extremity pain  F/U neurological evaluation. May conside PNCV/EMG studies and other studies r pending follow-up evaluations  May consider radiofrequency rhizolysis or intraspinal procedures pending response to present treatment and F/U evaluation   Patient to call Pain Management Center should patient have concerns prior to scheduled return appointment

## 2015-07-13 ENCOUNTER — Ambulatory Visit: Payer: Medicare Other | Admitting: Family Medicine

## 2015-07-19 ENCOUNTER — Ambulatory Visit: Payer: Medicare Other | Admitting: Family Medicine

## 2015-07-31 ENCOUNTER — Encounter: Payer: Self-pay | Admitting: Pain Medicine

## 2015-07-31 ENCOUNTER — Ambulatory Visit: Payer: Medicare Other | Admitting: Pain Medicine

## 2015-07-31 ENCOUNTER — Ambulatory Visit: Payer: Medicare Other | Attending: Pain Medicine | Admitting: Pain Medicine

## 2015-07-31 VITALS — BP 113/95 | HR 75 | Temp 97.8°F | Resp 22 | Ht 64.0 in | Wt 160.0 lb

## 2015-07-31 DIAGNOSIS — I739 Peripheral vascular disease, unspecified: Secondary | ICD-10-CM | POA: Diagnosis not present

## 2015-07-31 DIAGNOSIS — Z96649 Presence of unspecified artificial hip joint: Secondary | ICD-10-CM | POA: Diagnosis not present

## 2015-07-31 DIAGNOSIS — M5136 Other intervertebral disc degeneration, lumbar region: Secondary | ICD-10-CM

## 2015-07-31 DIAGNOSIS — G90523 Complex regional pain syndrome I of lower limb, bilateral: Secondary | ICD-10-CM

## 2015-07-31 DIAGNOSIS — M79604 Pain in right leg: Secondary | ICD-10-CM | POA: Insufficient documentation

## 2015-07-31 DIAGNOSIS — M47817 Spondylosis without myelopathy or radiculopathy, lumbosacral region: Secondary | ICD-10-CM | POA: Diagnosis not present

## 2015-07-31 DIAGNOSIS — M879 Osteonecrosis, unspecified: Secondary | ICD-10-CM | POA: Insufficient documentation

## 2015-07-31 DIAGNOSIS — M87051 Idiopathic aseptic necrosis of right femur: Secondary | ICD-10-CM

## 2015-07-31 DIAGNOSIS — I70209 Unspecified atherosclerosis of native arteries of extremities, unspecified extremity: Secondary | ICD-10-CM

## 2015-07-31 DIAGNOSIS — M87052 Idiopathic aseptic necrosis of left femur: Secondary | ICD-10-CM

## 2015-07-31 DIAGNOSIS — Z96643 Presence of artificial hip joint, bilateral: Secondary | ICD-10-CM

## 2015-07-31 DIAGNOSIS — M5481 Occipital neuralgia: Secondary | ICD-10-CM

## 2015-07-31 DIAGNOSIS — M47816 Spondylosis without myelopathy or radiculopathy, lumbar region: Secondary | ICD-10-CM

## 2015-07-31 DIAGNOSIS — M79605 Pain in left leg: Secondary | ICD-10-CM | POA: Diagnosis present

## 2015-07-31 MED ORDER — BUPIVACAINE HCL (PF) 0.25 % IJ SOLN
30.0000 mL | Freq: Once | INTRAMUSCULAR | Status: AC
  Administered 2015-07-31: 30 mL
  Filled 2015-07-31: qty 30

## 2015-07-31 MED ORDER — FENTANYL CITRATE (PF) 100 MCG/2ML IJ SOLN
100.0000 ug | Freq: Once | INTRAMUSCULAR | Status: AC
  Administered 2015-07-31: 100 ug via INTRAVENOUS
  Filled 2015-07-31: qty 2

## 2015-07-31 MED ORDER — CIPROFLOXACIN IN D5W 400 MG/200ML IV SOLN
400.0000 mg | Freq: Once | INTRAVENOUS | Status: AC
  Administered 2015-07-31: 400 mg via INTRAVENOUS
  Filled 2015-07-31: qty 200

## 2015-07-31 MED ORDER — LIDOCAINE HCL (PF) 1 % IJ SOLN
10.0000 mL | Freq: Once | INTRAMUSCULAR | Status: AC
  Administered 2015-07-31: 10 mL via SUBCUTANEOUS
  Filled 2015-07-31: qty 10

## 2015-07-31 MED ORDER — ORPHENADRINE CITRATE 30 MG/ML IJ SOLN
60.0000 mg | Freq: Once | INTRAMUSCULAR | Status: AC
  Administered 2015-07-31: 60 mg via INTRAMUSCULAR
  Filled 2015-07-31: qty 2

## 2015-07-31 MED ORDER — TRIAMCINOLONE ACETONIDE 40 MG/ML IJ SUSP
40.0000 mg | Freq: Once | INTRAMUSCULAR | Status: AC
  Administered 2015-07-31: 40 mg
  Filled 2015-07-31: qty 1

## 2015-07-31 MED ORDER — LACTATED RINGERS IV SOLN: 1000.0000 mL | INTRAVENOUS | Status: DC

## 2015-07-31 MED ORDER — CIPROFLOXACIN HCL 250 MG PO TABS: 250.0000 mg | ORAL_TABLET | Freq: Two times a day (BID) | ORAL | Status: DC

## 2015-07-31 MED ORDER — MIDAZOLAM HCL 5 MG/5ML IJ SOLN
5.0000 mg | Freq: Once | INTRAMUSCULAR | Status: AC
  Administered 2015-07-31: 5 mg via INTRAVENOUS
  Filled 2015-07-31: qty 5

## 2015-07-31 NOTE — Progress Notes (Signed)
Patient here for medication management d/t lower back pain. Safety precautions to be maintained throughout the outpatient stay will include: orient to surroundings, keep bed in low position, maintain call bell within reach at all times, provide assistance with transfer out of bed and ambulation.

## 2015-07-31 NOTE — Progress Notes (Signed)
Subjective:    Patient ID: Amy Rocha, female    DOB: 07/13/1955, 60 y.o.   MRN: [id number]  HPI  PROCEDURE PERFORMED: Lumbar sympathetic block.  HISTORY OF PRESENT ILLNESS: The patient is 60 y.o. female who returns to [place] [facility] for further evaluation and treatment of pain involving the lower extremities. The patient is with history ofAvascular necrosis of hips with total hip replacement as well as peripheral vascular disease with pain described as cold sensation of the lower extremities There is concern regarding the patient's pain being due to peripheral vascular disease of the lower extremities . The risks, benefits, and expectations of the procedure were discussed and explained to the patient who was understanding and wished to proceed with interventional treatment as planned.   DESCRIPTION OF PROCEDURE: Lumbar sympathetic block with IV Versed, IV fentanyl conscious sedation, EKG, blood pressure, pulse, capnography, and pulse oximetry monitoring. The procedure was performed with the patient in prone position under fluoroscopic guidance.   NEEDLE PLACEMENT AT L2, right side lumbar sympathetic block: With the patient in prone position and oblique orientation of 20 degrees, Betadine prep and local anesthetic skin wheal of 1.5% lidocaine plain was prepared at the proposed needle entry site. Under fluoroscopic guidance with 20 degrees oblique orientation, the 22 -gauge needle was inserted at the lateral border of the L2 vertebral body on the right side.   NEEDLE PLACEMENT AT L3, right side lumbar sympathetic block: With the patient in the prone position and oblique orientation of 20 degrees, Betadine prep and local anesthetic skin wheal of 1.5% lidocaine plain was prepared at the proposed needle entry site. Under fluoroscopic guidance with 20 degrees  oblique orientation, the 22 -gauge needle was inserted at the lateral border of the L3 vertebral body on the right side.   NEEDLE  PLACEMENT AT L4, right side lumbar sympathetic block: With the patient in prone position and oblique orientation of 20 degrees, Betadine prep and local anesthetic skin wheal of 1.5% lidocaine plain was prepared at the proposed needle entry site. Under fluoroscopic guidance with 20 degrees  oblique orientation, the 22 -gauge needle was inserted at the lateral border of the L4 vertebral body on the right side.    Following needle placement at the L2, L3 and L4 vertebral body levels on the right side, needle placement was then verified on lateral view with tip of the needle documented to be in the anterior third of the vertebral body of L2, L3 and L4 respectively. Following negative aspiration of each needle for heme and CSF, L2 vertebral body level needle was injected with 10 mL of 0.25% bupivacaine. L3 vertebral body level needle was injected with 10 mL of 0.25% bupivacaine. L4 vertebral body level was injected with 10 mL of 0.25% bupivacaine. Needles were removed. Please note temperature readings prior to lumbar sympathetic block were noted to be 92.8 degrees Fahrenheit and following completion of the lumbar sympathetic block temperature readings of the lower extremity were noted to be 88.5 degrees Fahrenheit. The patient tolerated the procedure well.  PLAN:   1. Medications: We will continue presently prescribed medications Cymbalta Opana ER and oxycodone at this time. 2. The patient is to follow-up with primary care physician C Wicker for further evaluation of blood pressure and general medical condition as discussed. 3. Surgical evaluation as discussed. 4. Neurological evaluation as discussed. 5. The patient may be candidate for radiofrequency procedures, implantation devices, and other treatment pending response to treatment and follow-up evaluation. 6. The  patient has been advised to adhere to proper body mechanics. 7. The patient has been advised to call the [facility] Management Center prior to  scheduled return appointment should there be significant change in condition or have other concerns regarding condition prior to scheduled return appointment.   The patient was understanding and in agreement with suggested treatment plan.   Review of Systems     Objective:   Physical Exam        Assessment & Plan:

## 2015-07-31 NOTE — Patient Instructions (Addendum)
PLAN cipro antibiotic at your pharmacy  Continue present medications Cymbalta Voltaren gel oxycodone acetaminophen oxymorphone extended release Please obtain Cipro antibiotic today and begin taking Cipro antibiotic as prescribed .  F/U PCP C Wicker for evaliation of  BP and general medical  condition as discussed.   F/U surgical evaluation. May consider pending follow-up evaluations  F/U neurological evaluation. May consider further evaluation of headache with neurological consultation pending response to procedure performed today and follow-up evaluations  May consider radiofrequency rhizolysis or intraspinal procedures pending response to present treatment and F/U evaluation   Patient to call Pain Management Center should patient have concerns prior to scheduled return appointmentPain Management Discharge Instructions  General Discharge Instructions :  If you need to reach your doctor call: Monday-Friday 8:00 am - 4:00 pm at [phone] or toll free [phone].  After clinic hours [phone] to have operator reach doctor.  Bring all of your medication bottles to all your appointments in the pain clinic.  To cancel or reschedule your appointment with Pain Management please remember to call 24 hours in advance to avoid a fee.  Refer to the educational materials which you have been given on: General Risks, I had my Procedure. Discharge Instructions, Post Sedation.  Post Procedure Instructions:  The drugs you were given will stay in your system until tomorrow, so for the next 24 hours you should not drive, make any legal decisions or drink any alcoholic beverages.  You may eat anything you prefer, but it is better to start with liquids then soups and crackers, and gradually work up to solid foods.  Please notify your doctor immediately if you have any unusual bleeding, trouble breathing or pain that is not related to your normal pain.  Depending on the type of procedure that was  done, some parts of your body may feel week and/or numb.  This usually clears up by tonight or the next day.  Walk with the use of an assistive device or accompanied by an adult for the 24 hours.  You may use ice on the affected area for the first 24 hours.  Put ice in a Ziploc bag and cover with a towel and place against area 15 minutes on 15 minutes off.  You may switch to heat after 24 hours.

## 2015-08-01 ENCOUNTER — Telehealth: Payer: Self-pay | Admitting: *Deleted

## 2015-08-01 ENCOUNTER — Telehealth: Payer: Self-pay | Admitting: Pain Medicine

## 2015-08-01 NOTE — Telephone Encounter (Signed)
No answer times two attempted calls. No way to leave voicemail.

## 2015-08-01 NOTE — Telephone Encounter (Signed)
Patient is feeling nauseated after starting her antibiotics, could dr crisp call in something for nausea ? Please let her know

## 2015-08-01 NOTE — Telephone Encounter (Signed)
Spoke with Dr. Primus Bravo, tell patient she may stop the antibiotic. Attempted to call patient, no answer.

## 2015-08-02 ENCOUNTER — Telehealth: Payer: Self-pay | Admitting: *Deleted

## 2015-08-08 ENCOUNTER — Ambulatory Visit: Payer: Medicare Other | Attending: Pain Medicine | Admitting: Pain Medicine

## 2015-08-08 ENCOUNTER — Encounter: Payer: Self-pay | Admitting: Pain Medicine

## 2015-08-08 VITALS — BP 123/97 | HR 101 | Temp 98.0°F | Resp 16

## 2015-08-08 DIAGNOSIS — G90523 Complex regional pain syndrome I of lower limb, bilateral: Secondary | ICD-10-CM

## 2015-08-08 DIAGNOSIS — M47817 Spondylosis without myelopathy or radiculopathy, lumbosacral region: Secondary | ICD-10-CM | POA: Diagnosis not present

## 2015-08-08 DIAGNOSIS — M5126 Other intervertebral disc displacement, lumbar region: Secondary | ICD-10-CM | POA: Diagnosis not present

## 2015-08-08 DIAGNOSIS — M87852 Other osteonecrosis, left femur: Secondary | ICD-10-CM | POA: Insufficient documentation

## 2015-08-08 DIAGNOSIS — I70209 Unspecified atherosclerosis of native arteries of extremities, unspecified extremity: Secondary | ICD-10-CM

## 2015-08-08 DIAGNOSIS — M533 Sacrococcygeal disorders, not elsewhere classified: Secondary | ICD-10-CM | POA: Diagnosis not present

## 2015-08-08 DIAGNOSIS — R202 Paresthesia of skin: Secondary | ICD-10-CM | POA: Insufficient documentation

## 2015-08-08 DIAGNOSIS — M791 Myalgia: Secondary | ICD-10-CM | POA: Diagnosis not present

## 2015-08-08 DIAGNOSIS — M87051 Idiopathic aseptic necrosis of right femur: Secondary | ICD-10-CM

## 2015-08-08 DIAGNOSIS — M87052 Idiopathic aseptic necrosis of left femur: Secondary | ICD-10-CM

## 2015-08-08 DIAGNOSIS — M5136 Other intervertebral disc degeneration, lumbar region: Secondary | ICD-10-CM | POA: Insufficient documentation

## 2015-08-08 DIAGNOSIS — Z96643 Presence of artificial hip joint, bilateral: Secondary | ICD-10-CM | POA: Insufficient documentation

## 2015-08-08 DIAGNOSIS — M4186 Other forms of scoliosis, lumbar region: Secondary | ICD-10-CM | POA: Diagnosis not present

## 2015-08-08 DIAGNOSIS — M545 Low back pain: Secondary | ICD-10-CM | POA: Diagnosis present

## 2015-08-08 DIAGNOSIS — M47816 Spondylosis without myelopathy or radiculopathy, lumbar region: Secondary | ICD-10-CM

## 2015-08-08 DIAGNOSIS — M5481 Occipital neuralgia: Secondary | ICD-10-CM

## 2015-08-08 DIAGNOSIS — M461 Sacroiliitis, not elsewhere classified: Secondary | ICD-10-CM | POA: Diagnosis not present

## 2015-08-08 DIAGNOSIS — M87851 Other osteonecrosis, right femur: Secondary | ICD-10-CM | POA: Insufficient documentation

## 2015-08-08 DIAGNOSIS — M5416 Radiculopathy, lumbar region: Secondary | ICD-10-CM | POA: Diagnosis not present

## 2015-08-08 MED ORDER — OXYCODONE-ACETAMINOPHEN 10-325 MG PO TABS: ORAL_TABLET | ORAL | Status: DC

## 2015-08-08 MED ORDER — OXYMORPHONE HCL ER 20 MG PO T12A: EXTENDED_RELEASE_TABLET | ORAL | Status: DC

## 2015-08-08 NOTE — Progress Notes (Signed)
Subjective:    Patient ID: Amy Rocha, female    DOB: 07-Feb-1956, 60 y.o.   MRN: [id number]  HPI  The patient is a 60 year old female who returns to pain management for further evaluation and treatment of pain involving the region of the lower back and lower extremity regions predominantly the patient has a history of avascular necrosis of the hips and is status post total hip replacement. The patient has had sensation of cold of the lower extremities. And there is concern regarding component of peripheral vascular disease contributing to patient's lower extremity pain and paresthesias. The patient has had greater than 75% relief of her paresthesias of the right lower extremity and wishes to undergo lumbar sympathetic block of the left lower extremity in attempt to decrease severity of symptoms, minimize progression of symptoms, and avoid the need for more involved treatment. The patient will undergo further vascular evaluation as discussed and we will proceed with lumbar sympathetic block at time return appointment as discussed. The patient will continue medications consisting of Cymbalta, Neurontin oxycodone acetaminophen and oxymorphone at this time. We will proceed with lumbar sympathetic block at time of return appointment as planned. All agreed to suggested treatment plan  Review of Systems     Objective:   Physical Exam  . There was tenderness of the splenius capitis and occipitalis region palpation which reproduces minimal discomfort. There was minimal tenderness of the cervical facet cervical paraspinal musculature region. Palpation over the region of the acromioclavicular and glenohumeral joint regions reproduces minimal discomfort. The patient appeared to be with bilaterally equal grip strength and Tinel and Phalen's maneuver were without increase of pain of significant degree. Palpation over the region of the thoracic region thoracic facet region was attends to palpation of moderate  degree with no crepitus of the thoracic region noted. The patient appeared to be with bilaterally equal grip strength and Tinel and Phalen's maneuver were without increase of pain of significant degree. The patient appeared to be with unremarkable Spurling's maneuver. Palpation over the lumbar paraspinal musculatures and lumbar facet region was with tenderness to palpation of mild to moderate degree with mild to moderate tenderness along the greater trochanteric region iliotibial band region and moderate tenderness over the PSIS and PII S region. Straight leg raise was tolerates approximately 20 without increase of pain with dorsiflexion noted. Pulses were palpable of the lower extremity region there was no increased warmth and erythema in the lower extremities noted. There was questionably decreased temperature of the lower extremity noted. No allodynia of the lower extremity was noted. There was negative clonus negative Homans. Abdomen nontender with no costovertebral tenderness noted      Assessment & Plan:     Complex regional pain syndrome of lower extremities versus vascular compromise  Degenerative disc disease lumbar spine Convex left scoliosis. Degenerative disc disease with facet hypertrophy at multiple levels involving L4-5 and L5-S1 predominantly. Mild disc bulging at L1-2 L4-5.  Lumbar facet syndrome  Sacroiliac joint dysfunction  Status post bilateral total hip replacement  Carpal syndrome      PLAN   Continue present medications Cymbalta Neurontin Voltaren gel oxycodone acetaminophen oxymorphone extended release   Lumbar synthetic block to be performed at time of return appointment    F/U PCP C Wicker for evaliation of  BP and general medical  condition as discussed.  F/U surgical evaluation. May consider pending follow-up evaluations  Ask the nurses and secretary the date of your vascular evaluation as discussed for further  evaluation of lower extremity  pain  F/U neurological evaluation. May conside PNCV/EMG studies and other studies r pending follow-up evaluations  May consider radiofrequency rhizolysis or intraspinal procedures pending response to present treatment and F/U evaluation   Patient to call Pain Management Center should patient have concerns prior to scheduled return appointment

## 2015-08-08 NOTE — Progress Notes (Signed)
Safety precautions to be maintained throughout the outpatient stay will include: orient to surroundings, keep bed in low position, maintain call bell within reach at all times, provide assistance with transfer out of bed and ambulation.  

## 2015-08-08 NOTE — Patient Instructions (Addendum)
PLAN   Continue present medications Cymbalta Neurontin Voltaren gel oxycodone acetaminophen oxymorphone extended release   Lumbar synthetic block to be performed at time of return appointment    F/U PCP C Wicker for evaliation of  BP and general medical  condition as discussed.  F/U surgical evaluation. May consider pending follow-up evaluations  Ask the nurses and secretary the date of your vascular evaluation as discussed for further evaluation of lower extremity pain  F/U neurological evaluation. May conside PNCV/EMG studies and other studies r pending follow-up evaluations  May consider radiofrequency rhizolysis or intraspinal procedures pending response to present treatment and F/U evaluation   Patient to call [facility] should patient have concerns prior to scheduled return appointmentGENERAL RISKS AND COMPLICATIONS  What are the risk, side effects and possible complications? Generally speaking, most procedures are safe.  However, with any procedure there are risks, side effects, and the possibility of complications.  The risks and complications are dependent upon the sites that are lesioned, or the type of nerve block to be performed.  The closer the procedure is to the spine, the more serious the risks are.  Great care is taken when placing the radio frequency needles, block needles or lesioning probes, but sometimes complications can occur. 1. Infection: Any time there is an injection through the skin, there is a risk of infection.  This is why sterile conditions are used for these blocks.  There are four possible types of infection. 1. Localized skin infection. 2. Central Nervous System Infection-This can be in the form of Meningitis, which can be deadly. 3. Epidural Infections-This can be in the form of an epidural abscess, which can cause pressure inside of the spine, causing compression of the spinal cord with subsequent paralysis. This would require an emergency  surgery to decompress, and there are no guarantees that the patient would recover from the paralysis. 4. Discitis-This is an infection of the intervertebral discs.  It occurs in about 1% of discography procedures.  It is difficult to treat and it may lead to surgery.        2. Pain: the needles have to go through skin and soft tissues, will cause soreness.       3. Damage to internal structures:  The nerves to be lesioned may be near blood vessels or    other nerves which can be potentially damaged.       4. Bleeding: Bleeding is more common if the patient is taking blood thinners such as  aspirin, Coumadin, Ticiid, Plavix, etc., or if he/she have some genetic predisposition  such as hemophilia. Bleeding into the spinal canal can cause compression of the spinal  cord with subsequent paralysis.  This would require an emergency surgery to  decompress and there are no guarantees that the patient would recover from the  paralysis.       5. Pneumothorax:  Puncturing of a lung is a possibility, every time a needle is introduced in  the area of the chest or upper back.  Pneumothorax refers to free air around the  collapsed lung(s), inside of the thoracic cavity (chest cavity).  Another two possible  complications related to a similar event would include: Hemothorax and Chylothorax.   These are variations of the Pneumothorax, where instead of air around the collapsed  lung(s), you may have blood or chyle, respectively.       6. Spinal headaches: They may occur with any procedures in the area of the spine.  7. Persistent CSF (Cerebro-Spinal Fluid) leakage: This is a rare problem, but may occur  with prolonged intrathecal or epidural catheters either due to the formation of a fistulous  track or a dural tear.       8. Nerve damage: By working so close to the spinal cord, there is always a possibility of  nerve damage, which could be as serious as a permanent spinal cord injury with  paralysis.       9. Death:   Although rare, severe deadly allergic reactions known as "Anaphylactic  reaction" can occur to any of the medications used.      10. Worsening of the symptoms:  We can always make thing worse.  What are the chances of something like this happening? Chances of any of this occuring are extremely low.  By statistics, you have more of a chance of getting killed in a motor vehicle accident: while driving to the hospital than any of the above occurring .  Nevertheless, you should be aware that they are possibilities.  In general, it is similar to taking a shower.  Everybody knows that you can slip, hit your head and get killed.  Does that mean that you should not shower again?  Nevertheless always keep in mind that statistics do not mean anything if you happen to be on the wrong side of them.  Even if a procedure has a 1 (one) in a 1,000,000 (million) chance of going wrong, it you happen to be that one..Also, keep in mind that by statistics, you have more of a chance of having something go wrong when taking medications.  Who should not have this procedure? If you are on a blood thinning medication (e.g. Coumadin, Plavix, see list of "Blood Thinners"), or if you have an active infection going on, you should not have the procedure.  If you are taking any blood thinners, please inform your physician.  How should I prepare for this procedure?  Do not eat or drink anything at least six hours prior to the procedure.  Bring a driver with you .  It cannot be a taxi.  Come accompanied by an adult that can drive you back, and that is strong enough to help you if your legs get weak or numb from the local anesthetic.  Take all of your medicines the morning of the procedure with just enough water to swallow them.  If you have diabetes, make sure that you are scheduled to have your procedure done first thing in the morning, whenever possible.  If you have diabetes, take only half of your insulin dose and notify our  nurse that you have done so as soon as you arrive at the clinic.  If you are diabetic, but only take blood sugar pills (oral hypoglycemic), then do not take them on the morning of your procedure.  You may take them after you have had the procedure.  Do not take aspirin or any aspirin-containing medications, at least eleven (11) days prior to the procedure.  They may prolong bleeding.  Wear loose fitting clothing that may be easy to take off and that you would not mind if it got stained with Betadine or blood.  Do not wear any jewelry or perfume  Remove any nail coloring.  It will interfere with some of our monitoring equipment.  NOTE: Remember that this is not meant to be interpreted as a complete list of all possible complications.  Unforeseen problems may occur.  BLOOD THINNERS  The following drugs contain aspirin or other products, which can cause increased bleeding during surgery and should not be taken for 2 weeks prior to and 1 week after surgery.  If you should need take something for relief of minor pain, you may take acetaminophen which is found in Tylenol,m Datril, Anacin-3 and Panadol. It is not blood thinner. The products listed below are.  Do not take any of the products listed below in addition to any listed on your instruction sheet.  A.P.C or A.P.C with Codeine Codeine Phosphate Capsules #3 Ibuprofen Ridaura  ABC compound Congesprin Imuran rimadil  Advil Cope Indocin Robaxisal  Alka-Seltzer Effervescent Pain Reliever and Antacid Coricidin or Coricidin-D  Indomethacin Rufen  Alka-Seltzer plus Cold Medicine Cosprin Ketoprofen S-A-C Tablets  Anacin Analgesic Tablets or Capsules Coumadin Korlgesic Salflex  Anacin Extra Strength Analgesic tablets or capsules CP-2 Tablets Lanoril Salicylate  Anaprox Cuprimine Capsules Levenox Salocol  Anexsia-D Dalteparin Magan Salsalate  Anodynos Darvon compound Magnesium Salicylate Sine-off  Ansaid Dasin Capsules Magsal Sodium Salicylate   Anturane Depen Capsules Marnal Soma  APF Arthritis pain formula Dewitt's Pills Measurin Stanback  Argesic Dia-Gesic Meclofenamic Sulfinpyrazone  Arthritis Bayer Timed Release Aspirin Diclofenac Meclomen Sulindac  Arthritis pain formula Anacin Dicumarol Medipren Supac  Analgesic (Safety coated) Arthralgen Diffunasal Mefanamic Suprofen  Arthritis Strength Bufferin Dihydrocodeine Mepro Compound Suprol  Arthropan liquid Dopirydamole Methcarbomol with Aspirin Synalgos  ASA tablets/Enseals Disalcid Micrainin Tagament  Ascriptin Doan's Midol Talwin  Ascriptin A/D Dolene Mobidin Tanderil  Ascriptin Extra Strength Dolobid Moblgesic Ticlid  Ascriptin with Codeine Doloprin or Doloprin with Codeine Momentum Tolectin  Asperbuf Duoprin Mono-gesic Trendar  Aspergum Duradyne Motrin or Motrin IB Triminicin  Aspirin plain, buffered or enteric coated Durasal Myochrisine Trigesic  Aspirin Suppositories Easprin Nalfon Trillsate  Aspirin with Codeine Ecotrin Regular or Extra Strength Naprosyn Uracel  Atromid-S Efficin Naproxen Ursinus  Auranofin Capsules Elmiron Neocylate Vanquish  Axotal Emagrin Norgesic Verin  Azathioprine Empirin or Empirin with Codeine Normiflo Vitamin E  Azolid Emprazil Nuprin Voltaren  Bayer Aspirin plain, buffered or children's or timed BC Tablets or powders Encaprin Orgaran Warfarin Sodium  Buff-a-Comp Enoxaparin Orudis Zorpin  Buff-a-Comp with Codeine Equegesic Os-Cal-Gesic   Buffaprin Excedrin plain, buffered or Extra Strength Oxalid   Bufferin Arthritis Strength Feldene Oxphenbutazone   Bufferin plain or Extra Strength Feldene Capsules Oxycodone with Aspirin   Bufferin with Codeine Fenoprofen Fenoprofen Pabalate or Pabalate-SF   Buffets II Flogesic Panagesic   Buffinol plain or Extra Strength Florinal or Florinal with Codeine Panwarfarin   Buf-Tabs Flurbiprofen Penicillamine   Butalbital Compound Four-way cold tablets Penicillin   Butazolidin Fragmin Pepto-Bismol    Carbenicillin Geminisyn Percodan   Carna Arthritis Reliever Geopen Persantine   Carprofen Gold's salt Persistin   Chloramphenicol Goody's Phenylbutazone   Chloromycetin Haltrain Piroxlcam   Clmetidine heparin Plaquenil   Cllnoril Hyco-pap Ponstel   Clofibrate Hydroxy chloroquine Propoxyphen         Before stopping any of these medications, be sure to consult the physician who ordered them.  Some, such as Coumadin (Warfarin) are ordered to prevent or treat serious conditions such as "deep thrombosis", "pumonary embolisms", and other heart problems.  The amount of time that you may need off of the medication may also vary with the medication and the reason for which you were taking it.  If you are taking any of these medications, please make sure you notify your pain physician before you undergo any procedures.         Lumbar Sympathetic Block  Patient Information  Description: The lumbar plexus is a group of nerves that are part of the sympathetic nervous system.  These nerves supply organs in the pelvis and legs.  Lumbar sympathetic blocks are utilized for the diagnosis and treatment of painful conditions in these areas.   The lumbar plexus is located on both sides of the aorta at approximately the level of the second lumbar vertebral body.  The block will be performed with you lying on your abdomen with a pillow underneath.  Using direct x-ray guidance,   The plexus will be located on both sides of the spine.  Numbing medicine will be used to deaden the skin prior to needle insertion.  In most cases, a small amount of sedation can be give by IV prior to the numbing medicine.  One or two small needles will be placed near the plexus and local anesthetic will be injected.  This may make your leg(s) feel warm.  The Entire block usually lasts about 15-25 minutes.  Conditions which may be treated by lumbar sympathetic block:   Reflex sympathetic dystrophy  Phantom limb pain  Peripheral  neuropathy  Peripheral vascular disease ( inadequate blood flow )  Cancer pain of pelvis, leg and kidney  Preparation for the injection:  1. Do note eat any solid food or diary products within 8 hours of your appointment. 2. You may drink clear liquids up to 3 hours before appointment.  Clear liquids include water, black coffee, juice or soda.  No milk or cream please. 3. You may take your regular medication, including pain medications, with a sip of water before you appointment.  Diabetics should hold regular insulin ( if taken separately ) and take 1/2 NPH dose the morning of the procedure .  Carry some sugar containing items with you to your appointment. 4. A driver must accompany you and be prepared to drive you home after your procedure. 5. Bring all your current medication with you. 6. An IV may be inserted and sedation may be given at the discretion of the physician.  7. A blood pressure cuff, EKG and other monitors will often be applied during the procedure.  Some patients may need to have extra oxygen administered for a short period. 8. You will be asked to provide medical information, including your allergies and medications, prior to the procedure.  We must know immediately if your taking blood thinners (like Coumadin/Warfarin) or if you are allergic to IV iodine contrast (dye).  We must know if you could possibly be pregnant.  Possible side-effects   Bleeding from needle site or deeper  Infection (rare, can require surgery)  Nerve injury (rare)  Numbness & tingling (temporary)  Collapsed lung (rare)  Spinal headache (a headache worse with upright posture)  Light-headedness (temporary)  Pain at injection site (several days)  Decreased blood pressure (temporary)  Weakness in legs (temporary)  Seizure or other drug reaction (rare)  Call if you experience:   Fever/chills associated with headache or increased back/ neck pain  Headache worsened by an upright  position  New onset weakness or numbness of an extremity below the injection site  Hives or difficulty breathing ( go to the emergency room)  Inflammation or drainage at the injections site(s)  New symptoms which are concerning to you  Please note:  If effective, we will often do a series of 2-3 injections spaced 3-6 weeks apart to maximally decrease your pain.  If initial series is effective, you may be a candidate  for a more permanent block of the lumbar sympathetic plexus.  If you have any questions please call [phone] [facility]

## 2015-08-14 ENCOUNTER — Ambulatory Visit: Payer: Medicare Other | Admitting: Pain Medicine

## 2015-08-16 ENCOUNTER — Ambulatory Visit: Payer: Medicare Other | Attending: Pain Medicine | Admitting: Pain Medicine

## 2015-08-16 ENCOUNTER — Encounter: Payer: Self-pay | Admitting: Pain Medicine

## 2015-08-16 VITALS — BP 122/87 | HR 95 | Temp 97.2°F | Resp 16 | Ht 65.0 in | Wt 160.0 lb

## 2015-08-16 DIAGNOSIS — M87851 Other osteonecrosis, right femur: Secondary | ICD-10-CM | POA: Diagnosis not present

## 2015-08-16 DIAGNOSIS — M79605 Pain in left leg: Secondary | ICD-10-CM | POA: Insufficient documentation

## 2015-08-16 DIAGNOSIS — M87852 Other osteonecrosis, left femur: Secondary | ICD-10-CM | POA: Diagnosis not present

## 2015-08-16 DIAGNOSIS — M79604 Pain in right leg: Secondary | ICD-10-CM | POA: Diagnosis not present

## 2015-08-16 DIAGNOSIS — M533 Sacrococcygeal disorders, not elsewhere classified: Secondary | ICD-10-CM

## 2015-08-16 DIAGNOSIS — Z96643 Presence of artificial hip joint, bilateral: Secondary | ICD-10-CM

## 2015-08-16 DIAGNOSIS — M5481 Occipital neuralgia: Secondary | ICD-10-CM

## 2015-08-16 DIAGNOSIS — I70209 Unspecified atherosclerosis of native arteries of extremities, unspecified extremity: Secondary | ICD-10-CM

## 2015-08-16 DIAGNOSIS — M87051 Idiopathic aseptic necrosis of right femur: Secondary | ICD-10-CM

## 2015-08-16 DIAGNOSIS — M87052 Idiopathic aseptic necrosis of left femur: Secondary | ICD-10-CM

## 2015-08-16 DIAGNOSIS — G90523 Complex regional pain syndrome I of lower limb, bilateral: Secondary | ICD-10-CM

## 2015-08-16 DIAGNOSIS — M47817 Spondylosis without myelopathy or radiculopathy, lumbosacral region: Secondary | ICD-10-CM | POA: Diagnosis not present

## 2015-08-16 DIAGNOSIS — M5136 Other intervertebral disc degeneration, lumbar region: Secondary | ICD-10-CM

## 2015-08-16 DIAGNOSIS — M47816 Spondylosis without myelopathy or radiculopathy, lumbar region: Secondary | ICD-10-CM

## 2015-08-16 MED ORDER — ORPHENADRINE CITRATE 30 MG/ML IJ SOLN
60.0000 mg | Freq: Once | INTRAMUSCULAR | Status: DC
  Filled 2015-08-16: qty 2

## 2015-08-16 MED ORDER — CIPROFLOXACIN HCL 250 MG PO TABS: 250.0000 mg | ORAL_TABLET | Freq: Two times a day (BID) | ORAL | Status: DC

## 2015-08-16 MED ORDER — MIDAZOLAM HCL 5 MG/5ML IJ SOLN
5.0000 mg | Freq: Once | INTRAMUSCULAR | Status: AC
  Administered 2015-08-16: 5 mg via INTRAVENOUS
  Filled 2015-08-16: qty 5

## 2015-08-16 MED ORDER — LIDOCAINE HCL (PF) 1 % IJ SOLN
10.0000 mL | Freq: Once | INTRAMUSCULAR | Status: AC
  Administered 2015-08-16: 10 mL via SUBCUTANEOUS
  Filled 2015-08-16: qty 10

## 2015-08-16 MED ORDER — BUPIVACAINE HCL (PF) 0.25 % IJ SOLN
30.0000 mL | Freq: Once | INTRAMUSCULAR | Status: AC
  Administered 2015-08-16: 30 mL
  Filled 2015-08-16: qty 30

## 2015-08-16 MED ORDER — CIPROFLOXACIN IN D5W 400 MG/200ML IV SOLN
400.0000 mg | Freq: Once | INTRAVENOUS | Status: AC
  Administered 2015-08-16: 400 mg via INTRAVENOUS
  Filled 2015-08-16: qty 200

## 2015-08-16 MED ORDER — LACTATED RINGERS IV SOLN: 1000.0000 mL | INTRAVENOUS | Status: DC

## 2015-08-16 MED ORDER — FENTANYL CITRATE (PF) 100 MCG/2ML IJ SOLN
100.0000 ug | Freq: Once | INTRAMUSCULAR | Status: AC
  Administered 2015-08-16: 100 ug via INTRAVENOUS
  Filled 2015-08-16: qty 2

## 2015-08-16 NOTE — Progress Notes (Signed)
Subjective:    Patient ID: Amy Rocha, female    DOB: 30-Aug-1955, 60 y.o.   MRN: [id number]  HPI  PROCEDURE PERFORMED: Lumbar sympathetic block.  HISTORY OF PRESENT ILLNESS: The patient is 60 y.o. female who returns to [place] for further evaluation and treatment of pain involving the lower extremities. The patient is with history of avascular necrosis of the hips and is with cold sensation of the lower extremities with pain described as stinging and cold sensation of the lower extremities . There is concern regarding the patient's pain being due to vascular compromise as well as component of complex regional pain syndrome . The risks, benefits, and expectations of the procedure were discussed and explained to the patient who was understanding and wished to proceed with interventional treatment as planned.   DESCRIPTION OF PROCEDURE: Lumbar sympathetic block with IV Versed, IV fentanyl conscious sedation, EKG, blood pressure, pulse, capnography, and pulse oximetry monitoring. The procedure was performed with the patient in prone position under fluoroscopic guidance.   NEEDLE PLACEMENT AT L2, left side lumbar sympathetic block: With the patient in prone position and oblique orientation of 20 degrees, Betadine prep and local anesthetic skin wheal of 1.5% lidocaine plain was prepared at the proposed needle entry site. Under fluoroscopic guidance with 20 degrees oblique orientation, the 22 -gauge needle was inserted at the lateral border of the L2 vertebral body on the left side.   NEEDLE PLACEMENT AT L3, left side lumbar sympathetic block: With the patient in the prone position and oblique orientation of 20 degrees, Betadine prep and local anesthetic skin wheal of 1.5% lidocaine plain was prepared at the proposed needle entry site. Under fluoroscopic guidance with 20 degrees  oblique orientation, the 22 -gauge needle was inserted at the lateral border of the L3 vertebral body on the  left side.   NEEDLE PLACEMENT AT L4, left side lumbar sympathetic block: With the patient in prone position and oblique orientation of 20 degrees, Betadine prep and local anesthetic skin wheal of 1.5% lidocaine plain was prepared at the proposed needle entry site. Under fluoroscopic guidance with 20 degrees  oblique orientation, the 22 -gauge needle was inserted at the lateral border of the L4 vertebral body on the left side.    Following needle placement at the L2, L3 and L4 vertebral body levels on the left side, needle placement was then verified on lateral view with tip of the needle documented to be in the anterior third of the vertebral body of L2, L3 and L4 respectively. Following negative aspiration of each needle for heme and CSF, L2 vertebral body level needle was injected with 10 mL of 0.25% bupivacaine. L3 vertebral body level needle was injected with 10 mL of 0.25% bupivacaine. L4 vertebral body level was injected with 10 mL of 0.25% bupivacaine. Needles were removed. Please note temperature readings prior to lumbar sympathetic block were noted to be 85 degrees Fahrenheit and following completion of the lumbar sympathetic block temperature readings of the lower extremity were noted to be 91.6 degrees Fahrenheit. The patient tolerated the procedure well.  PLAN:   1. Medications: We will continue presently prescribed medications Cymbalta oxycodone acetaminophen and oxymorphone extended release at this time. 2. The patient is to follow-up with primary care physician C Wicker for further evaluation of blood pressure and general medical condition as discussed. 3. Surgical and vascular evaluations as discussed. 4. Neurological evaluation as discussed. 5. The patient may be candidate for radiofrequency procedures, implantation devices,  and other treatment pending response to treatment and follow-up evaluation. 6. The patient has been advised to adhere to proper body mechanics. 7. The patient has  been advised to call the Pain Management Center prior to scheduled return appointment should there be significant change in condition or have other concerns regarding condition prior to scheduled return appointment.   The patient was understanding and in agreement with suggested treatment plan.   Review of Systems     Objective:   Physical Exam        Assessment & Plan:

## 2015-08-16 NOTE — Progress Notes (Signed)
Safety precautions to be maintained throughout the outpatient stay will include: orient to surroundings, keep bed in low position, maintain call bell within reach at all times, provide assistance with transfer out of bed and ambulation.  

## 2015-08-16 NOTE — Patient Instructions (Addendum)
PLAN  Continue present medications Cymbalta Voltaren gel oxycodone acetaminophen oxymorphone extended release Please obtain Cipro antibiotic today and begin taking Cipro antibiotic as prescribed .  F/U PCP C Wicker for evaliation of  BP and general medical  condition as discussed.   F/U surgical evaluation. May consider pending follow-up evaluations  F/U neurological evaluation. May consider further evaluation of headache with neurological consultation pending response to procedure performed today and follow-up evaluations  May consider radiofrequency rhizolysis or intraspinal procedures pending response to present treatment and F/U evaluation   Patient to call Pain Management Center should patient have concerns prior to scheduled return appointmentPain Management Discharge Instructions  General Discharge Instructions :  If you need to reach your doctor call: Monday-Friday 8:00 am - 4:00 pm at [phone] or toll free [phone].  After clinic hours [phone] to have operator reach doctor.  Bring all of your medication bottles to all your appointments in the pain clinic.  To cancel or reschedule your appointment with Pain Management please remember to call 24 hours in advance to avoid a fee.  Refer to the educational materials which you have been given on: General Risks, I had my Procedure. Discharge Instructions, Post Sedation.  Post Procedure Instructions:  The drugs you were given will stay in your system until tomorrow, so for the next 24 hours you should not drive, make any legal decisions or drink any alcoholic beverages.  You may eat anything you prefer, but it is better to start with liquids then soups and crackers, and gradually work up to solid foods.  Please notify your doctor immediately if you have any unusual bleeding, trouble breathing or pain that is not related to your normal pain.  Depending on the type of procedure that was done, some parts of your body may  feel week and/or numb.  This usually clears up by tonight or the next day.  Walk with the use of an assistive device or accompanied by an adult for the 24 hours.  You may use ice on the affected area for the first 24 hours.  Put ice in a Ziploc bag and cover with a towel and place against area 15 minutes on 15 minutes off.  You may switch to heat after 24 hours.GENERAL RISKS AND COMPLICATIONS  What are the risk, side effects and possible complications? Generally speaking, most procedures are safe.  However, with any procedure there are risks, side effects, and the possibility of complications.  The risks and complications are dependent upon the sites that are lesioned, or the type of nerve block to be performed.  The closer the procedure is to the spine, the more serious the risks are.  Great care is taken when placing the radio frequency needles, block needles or lesioning probes, but sometimes complications can occur. 1. Infection: Any time there is an injection through the skin, there is a risk of infection.  This is why sterile conditions are used for these blocks.  There are four possible types of infection. 1. Localized skin infection. 2. Central Nervous System Infection-This can be in the form of Meningitis, which can be deadly. 3. Epidural Infections-This can be in the form of an epidural abscess, which can cause pressure inside of the spine, causing compression of the spinal cord with subsequent paralysis. This would require an emergency surgery to decompress, and there are no guarantees that the patient would recover from the paralysis. 4. Discitis-This is an infection of the intervertebral discs.  It occurs in about 1%  of discography procedures.  It is difficult to treat and it may lead to surgery.        2. Pain: the needles have to go through skin and soft tissues, will cause soreness.       3. Damage to internal structures:  The nerves to be lesioned may be near blood vessels or     other nerves which can be potentially damaged.       4. Bleeding: Bleeding is more common if the patient is taking blood thinners such as  aspirin, Coumadin, Ticiid, Plavix, etc., or if he/she have some genetic predisposition  such as hemophilia. Bleeding into the spinal canal can cause compression of the spinal  cord with subsequent paralysis.  This would require an emergency surgery to  decompress and there are no guarantees that the patient would recover from the  paralysis.       5. Pneumothorax:  Puncturing of a lung is a possibility, every time a needle is introduced in  the area of the chest or upper back.  Pneumothorax refers to free air around the  collapsed lung(s), inside of the thoracic cavity (chest cavity).  Another two possible  complications related to a similar event would include: Hemothorax and Chylothorax.   These are variations of the Pneumothorax, where instead of air around the collapsed  lung(s), you may have blood or chyle, respectively.       6. Spinal headaches: They may occur with any procedures in the area of the spine.       7. Persistent CSF (Cerebro-Spinal Fluid) leakage: This is a rare problem, but may occur  with prolonged intrathecal or epidural catheters either due to the formation of a fistulous  track or a dural tear.       8. Nerve damage: By working so close to the spinal cord, there is always a possibility of  nerve damage, which could be as serious as a permanent spinal cord injury with  paralysis.       9. Death:  Although rare, severe deadly allergic reactions known as "Anaphylactic  reaction" can occur to any of the medications used.      10. Worsening of the symptoms:  We can always make thing worse.  What are the chances of something like this happening? Chances of any of this occuring are extremely low.  By statistics, you have more of a chance of getting killed in a motor vehicle accident: while driving to the hospital than any of the above occurring .   Nevertheless, you should be aware that they are possibilities.  In general, it is similar to taking a shower.  Everybody knows that you can slip, hit your head and get killed.  Does that mean that you should not shower again?  Nevertheless always keep in mind that statistics do not mean anything if you happen to be on the wrong side of them.  Even if a procedure has a 1 (one) in a 1,000,000 (million) chance of going wrong, it you happen to be that one..Also, keep in mind that by statistics, you have more of a chance of having something go wrong when taking medications.  Who should not have this procedure? If you are on a blood thinning medication (e.g. Coumadin, Plavix, see list of "Blood Thinners"), or if you have an active infection going on, you should not have the procedure.  If you are taking any blood thinners, please inform your physician.  How should I prepare  for this procedure?  Do not eat or drink anything at least six hours prior to the procedure.  Bring a driver with you .  It cannot be a taxi.  Come accompanied by an adult that can drive you back, and that is strong enough to help you if your legs get weak or numb from the local anesthetic.  Take all of your medicines the morning of the procedure with just enough water to swallow them.  If you have diabetes, make sure that you are scheduled to have your procedure done first thing in the morning, whenever possible.  If you have diabetes, take only half of your insulin dose and notify our nurse that you have done so as soon as you arrive at the clinic.  If you are diabetic, but only take blood sugar pills (oral hypoglycemic), then do not take them on the morning of your procedure.  You may take them after you have had the procedure.  Do not take aspirin or any aspirin-containing medications, at least eleven (11) days prior to the procedure.  They may prolong bleeding.  Wear loose fitting clothing that may be easy to take off and  that you would not mind if it got stained with Betadine or blood.  Do not wear any jewelry or perfume  Remove any nail coloring.  It will interfere with some of our monitoring equipment.  NOTE: Remember that this is not meant to be interpreted as a complete list of all possible complications.  Unforeseen problems may occur.  BLOOD THINNERS The following drugs contain aspirin or other products, which can cause increased bleeding during surgery and should not be taken for 2 weeks prior to and 1 week after surgery.  If you should need take something for relief of minor pain, you may take acetaminophen which is found in Tylenol,m Datril, Anacin-3 and Panadol. It is not blood thinner. The products listed below are.  Do not take any of the products listed below in addition to any listed on your instruction sheet.  A.P.C or A.P.C with Codeine Codeine Phosphate Capsules #3 Ibuprofen Ridaura  ABC compound Congesprin Imuran rimadil  Advil Cope Indocin Robaxisal  Alka-Seltzer Effervescent Pain Reliever and Antacid Coricidin or Coricidin-D  Indomethacin Rufen  Alka-Seltzer plus Cold Medicine Cosprin Ketoprofen S-A-C Tablets  Anacin Analgesic Tablets or Capsules Coumadin Korlgesic Salflex  Anacin Extra Strength Analgesic tablets or capsules CP-2 Tablets Lanoril Salicylate  Anaprox Cuprimine Capsules Levenox Salocol  Anexsia-D Dalteparin Magan Salsalate  Anodynos Darvon compound Magnesium Salicylate Sine-off  Ansaid Dasin Capsules Magsal Sodium Salicylate  Anturane Depen Capsules Marnal Soma  APF Arthritis pain formula Dewitt's Pills Measurin Stanback  Argesic Dia-Gesic Meclofenamic Sulfinpyrazone  Arthritis Bayer Timed Release Aspirin Diclofenac Meclomen Sulindac  Arthritis pain formula Anacin Dicumarol Medipren Supac  Analgesic (Safety coated) Arthralgen Diffunasal Mefanamic Suprofen  Arthritis Strength Bufferin Dihydrocodeine Mepro Compound Suprol  Arthropan liquid Dopirydamole Methcarbomol with  Aspirin Synalgos  ASA tablets/Enseals Disalcid Micrainin Tagament  Ascriptin Doan's Midol Talwin  Ascriptin A/D Dolene Mobidin Tanderil  Ascriptin Extra Strength Dolobid Moblgesic Ticlid  Ascriptin with Codeine Doloprin or Doloprin with Codeine Momentum Tolectin  Asperbuf Duoprin Mono-gesic Trendar  Aspergum Duradyne Motrin or Motrin IB Triminicin  Aspirin plain, buffered or enteric coated Durasal Myochrisine Trigesic  Aspirin Suppositories Easprin Nalfon Trillsate  Aspirin with Codeine Ecotrin Regular or Extra Strength Naprosyn Uracel  Atromid-S Efficin Naproxen Ursinus  Auranofin Capsules Elmiron Neocylate Vanquish  Axotal Emagrin Norgesic Verin  Azathioprine Empirin or Empirin with  Codeine Normiflo Vitamin E  Azolid Emprazil Nuprin Voltaren  Bayer Aspirin plain, buffered or children's or timed BC Tablets or powders Encaprin Orgaran Warfarin Sodium  Buff-a-Comp Enoxaparin Orudis Zorpin  Buff-a-Comp with Codeine Equegesic Os-Cal-Gesic   Buffaprin Excedrin plain, buffered or Extra Strength Oxalid   Bufferin Arthritis Strength Feldene Oxphenbutazone   Bufferin plain or Extra Strength Feldene Capsules Oxycodone with Aspirin   Bufferin with Codeine Fenoprofen Fenoprofen Pabalate or Pabalate-SF   Buffets II Flogesic Panagesic   Buffinol plain or Extra Strength Florinal or Florinal with Codeine Panwarfarin   Buf-Tabs Flurbiprofen Penicillamine   Butalbital Compound Four-way cold tablets Penicillin   Butazolidin Fragmin Pepto-Bismol   Carbenicillin Geminisyn Percodan   Carna Arthritis Reliever Geopen Persantine   Carprofen Gold's salt Persistin   Chloramphenicol Goody's Phenylbutazone   Chloromycetin Haltrain Piroxlcam   Clmetidine heparin Plaquenil   Cllnoril Hyco-pap Ponstel   Clofibrate Hydroxy chloroquine Propoxyphen         Before stopping any of these medications, be sure to consult the physician who ordered them.  Some, such as Coumadin (Warfarin) are ordered to prevent or  treat serious conditions such as "deep thrombosis", "pumonary embolisms", and other heart problems.  The amount of time that you may need off of the medication may also vary with the medication and the reason for which you were taking it.  If you are taking any of these medications, please make sure you notify your pain physician before you undergo any procedures.

## 2015-08-17 ENCOUNTER — Telehealth: Payer: Self-pay | Admitting: *Deleted

## 2015-08-17 NOTE — Telephone Encounter (Signed)
No answer

## 2015-08-30 ENCOUNTER — Ambulatory Visit: Payer: Medicare Other | Admitting: Unknown Physician Specialty

## 2015-09-05 ENCOUNTER — Encounter: Payer: Self-pay | Admitting: Pain Medicine

## 2015-09-05 ENCOUNTER — Ambulatory Visit: Payer: Medicare Other | Attending: Pain Medicine | Admitting: Pain Medicine

## 2015-09-05 VITALS — BP 126/94 | HR 123 | Temp 98.1°F | Resp 18 | Ht 64.0 in | Wt 150.0 lb

## 2015-09-05 DIAGNOSIS — Z96643 Presence of artificial hip joint, bilateral: Secondary | ICD-10-CM

## 2015-09-05 DIAGNOSIS — G90523 Complex regional pain syndrome I of lower limb, bilateral: Secondary | ICD-10-CM

## 2015-09-05 DIAGNOSIS — M87051 Idiopathic aseptic necrosis of right femur: Secondary | ICD-10-CM

## 2015-09-05 DIAGNOSIS — M4186 Other forms of scoliosis, lumbar region: Secondary | ICD-10-CM | POA: Diagnosis not present

## 2015-09-05 DIAGNOSIS — M47816 Spondylosis without myelopathy or radiculopathy, lumbar region: Secondary | ICD-10-CM

## 2015-09-05 DIAGNOSIS — M545 Low back pain: Secondary | ICD-10-CM | POA: Diagnosis present

## 2015-09-05 DIAGNOSIS — M5126 Other intervertebral disc displacement, lumbar region: Secondary | ICD-10-CM | POA: Insufficient documentation

## 2015-09-05 DIAGNOSIS — M87052 Idiopathic aseptic necrosis of left femur: Secondary | ICD-10-CM

## 2015-09-05 DIAGNOSIS — M533 Sacrococcygeal disorders, not elsewhere classified: Secondary | ICD-10-CM

## 2015-09-05 DIAGNOSIS — M5136 Other intervertebral disc degeneration, lumbar region: Secondary | ICD-10-CM | POA: Diagnosis not present

## 2015-09-05 DIAGNOSIS — M5481 Occipital neuralgia: Secondary | ICD-10-CM

## 2015-09-05 DIAGNOSIS — M51369 Other intervertebral disc degeneration, lumbar region without mention of lumbar back pain or lower extremity pain: Secondary | ICD-10-CM

## 2015-09-05 DIAGNOSIS — M5416 Radiculopathy, lumbar region: Secondary | ICD-10-CM | POA: Diagnosis not present

## 2015-09-05 DIAGNOSIS — M79606 Pain in leg, unspecified: Secondary | ICD-10-CM | POA: Diagnosis present

## 2015-09-05 DIAGNOSIS — M47817 Spondylosis without myelopathy or radiculopathy, lumbosacral region: Secondary | ICD-10-CM | POA: Diagnosis not present

## 2015-09-05 DIAGNOSIS — I70209 Unspecified atherosclerosis of native arteries of extremities, unspecified extremity: Secondary | ICD-10-CM

## 2015-09-05 DIAGNOSIS — M791 Myalgia: Secondary | ICD-10-CM | POA: Diagnosis not present

## 2015-09-05 MED ORDER — OXYCODONE-ACETAMINOPHEN 10-325 MG PO TABS: ORAL_TABLET | ORAL | Status: DC

## 2015-09-05 MED ORDER — OXYMORPHONE HCL ER 20 MG PO T12A: EXTENDED_RELEASE_TABLET | ORAL | Status: DC

## 2015-09-05 NOTE — Progress Notes (Signed)
   Subjective:    Patient ID: Amy Rocha, female    DOB: 28-Jun-1955, 60 y.o.   MRN: [id number]  HPI  The patient is a 60 year old female who returns to pain management for further evaluation and treatment of pain involving the lumbar lower extremity region. The patient is with history of avascular necrosis of the hip and is status post total hip replacement. The patient also has complaint of cold sensation of the lower extremities and there is concern regarding vascular component of patient's pain being present. We will proceed with lumbar sympathetic block at time return appointment as discussed. We will continue medications consisting of Cymbalta oxycodone acetaminophen and Opana. All agreed to suggested treatment plan  Review of Systems     Objective:   Physical Exam  There was tenderness of the splenius capitis and occipitalis region a mild degree with mild tenderness of the cervical facet cervical paraspinal musculature region. There was minimal tenderness of the acromial clavicular and glenohumeral joint region and patient was at unremarkable drop test and unremarkable Spurling's maneuver. The patient appeared to be with bilaterally equal grip strength without increased pain with Tinel and Phalen's maneuver. Palpation over the lumbar region was with mild discomfort with lateral bending rotation extension and palpation of the lumbar facets reproducing mild to moderate discomfort. Palpation over the PSIS and PII S region reproduced mild to moderate discomfort. There was straight leg raising tolerates approximately 20 without a definite increase of pain with dorsiflexion noted. There was decreased temperature of the lower extremities noted. There was negative clonus negative Homans. EHL strength appeared to be decreased. No sensory deficit or dermatomal distribution detected. Abdomen soft nontender with no costovertebral tenderness noted      Assessment & Plan:       Complex regional  pain syndrome of lower extremities versus vascular compromise  Degenerative disc disease lumbar spine Convex left scoliosis. Degenerative disc disease with facet hypertrophy at multiple levels involving L4-5 and L5-S1 predominantly. Mild disc bulging at L1-2 L4-5.  Lumbar facet syndrome  Sacroiliac joint dysfunction  Status post bilateral total hip replacement  Carpal syndrome     PLAN   Continue present medications Cymbalta  Voltaren gel oxycodone acetaminophen oxymorphone extended release . PATIENT STOPPED NEURONTIN. Patient did not tolerate Neurontin  Prescribe azithromycin (Z-Pak) instead of Cipro for procedures  Lumbar synthetic block to be performed at time of return appointment  F/U PCP C Wicker for evaliation of  BP and general medical  condition as discussed.  F/U surgical evaluation. May consider pending follow-up evaluations  Ask the nurses and secretary the date of your vascular evaluation as discussed for further evaluation of lower extremity pain  F/U neurological evaluation. May conside PNCV/EMG studies and other studies r pending follow-up evaluations  May consider radiofrequency rhizolysis or intraspinal procedures pending response to present treatment and F/U evaluation   Patient to call Pain Management Center should patient have concerns prior to scheduled return appointment

## 2015-09-05 NOTE — Patient Instructions (Addendum)
PLAN   Continue present medications Cymbalta  Voltaren gel oxycodone acetaminophen oxymorphone extended release . PATIENT STOPPED NEURONTIN. Patient did not tolerate Neurontin  Prescribe azithromycin (Z-Pak) instead of Cipro for procedures  Lumbar synthetic block to be performed at time of return appointment  F/U PCP C Wicker for evaliation of  BP and general medical  condition as discussed.  F/U surgical evaluation. May consider pending follow-up evaluations  Ask the nurses and secretary the date of your vascular evaluation as discussed for further evaluation of lower extremity pain  F/U neurological evaluation. May conside PNCV/EMG studies and other studies r pending follow-up evaluations  May consider radiofrequency rhizolysis or intraspinal procedures pending response to present treatment and F/U evaluation   Patient to call [facility] should patient have concerns prior to scheduled return appointmentGENERAL RISKS AND COMPLICATIONS  What are the risk, side effects and possible complications? Generally speaking, most procedures are safe.  However, with any procedure there are risks, side effects, and the possibility of complications.  The risks and complications are dependent upon the sites that are lesioned, or the type of nerve block to be performed.  The closer the procedure is to the spine, the more serious the risks are.  Great care is taken when placing the radio frequency needles, block needles or lesioning probes, but sometimes complications can occur. 1. Infection: Any time there is an injection through the skin, there is a risk of infection.  This is why sterile conditions are used for these blocks.  There are four possible types of infection. 1. Localized skin infection. 2. Central Nervous System Infection-This can be in the form of Meningitis, which can be deadly. 3. Epidural Infections-This can be in the form of an epidural abscess, which can cause pressure inside  of the spine, causing compression of the spinal cord with subsequent paralysis. This would require an emergency surgery to decompress, and there are no guarantees that the patient would recover from the paralysis. 4. Discitis-This is an infection of the intervertebral discs.  It occurs in about 1% of discography procedures.  It is difficult to treat and it may lead to surgery.        2. Pain: the needles have to go through skin and soft tissues, will cause soreness.       3. Damage to internal structures:  The nerves to be lesioned may be near blood vessels or    other nerves which can be potentially damaged.       4. Bleeding: Bleeding is more common if the patient is taking blood thinners such as  aspirin, Coumadin, Ticiid, Plavix, etc., or if he/she have some genetic predisposition  such as hemophilia. Bleeding into the spinal canal can cause compression of the spinal  cord with subsequent paralysis.  This would require an emergency surgery to  decompress and there are no guarantees that the patient would recover from the  paralysis.       5. Pneumothorax:  Puncturing of a lung is a possibility, every time a needle is introduced in  the area of the chest or upper back.  Pneumothorax refers to free air around the  collapsed lung(s), inside of the thoracic cavity (chest cavity).  Another two possible  complications related to a similar event would include: Hemothorax and Chylothorax.   These are variations of the Pneumothorax, where instead of air around the collapsed  lung(s), you may have blood or chyle, respectively.       6. Spinal  headaches: They may occur with any procedures in the area of the spine.       7. Persistent CSF (Cerebro-Spinal Fluid) leakage: This is a rare problem, but may occur  with prolonged intrathecal or epidural catheters either due to the formation of a fistulous  track or a dural tear.       8. Nerve damage: By working so close to the spinal cord, there is always a possibility  of  nerve damage, which could be as serious as a permanent spinal cord injury with  paralysis.       9. Death:  Although rare, severe deadly allergic reactions known as "Anaphylactic  reaction" can occur to any of the medications used.      10. Worsening of the symptoms:  We can always make thing worse.  What are the chances of something like this happening? Chances of any of this occuring are extremely low.  By statistics, you have more of a chance of getting killed in a motor vehicle accident: while driving to the hospital than any of the above occurring .  Nevertheless, you should be aware that they are possibilities.  In general, it is similar to taking a shower.  Everybody knows that you can slip, hit your head and get killed.  Does that mean that you should not shower again?  Nevertheless always keep in mind that statistics do not mean anything if you happen to be on the wrong side of them.  Even if a procedure has a 1 (one) in a 1,000,000 (million) chance of going wrong, it you happen to be that one..Also, keep in mind that by statistics, you have more of a chance of having something go wrong when taking medications.  Who should not have this procedure? If you are on a blood thinning medication (e.g. Coumadin, Plavix, see list of "Blood Thinners"), or if you have an active infection going on, you should not have the procedure.  If you are taking any blood thinners, please inform your physician.  How should I prepare for this procedure?  Do not eat or drink anything at least six hours prior to the procedure.  Bring a driver with you .  It cannot be a taxi.  Come accompanied by an adult that can drive you back, and that is strong enough to help you if your legs get weak or numb from the local anesthetic.  Take all of your medicines the morning of the procedure with just enough water to swallow them.  If you have diabetes, make sure that you are scheduled to have your procedure done first  thing in the morning, whenever possible.  If you have diabetes, take only half of your insulin dose and notify our nurse that you have done so as soon as you arrive at the clinic.  If you are diabetic, but only take blood sugar pills (oral hypoglycemic), then do not take them on the morning of your procedure.  You may take them after you have had the procedure.  Do not take aspirin or any aspirin-containing medications, at least eleven (11) days prior to the procedure.  They may prolong bleeding.  Wear loose fitting clothing that may be easy to take off and that you would not mind if it got stained with Betadine or blood.  Do not wear any jewelry or perfume  Remove any nail coloring.  It will interfere with some of our monitoring equipment.  NOTE: Remember that this is not meant  to be interpreted as a complete list of all possible complications.  Unforeseen problems may occur.  BLOOD THINNERS The following drugs contain aspirin or other products, which can cause increased bleeding during surgery and should not be taken for 2 weeks prior to and 1 week after surgery.  If you should need take something for relief of minor pain, you may take acetaminophen which is found in Tylenol,m Datril, Anacin-3 and Panadol. It is not blood thinner. The products listed below are.  Do not take any of the products listed below in addition to any listed on your instruction sheet.  A.P.C or A.P.C with Codeine Codeine Phosphate Capsules #3 Ibuprofen Ridaura  ABC compound Congesprin Imuran rimadil  Advil Cope Indocin Robaxisal  Alka-Seltzer Effervescent Pain Reliever and Antacid Coricidin or Coricidin-D  Indomethacin Rufen  Alka-Seltzer plus Cold Medicine Cosprin Ketoprofen S-A-C Tablets  Anacin Analgesic Tablets or Capsules Coumadin Korlgesic Salflex  Anacin Extra Strength Analgesic tablets or capsules CP-2 Tablets Lanoril Salicylate  Anaprox Cuprimine Capsules Levenox Salocol  Anexsia-D Dalteparin Magan  Salsalate  Anodynos Darvon compound Magnesium Salicylate Sine-off  Ansaid Dasin Capsules Magsal Sodium Salicylate  Anturane Depen Capsules Marnal Soma  APF Arthritis pain formula Dewitt's Pills Measurin Stanback  Argesic Dia-Gesic Meclofenamic Sulfinpyrazone  Arthritis Bayer Timed Release Aspirin Diclofenac Meclomen Sulindac  Arthritis pain formula Anacin Dicumarol Medipren Supac  Analgesic (Safety coated) Arthralgen Diffunasal Mefanamic Suprofen  Arthritis Strength Bufferin Dihydrocodeine Mepro Compound Suprol  Arthropan liquid Dopirydamole Methcarbomol with Aspirin Synalgos  ASA tablets/Enseals Disalcid Micrainin Tagament  Ascriptin Doan's Midol Talwin  Ascriptin A/D Dolene Mobidin Tanderil  Ascriptin Extra Strength Dolobid Moblgesic Ticlid  Ascriptin with Codeine Doloprin or Doloprin with Codeine Momentum Tolectin  Asperbuf Duoprin Mono-gesic Trendar  Aspergum Duradyne Motrin or Motrin IB Triminicin  Aspirin plain, buffered or enteric coated Durasal Myochrisine Trigesic  Aspirin Suppositories Easprin Nalfon Trillsate  Aspirin with Codeine Ecotrin Regular or Extra Strength Naprosyn Uracel  Atromid-S Efficin Naproxen Ursinus  Auranofin Capsules Elmiron Neocylate Vanquish  Axotal Emagrin Norgesic Verin  Azathioprine Empirin or Empirin with Codeine Normiflo Vitamin E  Azolid Emprazil Nuprin Voltaren  Bayer Aspirin plain, buffered or children's or timed BC Tablets or powders Encaprin Orgaran Warfarin Sodium  Buff-a-Comp Enoxaparin Orudis Zorpin  Buff-a-Comp with Codeine Equegesic Os-Cal-Gesic   Buffaprin Excedrin plain, buffered or Extra Strength Oxalid   Bufferin Arthritis Strength Feldene Oxphenbutazone   Bufferin plain or Extra Strength Feldene Capsules Oxycodone with Aspirin   Bufferin with Codeine Fenoprofen Fenoprofen Pabalate or Pabalate-SF   Buffets II Flogesic Panagesic   Buffinol plain or Extra Strength Florinal or Florinal with Codeine Panwarfarin   Buf-Tabs Flurbiprofen  Penicillamine   Butalbital Compound Four-way cold tablets Penicillin   Butazolidin Fragmin Pepto-Bismol   Carbenicillin Geminisyn Percodan   Carna Arthritis Reliever Geopen Persantine   Carprofen Gold's salt Persistin   Chloramphenicol Goody's Phenylbutazone   Chloromycetin Haltrain Piroxlcam   Clmetidine heparin Plaquenil   Cllnoril Hyco-pap Ponstel   Clofibrate Hydroxy chloroquine Propoxyphen         Before stopping any of these medications, be sure to consult the physician who ordered them.  Some, such as Coumadin (Warfarin) are ordered to prevent or treat serious conditions such as "deep thrombosis", "pumonary embolisms", and other heart problems.  The amount of time that you may need off of the medication may also vary with the medication and the reason for which you were taking it.  If you are taking any of these medications, please make sure you notify  your pain physician before you undergo any procedures.         Lumbar Sympathetic Block Patient Information  Description: The lumbar plexus is a group of nerves that are part of the sympathetic nervous system.  These nerves supply organs in the pelvis and legs.  Lumbar sympathetic blocks are utilized for the diagnosis and treatment of painful conditions in these areas.   The lumbar plexus is located on both sides of the aorta at approximately the level of the second lumbar vertebral body.  The block will be performed with you lying on your abdomen with a pillow underneath.  Using direct x-ray guidance,   The plexus will be located on both sides of the spine.  Numbing medicine will be used to deaden the skin prior to needle insertion.  In most cases, a small amount of sedation can be give by IV prior to the numbing medicine.  One or two small needles will be placed near the plexus and local anesthetic will be injected.  This may make your leg(s) feel warm.  The Entire block usually lasts about 15-25 minutes.  Conditions which may be  treated by lumbar sympathetic block:   Reflex sympathetic dystrophy  Phantom limb pain  Peripheral neuropathy  Peripheral vascular disease ( inadequate blood flow )  Cancer pain of pelvis, leg and kidney  Preparation for the injection:  1. Do note eat any solid food or diary products within 8 hours of your appointment. 2. You may drink clear liquids up to 3 hours before appointment.  Clear liquids include water, black coffee, juice or soda.  No milk or cream please. 3. You may take your regular medication, including pain medications, with a sip of water before you appointment.  Diabetics should hold regular insulin ( if taken separately ) and take 1/2 NPH dose the morning of the procedure .  Carry some sugar containing items with you to your appointment. 4. A driver must accompany you and be prepared to drive you home after your procedure. 5. Bring all your current medication with you. 6. An IV may be inserted and sedation may be given at the discretion of the physician.  7. A blood pressure cuff, EKG and other monitors will often be applied during the procedure.  Some patients may need to have extra oxygen administered for a short period. 8. You will be asked to provide medical information, including your allergies and medications, prior to the procedure.  We must know immediately if your taking blood thinners (like Coumadin/Warfarin) or if you are allergic to IV iodine contrast (dye).  We must know if you could possibly be pregnant.  Possible side-effects   Bleeding from needle site or deeper  Infection (rare, can require surgery)  Nerve injury (rare)  Numbness & tingling (temporary)  Collapsed lung (rare)  Spinal headache (a headache worse with upright posture)  Light-headedness (temporary)  Pain at injection site (several days)  Decreased blood pressure (temporary)  Weakness in legs (temporary)  Seizure or other drug reaction (rare)  Call if you  experience:   Fever/chills associated with headache or increased back/ neck pain  Headache worsened by an upright position  New onset weakness or numbness of an extremity below the injection site  Hives or difficulty breathing ( go to the emergency room)  Inflammation or drainage at the injections site(s)  New symptoms which are concerning to you  Please note:  If effective, we will often do a series of 2-3 injections spaced  3-6 weeks apart to maximally decrease your pain.  If initial series is effective, you may be a candidate for a more permanent block of the lumbar sympathetic plexus.  If you have any questions please call [phone] [facility]

## 2015-09-05 NOTE — Progress Notes (Signed)
Safety precautions to be maintained throughout the outpatient stay will include: orient to surroundings, keep bed in low position, maintain call bell within reach at all times, provide assistance with transfer out of bed and ambulation.  

## 2015-09-06 ENCOUNTER — Encounter: Payer: Self-pay | Admitting: Unknown Physician Specialty

## 2015-09-06 ENCOUNTER — Ambulatory Visit (INDEPENDENT_AMBULATORY_CARE_PROVIDER_SITE_OTHER): Payer: Medicare Other | Admitting: Unknown Physician Specialty

## 2015-09-06 VITALS — BP 126/79 | HR 91 | Temp 98.0°F | Ht 64.2 in | Wt 167.2 lb

## 2015-09-06 DIAGNOSIS — I110 Hypertensive heart disease with heart failure: Secondary | ICD-10-CM

## 2015-09-06 DIAGNOSIS — G4700 Insomnia, unspecified: Secondary | ICD-10-CM | POA: Diagnosis not present

## 2015-09-06 DIAGNOSIS — F329 Major depressive disorder, single episode, unspecified: Secondary | ICD-10-CM | POA: Diagnosis not present

## 2015-09-06 DIAGNOSIS — E785 Hyperlipidemia, unspecified: Secondary | ICD-10-CM | POA: Diagnosis not present

## 2015-09-06 DIAGNOSIS — Z0000 Encounter for general adult medical examination without abnormal findings: Secondary | ICD-10-CM | POA: Diagnosis not present

## 2015-09-06 DIAGNOSIS — I70209 Unspecified atherosclerosis of native arteries of extremities, unspecified extremity: Secondary | ICD-10-CM | POA: Diagnosis not present

## 2015-09-06 DIAGNOSIS — F32A Depression, unspecified: Secondary | ICD-10-CM

## 2015-09-06 NOTE — Patient Instructions (Addendum)
Take TV out of room Don't drink caffeine after 12n Take depression medications in the AM Exercise during the day No TV or computer an hour before bed.    Insomnia is a frustrating problem and fortunately, for most, it can be managed with lifestyle changes.  I  find the most effective strategies seem to be exercise, decreasing caffeine and screen time.    In addition to the above, studies have shown that cognitive behavioral therapies for sleep are more effective than medications.  There are some less expensive on-line programs for this that are a self-paced 6 week program.  Go to shuti.com(used by sleep labs) or ExoticFirm.is.  There are others that are probably just as effective.    What I do is 1. drink 2 tbsps of concentrated tart cherry juice in seltzer water.  2. Take Magnesium supplement (I use Magnesium Citrate a product called Calm and Relax).  3. Take a supplement called L Theanine 150 mg.             Insomnia Insomnia is a sleep disorder that makes it difficult to fall asleep or to stay asleep. Insomnia can cause tiredness (fatigue), low energy, difficulty concentrating, mood swings, and poor performance at work or school.  There are three different ways to classify insomnia:  Difficulty falling asleep.  Difficulty staying asleep.  Waking up too early in the morning. Any type of insomnia can be long-term (chronic) or short-term (acute). Both are common. Short-term insomnia usually lasts for three months or less. Chronic insomnia occurs at least three times a week for longer than three months. CAUSES  Insomnia may be caused by another condition, situation, or substance, such as:  Anxiety.  Certain medicines.  Gastroesophageal reflux disease (GERD) or other gastrointestinal conditions.  Asthma or other breathing conditions.  Restless legs syndrome, sleep apnea, or other sleep disorders.  Chronic pain.  Menopause. This may include hot  flashes.  Stroke.  Abuse of alcohol, tobacco, or illegal drugs.  Depression.  Caffeine.   Neurological disorders, such as Alzheimer disease.  An overactive thyroid (hyperthyroidism). The cause of insomnia may not be known. RISK FACTORS Risk factors for insomnia include:  Gender. Women are more commonly affected than men.  Age. Insomnia is more common as you get older.  Stress. This may involve your professional or personal life.  Income. Insomnia is more common in people with lower income.  Lack of exercise.   Irregular work schedule or night shifts.  Traveling between different time zones. SIGNS AND SYMPTOMS If you have insomnia, trouble falling asleep or trouble staying asleep is the main symptom. This may lead to other symptoms, such as:  Feeling fatigued.  Feeling nervous about going to sleep.  Not feeling rested in the morning.  Having trouble concentrating.  Feeling irritable, anxious, or depressed. TREATMENT  Treatment for insomnia depends on the cause. If your insomnia is caused by an underlying condition, treatment will focus on addressing the condition. Treatment may also include:   Medicines to help you sleep.  Counseling or therapy.  Lifestyle adjustments. HOME CARE INSTRUCTIONS   Take medicines only as directed by your health care provider.  Keep regular sleeping and waking hours. Avoid naps.  Keep a sleep diary to help you and your health care provider figure out what could be causing your insomnia. Include:   When you sleep.  When you wake up during the night.  How well you sleep.   How rested you feel the next day.  Any side effects of medicines you are taking.  What you eat and drink.   Make your bedroom a comfortable place where it is easy to fall asleep:  Put up shades or special blackout curtains to block light from outside.  Use a white noise machine to block noise.  Keep the temperature cool.   Exercise  regularly as directed by your health care provider. Avoid exercising right before bedtime.  Use relaxation techniques to manage stress. Ask your health care provider to suggest some techniques that may work well for you. These may include:  Breathing exercises.  Routines to release muscle tension.  Visualizing peaceful scenes.  Cut back on alcohol, caffeinated beverages, and cigarettes, especially close to bedtime. These can disrupt your sleep.  Do not overeat or eat spicy foods right before bedtime. This can lead to digestive discomfort that can make it hard for you to sleep.  Limit screen use before bedtime. This includes:  Watching TV.  Using your smartphone, tablet, and computer.  Stick to a routine. This can help you fall asleep faster. Try to do a quiet activity, brush your teeth, and go to bed at the same time each night.  Get out of bed if you are still awake after 15 minutes of trying to sleep. Keep the lights down, but try reading or doing a quiet activity. When you feel sleepy, go back to bed.  Make sure that you drive carefully. Avoid driving if you feel very sleepy.  Keep all follow-up appointments as directed by your health care provider. This is important. SEEK MEDICAL CARE IF:   You are tired throughout the day or have trouble in your daily routine due to sleepiness.  You continue to have sleep problems or your sleep problems get worse. SEEK IMMEDIATE MEDICAL CARE IF:   You have serious thoughts about hurting yourself or someone else.   This information is not intended to replace advice given to you by your health care provider. Make sure you discuss any questions you have with your health care provider.   Document Released: 02/02/2000 Document Revised: 10/26/2014 Document Reviewed: 11/05/2013 Elsevier Interactive Patient Education [employer].

## 2015-09-06 NOTE — Assessment & Plan Note (Signed)
Worsening of depression.

## 2015-09-06 NOTE — Progress Notes (Signed)
BP 126/79 mmHg  Pulse 91  Temp(Src) 98 F (36.7 C)  Ht 5' 4.2" (1.631 m)  Wt 167 lb 3.2 oz (75.841 kg)  BMI 28.51 kg/m2  SpO2 94%  LMP 09/13/1975 (Approximate)   Subjective:    Patient ID: Amy Rocha, female    DOB: 08-17-1955, 60 y.o.   MRN: [id number]  HPI: Amy Rocha is a 60 y.o. female  Chief Complaint  Patient presents with  . Depression  . Hyperlipidemia  . Hypertension   Hypertension Using medications without difficulty Average home BPs Not checking  No problems or lightheadedness No chest pain with exertion or shortness of breath No Edema  Hyperlipidemia Using medications without problems: No Muscle aches  Diet compliance: "cut back" and lost 5 pounds.   Exercise: none  Insomnia Trouble sleeping.  States she can stay up all night for 2 nights in a row.   States she goes to bed at 10-11p and stays awake all night or is "up and down."  She does snore.  Drinks caffeine up to 7p at night.    Depression This is stable.  Depression screen [facility] 2/9 09/06/2015 09/05/2015 08/16/2015 08/08/2015 07/11/2015  Decreased Interest 3 0 0 0 0  Down, Depressed, Hopeless 2 0 0 0 0  PHQ - 2 Score 5 0 0 0 0  Altered sleeping 3 - - - -  Tired, decreased energy 3 - - - -  Change in appetite 0 - - - -  Feeling bad or failure about yourself  0 - - - -  Trouble concentrating 0 - - - -  Moving slowly or fidgety/restless 0 - - - -  Suicidal thoughts 0 - - - -  PHQ-9 Score 11 - - - -       Relevant past medical, surgical, family and social history reviewed and updated as indicated. Interim medical history since our last visit reviewed. Allergies and medications reviewed and updated.  Review of Systems  Per HPI unless specifically indicated above     Objective:    BP 126/79 mmHg  Pulse 91  Temp(Src) 98 F (36.7 C)  Ht 5' 4.2" (1.631 m)  Wt 167 lb 3.2 oz (75.841 kg)  BMI 28.51 kg/m2  SpO2 94%  LMP 09/13/1975 (Approximate)  Wt Readings from Last 3 Encounters:   09/06/15 167 lb 3.2 oz (75.841 kg)  09/05/15 150 lb (68.04 kg)  08/16/15 160 lb (72.576 kg)    Physical Exam  Constitutional: She is oriented to person, place, and time. She appears well-developed and well-nourished. No distress.  HENT:  Head: Normocephalic and atraumatic.  Eyes: Conjunctivae and lids are normal. Right eye exhibits no discharge. Left eye exhibits no discharge. No scleral icterus.  Neck: Normal range of motion. Neck supple. No JVD present. Carotid bruit is not present.  Cardiovascular: Normal rate, regular rhythm and normal heart sounds.   Pulmonary/Chest: Effort normal and breath sounds normal.  Abdominal: Normal appearance. There is no splenomegaly or hepatomegaly.  Musculoskeletal: Normal range of motion.  Neurological: She is alert and oriented to person, place, and time.  Skin: Skin is warm, dry and intact. No rash noted. No pallor.  Psychiatric: She has a normal mood and affect. Her behavior is normal. Judgment and thought content normal.    Results for orders placed or performed during the hospital encounter of 05/31/15  Urine culture  Result Value Ref Range   Specimen Description URINE, RANDOM    Special Requests Normal  Culture (A)     70,000 COLONIES/mL GROUP B STREP(S.AGALACTIAE)ISOLATED There is no known Penicillin Resistant Beta Streptococcus in the [place] For patients that are Penicillin-allergic, Erythromycin is 85-94% susceptible, and Clindamycin is 80% susceptible.  Contact Microbiology within 7 days if sensitivity testing is  required.   Virtually 100% of S. agalactiae (Group B) strains are susceptible to Penicillin.  For Penicillin-allergic patients, Erythromycin (85-95% sensitive) and Clindamycin (80% sensitive) are drugs of choice. Contact microbiology lab to request sensitivities if  needed within 7 days.    Report Status 06/02/2015 FINAL   Basic metabolic panel  Result Value Ref Range   Sodium 135 135 - 145 mmol/L   Potassium 3.8 3.5 - 5.1  mmol/L   Chloride 106 101 - 111 mmol/L   CO2 23 22 - 32 mmol/L   Glucose, Bld 111 (H) 65 - 99 mg/dL   BUN 11 6 - 20 mg/dL   Creatinine, Ser 0.95 0.44 - 1.00 mg/dL   Calcium 8.8 (L) 8.9 - 10.3 mg/dL   GFR calc non Af Amer >60 >60 mL/min   GFR calc Af Amer >60 >60 mL/min   Anion gap 6 5 - 15  CBC  Result Value Ref Range   WBC 9.9 3.6 - 11.0 K/uL   RBC 3.98 3.80 - 5.20 MIL/uL   Hemoglobin 12.1 12.0 - 16.0 g/dL   HCT 35.8 35.0 - 47.0 %   MCV 89.9 80.0 - 100.0 fL   MCH 30.4 26.0 - 34.0 pg   MCHC 33.8 32.0 - 36.0 g/dL   RDW 15.1 (H) 11.5 - 14.5 %   Platelets 299 150 - 440 K/uL  Urinalysis complete, with microscopic- may I&O cath if menses ([facility] only)  Result Value Ref Range   Color, Urine YELLOW (A) YELLOW   APPearance HAZY (A) CLEAR   Glucose, UA NEGATIVE NEGATIVE mg/dL   Bilirubin Urine NEGATIVE NEGATIVE   Ketones, ur NEGATIVE NEGATIVE mg/dL   Specific Gravity, Urine 1.006 1.005 - 1.030   Hgb urine dipstick NEGATIVE NEGATIVE   pH 5.0 5.0 - 8.0   Protein, ur NEGATIVE NEGATIVE mg/dL   Nitrite NEGATIVE NEGATIVE   Leukocytes, UA TRACE (A) NEGATIVE   RBC / HPF 0-5 0 - 5 RBC/hpf   WBC, UA 0-5 0 - 5 WBC/hpf   Bacteria, UA RARE (A) NONE SEEN   Squamous Epithelial / LPF 0-5 (A) NONE SEEN   Mucous PRESENT   Troponin I  Result Value Ref Range   Troponin I <0.03 <0.031 ng/mL  Brain natriuretic peptide  Result Value Ref Range   B Natriuretic Peptide 38.0 0.0 - 100.0 pg/mL      Assessment & Plan:   Problem List Items Addressed This Visit      Unprioritized   Depression    Worsening of depression.        Hyperlipidemia    Stable, continue present medications.        Relevant Orders   Lipid Panel w/o Chol/HDL Ratio   Hypertensive heart failure (HCC)   Relevant Orders   Comprehensive metabolic panel   TSH   Insomnia    Pt ed on sleep.  Pt ed on snoring.  Will refer for sleep study.  Consider adding Seroquel.  Move depression medications to the morning      Relevant  Orders   Ambulatory referral to Sleep Studies   TSH    Other Visit Diagnoses    Health care maintenance    -  Primary  Routine general medical examination at a health care facility        Relevant Orders    Hepatitis C antibody        Follow up plan: Return in about 2 months (around 11/07/2015).

## 2015-09-06 NOTE — Assessment & Plan Note (Addendum)
Pt ed on sleep.  Pt ed on snoring.  Will refer for sleep study.  Consider adding Seroquel.  Move depression medications to the morning

## 2015-09-06 NOTE — Assessment & Plan Note (Signed)
Stable, continue present medications.   

## 2015-09-07 LAB — COMPREHENSIVE METABOLIC PANEL
ALBUMIN: 4.5 g/dL (ref 3.6–4.8)
ALK PHOS: 104 IU/L (ref 39–117)
ALT: 35 IU/L — AB (ref 0–32)
AST: 35 IU/L (ref 0–40)
Albumin/Globulin Ratio: 2.3 — ABNORMAL HIGH (ref 1.2–2.2)
BILIRUBIN TOTAL: 0.2 mg/dL (ref 0.0–1.2)
BUN / CREAT RATIO: 12 (ref 12–28)
BUN: 13 mg/dL (ref 8–27)
CHLORIDE: 101 mmol/L (ref 96–106)
CO2: 23 mmol/L (ref 18–29)
CREATININE: 1.12 mg/dL — AB (ref 0.57–1.00)
Calcium: 9.6 mg/dL (ref 8.7–10.3)
GFR calc Af Amer: 61 mL/min/{1.73_m2} (ref >59)
GFR calc non Af Amer: 53 mL/min/{1.73_m2} — ABNORMAL LOW (ref >59)
GLUCOSE: 82 mg/dL (ref 65–99)
Globulin, Total: 2 g/dL (ref 1.5–4.5)
Potassium: 4.9 mmol/L (ref 3.5–5.2)
Sodium: 142 mmol/L (ref 134–144)
Total Protein: 6.5 g/dL (ref 6.0–8.5)

## 2015-09-07 LAB — LIPID PANEL W/O CHOL/HDL RATIO
CHOLESTEROL TOTAL: 178 mg/dL (ref 100–199)
HDL: 83 mg/dL (ref >39)
LDL Calculated: 76 mg/dL (ref 0–99)
TRIGLYCERIDES: 95 mg/dL (ref 0–149)
VLDL Cholesterol Cal: 19 mg/dL (ref 5–40)

## 2015-09-07 LAB — TSH: TSH: 3.21 u[IU]/mL (ref 0.450–4.500)

## 2015-09-07 LAB — HEPATITIS C ANTIBODY

## 2015-09-18 ENCOUNTER — Other Ambulatory Visit: Payer: Self-pay | Admitting: Unknown Physician Specialty

## 2015-09-21 ENCOUNTER — Other Ambulatory Visit: Payer: Self-pay | Admitting: Pain Medicine

## 2015-09-21 ENCOUNTER — Ambulatory Visit: Payer: Medicare Other | Attending: Pain Medicine | Admitting: Pain Medicine

## 2015-09-21 ENCOUNTER — Encounter: Payer: Self-pay | Admitting: Pain Medicine

## 2015-09-21 VITALS — BP 145/91 | HR 93 | Temp 97.1°F | Resp 18 | Ht 64.0 in | Wt 160.0 lb

## 2015-09-21 DIAGNOSIS — M5137 Other intervertebral disc degeneration, lumbosacral region: Secondary | ICD-10-CM | POA: Diagnosis not present

## 2015-09-21 DIAGNOSIS — M5416 Radiculopathy, lumbar region: Secondary | ICD-10-CM | POA: Diagnosis not present

## 2015-09-21 DIAGNOSIS — G5600 Carpal tunnel syndrome, unspecified upper limb: Secondary | ICD-10-CM | POA: Insufficient documentation

## 2015-09-21 DIAGNOSIS — M47817 Spondylosis without myelopathy or radiculopathy, lumbosacral region: Secondary | ICD-10-CM | POA: Diagnosis not present

## 2015-09-21 DIAGNOSIS — M5126 Other intervertebral disc displacement, lumbar region: Secondary | ICD-10-CM | POA: Insufficient documentation

## 2015-09-21 DIAGNOSIS — M5136 Other intervertebral disc degeneration, lumbar region: Secondary | ICD-10-CM | POA: Diagnosis not present

## 2015-09-21 DIAGNOSIS — M791 Myalgia: Secondary | ICD-10-CM | POA: Diagnosis not present

## 2015-09-21 DIAGNOSIS — M4186 Other forms of scoliosis, lumbar region: Secondary | ICD-10-CM | POA: Insufficient documentation

## 2015-09-21 DIAGNOSIS — M533 Sacrococcygeal disorders, not elsewhere classified: Secondary | ICD-10-CM | POA: Diagnosis not present

## 2015-09-21 DIAGNOSIS — M87051 Idiopathic aseptic necrosis of right femur: Secondary | ICD-10-CM

## 2015-09-21 DIAGNOSIS — I70209 Unspecified atherosclerosis of native arteries of extremities, unspecified extremity: Secondary | ICD-10-CM

## 2015-09-21 DIAGNOSIS — Z96643 Presence of artificial hip joint, bilateral: Secondary | ICD-10-CM | POA: Diagnosis not present

## 2015-09-21 DIAGNOSIS — M545 Low back pain: Secondary | ICD-10-CM | POA: Diagnosis present

## 2015-09-21 DIAGNOSIS — M87052 Idiopathic aseptic necrosis of left femur: Secondary | ICD-10-CM

## 2015-09-21 DIAGNOSIS — M79606 Pain in leg, unspecified: Secondary | ICD-10-CM | POA: Diagnosis present

## 2015-09-21 MED ORDER — OXYMORPHONE HCL ER 20 MG PO T12A: EXTENDED_RELEASE_TABLET | ORAL | 0 refills | Status: DC

## 2015-09-21 MED ORDER — DICLOFENAC SODIUM 1 % TD GEL: TRANSDERMAL | 0 refills | Status: DC

## 2015-09-21 MED ORDER — OXYCODONE-ACETAMINOPHEN 10-325 MG PO TABS: ORAL_TABLET | ORAL | 0 refills | Status: DC

## 2015-09-21 NOTE — Progress Notes (Signed)
   The patient is a 60 year old female who returns to pain management for further evaluation and treatment of pain involving the lumbar lower extremity region. The patient is with history of avascular necrosis of the hip with prior total hip replacement. The patient also has had the complaint of cold sensation of the lower extremities. The patient has had some improvement of her cold sensation of the lower extremities with lumbar sympathetic blocks. At the present time we will consider proceeding with lumbar sympathetic block at time return appointment in attempt to decrease the this disturbing paresthesias of the lower extremity more significantly. The patient will continue medications consisting of a panel ER with oxycodone acetaminophen breakthrough pain and we will consider additional modifications of treatment regimen pending follow-up evaluations. May consider replacing Opana with pain of the medication. We have discussed other medications including buprenorphine. Was with understanding and agreement suggested treatment plan    Physical examination  There was tenderness of the splenius capitis and occipitalis region a mild degree with mild tenderness of the acromioclavicular and glenohumeral joint regions. The patient appeared to be with bilaterally equal grip strength without increased pain with Tinel and Phalen's maneuver. Cervical facet cervical paraspinal musculature region and thoracic facet thoracic paraspinal musculature region reproduced mild discomfort. There was tenderness over the thoracic region with no crepitus of the thoracic region noted. Palpation over the lumbar paraspinal musculatures and lumbar facet region associated with moderate discomfort with lateral bending rotation extension and palpation over the lumbar facets reproducing moderate discomfort. There was tenderness of the PSIS and PII S region a moderate degree. Palpation along the greater trochanteric region iliotibial band  region reproduced moderate discomfort. EHL strength appeared to be slightly decreased. There was questionably decreased temperature of the lower extremities. There was no evidence of newly formed lesions of the skin in the lower extremity is noted. There was negative clonus negative Homans. EHL strength appeared to be decreased. No definite sensory deficit of dermatomal distribution detected. Abdomen was nontender with no costovertebral tenderness noted    Assessment     Complex regional pain syndrome of lower extremities versus vascular compromise  Degenerative disc disease lumbar spine Convex left scoliosis. Degenerative disc disease with facet hypertrophy at multiple levels involving L4-5 and L5-S1 predominantly. Mild disc bulging at L1-2 L4-5.  Lumbar facet syndrome  Sacroiliac joint dysfunction  Status post bilateral total hip replacement  Carpal tunnel syndrome     PLAN   Continue present medications Cymbalta  Voltaren gel oxycodone acetaminophen oxymorphone extended release . PATIENT STOPPED NEURONTIN. Patient did not tolerate Neurontin  Prescribe azithromycin (Z-Pak) instead of Cipro for procedures  Lumbar sympathetic block to be performed at time of return appointment  F/U PCP C Wicker for evaliation of  BP and general medical  condition as discussed.  F/U surgical evaluation. May consider pending follow-up evaluations  Vascular evaluation of lower extremities as discussed  F/U neurological evaluation. May conside PNCV/EMG studies and other studies r pending follow-up evaluations  May consider radiofrequency rhizolysis or intraspinal procedures pending response to present treatment and F/U evaluation   Patient to call Pain Management Center should patient have concerns prior to scheduled return appointment

## 2015-09-21 NOTE — Patient Instructions (Addendum)
PLAN   Continue present medications Cymbalta  Voltaren gel oxycodone acetaminophen oxymorphone extended release . PATIENT STOPPED NEURONTIN. Patient did not tolerate Neurontin  Prescribe azithromycin (Z-Pak) instead of Cipro for procedures  Lumbar sympathetic block to be performed at time of return appointment  F/U PCP C Wicker for evaliation of  BP and general medical  condition as discussed.  F/U surgical evaluation. May consider pending follow-up evaluations  Vascular evaluation of lower extremities as discussed  F/U neurological evaluation. May conside PNCV/EMG studies and other studies r pending follow-up evaluations  May consider radiofrequency rhizolysis or intraspinal procedures pending response to present treatment and F/U evaluation   Patient to call Pain Management Center should patient have concerns prior to scheduled return appointment

## 2015-09-25 ENCOUNTER — Ambulatory Visit: Payer: Medicare Other | Attending: Pain Medicine | Admitting: Pain Medicine

## 2015-09-25 ENCOUNTER — Encounter: Payer: Self-pay | Admitting: Pain Medicine

## 2015-09-25 VITALS — BP 142/93 | HR 97 | Temp 97.3°F | Resp 15 | Ht 64.0 in | Wt 160.0 lb

## 2015-09-25 DIAGNOSIS — M533 Sacrococcygeal disorders, not elsewhere classified: Secondary | ICD-10-CM

## 2015-09-25 DIAGNOSIS — M87052 Idiopathic aseptic necrosis of left femur: Secondary | ICD-10-CM

## 2015-09-25 DIAGNOSIS — Z96643 Presence of artificial hip joint, bilateral: Secondary | ICD-10-CM

## 2015-09-25 DIAGNOSIS — M87852 Other osteonecrosis, left femur: Secondary | ICD-10-CM | POA: Diagnosis not present

## 2015-09-25 DIAGNOSIS — M87051 Idiopathic aseptic necrosis of right femur: Secondary | ICD-10-CM

## 2015-09-25 DIAGNOSIS — M87851 Other osteonecrosis, right femur: Secondary | ICD-10-CM | POA: Diagnosis not present

## 2015-09-25 DIAGNOSIS — G90523 Complex regional pain syndrome I of lower limb, bilateral: Secondary | ICD-10-CM

## 2015-09-25 DIAGNOSIS — M79604 Pain in right leg: Secondary | ICD-10-CM | POA: Diagnosis present

## 2015-09-25 DIAGNOSIS — M5481 Occipital neuralgia: Secondary | ICD-10-CM

## 2015-09-25 DIAGNOSIS — M5136 Other intervertebral disc degeneration, lumbar region: Secondary | ICD-10-CM

## 2015-09-25 DIAGNOSIS — I739 Peripheral vascular disease, unspecified: Secondary | ICD-10-CM | POA: Insufficient documentation

## 2015-09-25 DIAGNOSIS — I70209 Unspecified atherosclerosis of native arteries of extremities, unspecified extremity: Secondary | ICD-10-CM

## 2015-09-25 DIAGNOSIS — M791 Myalgia: Secondary | ICD-10-CM | POA: Diagnosis not present

## 2015-09-25 DIAGNOSIS — M5416 Radiculopathy, lumbar region: Secondary | ICD-10-CM | POA: Diagnosis not present

## 2015-09-25 DIAGNOSIS — M47816 Spondylosis without myelopathy or radiculopathy, lumbar region: Secondary | ICD-10-CM

## 2015-09-25 DIAGNOSIS — M47817 Spondylosis without myelopathy or radiculopathy, lumbosacral region: Secondary | ICD-10-CM | POA: Diagnosis not present

## 2015-09-25 DIAGNOSIS — M79605 Pain in left leg: Secondary | ICD-10-CM | POA: Diagnosis present

## 2015-09-25 MED ORDER — CIPROFLOXACIN HCL 250 MG PO TABS: 250.0000 mg | ORAL_TABLET | Freq: Two times a day (BID) | ORAL | 0 refills | Status: DC

## 2015-09-25 MED ORDER — AZITHROMYCIN 250 MG PO TABS: ORAL_TABLET | ORAL | 0 refills | Status: DC

## 2015-09-25 MED ORDER — MIDAZOLAM HCL 5 MG/5ML IJ SOLN
5.0000 mg | Freq: Once | INTRAMUSCULAR | Status: DC
  Filled 2015-09-25: qty 5

## 2015-09-25 MED ORDER — ORPHENADRINE CITRATE 30 MG/ML IJ SOLN
60.0000 mg | Freq: Once | INTRAMUSCULAR | Status: AC
  Administered 2015-09-25: 60 mg via INTRAMUSCULAR
  Filled 2015-09-25: qty 2

## 2015-09-25 MED ORDER — BUPIVACAINE HCL (PF) 0.5 % IJ SOLN
30.0000 mL | Freq: Once | INTRAMUSCULAR | Status: AC
  Administered 2015-09-25: 30 mL
  Filled 2015-09-25: qty 30

## 2015-09-25 MED ORDER — FENTANYL CITRATE (PF) 100 MCG/2ML IJ SOLN
100.0000 ug | Freq: Once | INTRAMUSCULAR | Status: AC
  Administered 2015-09-25: 100 ug via INTRAVENOUS
  Filled 2015-09-25: qty 2

## 2015-09-25 MED ORDER — LIDOCAINE HCL (PF) 1 % IJ SOLN
10.0000 mL | Freq: Once | INTRAMUSCULAR | Status: AC
  Administered 2015-09-25: 10 mL via SUBCUTANEOUS
  Filled 2015-09-25: qty 10

## 2015-09-25 MED ORDER — LACTATED RINGERS IV SOLN: 1000.0000 mL | INTRAVENOUS | Status: DC

## 2015-09-25 MED ORDER — TRIAMCINOLONE ACETONIDE 40 MG/ML IJ SUSP
40.0000 mg | Freq: Once | INTRAMUSCULAR | Status: AC
  Administered 2015-09-25: 40 mg
  Filled 2015-09-25: qty 1

## 2015-09-25 NOTE — Progress Notes (Signed)
Patient here today for procedure visit.   Safety precautions to be maintained throughout the outpatient stay will include: orient to surroundings, keep bed in low position, maintain call bell within reach at all times, provide assistance with transfer out of bed and ambulation.

## 2015-09-25 NOTE — Progress Notes (Signed)
PROCEDURE PERFORMED: Left Lumbar sympathetic block.  HISTORY OF PRESENT ILLNESS: The patient is 60 y.o. female who returns to [facility] for further evaluation and treatment of pain involving the lower extremities. The patient is with history of avascular necrosis of the hips and is status post total hip replacements. The patient states that she has burning stinging throbbing pain of the lower extremities associated with cold sensation of the lower extremities. There is concern regarding the patient's pain being due to peripheral vascular disease as well as component of complex regional pain syndrome. . The risks, benefits, and expectations of the procedure were discussed and explained to the patient who was understanding and wished to proceed with lumbar sympathetic block as planned.   DESCRIPTION OF PROCEDURE: Lumbar sympathetic block with IV Versed, IV fentanyl conscious sedation, EKG, blood pressure, pulse, capnography, and pulse oximetry monitoring. The procedure was performed with the patient in prone position under fluoroscopic guidance.   NEEDLE PLACEMENT AT L2, left side lumbar sympathetic block: With the patient in prone position and oblique orientation of 20 degrees, Betadine prep and local anesthetic skin wheal of 1.5% lidocaine plain was prepared at the proposed needle entry site. Under fluoroscopic guidance with 20 degrees oblique orientation, the 22 -gauge needle was inserted at the lateral border of the L2 vertebral body on the left side.   NEEDLE PLACEMENT AT L3, left side lumbar sympathetic block: With the patient in the prone position and oblique orientation of 20 degrees, Betadine prep and local anesthetic skin wheal of 1.5% lidocaine plain was prepared at the proposed needle entry site. Under fluoroscopic guidance with 20 degrees  oblique orientation, the 22 -gauge needle was inserted at the lateral border of the L3 vertebral body on the left side.   NEEDLE PLACEMENT  AT L4, left side lumbar sympathetic block: With the patient in prone position and oblique orientation of 20 degrees, Betadine prep and local anesthetic skin wheal of 1.5% lidocaine plain was prepared at the proposed needle entry site. Under fluoroscopic guidance with 20 degrees  oblique orientation, the 22 -gauge needle was inserted at the lateral border of the L4 vertebral body on the left side.    Following needle placement at the L2, L3 and L4 vertebral body levels on the left side, needle placement was then verified on lateral view with tip of the needle documented to be in the anterior third of the vertebral body of L2, L3 and L4 respectively. Following negative aspiration of each needle for heme and CSF, L2 vertebral body level needle was injected with 10 mL of 0.25% bupivacaine. L3 vertebral body level needle was injected with 10 mL of 0.25% bupivacaine. L4 vertebral body level was injected with 10 mL of 0.5% bupivacaine. Needles were removed. Please note temperature readings prior to lumbar sympathetic block were noted to be 81.9 degrees Fahrenheit and following completion of the lumbar sympathetic block temperature readings of the lower extremity were noted to be 88.1 degrees Fahrenheit.    Myoneural block injections of the lumbar region  Following Betadine prep of proposed entry site a 22-gauge needle was inserted into the lumbar paraspinal musculature region and following negative aspiration 4 cc of 0.5% bupivacaine with Norflex was injected for myoneural block injection of the lumbar spinal musculature region times two.   The patient tolerated the procedure well.  PLAN:   1. Medications: We will continue presently prescribed medications oxycodone acetaminophen and Opana ER   at this time. 2. The patient is  to follow-up with primary care physician  C Wickerfor further evaluation of blood pressure and general medical condition as discussed. 3. Surgical evaluation as discussed. Has been  addressed  4. Neurological evaluation as discussed. May consider PNCV EMG studies and other studies 5.  Vascular evaluation has been addressed 6. The patient may be candidate for radiofrequency procedures, implantation       devices and other treatment   . 7. The patient has been advised to adhere to proper body mechanics. 8. The patient has been advised to call the Pain Management Center prior to scheduled return appointment should there be significant change in condition or have other concerns regarding condition prior to scheduled return appointment.   The patient was understanding and in agreement with suggested treatment plan.

## 2015-09-25 NOTE — Patient Instructions (Addendum)
PLAN  Continue present medications Cymbalta Voltaren gel oxycodone acetaminophen oxymorphone extended release Please obtain Z-Pak antibiotic today and begin taking Z-Pak antibiotic as prescribed .  F/U PCP C Wicker for evaliation of  BP and general medical  condition as discussed.   F/U surgical evaluation. May consider pending follow-up evaluations  F/U neurological evaluation. May consider further evaluation of headache with neurological consultation pending response to procedure performed today and follow-up evaluations  May consider radiofrequency rhizolysis or intraspinal procedures pending response to present treatment and F/U evaluation   Patient to call Pain Management Center should patient have concerns prior to scheduled return appointment.  Sympathetic Nerve Block A sympathetic nerve block is a procedure done to find out if your sympathetic nerves are damaged. You may also have this procedure to relieve pain from damaged sympathetic nerves. Sympathetic nerves leave your spinal cord and come together in a clump of nerves (ganglion). Your sympathetic nerves control some involuntary functions of your body. These include sweating, blood flow, and the widening and narrowing of blood vessels. When these nerves are damaged, they may send impulses to the muscles and cause them to contract. Eventually this can lead to long-standing (chronic) pain.  During a sympathetic nerve block, medicine is used to numb your sympathetic nerves. If your pain is caused by damaged sympathetic nerves, the pain will go away. The pain usually returns when the medicine wears off, but sometimes pain relief continues even after the medicine has worn off. You may have this procedure regularly to give you prolonged periods of pain relief. The injection site of the medicine depends on where your pain is. If you have:  Upper body pain, you may have an injection in your neck to numb the ganglion in that area (stellate  ganglion block).  Abdominal pain, you may have an injection in the middle of your back to numb the ganglion in that area (celiac plexus block).  Lower body pain, you may have an injection in your lower back to numb the ganglion in that area (lumbar sympathetic block). LET [facility] CARE PROVIDER KNOW ABOUT:  Any allergies you have.  All medicines you are taking, including vitamins, herbs, eye drops, creams, and over-the-counter medicines.  Previous problems you or members of your family have had with the use of anesthetics.  Any blood disorders you have.  Previous surgeries you have had.  Medical conditions you have. RISKS AND COMPLICATIONS Generally, this is a safe procedure. However, as with any procedure, problems can occur. Possible problems include:  Failure of the procedure to relieve your pain.  Worse pain (usually temporary).  Bleeding.  Infection.  Blood vessel damage.  Nerve damage. BEFORE THE PROCEDURE  You may meet with your health care provider or a pain management specialist.  Do not eat or drink anything after midnight on the night before the procedure or as directed by your health care provider.  Plan to have someone take you home after the procedure. PROCEDURE   Your health care provider will insert an IV tube in a vein.  You may be given a medicine that makes you relax and go to sleep (general anesthetic).  Numbing medicine (local anesthetic) will be injected into the skin over the affected area.  The health care provider then inserts a needle into the numbed area. Imaging studies help your provider position the needle.  Usually X-ray guidance (fluoroscopy) is used.  Sometimes a CT scan is done instead.  Once the needle is in the proper  position, numbing medicine is injected into the nerves.  The needle and IV tube are removed. Small bandages may be placed over the insertion sites. AFTER THE PROCEDURE  You will stay in a recovery area  until the sedation wears off and your health care provider says you can go home.  You may notice redness in the areas of the body where medicine was injected.   This information is not intended to replace advice given to you by your health care provider. Make sure you discuss any questions you have with your health care provider.   Document Released: 08/11/2002 Document Revised: 02/09/2013 Document Reviewed: 01/12/2013 Elsevier Interactive Patient Education 2016 [employer]  Pain Management Discharge Instructions  General Discharge Instructions :  If you need to reach your doctor call: Monday-Friday 8:00 am - 4:00 pm at [phone] or toll free [phone].  After clinic hours [phone] to have operator reach doctor.  Bring all of your medication bottles to all your appointments in the pain clinic.  To cancel or reschedule your appointment with Pain Management please remember to call 24 hours in advance to avoid a fee.  Refer to the educational materials which you have been given on: General Risks, I had my Procedure. Discharge Instructions, Post Sedation.  Post Procedure Instructions:  The drugs you were given will stay in your system until tomorrow, so for the next 24 hours you should not drive, make any legal decisions or drink any alcoholic beverages.  You may eat anything you prefer, but it is better to start with liquids then soups and crackers, and gradually work up to solid foods.  Please notify your doctor immediately if you have any unusual bleeding, trouble breathing or pain that is not related to your normal pain.  Depending on the type of procedure that was done, some parts of your body may feel week and/or numb.  This usually clears up by tonight or the next day.  Walk with the use of an assistive device or accompanied by an adult for the 24 hours.  You may use ice on the affected area for the first 24 hours.  Put ice in a Ziploc bag and cover with a towel and  place against area 15 minutes on 15 minutes off.  You may switch to heat after 24 hours.

## 2015-09-26 ENCOUNTER — Telehealth: Payer: Self-pay | Admitting: *Deleted

## 2015-09-26 NOTE — Telephone Encounter (Signed)
Attempted to call patient for post procedure follow up. No answer.

## 2015-09-29 ENCOUNTER — Telehealth: Payer: Self-pay | Admitting: Pain Medicine
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
## 2015-10-03 ENCOUNTER — Ambulatory Visit: Payer: Medicare Other | Admitting: Pain Medicine

## 2015-10-03 DIAGNOSIS — I10 Essential (primary) hypertension: Secondary | ICD-10-CM | POA: Diagnosis not present

## 2015-10-03 DIAGNOSIS — M79609 Pain in unspecified limb: Secondary | ICD-10-CM | POA: Diagnosis not present

## 2015-10-03 DIAGNOSIS — E785 Hyperlipidemia, unspecified: Secondary | ICD-10-CM | POA: Diagnosis not present

## 2015-10-09 ENCOUNTER — Other Ambulatory Visit: Payer: Self-pay | Admitting: Unknown Physician Specialty

## 2015-10-09 NOTE — Telephone Encounter (Signed)
Your patient 

## 2015-10-11 DIAGNOSIS — I5032 Chronic diastolic (congestive) heart failure: Secondary | ICD-10-CM | POA: Diagnosis not present

## 2015-10-11 DIAGNOSIS — E782 Mixed hyperlipidemia: Secondary | ICD-10-CM | POA: Diagnosis not present

## 2015-10-11 DIAGNOSIS — I70209 Unspecified atherosclerosis of native arteries of extremities, unspecified extremity: Secondary | ICD-10-CM | POA: Diagnosis not present

## 2015-10-11 DIAGNOSIS — R0789 Other chest pain: Secondary | ICD-10-CM | POA: Diagnosis not present

## 2015-10-11 DIAGNOSIS — R0602 Shortness of breath: Secondary | ICD-10-CM | POA: Diagnosis not present

## 2015-10-13 ENCOUNTER — Telehealth: Payer: Self-pay | Admitting: Unknown Physician Specialty

## 2015-10-13 NOTE — Telephone Encounter (Signed)
Routing to provider for advice.

## 2015-10-13 NOTE — Telephone Encounter (Signed)
Pt has an appt for a stress test 11/14/15 and is due for a follow up here and would like to know if she should come in before or after her stress test.

## 2015-10-13 NOTE — Telephone Encounter (Signed)
Called and let patient know what Malachy Mood said. Patient stated she would call back to schedule.

## 2015-10-13 NOTE — Telephone Encounter (Signed)
I don't think it matters

## 2015-10-18 ENCOUNTER — Telehealth: Payer: Self-pay | Admitting: *Deleted

## 2015-10-26 ENCOUNTER — Ambulatory Visit: Payer: Medicare Other | Admitting: Pain Medicine

## 2015-10-30 ENCOUNTER — Ambulatory Visit: Payer: Medicare Other | Admitting: Pain Medicine

## 2015-10-30 ENCOUNTER — Ambulatory Visit: Payer: Medicare Other | Attending: Pain Medicine | Admitting: Pain Medicine

## 2015-10-30 ENCOUNTER — Encounter: Payer: Self-pay | Admitting: Pain Medicine

## 2015-10-30 VITALS — BP 123/74 | HR 87 | Temp 97.9°F | Resp 16 | Ht 64.0 in | Wt 155.0 lb

## 2015-10-30 DIAGNOSIS — M5481 Occipital neuralgia: Secondary | ICD-10-CM

## 2015-10-30 DIAGNOSIS — G90523 Complex regional pain syndrome I of lower limb, bilateral: Secondary | ICD-10-CM

## 2015-10-30 DIAGNOSIS — M791 Myalgia: Secondary | ICD-10-CM | POA: Diagnosis not present

## 2015-10-30 DIAGNOSIS — M545 Low back pain: Secondary | ICD-10-CM | POA: Diagnosis present

## 2015-10-30 DIAGNOSIS — M87052 Idiopathic aseptic necrosis of left femur: Secondary | ICD-10-CM

## 2015-10-30 DIAGNOSIS — M4186 Other forms of scoliosis, lumbar region: Secondary | ICD-10-CM | POA: Insufficient documentation

## 2015-10-30 DIAGNOSIS — Z96643 Presence of artificial hip joint, bilateral: Secondary | ICD-10-CM | POA: Insufficient documentation

## 2015-10-30 DIAGNOSIS — G5600 Carpal tunnel syndrome, unspecified upper limb: Secondary | ICD-10-CM | POA: Diagnosis not present

## 2015-10-30 DIAGNOSIS — M5136 Other intervertebral disc degeneration, lumbar region: Secondary | ICD-10-CM | POA: Diagnosis not present

## 2015-10-30 DIAGNOSIS — M47816 Spondylosis without myelopathy or radiculopathy, lumbar region: Secondary | ICD-10-CM

## 2015-10-30 DIAGNOSIS — M87051 Idiopathic aseptic necrosis of right femur: Secondary | ICD-10-CM

## 2015-10-30 DIAGNOSIS — M47817 Spondylosis without myelopathy or radiculopathy, lumbosacral region: Secondary | ICD-10-CM | POA: Diagnosis not present

## 2015-10-30 DIAGNOSIS — M5416 Radiculopathy, lumbar region: Secondary | ICD-10-CM | POA: Diagnosis not present

## 2015-10-30 DIAGNOSIS — M533 Sacrococcygeal disorders, not elsewhere classified: Secondary | ICD-10-CM | POA: Diagnosis not present

## 2015-10-30 MED ORDER — OXYMORPHONE HCL ER 20 MG PO T12A: EXTENDED_RELEASE_TABLET | ORAL | 0 refills | Status: DC

## 2015-10-30 MED ORDER — OXYCODONE-ACETAMINOPHEN 10-325 MG PO TABS: ORAL_TABLET | ORAL | 0 refills | Status: DC

## 2015-10-30 NOTE — Progress Notes (Signed)
Safety precautions to be maintained throughout the outpatient stay will include: orient to surroundings, keep bed in low position, maintain call bell within reach at all times, provide assistance with transfer out of bed and ambulation.  

## 2015-10-30 NOTE — Patient Instructions (Signed)
PLAN   Continue present medications Cymbalta  Voltaren gel oxycodone acetaminophen oxymorphone extended release . PATIENT STOPPED NEURONTIN. Patient did not tolerate Neurontin  F/U PCP C Wicker for evaliation of  BP and general medical  condition as discussed.  F/U surgical evaluation. May consider pending follow-up evaluations  Vascular evaluation of lower extremities as discussed  F/U neurological evaluation. May conside PNCV/EMG studies and other studies r pending follow-up evaluations  May consider radiofrequency rhizolysis or intraspinal procedures pending response to present treatment and F/U evaluation   Patient to call Pain Management Center should patient have concerns prior to scheduled return appointment

## 2015-10-31 ENCOUNTER — Ambulatory Visit: Payer: Medicare Other | Admitting: Pain Medicine

## 2015-10-31 NOTE — Progress Notes (Signed)
     The patient is a 60 year old female who returns to pain management for further evaluation and treatment of pain involving the lower back lower extremity regions with prior total hip replacement. Has been concern regarding underlying vascular abnormalities contributing to pain and cold sensation of the lower extremities in addition to intraspinal abnormalities and abnormalities of the hip contributing to patient's lumbar lower extremity pain. At the present time we will continue presently prescribed medications and we will remain available to consider modifications of medications and other aspects of treatment regimen pending follow-up evaluation and reassessment the patient. The patient denies any trauma change in events of daily living cause significant change in symptomatology. The patient was unable to tolerate Neurontin and we'll continue Opana ER with oxycodone acetaminophen for breakthrough pain. All agreed to suggested treatment plan.    Physical examination  There was tenderness of the splenius capitis and occipitalis region a mild degree with mild tenderness of the cervical and thoracic facet region. The patient was at unremarkable Spurling's maneuver and Tinel and Phalen's maneuver were without increased pain of significant degree and patient was with bilaterally equal grip strength. Palpation of the thoracic region was a tennis to palpation without crepitus of the thoracic region noted. Palpation over the lumbar paraspinal musculature region lumbar facet region was of increased pain with lateral bending rotation extension and palpation of the lumbar facet region there was tenderness over the PSIS and PII S region a moderate degree with moderate tenderness of the greater trochanteric region iliotibial band region. Straight leg raising limited to 20 without increased pain with dorsiflexion noted. There was questionably decreased temperature sensation of the lower extremities noted. Pulses  were palpable and pain. There was negative clonus negative Homans and no sensory deficit or dermatomal distribution detected. Abdomen nontender with no costovertebral tenderness noted     Assessment   Complex regional pain syndrome of lower extremities versus vascular compromise  Degenerative disc disease lumbar spine Convex left scoliosis. Degenerative disc disease with facet hypertrophy at multiple levels involving L4-5 and L5-S1 predominantly. Mild disc bulging at L1-2 L4-5.  Lumbar facet syndrome  Sacroiliac joint dysfunction  Status post bilateral total hip replacement  Carpal tunnel syndrome      PLAN   Continue present medications Cymbalta  Voltaren gel oxycodone acetaminophen oxymorphone extended release . PATIENT STOPPED NEURONTIN. Patient did not tolerate Neurontin  F/U PCP C Wicker for evaliation of  BP and general medical  condition as discussed.  F/U surgical evaluation. May consider pending follow-up evaluations  Vascular evaluation of lower extremities as discussed  F/U neurological evaluation. May conside PNCV/EMG studies and other studies r pending follow-up evaluations  May consider radiofrequency rhizolysis or intraspinal procedures pending response to present treatment and F/U evaluation   Patient to call [facility] should patient have concerns prior to scheduled return appointment

## 2015-11-08 ENCOUNTER — Ambulatory Visit: Payer: Medicare Other | Admitting: Unknown Physician Specialty

## 2015-11-09 DIAGNOSIS — E785 Hyperlipidemia, unspecified: Secondary | ICD-10-CM | POA: Diagnosis not present

## 2015-11-09 DIAGNOSIS — M79609 Pain in unspecified limb: Secondary | ICD-10-CM | POA: Diagnosis not present

## 2015-11-09 DIAGNOSIS — I10 Essential (primary) hypertension: Secondary | ICD-10-CM | POA: Diagnosis not present

## 2015-11-09 DIAGNOSIS — I739 Peripheral vascular disease, unspecified: Secondary | ICD-10-CM | POA: Diagnosis not present

## 2015-11-14 ENCOUNTER — Ambulatory Visit: Payer: Medicare Other | Admitting: Unknown Physician Specialty

## 2015-11-14 DIAGNOSIS — R0602 Shortness of breath: Secondary | ICD-10-CM | POA: Diagnosis not present

## 2015-11-14 DIAGNOSIS — R0789 Other chest pain: Secondary | ICD-10-CM | POA: Diagnosis not present

## 2015-11-22 DIAGNOSIS — N184 Chronic kidney disease, stage 4 (severe): Secondary | ICD-10-CM | POA: Diagnosis not present

## 2015-11-22 DIAGNOSIS — I25118 Atherosclerotic heart disease of native coronary artery with other forms of angina pectoris: Secondary | ICD-10-CM | POA: Diagnosis not present

## 2015-11-22 DIAGNOSIS — Z23 Encounter for immunization: Secondary | ICD-10-CM | POA: Diagnosis not present

## 2015-11-22 DIAGNOSIS — E782 Mixed hyperlipidemia: Secondary | ICD-10-CM | POA: Diagnosis not present

## 2015-11-22 DIAGNOSIS — I110 Hypertensive heart disease with heart failure: Secondary | ICD-10-CM | POA: Diagnosis not present

## 2015-11-28 ENCOUNTER — Other Ambulatory Visit: Payer: Self-pay | Admitting: Pain Medicine

## 2015-11-28 DIAGNOSIS — M5416 Radiculopathy, lumbar region: Secondary | ICD-10-CM | POA: Diagnosis not present

## 2015-11-28 DIAGNOSIS — M533 Sacrococcygeal disorders, not elsewhere classified: Secondary | ICD-10-CM | POA: Diagnosis not present

## 2015-11-28 DIAGNOSIS — M791 Myalgia: Secondary | ICD-10-CM | POA: Diagnosis not present

## 2015-11-28 DIAGNOSIS — M47817 Spondylosis without myelopathy or radiculopathy, lumbosacral region: Secondary | ICD-10-CM | POA: Diagnosis not present

## 2015-12-01 ENCOUNTER — Encounter: Payer: Self-pay | Admitting: *Deleted

## 2015-12-01 ENCOUNTER — Encounter
Admission: RE | Disposition: A | Discharge status: Home / Self Care | Payer: Self-pay | Source: Ambulatory Visit | Attending: Cardiology

## 2015-12-01 ENCOUNTER — Ambulatory Visit
Admission: RE | Admit: 2015-12-01 | Discharge: 2015-12-01 | Disposition: A | Discharge status: Home / Self Care | Payer: Medicare Other | Source: Ambulatory Visit | Attending: Cardiology | Admitting: Cardiology

## 2015-12-01 DIAGNOSIS — F329 Major depressive disorder, single episode, unspecified: Secondary | ICD-10-CM | POA: Diagnosis not present

## 2015-12-01 DIAGNOSIS — F419 Anxiety disorder, unspecified: Secondary | ICD-10-CM | POA: Insufficient documentation

## 2015-12-01 DIAGNOSIS — I5032 Chronic diastolic (congestive) heart failure: Secondary | ICD-10-CM | POA: Diagnosis not present

## 2015-12-01 DIAGNOSIS — Z87891 Personal history of nicotine dependence: Secondary | ICD-10-CM | POA: Insufficient documentation

## 2015-12-01 DIAGNOSIS — I70209 Unspecified atherosclerosis of native arteries of extremities, unspecified extremity: Secondary | ICD-10-CM | POA: Diagnosis not present

## 2015-12-01 DIAGNOSIS — Z96643 Presence of artificial hip joint, bilateral: Secondary | ICD-10-CM | POA: Diagnosis not present

## 2015-12-01 DIAGNOSIS — R079 Chest pain, unspecified: Secondary | ICD-10-CM | POA: Diagnosis not present

## 2015-12-01 DIAGNOSIS — E782 Mixed hyperlipidemia: Secondary | ICD-10-CM | POA: Diagnosis not present

## 2015-12-01 DIAGNOSIS — I2510 Atherosclerotic heart disease of native coronary artery without angina pectoris: Secondary | ICD-10-CM | POA: Diagnosis not present

## 2015-12-01 DIAGNOSIS — I110 Hypertensive heart disease with heart failure: Secondary | ICD-10-CM | POA: Diagnosis not present

## 2015-12-01 HISTORY — PX: CARDIAC CATHETERIZATION: SHX172

## 2015-12-01 HISTORY — DX: Peripheral vascular disease, unspecified: I73.9

## 2015-12-01 SURGERY — LEFT HEART CATH AND CORONARY ANGIOGRAPHY
Anesthesia: Moderate Sedation | Laterality: Left

## 2015-12-01 MED ORDER — SODIUM CHLORIDE 0.9% FLUSH: 3.0000 mL | Freq: Two times a day (BID) | INTRAVENOUS | Status: DC

## 2015-12-01 MED ORDER — SODIUM CHLORIDE 0.9 % WEIGHT BASED INFUSION: 3.0000 mL/kg/h | INTRAVENOUS | Status: DC

## 2015-12-01 MED ORDER — SODIUM CHLORIDE 0.9 % IV SOLN: 250.0000 mL | INTRAVENOUS | Status: DC | PRN

## 2015-12-01 MED ORDER — ASPIRIN 81 MG PO CHEW: 81.0000 mg | CHEWABLE_TABLET | ORAL | Status: DC

## 2015-12-01 MED ORDER — SODIUM CHLORIDE 0.9% FLUSH: 3.0000 mL | INTRAVENOUS | Status: DC | PRN

## 2015-12-01 MED ORDER — FENTANYL CITRATE (PF) 100 MCG/2ML IJ SOLN
INTRAMUSCULAR | Status: AC
  Filled 2015-12-01: qty 2

## 2015-12-01 MED ORDER — MIDAZOLAM HCL 2 MG/2ML IJ SOLN
INTRAMUSCULAR | Status: DC | PRN
  Administered 2015-12-01 (×2): 1 mg via INTRAVENOUS

## 2015-12-01 MED ORDER — SODIUM CHLORIDE 0.9 % WEIGHT BASED INFUSION: 1.0000 mL/kg/h | INTRAVENOUS | Status: DC

## 2015-12-01 MED ORDER — IOPAMIDOL (ISOVUE-300) INJECTION 61%
INTRAVENOUS | Status: DC | PRN
  Administered 2015-12-01: 80 mL via INTRA_ARTERIAL

## 2015-12-01 MED ORDER — MIDAZOLAM HCL 2 MG/2ML IJ SOLN
INTRAMUSCULAR | Status: AC
  Filled 2015-12-01: qty 2

## 2015-12-01 MED ORDER — FENTANYL CITRATE (PF) 100 MCG/2ML IJ SOLN
INTRAMUSCULAR | Status: DC | PRN
  Administered 2015-12-01 (×2): 25 ug via INTRAVENOUS

## 2015-12-01 MED ORDER — HEPARIN (PORCINE) IN NACL 2-0.9 UNIT/ML-% IJ SOLN
INTRAMUSCULAR | Status: AC
  Filled 2015-12-01: qty 1000

## 2015-12-01 SURGICAL SUPPLY — 10 items
CATH 5FR JL4 DIAGNOSTIC (CATHETERS) ×1 IMPLANT
CATH 5FR PIGTAIL DIAGNOSTIC (CATHETERS) ×2 IMPLANT
CATH INFINITI JR4 5F (CATHETERS) ×2 IMPLANT
DEVICE CLOSURE MYNXGRIP 5F (Vascular Products) ×2 IMPLANT
KIT MANI 3VAL PERCEP (MISCELLANEOUS) ×3 IMPLANT
NDL PERC 18GX7CM (NEEDLE) IMPLANT
NEEDLE PERC 18GX7CM (NEEDLE) ×3 IMPLANT
PACK CARDIAC CATH (CUSTOM PROCEDURE TRAY) ×3 IMPLANT
SHEATH AVANTI 5FR X 11CM (SHEATH) ×2 IMPLANT
WIRE EMERALD 3MM-J .035X150CM (WIRE) ×2 IMPLANT

## 2015-12-01 NOTE — H&P (Signed)
Chief Complaint: Chief Complaint  Patient presents with  . Follow-up  echo and myoview  Date of Service: 11/22/2015 Date of Birth: Mar 20, 1955 PCP: Kathrine Haddock, NP  History of Present Illness: Amy Rocha is a 60 y.o.female patient who resents for evaluation. She has had intermittent episodes of chest pain and left arm pain for the past 2 weeks. Was seen in the emergency room 2 weeks ago for similar problems. Symptoms resolved since that time but have recurred. They occur with exertion and at rest. She is on Crestor for hyperlipidemia. She is on metoprolol XL at 50 mg daily. She denies orthopnea or PND. She states the symptoms are increasing somewhat. Patient underwent a functional study which showed no high-grade ischemia. She still has exertional chest pain radiating to her left arm and shoulder. This can occur with rest as well. Her echo showed no structural abnormalities. Given her persistent chest discomfort will need to further evaluate with a cardiac catheterization due to unremarkable ETT sestamibi but continued exertional and occasional rest pain with typical features of angina. Past Medical and Surgical History  Past Medical History Past Medical History:  Diagnosis Date  . Anxiety, unspecified  . Depression, unspecified  . Hypertension   Past Surgical History She has a past surgical history that includes bilateral hip replacement and Functional endoscopic sinus surgery.   Medications and Allergies  Current Medications  Current Outpatient Prescriptions  Medication Sig Dispense Refill  . amLODIPine (NORVASC) 5 MG tablet Take 1 tablet by mouth once daily.  Marland Kitchen atorvastatin (LIPITOR) 40 MG tablet Take 1 tablet by mouth once daily.  Marland Kitchen buPROPion (WELLBUTRIN XL) 300 MG XL tablet 1 tab by mouth daily  . DULoxetine (CYMBALTA) 60 MG DR capsule 1 cap by mouth daily  . fluticasone (FLONASE) 50 mcg/actuation nasal spray 6  . LORazepam (ATIVAN) 0.5 MG tablet 0.5 mg prn as needed  .  omeprazole (PRILOSEC) 20 MG DR capsule  . oxyCODONE (OXYCONTIN) 20 MG CR tablet 1 tab by mouth every 12 hours  . oxyMORphone (OPANA ER) 20 MG ER tablet Take 1 tablet by mouth every 12 (twelve) hours. 0  . valacyclovir (VALTREX) 500 MG tablet 500 mg prn as needed   No current facility-administered medications for this visit.   Allergies: Fluticasone-salmeterol; Lactose; and Tetracyclines  Social and Family History  Social History reports that she quit smoking about 19 years ago. Her smoking use included Cigarettes. She has never used smokeless tobacco. She reports that she drinks alcohol. She reports that she does not use illicit drugs.  Family History Family History  Problem Relation Age of Onset  . No Known Problems Mother  . No Known Problems Father   Review of Systems  Review of Systems  Constitutional: Negative for chills, diaphoresis, fever, malaise/fatigue and weight loss.  HENT: Negative for congestion, ear discharge, hearing loss and tinnitus.  Eyes: Negative for blurred vision.  Respiratory: Positive for shortness of breath. Negative for cough, hemoptysis, sputum production and wheezing.  Cardiovascular: Positive for chest pain. Negative for palpitations, orthopnea, claudication, leg swelling and PND.  Gastrointestinal: Negative for abdominal pain, blood in stool, constipation, diarrhea, heartburn, melena, nausea and vomiting.  Genitourinary: Negative for dysuria, frequency, hematuria and urgency.  Musculoskeletal: Negative for back pain, falls, joint pain and myalgias.  Skin: Negative for itching and rash.  Neurological: Negative for dizziness, tingling, focal weakness, loss of consciousness, weakness and headaches.  Endo/Heme/Allergies: Negative for polydipsia. Does not bruise/bleed easily.  Psychiatric/Behavioral: Negative for depression, memory loss  and substance abuse. The patient is nervous/anxious.   Physical Examination   Vitals:BP 130/64  Pulse 84  Resp 12  Ht  162.6 cm (5\' 4" )  Wt 70.8 kg (156 lb)  BMI 26.78 kg/m2 Ht:162.6 cm (5\' 4" ) Wt:70.8 kg (156 lb) BTD:HRCB surface area is 1.79 meters squared. Body mass index is 26.78 kg/(m^2).  Wt Readings from Last 3 Encounters:  11/22/15 70.8 kg (156 lb)  10/11/15 70.3 kg (155 lb)  07/08/14 74.8 kg (165 lb)   BP Readings from Last 3 Encounters:  11/22/15 130/64  10/11/15 132/78  07/08/14 124/82   General appearance appears in no acute distress  Head Mouth and Eye exam Normocephalic, without obvious abnormality, atraumatic Dentition is good Eyes appear anicteric   Neck exam Thyroid: normal  Nodes: no obvious adenopathy  LUNGS Breath Sounds: Normal Percussion: Normal  CARDIOVASCULAR JVP CV wave: no HJR: no Elevation at 90 degrees: None Carotid Pulse: normal pulsation bilaterally Bruit: None Apex: apical impulse normal  Auscultation Rhythm: normal sinus rhythm S1: normal S2: normal Clicks: no Rub: no Murmurs: no murmurs  Gallop: None ABDOMEN Liver enlargement: no Pulsatile aorta: no Ascites: no Bruits: no  EXTREMITIES Clubbing: no Edema: trace to 1+ bilateral pedal edema Pulses: peripheral pulses symmetrical Femoral Bruits: no Amputation: no SKIN Rash: no Cyanosis: no Embolic phemonenon: no Bruising: no NEURO Alert and Oriented to person, place and time: yes Non focal: yes  PSYCH: Pt appears to have normal affect  LABS REVIEWED  Lab Results  Component Value Date  CREATININE 1.2 (H) 05/17/2010  BUN 17 05/17/2010  NA 142 05/17/2010  K 4.9 05/17/2010  CL 110 (H) 05/17/2010  CO2 21 05/17/2010   Lab Results  Component Value Date  ALT 28 05/17/2010  AST 27 05/17/2010  ALKPHOS 73 05/17/2010   Diagnostic Studies Reviewed:  EKG EKG demonstrated normal sinus rhythm, nonspecific ST and T waves changes.  Assessment and Plan   60 y.o. female with  ICD-10-CM ICD-9-CM  1. Atypical chest pain-etiology of chest pain is unclear. Patient continues to  have exertional left-sided chest pain with radiation to her arm despite a negative functional study. Given this will need to proceed to left cardiac cath to evaluate coronary anatomy to guide further therapy, given patient's unstable angina picture.. R07.89 786.59   2. SOB (shortness of breath)-echo showed no structural abnormalities. Will need to evaluate for evidence of ischemic etiology. R06.02 786.05  3. Atherosclerotic peripheral vascular disease (CMS-HCC)-continue with current regimen including Crestor with an LDL goal of less than 100. I70.209 440.20  4. Chronic diastolic congestive heart failure (CMS-HCC)-echo showed normal EF I50.32 428.32  428.0  5. Mixed hyperlipidemia-continue a statin. LDL goal of less than 100. E78.2 272.2   Return in about 2 weeks (around 12/06/2015).  These notes generated with voice recognition software. I apologize for typographical errors.  Sydnee Levans, MD    H and P reviewed. No changes.

## 2015-12-01 NOTE — Discharge Instructions (Signed)

## 2015-12-04 ENCOUNTER — Emergency Department
Admission: EM | Admit: 2015-12-04 | Discharge: 2015-12-05 | Disposition: A | Discharge status: Home / Self Care | Payer: Medicare Other | Attending: Emergency Medicine | Admitting: Emergency Medicine

## 2015-12-04 ENCOUNTER — Encounter: Payer: Self-pay | Admitting: Emergency Medicine

## 2015-12-04 ENCOUNTER — Emergency Department: Payer: Medicare Other

## 2015-12-04 DIAGNOSIS — R1031 Right lower quadrant pain: Secondary | ICD-10-CM | POA: Diagnosis not present

## 2015-12-04 DIAGNOSIS — M79661 Pain in right lower leg: Secondary | ICD-10-CM | POA: Insufficient documentation

## 2015-12-04 DIAGNOSIS — I130 Hypertensive heart and chronic kidney disease with heart failure and stage 1 through stage 4 chronic kidney disease, or unspecified chronic kidney disease: Secondary | ICD-10-CM | POA: Diagnosis not present

## 2015-12-04 DIAGNOSIS — N184 Chronic kidney disease, stage 4 (severe): Secondary | ICD-10-CM | POA: Insufficient documentation

## 2015-12-04 DIAGNOSIS — Z87891 Personal history of nicotine dependence: Secondary | ICD-10-CM | POA: Diagnosis not present

## 2015-12-04 DIAGNOSIS — M79604 Pain in right leg: Secondary | ICD-10-CM

## 2015-12-04 DIAGNOSIS — I509 Heart failure, unspecified: Secondary | ICD-10-CM | POA: Diagnosis not present

## 2015-12-04 DIAGNOSIS — J449 Chronic obstructive pulmonary disease, unspecified: Secondary | ICD-10-CM | POA: Insufficient documentation

## 2015-12-04 DIAGNOSIS — Z0389 Encounter for observation for other suspected diseases and conditions ruled out: Secondary | ICD-10-CM | POA: Diagnosis not present

## 2015-12-04 DIAGNOSIS — I2510 Atherosclerotic heart disease of native coronary artery without angina pectoris: Secondary | ICD-10-CM | POA: Insufficient documentation

## 2015-12-04 DIAGNOSIS — Z79899 Other long term (current) drug therapy: Secondary | ICD-10-CM | POA: Diagnosis not present

## 2015-12-04 DIAGNOSIS — Z7951 Long term (current) use of inhaled steroids: Secondary | ICD-10-CM | POA: Diagnosis not present

## 2015-12-04 DIAGNOSIS — J45909 Unspecified asthma, uncomplicated: Secondary | ICD-10-CM | POA: Insufficient documentation

## 2015-12-04 DIAGNOSIS — Z791 Long term (current) use of non-steroidal anti-inflammatories (NSAID): Secondary | ICD-10-CM | POA: Insufficient documentation

## 2015-12-04 LAB — BASIC METABOLIC PANEL
Anion gap: 9 (ref 5–15)
BUN: 13 mg/dL (ref 6–20)
CHLORIDE: 101 mmol/L (ref 101–111)
CO2: 26 mmol/L (ref 22–32)
CREATININE: 1.11 mg/dL — AB (ref 0.44–1.00)
Calcium: 8.9 mg/dL (ref 8.9–10.3)
GFR calc Af Amer: >60 mL/min (ref >60)
GFR calc non Af Amer: 52 mL/min — ABNORMAL LOW (ref >60)
GLUCOSE: 144 mg/dL — AB (ref 65–99)
Potassium: 4.1 mmol/L (ref 3.5–5.1)
SODIUM: 136 mmol/L (ref 135–145)

## 2015-12-04 LAB — CBC
HCT: 42.5 % (ref 35.0–47.0)
HEMOGLOBIN: 14.2 g/dL (ref 12.0–16.0)
MCH: 30.7 pg (ref 26.0–34.0)
MCHC: 33.3 g/dL (ref 32.0–36.0)
MCV: 92.2 fL (ref 80.0–100.0)
PLATELETS: 325 10*3/uL (ref 150–440)
RBC: 4.61 MIL/uL (ref 3.80–5.20)
RDW: 14.5 % (ref 11.5–14.5)
WBC: 14.8 10*3/uL — ABNORMAL HIGH (ref 3.6–11.0)

## 2015-12-04 NOTE — ED Triage Notes (Signed)
Pt arrived to triage with slow gait due to pain in right leg. Pt reports she had a cardiac craterization on Thursday 11/30/15, since has had increasing pain to right femoral site, with radiating "shooting" pain down the right leg. Pt denies SOB, Chest pain or other symptoms at time of triage.

## 2015-12-05 ENCOUNTER — Emergency Department: Payer: Medicare Other

## 2015-12-05 DIAGNOSIS — R1031 Right lower quadrant pain: Secondary | ICD-10-CM | POA: Diagnosis not present

## 2015-12-05 DIAGNOSIS — Z0389 Encounter for observation for other suspected diseases and conditions ruled out: Secondary | ICD-10-CM | POA: Diagnosis not present

## 2015-12-05 MED ORDER — DIAZEPAM 2 MG PO TABS
2.0000 mg | ORAL_TABLET | Freq: Once | ORAL | Status: AC
  Administered 2015-12-05: 2 mg via ORAL
  Filled 2015-12-05: qty 1

## 2015-12-05 MED ORDER — OXYCODONE-ACETAMINOPHEN 5-325 MG PO TABS
2.0000 | ORAL_TABLET | Freq: Once | ORAL | Status: AC
  Administered 2015-12-05: 2 via ORAL
  Filled 2015-12-05: qty 2

## 2015-12-05 MED ORDER — ETODOLAC 200 MG PO CAPS: 200.0000 mg | ORAL_CAPSULE | Freq: Three times a day (TID) | ORAL | 0 refills | Status: DC

## 2015-12-05 NOTE — ED Provider Notes (Signed)
[facility] Emergency Department Provider Note   ____________________________________________   First MD Initiated Contact with Patient 12/05/15 [phone]     (approximate)  I have reviewed the triage vital signs and the nursing notes.   HISTORY  Chief Complaint Leg Pain    HPI Amy Rocha is a 60 y.o. female who comes into the hospital today with right groin pain. The patient had a cardiac catheterization last week and she reports that since Sunday she's been having some pain in her right groin. The patient takes Percocet regularly and goes to the pain clinic. The patient did not call her doctor today to discuss this with them. She denies any redness or swelling in the groin. She reports that the pain is worse when she is walking on it and is not as bad when she's not walking and staying off of it. The patient denies any falls but does have a history of hip surgeries. The patient rates her pain 8 out of 10 in intensity. She reports that she did have a temperature of 100 home. The patient denies any chest pain, shortness of breath, nausea, vomiting, abdominal pain or discoloration. The patient is here today for evaluation of her symptoms.   Past Medical History:  Diagnosis Date  . Anxiety   . Arthritis   . Asthma   . Avascular necrosis ([place])   . Blood transfusion without reported diagnosis   . Calculus of kidney   . CHF (congestive heart failure) ([place])   . Chronic pain syndrome   . COPD (chronic obstructive pulmonary disease) ([place])   . Depression   . GERD (gastroesophageal reflux disease)   . Heart failure ([place])   . Hyperlipidemia   . Hypertension   . IBS (irritable bowel syndrome)   . Joint pain   . Osteoporosis   . Oxygen deficiency   . Peripheral vascular disease ([place])   . Urge incontinence     Patient Active Problem List   Diagnosis Date Noted  . Atherosclerotic peripheral vascular disease ([place]) 09/21/2015  . Insomnia 09/06/2015  .  Complex regional pain syndrome of lower extremity 07/11/2015  . Bilateral occipital neuralgia 05/10/2015  . DDD (degenerative disc disease), lumbar 10/26/2014  . Facet syndrome, lumbar 10/26/2014  . History of total hip replacement 10/26/2014  . Avascular necrosis of bones of both hips ([place]) 10/26/2014  . Sacroiliac joint dysfunction 10/26/2014  . Calculus of kidney 09/08/2014  . Acute anxiety 09/08/2014  . GERD (gastroesophageal reflux disease) 09/08/2014  . Joint pain of leg 09/08/2014  . Urge incontinence 09/08/2014  . Hypertensive heart failure ([place]) 09/08/2014  . Avascular necrosis ([place]) 09/08/2014  . Anemia 09/08/2014  . Atherosclerosis of coronary artery 09/08/2014  . IBS (irritable bowel syndrome) 09/08/2014  . Asthma 09/08/2014  . CHF (congestive heart failure) ([place]) 09/08/2014  . Hyperlipidemia 09/08/2014  . COPD (chronic obstructive pulmonary disease) ([place]) 09/08/2014  . Depression 09/08/2014  . Hypertensive CKD (chronic kidney disease) 09/08/2014  . CKD (chronic kidney disease), stage IV ([place]) 09/08/2014  . Proteinuria 09/08/2014  . Chronic pain 09/08/2014    Past Surgical History:  Procedure Laterality Date  . ABDOMINAL HYSTERECTOMY    . avascular necrosis    . CARDIAC CATHETERIZATION Left 12/01/2015   Procedure: Left Heart Cath and Coronary Angiography;  Surgeon: Teodoro Spray, MD;  Location: [facility];  Rocha: Cardiovascular;  Laterality: Left;  . eyelid lift    . HERNIA REPAIR    . JOINT  REPLACEMENT     hip  . NASAL SINUS SURGERY      Prior to Admission medications   Medication Sig Start Date End Date Taking? Authorizing Provider  albuterol (PROVENTIL HFA;VENTOLIN HFA) 108 (90 Base) MCG/ACT inhaler Inhale 2 puffs into the lungs every 6 (six) hours as needed for wheezing or shortness of breath. 06/05/15   Kathrine Haddock, NP  amLODipine (NORVASC) 5 MG tablet Take 1 tablet (5 mg total) by mouth daily. 03/29/15   Kathrine Haddock, NP  atorvastatin  (LIPITOR) 40 MG tablet Take 1 tablet (40 mg total) by mouth daily. 03/29/15   Kathrine Haddock, NP  azithromycin (ZITHROMAX) 250 MG tablet Limit 2 tablets by mouth on day 1 then 1 tablet by mouth for the following 4 days 09/25/15   Mohammed Kindle, MD  budesonide-formoterol [facility]) 160-4.5 MCG/ACT inhaler Inhale 2 puffs into the lungs 2 (two) times daily. 06/05/15   Kathrine Haddock, NP  buPROPion (WELLBUTRIN XL) 300 MG 24 hr tablet TAKE 1 TABLET BY MOUTH ONCE DAILY. 01/06/15   Kathrine Haddock, NP  diclofenac sodium (VOLTAREN) 1 % GEL Apply a maximum of 2-4 g to painful area of skin 4 times per day as directed and if tolerated 09/21/15   Mohammed Kindle, MD  DULoxetine (CYMBALTA) 60 MG capsule TAKE 1 CAPSULE (60 MG TOTAL) BY MOUTH DAILY. 10/09/15   Kathrine Haddock, NP  etodolac (LODINE) 200 MG capsule Take 1 capsule (200 mg total) by mouth every 8 (eight) hours. 12/05/15   Loney Hering, MD  fluticasone (FLONASE) 50 MCG/ACT nasal spray Place 2 sprays into both nostrils daily. Patient taking differently: Place 2 sprays into both nostrils daily as needed.  06/05/15   Kathrine Haddock, NP  furosemide (LASIX) 40 MG tablet Take 40 mg by mouth as needed.     Historical Provider, MD  omeprazole (PRILOSEC) 20 MG capsule Take 20 mg by mouth daily.    Historical Provider, MD  oxyCODONE (OXYCONTIN) 20 mg 12 hr tablet Take 20 mg by mouth every 12 (twelve) hours.    Historical Provider, MD  oxyCODONE-acetaminophen (PERCOCET) 10-325 MG tablet Limit 3 - 5  tablets by mouth per day for breakthrough pain if tolerated while taking oxymorphone extended release 10/30/15   Mohammed Kindle, MD  valACYclovir (VALTREX) 1000 MG tablet TAKE 2 TABLETS BY MOUTH TWICE A DAY. 09/18/15   Kathrine Haddock, NP  valACYclovir (VALTREX) 500 MG tablet Take 500 mg by mouth daily as needed.    Historical Provider, MD    Allergies Advair diskus [fluticasone-salmeterol]; Lactose intolerance (gi); and Tetracyclines & related  Family History  Problem Relation  Age of Onset  . Dementia Mother   . Cancer Father     colon  . Arthritis Brother   . Hypertension Daughter   . Aneurysm Brother     Social History Social History  Substance Use Topics  . Smoking status: Former Research scientist (life sciences)  . Smokeless tobacco: Never Used  . Alcohol use No    Review of Systems Constitutional: No fever/chills Eyes: No visual changes. ENT: No sore throat. Cardiovascular: Denies chest pain. Respiratory: Denies shortness of breath. Gastrointestinal: No abdominal pain.  No nausea, no vomiting.  No diarrhea.  No constipation. Genitourinary: Negative for dysuria. Musculoskeletal: Right groin pain. Skin: Negative for rash. Neurological: Negative for headaches, focal weakness or numbness.  10-point ROS otherwise negative.  ____________________________________________   PHYSICAL EXAM:  VITAL SIGNS: ED Triage Vitals  Enc Vitals Group     BP 12/04/15 2333 116/73  Pulse Rate 12/04/15 2333 89     Resp 12/04/15 2333 14     Temp 12/04/15 2333 98.6 F (37 C)     Temp Source 12/04/15 2333 Oral     SpO2 12/04/15 2333 98 %     Weight 12/04/15 1959 152 lb (68.9 kg)     Height 12/04/15 1959 5\' 4"  (1.626 m)     Head Circumference --      Peak Flow --      Pain Score 12/04/15 1959 8     Pain Loc --      Pain Edu? --      Excl. in [place]? --     Constitutional: Alert and oriented. Well appearing and in Moderate distress. Eyes: Conjunctivae are normal. PERRL. EOMI. Head: Atraumatic. Nose: No congestion/rhinnorhea. Mouth/Throat: Mucous membranes are moist.  Oropharynx non-erythematous. Cardiovascular: Normal rate, regular rhythm. Grossly normal heart sounds.  Good peripheral circulation. Respiratory: Normal respiratory effort.  No retractions. Lungs CTAB. Gastrointestinal: Soft and nontender. No distention. Positive bowel sounds Musculoskeletal: No lower extremity tenderness nor edema. Right groin tenderness to palpation Neurologic:  Normal speech and language.  Skin:   Skin is warm, dry and intact. No erythema to groin no swelling or induration. Psychiatric: Mood and affect are normal. Speech and behavior are normal.  ____________________________________________   LABS (all labs ordered are listed, but only abnormal results are displayed)  Labs Reviewed  CBC - Abnormal; Notable for the following:       Result Value   WBC 14.8 (*)    All other components within normal limits  BASIC METABOLIC PANEL - Abnormal; Notable for the following:    Glucose, Bld 144 (*)    Creatinine, Ser 1.11 (*)    GFR calc non Af Amer 52 (*)    All other components within normal limits   ____________________________________________  EKG  None ____________________________________________  RADIOLOGY  Right lower extremity ultrasound Right groin ultrasound ____________________________________________   PROCEDURES  Procedure(s) performed: None  Procedures  Critical Care performed: No  ____________________________________________   INITIAL IMPRESSION / ASSESSMENT AND PLAN / ED COURSE  Pertinent labs & imaging results that were available during my care of the patient were reviewed by me and considered in my medical decision making (see chart for details).  This is a 60 year old female who comes into the hospital today with some right groin pain. After having a catheterization the concern is a possible pseudoaneurysm versus a clot. We will check some blood work. I will send the patient for an ultrasound of her right lower extremity as well as her groin looking for possible pseudoaneurysm versus a clot. The patient will be reassessed after the evaluation.  Clinical Course  Value Comment By Time  US Venous Img Lower Unilateral Right No evidence of deep venous thrombosis in the right lower extremity. Loney Hering, MD 10/16 2348  [place] Extrem Low Right Ltd Negative targeted ultrasound of the right groin. No evidence for pseudoaneurysm or other acute  abnormality identified.   Loney Hering, MD 10/17 [phone]   The patient had her ultrasound and was asking for something for pain. I did give the patient 2 Percocet as well as 2 mg of Valium orally. The patient's ultrasound does not show any signs of clot or a pseudoaneurysm. She also does not have any signs of infection or cellulitis. The patient will be discharged home to follow-up with her primary care physician and her cardiologist. I  explained to the patient that  there could be some inflammation causing some of this pain but she does need to follow-up. The patient otherwise has no further complaints or concerns. She'll be discharged home.  ____________________________________________   FINAL CLINICAL IMPRESSION(S) / ED DIAGNOSES  Final diagnoses:  Groin pain, right  Pain of right lower extremity      NEW MEDICATIONS STARTED DURING THIS VISIT:  Discharge Medication List as of 12/05/2015  2:39 AM    START taking these medications   Details  etodolac (LODINE) 200 MG capsule Take 1 capsule (200 mg total) by mouth every 8 (eight) hours., Starting Tue 12/05/2015, Print         Note:  This document was prepared using Dragon voice recognition software and may include unintentional dictation errors.    Loney Hering, MD 12/05/15 [phone]

## 2015-12-05 NOTE — ED Notes (Signed)
Pt at ultrasound

## 2015-12-06 ENCOUNTER — Other Ambulatory Visit: Payer: Self-pay | Admitting: Cardiology

## 2015-12-06 DIAGNOSIS — I729 Aneurysm of unspecified site: Secondary | ICD-10-CM

## 2015-12-09 ENCOUNTER — Other Ambulatory Visit: Payer: Self-pay | Admitting: Unknown Physician Specialty

## 2015-12-12 DIAGNOSIS — I110 Hypertensive heart disease with heart failure: Secondary | ICD-10-CM | POA: Diagnosis not present

## 2015-12-12 DIAGNOSIS — E782 Mixed hyperlipidemia: Secondary | ICD-10-CM | POA: Diagnosis not present

## 2015-12-12 DIAGNOSIS — I70209 Unspecified atherosclerosis of native arteries of extremities, unspecified extremity: Secondary | ICD-10-CM | POA: Diagnosis not present

## 2015-12-26 DIAGNOSIS — M5416 Radiculopathy, lumbar region: Secondary | ICD-10-CM | POA: Diagnosis not present

## 2015-12-26 DIAGNOSIS — M47817 Spondylosis without myelopathy or radiculopathy, lumbosacral region: Secondary | ICD-10-CM | POA: Diagnosis not present

## 2015-12-26 DIAGNOSIS — M533 Sacrococcygeal disorders, not elsewhere classified: Secondary | ICD-10-CM | POA: Diagnosis not present

## 2015-12-26 DIAGNOSIS — M791 Myalgia: Secondary | ICD-10-CM | POA: Diagnosis not present

## 2016-01-08 ENCOUNTER — Other Ambulatory Visit: Payer: Self-pay | Admitting: Unknown Physician Specialty

## 2016-01-25 ENCOUNTER — Other Ambulatory Visit: Payer: Self-pay | Admitting: Unknown Physician Specialty

## 2016-01-29 DIAGNOSIS — M791 Myalgia: Secondary | ICD-10-CM | POA: Diagnosis not present

## 2016-01-29 DIAGNOSIS — M5416 Radiculopathy, lumbar region: Secondary | ICD-10-CM | POA: Diagnosis not present

## 2016-01-29 DIAGNOSIS — M47817 Spondylosis without myelopathy or radiculopathy, lumbosacral region: Secondary | ICD-10-CM | POA: Diagnosis not present

## 2016-01-29 DIAGNOSIS — M533 Sacrococcygeal disorders, not elsewhere classified: Secondary | ICD-10-CM | POA: Diagnosis not present

## 2016-02-06 ENCOUNTER — Encounter: Payer: Self-pay | Admitting: Unknown Physician Specialty

## 2016-02-06 ENCOUNTER — Ambulatory Visit (INDEPENDENT_AMBULATORY_CARE_PROVIDER_SITE_OTHER): Payer: Medicare Other | Admitting: Unknown Physician Specialty

## 2016-02-06 VITALS — BP 134/90 | HR 111 | Temp 98.1°F | Wt 158.6 lb

## 2016-02-06 DIAGNOSIS — J0100 Acute maxillary sinusitis, unspecified: Secondary | ICD-10-CM

## 2016-02-06 DIAGNOSIS — I70209 Unspecified atherosclerosis of native arteries of extremities, unspecified extremity: Secondary | ICD-10-CM | POA: Diagnosis not present

## 2016-02-06 DIAGNOSIS — Q359 Cleft palate, unspecified: Secondary | ICD-10-CM | POA: Diagnosis not present

## 2016-02-06 DIAGNOSIS — H1013 Acute atopic conjunctivitis, bilateral: Secondary | ICD-10-CM

## 2016-02-06 MED ORDER — VALACYCLOVIR HCL 1 G PO TABS: 2000.0000 mg | ORAL_TABLET | Freq: Two times a day (BID) | ORAL | 12 refills | Status: DC

## 2016-02-06 MED ORDER — CROMOLYN SODIUM 4 % OP SOLN: 1.0000 [drp] | Freq: Four times a day (QID) | OPHTHALMIC | 12 refills | Status: DC

## 2016-02-06 MED ORDER — AMOXICILLIN-POT CLAVULANATE 875-125 MG PO TABS: 1.0000 | ORAL_TABLET | Freq: Two times a day (BID) | ORAL | 0 refills | Status: DC

## 2016-02-06 MED ORDER — PREDNISONE 20 MG PO TABS: 40.0000 mg | ORAL_TABLET | Freq: Every day | ORAL | 0 refills | Status: DC

## 2016-02-06 NOTE — Progress Notes (Signed)
BP 134/90 (BP Location: Left Arm, Patient Position: Sitting, Cuff Size: Normal)   Pulse (!) 111   Temp 98.1 F (36.7 C)   Wt 158 lb 9.6 oz (71.9 kg)   LMP 09/13/1975 (Approximate)   SpO2 95%   BMI 27.22 kg/m    Subjective:    Patient ID: Amy Rocha, female    DOB: [birth year], 60 y.o.   MRN: [id number]  HPI: Amy Rocha is a 60 y.o. female  Chief Complaint  Patient presents with  . URI    pt states she has been having sinus pressure, congestion, and watery eyes for about a month now    Sinusitis  This is a new problem. The current episode started more than 1 month ago. The problem has been gradually worsening since onset. There has been no fever. Associated symptoms include congestion, sinus pressure and sneezing. ("A hole in my nose that if I don't get Prednisone I will go crazy."  It seems she is talking about her lower septum  ) Past treatments include nothing.   Relevant past medical, surgical, family and social history reviewed and updated as indicated. Interim medical history since our last visit reviewed. Allergies and medications reviewed and updated.  Review of Systems  HENT: Positive for congestion, sinus pressure and sneezing.     Per HPI unless specifically indicated above     Objective:    BP 134/90 (BP Location: Left Arm, Patient Position: Sitting, Cuff Size: Normal)   Pulse (!) 111   Temp 98.1 F (36.7 C)   Wt 158 lb 9.6 oz (71.9 kg)   LMP 09/13/1975 (Approximate)   SpO2 95%   BMI 27.22 kg/m   Wt Readings from Last 3 Encounters:  02/06/16 158 lb 9.6 oz (71.9 kg)  12/04/15 152 lb (68.9 kg)  12/01/15 156 lb (70.8 kg)    Physical Exam  Constitutional: She is oriented to person, place, and time. She appears well-developed and well-nourished. No distress.  HENT:  Head: Normocephalic and atraumatic.  Right Ear: Tympanic membrane and ear canal normal.  Left Ear: Tympanic membrane and ear canal normal.  Nose: No rhinorrhea. Right sinus  exhibits maxillary sinus tenderness. Right sinus exhibits no frontal sinus tenderness. Left sinus exhibits maxillary sinus tenderness. Left sinus exhibits no frontal sinus tenderness.  Fistula lower septum.  States she never used cocaine  Eyes: Conjunctivae and lids are normal. Right eye exhibits no discharge. Left eye exhibits no discharge. No scleral icterus.  Cardiovascular: Normal rate and regular rhythm.   Pulmonary/Chest: Effort normal and breath sounds normal. No respiratory distress.  Abdominal: Normal appearance. There is no splenomegaly or hepatomegaly.  Musculoskeletal: Normal range of motion.  Neurological: She is alert and oriented to person, place, and time.  Skin: Skin is intact. No rash noted. No pallor.  Psychiatric: She has a normal mood and affect. Her behavior is normal. Judgment and thought content normal.      Assessment & Plan:   Problem List Items Addressed This Visit      Unprioritized   Oral-nasal fistula    Refer to ENT for the nasal fistula      Relevant Orders   Ambulatory referral to ENT    Other Visit Diagnoses    Acute non-recurrent maxillary sinusitis    -  Primary   Relevant Medications   amoxicillin-clavulanate (AUGMENTIN) 875-125 MG tablet   predniSONE (DELTASONE) 20 MG tablet   Allergic conjunctivitis of both eyes  Follow up plan: Return in about 3 months (around 05/06/2016) for regular f/u.

## 2016-02-06 NOTE — Assessment & Plan Note (Signed)
Refer to ENT for the nasal fistula

## 2016-02-27 DIAGNOSIS — M5416 Radiculopathy, lumbar region: Secondary | ICD-10-CM | POA: Diagnosis not present

## 2016-02-27 DIAGNOSIS — M533 Sacrococcygeal disorders, not elsewhere classified: Secondary | ICD-10-CM | POA: Diagnosis not present

## 2016-02-27 DIAGNOSIS — M47817 Spondylosis without myelopathy or radiculopathy, lumbosacral region: Secondary | ICD-10-CM | POA: Diagnosis not present

## 2016-02-27 DIAGNOSIS — M791 Myalgia: Secondary | ICD-10-CM | POA: Diagnosis not present

## 2016-03-02 DIAGNOSIS — R300 Dysuria: Secondary | ICD-10-CM | POA: Diagnosis not present

## 2016-03-02 DIAGNOSIS — H6522 Chronic serous otitis media, left ear: Secondary | ICD-10-CM | POA: Diagnosis not present

## 2016-03-02 DIAGNOSIS — M545 Low back pain: Secondary | ICD-10-CM | POA: Diagnosis not present

## 2016-03-15 ENCOUNTER — Other Ambulatory Visit: Payer: Self-pay

## 2016-03-15 ENCOUNTER — Ambulatory Visit (INDEPENDENT_AMBULATORY_CARE_PROVIDER_SITE_OTHER): Payer: Medicare HMO | Admitting: Family Medicine

## 2016-03-15 ENCOUNTER — Encounter: Payer: Self-pay | Admitting: Family Medicine

## 2016-03-15 VITALS — BP 143/87 | HR 93 | Temp 98.1°F | Wt 162.0 lb

## 2016-03-15 DIAGNOSIS — J0101 Acute recurrent maxillary sinusitis: Secondary | ICD-10-CM | POA: Diagnosis not present

## 2016-03-15 MED ORDER — PREDNISONE 20 MG PO TABS
40.0000 mg | ORAL_TABLET | Freq: Every day | ORAL | 0 refills | Status: DC
Start: 2016-03-15 — End: 2016-05-08

## 2016-03-15 MED ORDER — FLUTICASONE PROPIONATE 50 MCG/ACT NA SUSP: 2.0000 | Freq: Every day | NASAL | 6 refills | Status: DC

## 2016-03-15 MED ORDER — AZITHROMYCIN 250 MG PO TABS: ORAL_TABLET | ORAL | 0 refills | Status: DC

## 2016-03-15 MED ORDER — ALBUTEROL SULFATE HFA 108 (90 BASE) MCG/ACT IN AERS: 2.0000 | INHALATION_SPRAY | Freq: Four times a day (QID) | RESPIRATORY_TRACT | 3 refills | Status: DC | PRN

## 2016-03-15 MED ORDER — BUDESONIDE-FORMOTEROL FUMARATE 160-4.5 MCG/ACT IN AERO: 2.0000 | INHALATION_SPRAY | Freq: Two times a day (BID) | RESPIRATORY_TRACT | 3 refills | Status: DC

## 2016-03-15 NOTE — Patient Instructions (Signed)
Rinse mouth well with water after each use of Symbicort inhaler. No need to do so with the albuterol inhaler.

## 2016-03-15 NOTE — Progress Notes (Signed)
BP (!) 143/87   Pulse 93   Temp 98.1 F (36.7 C)   Wt 162 lb (73.5 kg)   LMP 09/13/1975 (Approximate)   SpO2 99%   BMI 27.81 kg/m    Subjective:    Patient ID: Amy Rocha, female    DOB: 25-Jul-1955, 61 y.o.   MRN: [id number]  HPI: Amy Rocha is a 61 y.o. female  Chief Complaint  Patient presents with  . URI    off and on for a month, but worse x 2 days. Head/chest congestion, runny nose, nose bleeds, cough, some sore throat, left ear stopped up, some body aches. No fever.   Patient presents with 1 month of intermittent congestion, sinus pain and pressure, epistaxis, sore throat, ear pain, body aches. Sxs have worsened over the past 2 days. Denies fever, CP, SOB, wheezing. Was given augmentin and prednisone a little over a month ago and was better temporarily but sxs returned. Seeing ENT in 2 weeks as she has a hx of sinus issues as well as septal fistula.   Relevant past medical, surgical, family and social history reviewed and updated as indicated. Interim medical history since our last visit reviewed. Allergies and medications reviewed and updated.  Review of Systems  Constitutional: Negative.   HENT: Positive for congestion, ear pain, nosebleeds, sinus pain and sinus pressure.   Respiratory: Negative.   Cardiovascular: Negative.   Gastrointestinal: Negative.   Genitourinary: Negative.   Musculoskeletal: Positive for myalgias.  Neurological: Negative.   Psychiatric/Behavioral: Negative.     Per HPI unless specifically indicated above     Objective:    BP (!) 143/87   Pulse 93   Temp 98.1 F (36.7 C)   Wt 162 lb (73.5 kg)   LMP 09/13/1975 (Approximate)   SpO2 99%   BMI 27.81 kg/m   Wt Readings from Last 3 Encounters:  03/15/16 162 lb (73.5 kg)  02/06/16 158 lb 9.6 oz (71.9 kg)  12/04/15 152 lb (68.9 kg)    Physical Exam  Constitutional: She is oriented to person, place, and time. She appears well-developed and well-nourished.  HENT:  Head:  Atraumatic.  Right Ear: External ear normal.  Left Ear: External ear normal.  Nasal mucosa injected. Septal fistula present on right Sinuses TTP  Eyes: Conjunctivae are normal. Pupils are equal, round, and reactive to light. No scleral icterus.  Neck: Normal range of motion. Neck supple.  Cardiovascular: Normal rate and normal heart sounds.   Pulmonary/Chest: Effort normal and breath sounds normal. No respiratory distress.  Musculoskeletal: Normal range of motion.  Lymphadenopathy:    She has no cervical adenopathy.  Neurological: She is alert and oriented to person, place, and time.  Skin: Skin is warm and dry.  Psychiatric: She has a normal mood and affect. Her behavior is normal.  Nursing note and vitals reviewed.     Assessment & Plan:   Problem List Items Addressed This Visit    None    Visit Diagnoses    Acute recurrent maxillary sinusitis    -  Primary   Will treat with azithromycin and prednisone, continue current inhaler regimen. Follow up if worsening or no improvement. Await ENT consult   Relevant Medications   loratadine (CLARITIN) 10 MG tablet   azithromycin (ZITHROMAX) 250 MG tablet   predniSONE (DELTASONE) 20 MG tablet   fluticasone (FLONASE) 50 MCG/ACT nasal spray       Follow up plan: Return for as scheduled.

## 2016-03-26 ENCOUNTER — Other Ambulatory Visit: Payer: Self-pay | Admitting: Pain Medicine

## 2016-03-26 DIAGNOSIS — M533 Sacrococcygeal disorders, not elsewhere classified: Secondary | ICD-10-CM | POA: Diagnosis not present

## 2016-03-26 DIAGNOSIS — M791 Myalgia: Secondary | ICD-10-CM | POA: Diagnosis not present

## 2016-03-26 DIAGNOSIS — M47817 Spondylosis without myelopathy or radiculopathy, lumbosacral region: Secondary | ICD-10-CM | POA: Diagnosis not present

## 2016-03-26 DIAGNOSIS — M5416 Radiculopathy, lumbar region: Secondary | ICD-10-CM | POA: Diagnosis not present

## 2016-04-01 DIAGNOSIS — H60332 Swimmer's ear, left ear: Secondary | ICD-10-CM | POA: Diagnosis not present

## 2016-04-01 DIAGNOSIS — J3489 Other specified disorders of nose and nasal sinuses: Secondary | ICD-10-CM | POA: Diagnosis not present

## 2016-04-01 DIAGNOSIS — J320 Chronic maxillary sinusitis: Secondary | ICD-10-CM | POA: Diagnosis not present

## 2016-04-23 DIAGNOSIS — Z79891 Long term (current) use of opiate analgesic: Secondary | ICD-10-CM | POA: Diagnosis not present

## 2016-04-23 DIAGNOSIS — M533 Sacrococcygeal disorders, not elsewhere classified: Secondary | ICD-10-CM | POA: Diagnosis not present

## 2016-04-23 DIAGNOSIS — M5416 Radiculopathy, lumbar region: Secondary | ICD-10-CM | POA: Diagnosis not present

## 2016-04-23 DIAGNOSIS — M47817 Spondylosis without myelopathy or radiculopathy, lumbosacral region: Secondary | ICD-10-CM | POA: Diagnosis not present

## 2016-04-23 DIAGNOSIS — M791 Myalgia: Secondary | ICD-10-CM | POA: Diagnosis not present

## 2016-04-23 DIAGNOSIS — G894 Chronic pain syndrome: Secondary | ICD-10-CM | POA: Diagnosis not present

## 2016-04-29 DIAGNOSIS — J320 Chronic maxillary sinusitis: Secondary | ICD-10-CM | POA: Diagnosis not present

## 2016-04-29 DIAGNOSIS — J3489 Other specified disorders of nose and nasal sinuses: Secondary | ICD-10-CM | POA: Diagnosis not present

## 2016-05-07 ENCOUNTER — Other Ambulatory Visit: Payer: Self-pay | Admitting: Otolaryngology

## 2016-05-07 DIAGNOSIS — J324 Chronic pansinusitis: Secondary | ICD-10-CM

## 2016-05-08 ENCOUNTER — Ambulatory Visit: Payer: Medicare Other | Admitting: Unknown Physician Specialty

## 2016-05-08 ENCOUNTER — Encounter: Payer: Self-pay | Admitting: Family Medicine

## 2016-05-08 ENCOUNTER — Ambulatory Visit (INDEPENDENT_AMBULATORY_CARE_PROVIDER_SITE_OTHER): Payer: Medicare HMO | Admitting: Family Medicine

## 2016-05-08 ENCOUNTER — Ambulatory Visit: Payer: Self-pay | Admitting: Family Medicine

## 2016-05-08 ENCOUNTER — Other Ambulatory Visit: Payer: Self-pay

## 2016-05-08 VITALS — BP 131/72 | HR 86 | Temp 98.0°F | Wt 165.0 lb

## 2016-05-08 DIAGNOSIS — I10 Essential (primary) hypertension: Secondary | ICD-10-CM | POA: Diagnosis not present

## 2016-05-08 DIAGNOSIS — F331 Major depressive disorder, recurrent, moderate: Secondary | ICD-10-CM | POA: Diagnosis not present

## 2016-05-08 DIAGNOSIS — G5601 Carpal tunnel syndrome, right upper limb: Secondary | ICD-10-CM

## 2016-05-08 DIAGNOSIS — E782 Mixed hyperlipidemia: Secondary | ICD-10-CM | POA: Diagnosis not present

## 2016-05-08 MED ORDER — VORTIOXETINE HBR 5 MG PO TABS: 1.0000 | ORAL_TABLET | Freq: Every day | ORAL | 1 refills | Status: DC

## 2016-05-08 MED ORDER — AMLODIPINE BESYLATE 5 MG PO TABS: 5.0000 mg | ORAL_TABLET | Freq: Every day | ORAL | 1 refills | Status: DC

## 2016-05-08 NOTE — Progress Notes (Signed)
BP 131/72   Pulse 86   Temp 98 F (36.7 C)   Wt 165 lb (74.8 kg)   LMP 09/13/1975 (Approximate)   SpO2 96%   BMI 28.32 kg/m    Subjective:    Patient ID: Amy Rocha, female    DOB: 09-Nov-1955, 61 y.o.   MRN: [id number]  HPI: Amy Rocha is a 61 y.o. female  Chief Complaint  Patient presents with  . Hyperlipidemia    follow up  . Hypertension    follow up  . Sinusitis    has been having alot of sinus issues, went to ENT and was treated for staph infection.   Patient presents today for chronic illness management.  Working with ENT about her sinus issues, getting a CT scan end of April to see if she needs further intervention. Has been 2 rounds of prednisone and 2 antibiotics recently for these issues.   Right hand pain, numbness, and tingling x 2+ months. Only temporary relief with prednisone. Does repetitive work on [employer]. Has been trying stretches, braces, and tylenol with no relief. Unable to take NSAIDs due to CKD. Followed by pain management for chronic pain.   BPs doing well, no concerns. Taking amlodipine 5 mg faithfully without side effects. Denies CP, SOB, dizziness, HAs.   No concerns with lipitor, taking faithfully. Watches what she eats and tries to stay active as much as possible. Not fasting today for labs.   Past Medical History:  Diagnosis Date  . Anxiety   . Arthritis   . Asthma   . Avascular necrosis ([place])   . Blood transfusion without reported diagnosis   . Calculus of kidney   . CHF (congestive heart failure) ([place])   . Chronic pain syndrome   . COPD (chronic obstructive pulmonary disease) ([place])   . Depression   . GERD (gastroesophageal reflux disease)   . Heart failure ([place])   . Hyperlipidemia   . Hypertension   . IBS (irritable bowel syndrome)   . Joint pain   . Osteoporosis   . Oxygen deficiency   . Peripheral vascular disease ([place])   . Urge incontinence    Social History   Social History  . Marital status: Married   Spouse name: N/A  . Number of children: N/A  . Years of education: N/A   Occupational History  . Not on file.   Social History Main Topics  . Smoking status: Former Research scientist (life sciences)  . Smokeless tobacco: Never Used  . Alcohol use No  . Drug use: No  . Sexual activity: Yes   Other Topics Concern  . Not on file   Social History Narrative  . No narrative on file   Relevant past medical, surgical, family and social history reviewed and updated as indicated. Interim medical history since our last visit reviewed. Allergies and medications reviewed and updated.  Review of Systems  Constitutional: Negative.   HENT: Positive for congestion, sinus pain and sinus pressure.   Eyes: Negative.   Respiratory: Negative.   Cardiovascular: Negative.   Gastrointestinal: Negative.   Genitourinary: Negative.   Musculoskeletal: Positive for arthralgias.  Neurological: Positive for numbness.  Psychiatric/Behavioral: Negative.     Per HPI unless specifically indicated above     Objective:    BP 131/72   Pulse 86   Temp 98 F (36.7 C)   Wt 165 lb (74.8 kg)   LMP 09/13/1975 (Approximate)   SpO2 96%   BMI 28.32 kg/m   Wt Readings  from Last 3 Encounters:  05/08/16 165 lb (74.8 kg)  03/15/16 162 lb (73.5 kg)  02/06/16 158 lb 9.6 oz (71.9 kg)    Physical Exam  Constitutional: She is oriented to person, place, and time. She appears well-developed and well-nourished. No distress.  HENT:  Head: Atraumatic.  Eyes: Conjunctivae are normal. Pupils are equal, round, and reactive to light.  Neck: Normal range of motion. Neck supple.  Cardiovascular: Normal rate and normal heart sounds.   Pulmonary/Chest: Effort normal and breath sounds normal. No respiratory distress.  Musculoskeletal: Normal range of motion. She exhibits tenderness (mildly ttp over right wrist). She exhibits no edema.  Neurological: She is alert and oriented to person, place, and time.  Skin: Skin is warm and dry.  Psychiatric:  She has a normal mood and affect. Her behavior is normal.  Nursing note and vitals reviewed.     Assessment & Plan:   Problem List Items Addressed This Visit      Other   Hyperlipidemia - Primary    Stable on lipitor, check non-fasting lipid panel today. Continue current regimen.       Relevant Medications   amLODipine (NORVASC) 5 MG tablet   Other Relevant Orders   Comprehensive metabolic panel (Completed)   Lipid Panel w/o Chol/HDL Ratio (Completed)   Depression    Has been on cymbalta and wellbutrin for many years and no longer feels like this regimen is helpful. Discussed at length several options, pt agreeable to adding 5 mg trintellix to her existing wellbutrin. D/c cymbalta with slow taper. Risks and benefits discussed      Relevant Medications   vortioxetine HBr (TRINTELLIX) 5 MG TABS    Other Visit Diagnoses    Benign essential HTN       Stable on amlodipine 5 mg, continue current regimen   Relevant Medications   amLODipine (NORVASC) 5 MG tablet   Other Relevant Orders   Comprehensive metabolic panel (Completed)   Carpal tunnel syndrome of right wrist       Discussed several options, pt opts to be referred to orthopedics for further eval and treatment. Continue brace, stretches, tylenol, icy hot   Relevant Medications   vortioxetine HBr (TRINTELLIX) 5 MG TABS   Other Relevant Orders   AMB referral to orthopedics      Follow up plan: Return in about 6 weeks (around 06/19/2016) for Depression f/u.

## 2016-05-08 NOTE — Assessment & Plan Note (Signed)
Stable on lipitor, check non-fasting lipid panel today. Continue current regimen.

## 2016-05-08 NOTE — Patient Instructions (Signed)
Taper down the cymbalta slowly - every other day for a few days, then every 2 days for a few days, then every 3 days, etc.  Start trintellix once daily

## 2016-05-09 ENCOUNTER — Telehealth: Payer: Self-pay | Admitting: Unknown Physician Specialty

## 2016-05-09 LAB — LIPID PANEL W/O CHOL/HDL RATIO
Cholesterol, Total: 165 mg/dL (ref 100–199)
HDL: 76 mg/dL (ref >39)
LDL Calculated: 59 mg/dL (ref 0–99)
TRIGLYCERIDES: 152 mg/dL — AB (ref 0–149)
VLDL CHOLESTEROL CAL: 30 mg/dL (ref 5–40)

## 2016-05-09 LAB — COMPREHENSIVE METABOLIC PANEL
A/G RATIO: 1.9 (ref 1.2–2.2)
ALT: 25 IU/L (ref 0–32)
AST: 26 IU/L (ref 0–40)
Albumin: 3.9 g/dL (ref 3.6–4.8)
Alkaline Phosphatase: 108 IU/L (ref 39–117)
BILIRUBIN TOTAL: 0.2 mg/dL (ref 0.0–1.2)
BUN/Creatinine Ratio: 8 — ABNORMAL LOW (ref 12–28)
BUN: 11 mg/dL (ref 8–27)
CALCIUM: 9.2 mg/dL (ref 8.7–10.3)
CHLORIDE: 103 mmol/L (ref 96–106)
CO2: 23 mmol/L (ref 18–29)
Creatinine, Ser: 1.3 mg/dL — ABNORMAL HIGH (ref 0.57–1.00)
GFR, EST AFRICAN AMERICAN: 50 mL/min/{1.73_m2} — AB (ref >59)
GFR, EST NON AFRICAN AMERICAN: 44 mL/min/{1.73_m2} — AB (ref >59)
GLUCOSE: 114 mg/dL — AB (ref 65–99)
Globulin, Total: 2.1 g/dL (ref 1.5–4.5)
POTASSIUM: 4.5 mmol/L (ref 3.5–5.2)
Sodium: 139 mmol/L (ref 134–144)
TOTAL PROTEIN: 6 g/dL (ref 6.0–8.5)

## 2016-05-09 NOTE — Telephone Encounter (Signed)
Patient was referred to [facility] but she has an issue with them from years ago, so she is wanting to be referred to another location that is covered under her insurance.  She knows that [facility] will not accept her ins.   She did not have any suggestions.    Thanks

## 2016-05-09 NOTE — Assessment & Plan Note (Addendum)
Has been on cymbalta and wellbutrin for many years and no longer feels like this regimen is helpful. Discussed at length several options, pt agreeable to adding 5 mg trintellix to her existing wellbutrin. D/c cymbalta with slow taper. Risks and benefits discussed

## 2016-05-10 NOTE — Telephone Encounter (Signed)
Patient requested a different place than [facility].  Spoke with patient. Wants to go to [facility]. Redirecting referral. They will call and schedule appointment.

## 2016-05-14 ENCOUNTER — Ambulatory Visit (INDEPENDENT_AMBULATORY_CARE_PROVIDER_SITE_OTHER): Payer: Medicare HMO | Admitting: Orthopaedic Surgery

## 2016-05-14 ENCOUNTER — Ambulatory Visit (INDEPENDENT_AMBULATORY_CARE_PROVIDER_SITE_OTHER): Payer: Medicare HMO

## 2016-05-14 ENCOUNTER — Encounter (INDEPENDENT_AMBULATORY_CARE_PROVIDER_SITE_OTHER): Payer: Self-pay | Admitting: Orthopaedic Surgery

## 2016-05-14 DIAGNOSIS — M25531 Pain in right wrist: Secondary | ICD-10-CM | POA: Diagnosis not present

## 2016-05-14 MED ORDER — BUPIVACAINE HCL 0.5 % IJ SOLN
1.0000 mL | INTRAMUSCULAR | Status: AC | PRN
  Administered 2016-05-14: 1 mL via INTRA_ARTICULAR

## 2016-05-14 MED ORDER — LIDOCAINE HCL 1 % IJ SOLN
1.0000 mL | INTRAMUSCULAR | Status: AC | PRN
  Administered 2016-05-14: 1 mL

## 2016-05-14 MED ORDER — METHYLPREDNISOLONE ACETATE 40 MG/ML IJ SUSP
40.0000 mg | INTRAMUSCULAR | Status: AC | PRN
  Administered 2016-05-14: 40 mg via INTRA_ARTICULAR

## 2016-05-14 NOTE — Progress Notes (Signed)
Office Visit Note   Patient: Amy Rocha           Date of Birth: 05/24/1955           MRN: [id number] Visit Date: 05/14/2016              Requested by: Volney American, PA-C [place], [place] [id number] PCP: Volney American, PA-C   Assessment & Plan: Visit Diagnoses:  1. Pain in right wrist     Plan: My impression is right wrist flareup possible gout with possible carpal tunnel syndrome. Wrist injection was performed without any immediate complications. Continue wrist brace. Arthritis panel obtained today. Nerve conduction studies to evaluate for carpal tunnel syndrome. Follow-up in about 2 weeks.  Follow-Up Instructions: Return in about 2 weeks (around 05/28/2016).   Orders:  Orders Placed This Encounter  Procedures  . XR Wrist Complete Right  . Ambulatory referral to Physical Medicine Rehab   No orders of the defined types were placed in this encounter.     Procedures: Medium Joint Inj Date/Time: 05/14/2016 10:56 AM Performed by: Leandrew Koyanagi Authorized by: Leandrew Koyanagi   Consent Given by:  Patient Timeout: prior to procedure the correct patient, procedure, and site was verified   Indications:  Pain Location:  Wrist Site:  R radiocarpal Prep: patient was prepped and draped in usual sterile fashion   Needle Size:  25 G Approach:  Dorsal Ultrasound Guided: No   Fluoroscopic Guidance: No   Medications:  1 mL lidocaine 1 %; 1 mL bupivacaine 0.5 %; 40 mg methylPREDNISolone acetate 40 MG/ML     Clinical Data: No additional findings.   Subjective: Chief Complaint  Patient presents with  . Right Wrist - Pain    Patient is a 61 year old female who comes in with acute right wrist pain and numbness and burning pain for about 3 months. She feels swelling of her wrists. She has very painful movement of the hand and fingers and wrists. She has worn a wrist brace without any significant relief. The pain does not radiate.    Review of  Systems  Constitutional: Negative.   HENT: Negative.   Eyes: Negative.   Respiratory: Negative.   Cardiovascular: Negative.   Endocrine: Negative.   Musculoskeletal: Negative.   Neurological: Negative.   Hematological: Negative.   Psychiatric/Behavioral: Negative.   All other systems reviewed and are negative.    Objective: Vital Signs: LMP 09/13/1975 (Approximate)   Physical Exam  Constitutional: She is oriented to person, place, and time. She appears well-developed and well-nourished.  HENT:  Head: Normocephalic and atraumatic.  Eyes: EOM are normal.  Neck: Neck supple.  Pulmonary/Chest: Effort normal.  Abdominal: Soft.  Neurological: She is alert and oriented to person, place, and time.  Skin: Skin is warm. Capillary refill takes less than 2 seconds.  Psychiatric: She has a normal mood and affect. Her behavior is normal. Judgment and thought content normal.  Nursing note and vitals reviewed.   Ortho Exam Right hand and wrist exam shows positive carpal tunnel signs. She has no muscular atrophy. She does have a wrist fusion with tenderness dorsally over the wrist joint. There is no cellulitis.  There is slight warmth of the wrist. Specialty Comments:  No specialty comments available.  Imaging: Xr Wrist Complete Right  Result Date: 05/14/2016 Diffuse osteopenia without any acute findings    PMFS History: Patient Active Problem List   Diagnosis Date Noted  . Oral-nasal fistula 02/06/2016  .  Atherosclerotic peripheral vascular disease ([place]) 09/21/2015  . Insomnia 09/06/2015  . Complex regional pain syndrome of lower extremity 07/11/2015  . Bilateral occipital neuralgia 05/10/2015  . DDD (degenerative disc disease), lumbar 10/26/2014  . Facet syndrome, lumbar 10/26/2014  . History of total hip replacement 10/26/2014  . Avascular necrosis of bones of both hips ([place]) 10/26/2014  . Sacroiliac joint dysfunction 10/26/2014  . Calculus of kidney 09/08/2014  . Acute  anxiety 09/08/2014  . GERD (gastroesophageal reflux disease) 09/08/2014  . Joint pain of leg 09/08/2014  . Urge incontinence 09/08/2014  . Hypertensive heart failure ([place]) 09/08/2014  . Avascular necrosis ([place]) 09/08/2014  . Anemia 09/08/2014  . Atherosclerosis of coronary artery 09/08/2014  . IBS (irritable bowel syndrome) 09/08/2014  . Asthma 09/08/2014  . CHF (congestive heart failure) ([place]) 09/08/2014  . Hyperlipidemia 09/08/2014  . COPD (chronic obstructive pulmonary disease) ([place]) 09/08/2014  . Depression 09/08/2014  . Hypertensive CKD (chronic kidney disease) 09/08/2014  . CKD (chronic kidney disease), stage IV ([place]) 09/08/2014  . Proteinuria 09/08/2014  . Chronic pain 09/08/2014   Past Medical History:  Diagnosis Date  . Anxiety   . Arthritis   . Asthma   . Avascular necrosis ([place])   . Blood transfusion without reported diagnosis   . Calculus of kidney   . CHF (congestive heart failure) ([place])   . Chronic pain syndrome   . COPD (chronic obstructive pulmonary disease) ([place])   . Depression   . GERD (gastroesophageal reflux disease)   . Heart failure ([place])   . Hyperlipidemia   . Hypertension   . IBS (irritable bowel syndrome)   . Joint pain   . Osteoporosis   . Oxygen deficiency   . Peripheral vascular disease ([place])   . Urge incontinence     Family History  Problem Relation Age of Onset  . Dementia Mother   . Cancer Father     colon  . Arthritis Brother   . Hypertension Daughter   . Aneurysm Brother     Past Surgical History:  Procedure Laterality Date  . ABDOMINAL HYSTERECTOMY    . avascular necrosis    . CARDIAC CATHETERIZATION Left 12/01/2015   Procedure: Left Heart Cath and Coronary Angiography;  Surgeon: Teodoro Spray, MD;  Location: [facility];  Service: Cardiovascular;  Laterality: Left;  . eyelid lift    . HERNIA REPAIR    . JOINT REPLACEMENT     hip  . NASAL SINUS SURGERY     Social History   Occupational History  . Not on  file.   Social History Main Topics  . Smoking status: Former Research scientist (life sciences)  . Smokeless tobacco: Never Used  . Alcohol use No  . Drug use: No  . Sexual activity: Yes

## 2016-05-15 ENCOUNTER — Telehealth (INDEPENDENT_AMBULATORY_CARE_PROVIDER_SITE_OTHER): Payer: Self-pay | Admitting: Orthopaedic Surgery

## 2016-05-15 LAB — URIC ACID: Uric Acid, Serum: 5.3 mg/dL (ref 2.5–7.0)

## 2016-05-15 LAB — SEDIMENTATION RATE: SED RATE: 13 mm/h (ref 0–30)

## 2016-05-15 LAB — RHEUMATOID FACTOR: Rhuematoid fact SerPl-aCnc: <14 IU/mL (ref <14)

## 2016-05-15 LAB — C-REACTIVE PROTEIN: CRP: 2.1 mg/L (ref <8.0)

## 2016-05-15 NOTE — Telephone Encounter (Signed)
Please advise 

## 2016-05-15 NOTE — Telephone Encounter (Signed)
Patient called wanting to know about her blood work testing her for gout. CB # [phone]

## 2016-05-15 NOTE — Telephone Encounter (Signed)
It was all normal

## 2016-05-16 NOTE — Telephone Encounter (Signed)
Called pt to advise

## 2016-05-22 DIAGNOSIS — M47817 Spondylosis without myelopathy or radiculopathy, lumbosacral region: Secondary | ICD-10-CM | POA: Diagnosis not present

## 2016-05-22 DIAGNOSIS — M5416 Radiculopathy, lumbar region: Secondary | ICD-10-CM | POA: Diagnosis not present

## 2016-05-22 DIAGNOSIS — M791 Myalgia: Secondary | ICD-10-CM | POA: Diagnosis not present

## 2016-05-22 DIAGNOSIS — M533 Sacrococcygeal disorders, not elsewhere classified: Secondary | ICD-10-CM | POA: Diagnosis not present

## 2016-05-22 DIAGNOSIS — Z79891 Long term (current) use of opiate analgesic: Secondary | ICD-10-CM | POA: Diagnosis not present

## 2016-05-23 ENCOUNTER — Other Ambulatory Visit: Payer: Self-pay | Admitting: Unknown Physician Specialty

## 2016-05-23 NOTE — Telephone Encounter (Signed)
Routing to provider  

## 2016-05-24 NOTE — Telephone Encounter (Signed)
Your patient.  Thanks 

## 2016-05-27 ENCOUNTER — Ambulatory Visit
Admission: RE | Admit: 2016-05-27 | Discharge: 2016-05-27 | Disposition: A | Discharge status: Home / Self Care | Payer: Medicare HMO | Source: Ambulatory Visit | Attending: Otolaryngology | Admitting: Otolaryngology

## 2016-05-27 DIAGNOSIS — J324 Chronic pansinusitis: Secondary | ICD-10-CM | POA: Diagnosis not present

## 2016-05-27 DIAGNOSIS — J209 Acute bronchitis, unspecified: Secondary | ICD-10-CM | POA: Diagnosis not present

## 2016-05-28 ENCOUNTER — Ambulatory Visit (INDEPENDENT_AMBULATORY_CARE_PROVIDER_SITE_OTHER): Payer: Medicare HMO | Admitting: Orthopaedic Surgery

## 2016-05-29 ENCOUNTER — Encounter (INDEPENDENT_AMBULATORY_CARE_PROVIDER_SITE_OTHER): Payer: Medicare HMO | Admitting: Physical Medicine and Rehabilitation

## 2016-05-29 DIAGNOSIS — J310 Chronic rhinitis: Secondary | ICD-10-CM | POA: Diagnosis not present

## 2016-05-29 DIAGNOSIS — J329 Chronic sinusitis, unspecified: Secondary | ICD-10-CM | POA: Diagnosis not present

## 2016-06-03 ENCOUNTER — Ambulatory Visit (INDEPENDENT_AMBULATORY_CARE_PROVIDER_SITE_OTHER): Payer: Medicare HMO | Admitting: Orthopaedic Surgery

## 2016-06-10 DIAGNOSIS — J449 Chronic obstructive pulmonary disease, unspecified: Secondary | ICD-10-CM | POA: Diagnosis not present

## 2016-06-10 DIAGNOSIS — J929 Pleural plaque without asbestos: Secondary | ICD-10-CM | POA: Diagnosis not present

## 2016-06-10 DIAGNOSIS — Z7951 Long term (current) use of inhaled steroids: Secondary | ICD-10-CM | POA: Diagnosis not present

## 2016-06-10 DIAGNOSIS — J310 Chronic rhinitis: Secondary | ICD-10-CM | POA: Diagnosis not present

## 2016-06-10 DIAGNOSIS — N200 Calculus of kidney: Secondary | ICD-10-CM | POA: Diagnosis not present

## 2016-06-10 DIAGNOSIS — M1990 Unspecified osteoarthritis, unspecified site: Secondary | ICD-10-CM | POA: Diagnosis not present

## 2016-06-10 DIAGNOSIS — N184 Chronic kidney disease, stage 4 (severe): Secondary | ICD-10-CM | POA: Diagnosis not present

## 2016-06-10 DIAGNOSIS — K589 Irritable bowel syndrome without diarrhea: Secondary | ICD-10-CM | POA: Diagnosis not present

## 2016-06-10 DIAGNOSIS — I129 Hypertensive chronic kidney disease with stage 1 through stage 4 chronic kidney disease, or unspecified chronic kidney disease: Secondary | ICD-10-CM | POA: Diagnosis not present

## 2016-06-10 DIAGNOSIS — I509 Heart failure, unspecified: Secondary | ICD-10-CM | POA: Diagnosis not present

## 2016-06-10 DIAGNOSIS — R5383 Other fatigue: Secondary | ICD-10-CM | POA: Diagnosis not present

## 2016-06-10 DIAGNOSIS — J45909 Unspecified asthma, uncomplicated: Secondary | ICD-10-CM | POA: Diagnosis not present

## 2016-06-10 DIAGNOSIS — N189 Chronic kidney disease, unspecified: Secondary | ICD-10-CM | POA: Diagnosis not present

## 2016-06-10 DIAGNOSIS — I10 Essential (primary) hypertension: Secondary | ICD-10-CM | POA: Diagnosis not present

## 2016-06-10 DIAGNOSIS — Z01818 Encounter for other preprocedural examination: Secondary | ICD-10-CM | POA: Diagnosis not present

## 2016-06-10 DIAGNOSIS — J329 Chronic sinusitis, unspecified: Secondary | ICD-10-CM | POA: Diagnosis not present

## 2016-06-11 DIAGNOSIS — M5136 Other intervertebral disc degeneration, lumbar region: Secondary | ICD-10-CM | POA: Diagnosis not present

## 2016-06-11 DIAGNOSIS — M47817 Spondylosis without myelopathy or radiculopathy, lumbosacral region: Secondary | ICD-10-CM | POA: Diagnosis not present

## 2016-06-11 DIAGNOSIS — M79604 Pain in right leg: Secondary | ICD-10-CM | POA: Diagnosis not present

## 2016-06-11 DIAGNOSIS — M25552 Pain in left hip: Secondary | ICD-10-CM | POA: Diagnosis not present

## 2016-06-11 DIAGNOSIS — M533 Sacrococcygeal disorders, not elsewhere classified: Secondary | ICD-10-CM | POA: Diagnosis not present

## 2016-06-11 DIAGNOSIS — M791 Myalgia: Secondary | ICD-10-CM | POA: Diagnosis not present

## 2016-06-11 DIAGNOSIS — M79605 Pain in left leg: Secondary | ICD-10-CM | POA: Diagnosis not present

## 2016-06-11 DIAGNOSIS — M5137 Other intervertebral disc degeneration, lumbosacral region: Secondary | ICD-10-CM | POA: Diagnosis not present

## 2016-06-11 DIAGNOSIS — M48061 Spinal stenosis, lumbar region without neurogenic claudication: Secondary | ICD-10-CM | POA: Diagnosis not present

## 2016-06-11 DIAGNOSIS — M79662 Pain in left lower leg: Secondary | ICD-10-CM | POA: Diagnosis not present

## 2016-06-11 DIAGNOSIS — M5416 Radiculopathy, lumbar region: Secondary | ICD-10-CM | POA: Diagnosis not present

## 2016-06-11 DIAGNOSIS — M47816 Spondylosis without myelopathy or radiculopathy, lumbar region: Secondary | ICD-10-CM | POA: Diagnosis not present

## 2016-06-17 DIAGNOSIS — N184 Chronic kidney disease, stage 4 (severe): Secondary | ICD-10-CM | POA: Diagnosis not present

## 2016-06-17 DIAGNOSIS — J3489 Other specified disorders of nose and nasal sinuses: Secondary | ICD-10-CM | POA: Diagnosis not present

## 2016-06-17 DIAGNOSIS — J449 Chronic obstructive pulmonary disease, unspecified: Secondary | ICD-10-CM | POA: Diagnosis not present

## 2016-06-17 DIAGNOSIS — Z96641 Presence of right artificial hip joint: Secondary | ICD-10-CM | POA: Diagnosis not present

## 2016-06-17 DIAGNOSIS — I2510 Atherosclerotic heart disease of native coronary artery without angina pectoris: Secondary | ICD-10-CM | POA: Diagnosis not present

## 2016-06-17 DIAGNOSIS — J310 Chronic rhinitis: Secondary | ICD-10-CM | POA: Diagnosis not present

## 2016-06-17 DIAGNOSIS — G4700 Insomnia, unspecified: Secondary | ICD-10-CM | POA: Diagnosis not present

## 2016-06-17 DIAGNOSIS — J329 Chronic sinusitis, unspecified: Secondary | ICD-10-CM | POA: Diagnosis not present

## 2016-06-17 DIAGNOSIS — M5481 Occipital neuralgia: Secondary | ICD-10-CM | POA: Diagnosis not present

## 2016-06-17 DIAGNOSIS — M2550 Pain in unspecified joint: Secondary | ICD-10-CM | POA: Diagnosis not present

## 2016-06-18 DIAGNOSIS — J329 Chronic sinusitis, unspecified: Secondary | ICD-10-CM | POA: Diagnosis not present

## 2016-06-18 DIAGNOSIS — N184 Chronic kidney disease, stage 4 (severe): Secondary | ICD-10-CM | POA: Diagnosis not present

## 2016-06-18 DIAGNOSIS — M5481 Occipital neuralgia: Secondary | ICD-10-CM | POA: Diagnosis not present

## 2016-06-18 DIAGNOSIS — Z96641 Presence of right artificial hip joint: Secondary | ICD-10-CM | POA: Diagnosis not present

## 2016-06-18 DIAGNOSIS — J449 Chronic obstructive pulmonary disease, unspecified: Secondary | ICD-10-CM | POA: Diagnosis not present

## 2016-06-18 DIAGNOSIS — I2510 Atherosclerotic heart disease of native coronary artery without angina pectoris: Secondary | ICD-10-CM | POA: Diagnosis not present

## 2016-06-18 DIAGNOSIS — M2550 Pain in unspecified joint: Secondary | ICD-10-CM | POA: Diagnosis not present

## 2016-06-18 DIAGNOSIS — G4700 Insomnia, unspecified: Secondary | ICD-10-CM | POA: Diagnosis not present

## 2016-06-18 DIAGNOSIS — J310 Chronic rhinitis: Secondary | ICD-10-CM | POA: Diagnosis not present

## 2016-06-19 ENCOUNTER — Ambulatory Visit: Payer: Self-pay | Admitting: Family Medicine

## 2016-06-25 DIAGNOSIS — J310 Chronic rhinitis: Secondary | ICD-10-CM | POA: Diagnosis not present

## 2016-06-25 DIAGNOSIS — J324 Chronic pansinusitis: Secondary | ICD-10-CM | POA: Diagnosis not present

## 2016-07-09 DIAGNOSIS — J329 Chronic sinusitis, unspecified: Secondary | ICD-10-CM | POA: Diagnosis not present

## 2016-07-12 ENCOUNTER — Ambulatory Visit: Payer: Medicare HMO | Admitting: Family Medicine

## 2016-07-16 DIAGNOSIS — M25552 Pain in left hip: Secondary | ICD-10-CM | POA: Diagnosis not present

## 2016-07-16 DIAGNOSIS — M79604 Pain in right leg: Secondary | ICD-10-CM | POA: Diagnosis not present

## 2016-07-16 DIAGNOSIS — M533 Sacrococcygeal disorders, not elsewhere classified: Secondary | ICD-10-CM | POA: Diagnosis not present

## 2016-07-16 DIAGNOSIS — M5136 Other intervertebral disc degeneration, lumbar region: Secondary | ICD-10-CM | POA: Diagnosis not present

## 2016-07-16 DIAGNOSIS — M25561 Pain in right knee: Secondary | ICD-10-CM | POA: Diagnosis not present

## 2016-07-16 DIAGNOSIS — M5416 Radiculopathy, lumbar region: Secondary | ICD-10-CM | POA: Diagnosis not present

## 2016-07-16 DIAGNOSIS — M47817 Spondylosis without myelopathy or radiculopathy, lumbosacral region: Secondary | ICD-10-CM | POA: Diagnosis not present

## 2016-07-16 DIAGNOSIS — M48061 Spinal stenosis, lumbar region without neurogenic claudication: Secondary | ICD-10-CM | POA: Diagnosis not present

## 2016-07-16 DIAGNOSIS — M5137 Other intervertebral disc degeneration, lumbosacral region: Secondary | ICD-10-CM | POA: Diagnosis not present

## 2016-07-16 DIAGNOSIS — M791 Myalgia: Secondary | ICD-10-CM | POA: Diagnosis not present

## 2016-07-16 DIAGNOSIS — M1711 Unilateral primary osteoarthritis, right knee: Secondary | ICD-10-CM | POA: Diagnosis not present

## 2016-07-16 DIAGNOSIS — Z79891 Long term (current) use of opiate analgesic: Secondary | ICD-10-CM | POA: Diagnosis not present

## 2016-07-18 DIAGNOSIS — F0630 Mood disorder due to known physiological condition, unspecified: Secondary | ICD-10-CM | POA: Diagnosis not present

## 2016-07-23 DIAGNOSIS — M48061 Spinal stenosis, lumbar region without neurogenic claudication: Secondary | ICD-10-CM | POA: Diagnosis not present

## 2016-07-23 DIAGNOSIS — M25552 Pain in left hip: Secondary | ICD-10-CM | POA: Diagnosis not present

## 2016-07-23 DIAGNOSIS — M791 Myalgia: Secondary | ICD-10-CM | POA: Diagnosis not present

## 2016-07-23 DIAGNOSIS — M5416 Radiculopathy, lumbar region: Secondary | ICD-10-CM | POA: Diagnosis not present

## 2016-07-23 DIAGNOSIS — M533 Sacrococcygeal disorders, not elsewhere classified: Secondary | ICD-10-CM | POA: Diagnosis not present

## 2016-07-23 DIAGNOSIS — M5136 Other intervertebral disc degeneration, lumbar region: Secondary | ICD-10-CM | POA: Diagnosis not present

## 2016-07-23 DIAGNOSIS — Z79891 Long term (current) use of opiate analgesic: Secondary | ICD-10-CM | POA: Diagnosis not present

## 2016-07-23 DIAGNOSIS — M25561 Pain in right knee: Secondary | ICD-10-CM | POA: Diagnosis not present

## 2016-07-23 DIAGNOSIS — M5137 Other intervertebral disc degeneration, lumbosacral region: Secondary | ICD-10-CM | POA: Diagnosis not present

## 2016-07-23 DIAGNOSIS — M47817 Spondylosis without myelopathy or radiculopathy, lumbosacral region: Secondary | ICD-10-CM | POA: Diagnosis not present

## 2016-07-23 DIAGNOSIS — M1711 Unilateral primary osteoarthritis, right knee: Secondary | ICD-10-CM | POA: Diagnosis not present

## 2016-07-23 DIAGNOSIS — M79604 Pain in right leg: Secondary | ICD-10-CM | POA: Diagnosis not present

## 2016-08-08 ENCOUNTER — Encounter: Payer: Self-pay | Admitting: Family Medicine

## 2016-08-08 ENCOUNTER — Ambulatory Visit (INDEPENDENT_AMBULATORY_CARE_PROVIDER_SITE_OTHER): Payer: Medicare HMO | Admitting: Family Medicine

## 2016-08-08 VITALS — BP 132/82 | HR 86 | Wt 162.0 lb

## 2016-08-08 DIAGNOSIS — F3341 Major depressive disorder, recurrent, in partial remission: Secondary | ICD-10-CM | POA: Diagnosis not present

## 2016-08-08 MED ORDER — QUETIAPINE FUMARATE 50 MG PO TABS: 50.0000 mg | ORAL_TABLET | Freq: Every day | ORAL | 1 refills | Status: DC

## 2016-08-08 NOTE — Progress Notes (Signed)
BP 132/82   Pulse 86   Wt 162 lb (73.5 kg)   LMP 09/13/1975 (Approximate)   SpO2 95%   BMI 27.81 kg/m    Subjective:    Patient ID: Amy Rocha, female    DOB: 09-Apr-1955, 61 y.o.   MRN: [id number]  HPI: [name] is a 61 y.o. female  Chief Complaint  Patient presents with  . Depression   Patient presents today for depression follow up. Feels like her moods are doing much better on the trintellix/wellbutrin combination but she is still having a small amount of anxiousness. Denies SI/HI, anhedonia, appetite changes, but does have some issue sleeping well due to nerves.   Depression screen [facility] 2/9 08/08/2016 05/08/2016 09/21/2015  Decreased Interest 2 2 0  Down, Depressed, Hopeless 1 1 0  PHQ - 2 Score 3 3 0  Altered sleeping 1 3 -  Tired, decreased energy 3 3 -  Change in appetite 0 3 -  Feeling bad or failure about yourself  0 0 -  Trouble concentrating 0 0 -  Moving slowly or fidgety/restless 0 0 -  Suicidal thoughts 0 0 -  PHQ-9 Score 7 12 -   Past Medical History:  Diagnosis Date  . Anxiety   . Arthritis   . Asthma   . Avascular necrosis ([place])   . Blood transfusion without reported diagnosis   . Calculus of kidney   . CHF (congestive heart failure) ([place])   . Chronic pain syndrome   . COPD (chronic obstructive pulmonary disease) ([place])   . Depression   . GERD (gastroesophageal reflux disease)   . Heart failure ([place])   . Hyperlipidemia   . Hypertension   . IBS (irritable bowel syndrome)   . Joint pain   . Osteoporosis   . Oxygen deficiency   . Peripheral vascular disease ([place])   . Urge incontinence    Social History   Social History  . Marital status: Married    Spouse name: N/A  . Number of children: N/A  . Years of education: N/A   Occupational History  . Not on file.   Social History Main Topics  . Smoking status: Former Research scientist (life sciences)  . Smokeless tobacco: Never Used  . Alcohol use No  . Drug use: No  . Sexual activity: Yes   Other  Topics Concern  . Not on file   Social History Narrative  . No narrative on file   Relevant past medical, surgical, family and social history reviewed and updated as indicated. Interim medical history since our last visit reviewed. Allergies and medications reviewed and updated.  Review of Systems  Constitutional: Negative.   HENT: Negative.   Respiratory: Negative.   Cardiovascular: Negative.   Gastrointestinal: Negative.   Genitourinary: Negative.   Musculoskeletal: Negative.   Neurological: Negative.   Psychiatric/Behavioral: The patient is nervous/anxious.    Per HPI unless specifically indicated above     Objective:    BP 132/82   Pulse 86   Wt 162 lb (73.5 kg)   LMP 09/13/1975 (Approximate)   SpO2 95%   BMI 27.81 kg/m   Wt Readings from Last 3 Encounters:  08/08/16 162 lb (73.5 kg)  05/08/16 165 lb (74.8 kg)  03/15/16 162 lb (73.5 kg)    Physical Exam  Constitutional: She is oriented to person, place, and time. She appears well-developed and well-nourished. No distress.  HENT:  Head: Atraumatic.  Eyes: Conjunctivae are normal. Pupils are equal, round, and reactive  to light. No scleral icterus.  Neck: Normal range of motion. Neck supple.  Cardiovascular: Normal rate and normal heart sounds.   Pulmonary/Chest: Effort normal and breath sounds normal. No respiratory distress.  Musculoskeletal: Normal range of motion.  Neurological: She is alert and oriented to person, place, and time.  Skin: Skin is warm and dry.  Psychiatric: She has a normal mood and affect. Her behavior is normal. Judgment and thought content normal.  Nursing note and vitals reviewed.     Assessment & Plan:   Problem List Items Addressed This Visit      Other   Depression - Primary    Continue trintellix and wellbutrin, will add low dose seroquel to help her sleep and nerves as pt has done very poorly on BZDs in the past. F/u in 6 weeks for recheck          Follow up plan: Return  in about 6 weeks (around 09/19/2016) for CPE, Depression f/u.

## 2016-08-08 NOTE — Assessment & Plan Note (Signed)
Continue trintellix and wellbutrin, will add low dose seroquel to help her sleep and nerves as pt has done very poorly on BZDs in the past. F/u in 6 weeks for recheck

## 2016-08-20 DIAGNOSIS — M533 Sacrococcygeal disorders, not elsewhere classified: Secondary | ICD-10-CM | POA: Diagnosis not present

## 2016-08-20 DIAGNOSIS — M5416 Radiculopathy, lumbar region: Secondary | ICD-10-CM | POA: Diagnosis not present

## 2016-08-20 DIAGNOSIS — M47817 Spondylosis without myelopathy or radiculopathy, lumbosacral region: Secondary | ICD-10-CM | POA: Diagnosis not present

## 2016-08-20 DIAGNOSIS — M791 Myalgia: Secondary | ICD-10-CM | POA: Diagnosis not present

## 2016-08-27 ENCOUNTER — Encounter: Payer: Self-pay | Admitting: Family Medicine

## 2016-08-27 ENCOUNTER — Ambulatory Visit (INDEPENDENT_AMBULATORY_CARE_PROVIDER_SITE_OTHER): Payer: Medicare HMO | Admitting: Family Medicine

## 2016-08-27 VITALS — BP 168/102 | HR 88 | Temp 97.8°F | Wt 162.0 lb

## 2016-08-27 DIAGNOSIS — R197 Diarrhea, unspecified: Secondary | ICD-10-CM

## 2016-08-27 DIAGNOSIS — J069 Acute upper respiratory infection, unspecified: Secondary | ICD-10-CM | POA: Diagnosis not present

## 2016-08-27 MED ORDER — DICYCLOMINE HCL 20 MG PO TABS: 20.0000 mg | ORAL_TABLET | Freq: Four times a day (QID) | ORAL | 3 refills | Status: DC

## 2016-08-27 MED ORDER — AMOXICILLIN-POT CLAVULANATE 875-125 MG PO TABS: 1.0000 | ORAL_TABLET | Freq: Two times a day (BID) | ORAL | 0 refills | Status: DC

## 2016-08-27 MED ORDER — BENZONATATE 100 MG PO CAPS: 200.0000 mg | ORAL_CAPSULE | Freq: Three times a day (TID) | ORAL | 0 refills | Status: DC | PRN

## 2016-08-27 MED ORDER — FLUTICASONE PROPIONATE 50 MCG/ACT NA SUSP: 2.0000 | Freq: Every day | NASAL | 6 refills | Status: DC

## 2016-08-27 MED ORDER — PREDNISONE 20 MG PO TABS: 40.0000 mg | ORAL_TABLET | Freq: Every day | ORAL | 0 refills | Status: DC

## 2016-08-27 NOTE — Telephone Encounter (Signed)
NA

## 2016-08-27 NOTE — Progress Notes (Signed)
BP (!) 168/102   Pulse 88   Temp 97.8 F (36.6 C)   Wt 162 lb (73.5 kg)   LMP 09/13/1975 (Approximate)   SpO2 97%   BMI 27.81 kg/m    Subjective:    Patient ID: Amy Rocha, female    DOB: 1955-06-13, 61 y.o.   MRN: [id number]  HPI: Amy Rocha is a 61 y.o. female  Chief Complaint  Patient presents with  . Nasal Congestion    x 5 days, head/chest congestion, non productive cough, slight sore throat, achey. One day a temp of 100 possibly.  . Irritable Bowel Syndrome    past couple of weeks, if she eats fruits it tears her stomach up and gives her diarrhea. Wonders if there is something she can take for it.   Patient presents with 5 days of worsening congestion, dry hacking cough, chest tightness, and generalized body aches/low grade fevers. Has tried inhalers, OTC allergy medications, nasal sprays with no relief. Long hx of sinus and allergy problems, is s/p recent sinus procedure. States her granddaughter had been sick a little over a week ago and she had stayed the night at her house at that time.   Also c/o urgent diarrhea every time she eats certain things, most notably at this time any melon fruit. She gets abdominal cramping and has to run to the bathroom just shortly after consuming certain things. Has not tried any OTC remedies at this time.   Past Medical History:  Diagnosis Date  . Anxiety   . Arthritis   . Asthma   . Avascular necrosis ([place])   . Blood transfusion without reported diagnosis   . Calculus of kidney   . CHF (congestive heart failure) ([place])   . Chronic pain syndrome   . COPD (chronic obstructive pulmonary disease) ([place])   . Depression   . GERD (gastroesophageal reflux disease)   . Heart failure ([place])   . Hyperlipidemia   . Hypertension   . IBS (irritable bowel syndrome)   . Joint pain   . Osteoporosis   . Oxygen deficiency   . Peripheral vascular disease ([place])   . Urge incontinence    Social History   Social History  . Marital  status: Married    Spouse name: N/A  . Number of children: N/A  . Years of education: N/A   Occupational History  . Not on file.   Social History Main Topics  . Smoking status: Former Research scientist (life sciences)  . Smokeless tobacco: Never Used  . Alcohol use No  . Drug use: No  . Sexual activity: Yes   Other Topics Concern  . Not on file   Social History Narrative  . No narrative on file   Relevant past medical, surgical, family and social history reviewed and updated as indicated. Interim medical history since our last visit reviewed. Allergies and medications reviewed and updated.  Review of Systems  Constitutional: Positive for fever.  HENT: Positive for congestion, sinus pressure and sore throat.   Eyes: Negative.   Respiratory: Positive for cough and chest tightness.   Cardiovascular: Negative.   Gastrointestinal: Positive for abdominal pain and diarrhea.  Genitourinary: Negative.   Musculoskeletal: Positive for myalgias.  Neurological: Negative.   Psychiatric/Behavioral: Negative.    Per HPI unless specifically indicated above     Objective:    BP (!) 168/102   Pulse 88   Temp 97.8 F (36.6 C)   Wt 162 lb (73.5 kg)   LMP 09/13/1975 (  Approximate)   SpO2 97%   BMI 27.81 kg/m   Wt Readings from Last 3 Encounters:  08/27/16 162 lb (73.5 kg)  08/08/16 162 lb (73.5 kg)  05/08/16 165 lb (74.8 kg)    Physical Exam  Constitutional: She is oriented to person, place, and time. She appears well-developed and well-nourished. No distress.  HENT:  Head: Atraumatic.  Right Ear: External ear normal.  Left Ear: External ear normal.  Nasal mucosa injected Oropharynx erythematous  Eyes: Pupils are equal, round, and reactive to light. Conjunctivae are normal. No scleral icterus.  Neck: Normal range of motion. Neck supple.  Cardiovascular: Normal rate and normal heart sounds.   Pulmonary/Chest: Effort normal and breath sounds normal. No respiratory distress.  Musculoskeletal: Normal  range of motion.  Lymphadenopathy:    She has no cervical adenopathy.  Neurological: She is alert and oriented to person, place, and time.  Skin: Skin is warm and dry.  Psychiatric: She has a normal mood and affect. Her behavior is normal.  Nursing note and vitals reviewed.     Assessment & Plan:   Problem List Items Addressed This Visit    None    Visit Diagnoses    Upper respiratory tract infection, unspecified type    -  Primary   Will treat with augmentin, prednisone burst, and albuterol inhaler. Continue OTC meds for symptomatic relief prn. Supportive care reviewed.    Diarrhea, unspecified type       Suspect IBS, will try bentyl and slowly re-introduce trigger foods. Can try imodium prn, and start daily probiotic supplement       Follow up plan: Return for as scheduled.

## 2016-09-04 ENCOUNTER — Telehealth: Payer: Self-pay | Admitting: Family Medicine

## 2016-09-04 DIAGNOSIS — J329 Chronic sinusitis, unspecified: Secondary | ICD-10-CM | POA: Diagnosis not present

## 2016-09-04 NOTE — Telephone Encounter (Signed)
Called pt to schedule Annual Wellness Visit with NHA  Spoke to husband he said to call her back tomorrow when she returns - knb

## 2016-09-05 NOTE — Telephone Encounter (Signed)
Called and lm

## 2016-09-12 ENCOUNTER — Ambulatory Visit (INDEPENDENT_AMBULATORY_CARE_PROVIDER_SITE_OTHER): Payer: Medicare HMO | Admitting: Family Medicine

## 2016-09-12 ENCOUNTER — Encounter: Payer: Self-pay | Admitting: Family Medicine

## 2016-09-12 ENCOUNTER — Ambulatory Visit (INDEPENDENT_AMBULATORY_CARE_PROVIDER_SITE_OTHER): Payer: Medicare HMO

## 2016-09-12 VITALS — BP 124/60 | HR 87 | Temp 97.8°F | Resp 16 | Ht 64.0 in | Wt 159.0 lb

## 2016-09-12 VITALS — BP 124/60 | HR 87 | Temp 97.8°F | Resp 16 | Ht 64.0 in | Wt 159.1 lb

## 2016-09-12 DIAGNOSIS — Z1231 Encounter for screening mammogram for malignant neoplasm of breast: Secondary | ICD-10-CM | POA: Diagnosis not present

## 2016-09-12 DIAGNOSIS — Z0000 Encounter for general adult medical examination without abnormal findings: Secondary | ICD-10-CM

## 2016-09-12 DIAGNOSIS — N390 Urinary tract infection, site not specified: Secondary | ICD-10-CM | POA: Diagnosis not present

## 2016-09-12 DIAGNOSIS — R399 Unspecified symptoms and signs involving the genitourinary system: Secondary | ICD-10-CM | POA: Diagnosis not present

## 2016-09-12 DIAGNOSIS — Z1239 Encounter for other screening for malignant neoplasm of breast: Secondary | ICD-10-CM

## 2016-09-12 MED ORDER — SULFAMETHOXAZOLE-TRIMETHOPRIM 800-160 MG PO TABS: 1.0000 | ORAL_TABLET | Freq: Two times a day (BID) | ORAL | 0 refills | Status: DC

## 2016-09-12 NOTE — Patient Instructions (Signed)
Follow up as needed

## 2016-09-12 NOTE — Patient Instructions (Addendum)
Amy Rocha , Thank you for taking time to come for your Medicare Wellness Visit. I appreciate your ongoing commitment to your health goals. Please review the following plan we discussed and let me know if I can assist you in the future.   Screening recommendations/referrals: Colonoscopy: Completed 11/29/2013 Mammogram: completed 12/14/2013 Bone Density: Completed 09/29/2012 Recommended yearly ophthalmology/optometry visit for glaucoma screening and checkup Recommended yearly dental visit for hygiene and checkup  Vaccinations: Influenza vaccine: up to date, due 10/2016 Pneumococcal vaccine: due at age 11 Tdap vaccine: up to date  Shingles vaccine: up to date   Advanced directives: declined  Conditions/risks identified: Recommend drinking at least 5-6 glasses of water a day   Next appointment: Follow up with Amy Rocha on 09/25/2016 at 1:00pm. Follow up in one year for your annual wellness exam.   Preventive Care 40-64 Years, Female Preventive care refers to lifestyle choices and visits with your health care provider that can promote health and wellness. What does preventive care include?  A yearly physical exam. This is also called an annual well check.  Dental exams once or twice a year.  Routine eye exams. Ask your health care provider how often you should have your eyes checked.  Personal lifestyle choices, including:  Daily care of your teeth and gums.  Regular physical activity.  Eating a healthy diet.  Avoiding tobacco and drug use.  Limiting alcohol use.  Practicing safe sex.  Taking low-dose aspirin daily starting at age 4.  Taking vitamin and mineral supplements as recommended by your health care provider. What happens during an annual well check? The services and screenings done by your health care provider during your annual well check will depend on your age, overall health, lifestyle risk factors, and family history of disease. Counseling  Your  health care provider may ask you questions about your:  Alcohol use.  Tobacco use.  Drug use.  Emotional well-being.  Home and relationship well-being.  Sexual activity.  Eating habits.  Work and work Statistician.  Method of birth control.  Menstrual cycle.  Pregnancy history. Screening  You may have the following tests or measurements:  Height, weight, and BMI.  Blood pressure.  Lipid and cholesterol levels. These may be checked every 5 years, or more frequently if you are over 76 years old.  Skin check.  Lung cancer screening. You may have this screening every year starting at age 13 if you have a 30-pack-year history of smoking and currently smoke or have quit within the past 15 years.  Fecal occult blood test (FOBT) of the stool. You may have this test every year starting at age 8.  Flexible sigmoidoscopy or colonoscopy. You may have a sigmoidoscopy every 5 years or a colonoscopy every 10 years starting at age 73.  Hepatitis C blood test.  Hepatitis B blood test.  Sexually transmitted disease (STD) testing.  Diabetes screening. This is done by checking your blood sugar (glucose) after you have not eaten for a while (fasting). You may have this done every 1-3 years.  Mammogram. This may be done every 1-2 years. Talk to your health care provider about when you should start having regular mammograms. This may depend on whether you have a family history of breast cancer.  BRCA-related cancer screening. This may be done if you have a family history of breast, ovarian, tubal, or peritoneal cancers.  Pelvic exam and Pap test. This may be done every 3 years starting at age 7. Starting at age  30, this may be done every 5 years if you have a Pap test in combination with an HPV test.  Bone density scan. This is done to screen for osteoporosis. You may have this scan if you are at high risk for osteoporosis. Discuss your test results, treatment options, and if  necessary, the need for more tests with your health care provider. Vaccines  Your health care provider may recommend certain vaccines, such as:  Influenza vaccine. This is recommended every year.  Tetanus, diphtheria, and acellular pertussis (Tdap, Td) vaccine. You may need a Td booster every 10 years.  Zoster vaccine. You may need this after age 70.  Pneumococcal 13-valent conjugate (PCV13) vaccine. You may need this if you have certain conditions and were not previously vaccinated.  Pneumococcal polysaccharide (PPSV23) vaccine. You may need one or two doses if you smoke cigarettes or if you have certain conditions. Talk to your health care provider about which screenings and vaccines you need and how often you need them. This information is not intended to replace advice given to you by your health care provider. Make sure you discuss any questions you have with your health care provider. Document Released: 03/03/2015 Document Revised: 10/25/2015 Document Reviewed: 12/06/2014 Elsevier Interactive Patient Education  2017 [place] Prevention in the Home Falls can cause injuries. They can happen to people of all ages. There are many things you can do to make your home safe and to help prevent falls. What can I do on the outside of my home?  Regularly fix the edges of walkways and driveways and fix any cracks.  Remove anything that might make you trip as you walk through a door, such as a raised step or threshold.  Trim any bushes or trees on the path to your home.  Use bright outdoor lighting.  Clear any walking paths of anything that might make someone trip, such as rocks or tools.  Regularly check to see if handrails are loose or broken. Make sure that both sides of any steps have handrails.  Any raised decks and porches should have guardrails on the edges.  Have any leaves, snow, or ice cleared regularly.  Use sand or salt on walking paths during  winter.  Clean up any spills in your garage right away. This includes oil or grease spills. What can I do in the bathroom?  Use night lights.  Install grab bars by the toilet and in the tub and shower. Do not use towel bars as grab bars.  Use non-skid mats or decals in the tub or shower.  If you need to sit down in the shower, use a plastic, non-slip stool.  Keep the floor dry. Clean up any water that spills on the floor as soon as it happens.  Remove soap buildup in the tub or shower regularly.  Attach bath mats securely with double-sided non-slip rug tape.  Do not have throw rugs and other things on the floor that can make you trip. What can I do in the bedroom?  Use night lights.  Make sure that you have a light by your bed that is easy to reach.  Do not use any sheets or blankets that are too big for your bed. They should not hang down onto the floor.  Have a firm chair that has side arms. You can use this for support while you get dressed.  Do not have throw rugs and other things on the floor that can  make you trip. What can I do in the kitchen?  Clean up any spills right away.  Avoid walking on wet floors.  Keep items that you use a lot in easy-to-reach places.  If you need to reach something above you, use a strong step stool that has a grab bar.  Keep electrical cords out of the way.  Do not use floor polish or wax that makes floors slippery. If you must use wax, use non-skid floor wax.  Do not have throw rugs and other things on the floor that can make you trip. What can I do with my stairs?  Do not leave any items on the stairs.  Make sure that there are handrails on both sides of the stairs and use them. Fix handrails that are broken or loose. Make sure that handrails are as long as the stairways.  Check any carpeting to make sure that it is firmly attached to the stairs. Fix any carpet that is loose or worn.  Avoid having throw rugs at the top or  bottom of the stairs. If you do have throw rugs, attach them to the floor with carpet tape.  Make sure that you have a light switch at the top of the stairs and the bottom of the stairs. If you do not have them, ask someone to add them for you. What else can I do to help prevent falls?  Wear shoes that:  Do not have high heels.  Have rubber bottoms.  Are comfortable and fit you well.  Are closed at the toe. Do not wear sandals.  If you use a stepladder:  Make sure that it is fully opened. Do not climb a closed stepladder.  Make sure that both sides of the stepladder are locked into place.  Ask someone to hold it for you, if possible.  Clearly mark and make sure that you can see:  Any grab bars or handrails.  First and last steps.  Where the edge of each step is.  Use tools that help you move around (mobility aids) if they are needed. These include:  Canes.  Walkers.  Scooters.  Crutches.  Turn on the lights when you go into a dark area. Replace any light bulbs as soon as they burn out.  Set up your furniture so you have a clear path. Avoid moving your furniture around.  If any of your floors are uneven, fix them.  If there are any pets around you, be aware of where they are.  Review your medicines with your doctor. Some medicines can make you feel dizzy. This can increase your chance of falling. Ask your doctor what other things that you can do to help prevent falls. This information is not intended to replace advice given to you by your health care provider. Make sure you discuss any questions you have with your health care provider. Document Released: 12/01/2008 Document Revised: 07/13/2015 Document Reviewed: 03/11/2014 Elsevier Interactive Patient Education  2017 [employer].

## 2016-09-12 NOTE — Progress Notes (Signed)
Subjective:   Amy Rocha is a 61 y.o. female who presents for Medicare Annual (Subsequent) preventive examination.  Review of Systems:  Cardiac Risk Factors include: advanced age (>15men, >67 women);dyslipidemia;hypertension     Objective:     Vitals: BP 124/60 (BP Location: Left Arm, Patient Position: Sitting)   Pulse 87   Temp 97.8 F (36.6 C)   Resp 16   Ht 5\' 4"  (1.626 m)   Wt 159 lb 1.6 oz (72.2 kg)   LMP 09/13/1975 (Approximate)   BMI 27.31 kg/m   Body mass index is 27.31 kg/m.   Tobacco History  Smoking Status  . Former Smoker  . Quit date: 02/19/1996  Smokeless Tobacco  . Never Used     Counseling given: Not Answered   Past Medical History:  Diagnosis Date  . Anxiety   . Arthritis   . Asthma   . Avascular necrosis ([place])   . Blood transfusion without reported diagnosis   . Calculus of kidney   . CHF (congestive heart failure) ([place])   . Chronic pain syndrome   . COPD (chronic obstructive pulmonary disease) ([place])   . Depression   . GERD (gastroesophageal reflux disease)   . Heart failure ([place])   . Hyperlipidemia   . Hypertension   . IBS (irritable bowel syndrome)   . Joint pain   . Osteoporosis   . Oxygen deficiency   . Peripheral vascular disease ([place])   . Urge incontinence    Past Surgical History:  Procedure Laterality Date  . ABDOMINAL HYSTERECTOMY    . avascular necrosis    . CARDIAC CATHETERIZATION Left 12/01/2015   Procedure: Left Heart Cath and Coronary Angiography;  Surgeon: Teodoro Spray, MD;  Location: [facility];  Service: Cardiovascular;  Laterality: Left;  . eyelid lift    . HERNIA REPAIR    . JOINT REPLACEMENT     hip  . NASAL SINUS SURGERY     Family History  Problem Relation Age of Onset  . Dementia Mother   . Cancer Father        colon  . Arthritis Brother   . Hypertension Daughter   . Aneurysm Brother    History  Sexual Activity  . Sexual activity: Yes    Outpatient Encounter Prescriptions as  of 09/12/2016  Medication Sig  . albuterol (PROVENTIL HFA;VENTOLIN HFA) 108 (90 Base) MCG/ACT inhaler Inhale 2 puffs into the lungs every 6 (six) hours as needed for wheezing or shortness of breath.  Marland Kitchen amLODipine (NORVASC) 5 MG tablet Take 1 tablet (5 mg total) by mouth daily.  Marland Kitchen atorvastatin (LIPITOR) 40 MG tablet TAKE 1 TABLET (40 MG TOTAL) BY MOUTH DAILY.  . budesonide-formoterol (SYMBICORT) 160-4.5 MCG/ACT inhaler Inhale 2 puffs into the lungs 2 (two) times daily.  Marland Kitchen buPROPion (WELLBUTRIN XL) 300 MG 24 hr tablet TAKE 1 TABLET BY MOUTH ONCE DAILY.  . cromolyn (OPTICROM) 4 % ophthalmic solution Place 1 drop into both eyes 4 (four) times daily.  Marland Kitchen dicyclomine (BENTYL) 20 MG tablet Take 1 tablet (20 mg total) by mouth every 6 (six) hours.  . fluticasone (FLONASE) 50 MCG/ACT nasal spray Place 2 sprays into both nostrils daily.  . Multiple Vitamins-Calcium (ONE-A-DAY WOMENS FORMULA PO) Take by mouth daily.  Marland Kitchen omeprazole (PRILOSEC) 20 MG capsule TAKE 1 CAPSULE BY MOUTH DAILY.  Marland Kitchen oxyCODONE (OXYCONTIN) 20 mg 12 hr tablet Take by mouth. Pt states she takes 8 tablets daily  . valACYclovir (VALTREX) 1000 MG tablet  Take 2 tablets (2,000 mg total) by mouth 2 (two) times daily.  Marland Kitchen vortioxetine HBr (TRINTELLIX) 5 MG TABS Take 1 tablet (5 mg total) by mouth daily.  Marland Kitchen loratadine (CLARITIN) 10 MG tablet Take 10 mg by mouth daily as needed.  . [DISCONTINUED] amoxicillin-clavulanate (AUGMENTIN) 875-125 MG tablet Take 1 tablet by mouth 2 (two) times daily. (Patient not taking: Reported on 09/12/2016)  . [DISCONTINUED] benzonatate (TESSALON) 100 MG capsule Take 2 capsules (200 mg total) by mouth 3 (three) times daily as needed. (Patient not taking: Reported on 09/12/2016)  . [DISCONTINUED] predniSONE (DELTASONE) 20 MG tablet Take 2 tablets (40 mg total) by mouth daily with breakfast. (Patient not taking: Reported on 09/12/2016)   Facility-Administered Encounter Medications as of 09/12/2016  Medication  . ciprofloxacin  (CIPRO) tablet 250 mg    Activities of Daily Living In your present state of health, do you have any difficulty performing the following activities: 09/12/2016  Hearing? N  Vision? Y  Difficulty concentrating or making decisions? Y  Walking or climbing stairs? N  Dressing or bathing? N  Doing errands, shopping? N  Preparing Food and eating ? N  Using the Toilet? N  In the past six months, have you accidently leaked urine? Y  Do you have problems with loss of bowel control? N  Managing your Medications? N  Managing your Finances? N  Housekeeping or managing your Housekeeping? N  Some recent data might be hidden    Patient Care Team: Volney American, PA-C as PCP - General (Family Medicine)    Assessment:     Exercise Activities and Dietary recommendations Current Exercise Habits: The patient does not participate in regular exercise at present, Exercise limited by: orthopedic condition(s)  Goals    . Increase water intake          Recommend drinking at least 5-6 glasses of water a day       Fall Risk Fall Risk  09/12/2016 08/08/2016 09/25/2015 09/21/2015 09/05/2015  Falls in the past year? No No No No No  Number falls in past yr: - - - - -  Injury with Fall? - - - - -  Risk Factor Category  - - - - -  Risk for fall due to : - - - - -  Follow up - - - - -   Depression Screen PHQ 2/9 Scores 09/12/2016 08/08/2016 05/08/2016 09/21/2015  PHQ - 2 Score 0 3 3 0  PHQ- 9 Score - 7 12 -  Exception Documentation - - - -     Cognitive Function     6CIT Screen 09/12/2016  What Year? 0 points  What month? 0 points  What time? 0 points  Count back from 20 0 points  Months in reverse 0 points  Repeat phrase 0 points  Total Score 0    Immunization History  Administered Date(s) Administered  . Influenza-Unspecified 10/29/2011, 12/30/2012, 12/02/2013, 01/30/2015, 11/22/2015  . Pneumococcal Polysaccharide-23 11/10/2009  . Td 04/12/2008  . Zoster 06/28/2015   Screening  Tests Health Maintenance  Topic Date Due  . HIV Screening  02/18/2017 (Originally 04/24/1970)  . MAMMOGRAM  12/13/2017 (Originally 12/15/2015)  . INFLUENZA VACCINE  09/18/2016  . TETANUS/TDAP  04/12/2018  . COLONOSCOPY  11/30/2023  . Hepatitis C Screening  Completed      Plan:     I have personally reviewed and addressed the Medicare Annual Wellness questionnaire and have noted the following in the patient's chart:  A. Medical and social  history B. Use of alcohol, tobacco or illicit drugs  C. Current medications and supplements D. Functional ability and status E.  Nutritional status F.  Physical activity G. Advance directives H. List of other physicians I.  Hospitalizations, surgeries, and ER visits in previous 12 months J.  [place] such as hearing and vision if needed, cognitive and depression L. Referrals and appointments   In addition, I have reviewed and discussed with patient certain preventive protocols, quality metrics, and best practice recommendations. A written personalized care plan for preventive services as well as general preventive health recommendations were provided to patient.   Signed,  Tyler Aas, LPN Nurse Health Advisor   MD Recommendations: Patient complaining of urinary frequency and burning that started yesterday morning. She started azo last night with no relief. Spoke with Wynona Dove- pt added to schedule for acute visit.

## 2016-09-12 NOTE — Progress Notes (Signed)
   BP 124/60   Pulse 87   Temp 97.8 F (36.6 C)   Resp 16   Ht 5\' 4"  (1.626 m)   Wt 159 lb (72.1 kg)   LMP 09/13/1975 (Approximate)   BMI 27.29 kg/m    Subjective:    Patient ID: Amy Rocha, female    DOB: 04/28/1955, 61 y.o.   MRN: [id number]  HPI: Amy Rocha is a 61 y.o. female  Chief Complaint  Patient presents with  . Urinary Tract Infection    x 2 days, frequency, dysuria.   2 day hx of dysuria and frequency. Has taken some AZO with some relief. Denies fever, chills, back pain, N/V/D. Long hx of UTIs.   Relevant past medical, surgical, family and social history reviewed and updated as indicated. Interim medical history since our last visit reviewed. Allergies and medications reviewed and updated.  Review of Systems  Constitutional: Negative.   Respiratory: Negative.   Cardiovascular: Negative.   Gastrointestinal: Negative.   Genitourinary: Positive for dysuria and frequency.  Musculoskeletal: Negative.   Skin: Negative.   Neurological: Negative.   Psychiatric/Behavioral: Negative.    Per HPI unless specifically indicated above     Objective:    BP 124/60   Pulse 87   Temp 97.8 F (36.6 C)   Resp 16   Ht 5\' 4"  (1.626 m)   Wt 159 lb (72.1 kg)   LMP 09/13/1975 (Approximate)   BMI 27.29 kg/m   Wt Readings from Last 3 Encounters:  09/12/16 159 lb (72.1 kg)  09/12/16 159 lb 1.6 oz (72.2 kg)  08/27/16 162 lb (73.5 kg)    Physical Exam  Constitutional: She is oriented to person, place, and time. She appears well-developed and well-nourished. No distress.  HENT:  Head: Atraumatic.  Eyes: Pupils are equal, round, and reactive to light. Conjunctivae are normal. No scleral icterus.  Neck: Normal range of motion. Neck supple.  Cardiovascular: Normal rate and normal heart sounds.   Pulmonary/Chest: Effort normal and breath sounds normal. No respiratory distress.  Musculoskeletal: Normal range of motion. She exhibits no tenderness (No CVA tenderness  b/l).  Neurological: She is alert and oriented to person, place, and time.  Skin: Skin is warm and dry.  Psychiatric: She has a normal mood and affect. Her behavior is normal.  Nursing note and vitals reviewed.     Assessment & Plan:   Problem List Items Addressed This Visit    None    Visit Diagnoses    Acute lower UTI    -  Primary   U/A + for UTI, await cx. Will start bactrim. Push fluids, probiotic. F/u if no improvement   Relevant Medications   sulfamethoxazole-trimethoprim (BACTRIM DS,SEPTRA DS) 800-160 MG tablet   Other Relevant Orders   UA/M w/rflx Culture, Routine (STAT)      Follow up plan: Return for as scheduled.

## 2016-09-13 DIAGNOSIS — N390 Urinary tract infection, site not specified: Secondary | ICD-10-CM | POA: Diagnosis not present

## 2016-09-13 NOTE — Addendum Note (Signed)
Addended byWende Mott J on: 09/13/2016 04:28 PM   Modules accepted: Orders

## 2016-09-16 LAB — URINE CULTURE

## 2016-09-17 DIAGNOSIS — M5416 Radiculopathy, lumbar region: Secondary | ICD-10-CM | POA: Diagnosis not present

## 2016-09-17 DIAGNOSIS — M791 Myalgia: Secondary | ICD-10-CM | POA: Diagnosis not present

## 2016-09-17 DIAGNOSIS — M533 Sacrococcygeal disorders, not elsewhere classified: Secondary | ICD-10-CM | POA: Diagnosis not present

## 2016-09-17 DIAGNOSIS — M47817 Spondylosis without myelopathy or radiculopathy, lumbosacral region: Secondary | ICD-10-CM | POA: Diagnosis not present

## 2016-09-17 LAB — UA/M W/RFLX CULTURE, ROUTINE
Bilirubin, UA: NEGATIVE
NITRITE UA: POSITIVE — AB
Specific Gravity, UA: 1.02 (ref 1.005–1.030)
Urobilinogen, Ur: 1 mg/dL (ref 0.2–1.0)
pH, UA: 5 (ref 5.0–7.5)

## 2016-09-17 LAB — MICROSCOPIC EXAMINATION

## 2016-09-25 ENCOUNTER — Ambulatory Visit (INDEPENDENT_AMBULATORY_CARE_PROVIDER_SITE_OTHER): Payer: Medicare HMO | Admitting: Family Medicine

## 2016-09-25 ENCOUNTER — Encounter: Payer: Self-pay | Admitting: Family Medicine

## 2016-09-25 VITALS — BP 138/90 | HR 83 | Temp 97.9°F | Ht 64.0 in | Wt 162.0 lb

## 2016-09-25 DIAGNOSIS — N184 Chronic kidney disease, stage 4 (severe): Secondary | ICD-10-CM | POA: Diagnosis not present

## 2016-09-25 DIAGNOSIS — Z1231 Encounter for screening mammogram for malignant neoplasm of breast: Secondary | ICD-10-CM

## 2016-09-25 DIAGNOSIS — I70209 Unspecified atherosclerosis of native arteries of extremities, unspecified extremity: Secondary | ICD-10-CM

## 2016-09-25 DIAGNOSIS — Z0000 Encounter for general adult medical examination without abnormal findings: Secondary | ICD-10-CM

## 2016-09-25 DIAGNOSIS — I10 Essential (primary) hypertension: Secondary | ICD-10-CM | POA: Diagnosis not present

## 2016-09-25 NOTE — Assessment & Plan Note (Signed)
Await labs, reviewed importance of good blood pressure control, hydration, and lifestyle modifications

## 2016-09-25 NOTE — Assessment & Plan Note (Signed)
Await lipid results today. Continue good lifestyle modifications

## 2016-09-25 NOTE — Progress Notes (Signed)
BP 138/90   Pulse 83   Temp 97.9 F (36.6 C)   Ht 5\' 4"  (1.626 m)   Wt 162 lb (73.5 kg)   LMP 09/13/1975 (Approximate)   SpO2 97%   BMI 27.81 kg/m    Subjective:    Patient ID: Amy Rocha, female    DOB: Jun 20, 1955, 61 y.o.   MRN: [id number]  HPI: Amy Rocha is a 61 y.o. female presenting on 09/25/2016 for comprehensive medical examination. Current medical complaints include:none  Not fasting today for labs.   Menopausal Symptoms: no  Depression Screen done today and results listed below:  Depression screen [facility] 2/9 09/25/2016 09/12/2016 08/08/2016 05/08/2016 09/21/2015  Decreased Interest 0 0 2 2 0  Down, Depressed, Hopeless 0 0 1 1 0  PHQ - 2 Score 0 0 3 3 0  Altered sleeping - - 1 3 -  Tired, decreased energy - - 3 3 -  Change in appetite - - 0 3 -  Feeling bad or failure about yourself  - - 0 0 -  Trouble concentrating - - 0 0 -  Moving slowly or fidgety/restless - - 0 0 -  Suicidal thoughts - - 0 0 -  PHQ-9 Score - - 7 12 -    The patient does not have a history of falls. I did not complete a risk assessment for falls. A plan of care for falls was not documented.   Past Medical History:  Past Medical History:  Diagnosis Date  . Anxiety   . Arthritis   . Asthma   . Avascular necrosis ([place])   . Blood transfusion without reported diagnosis   . Calculus of kidney   . CHF (congestive heart failure) ([place])   . Chronic pain syndrome   . COPD (chronic obstructive pulmonary disease) ([place])   . Depression   . GERD (gastroesophageal reflux disease)   . Heart failure ([place])   . Hyperlipidemia   . Hypertension   . IBS (irritable bowel syndrome)   . Joint pain   . Osteoporosis   . Oxygen deficiency   . Peripheral vascular disease ([place])   . Urge incontinence     Surgical History:  Past Surgical History:  Procedure Laterality Date  . ABDOMINAL HYSTERECTOMY    . avascular necrosis    . CARDIAC CATHETERIZATION Left 12/01/2015   Procedure: Left Heart Cath and  Coronary Angiography;  Surgeon: Teodoro Spray, MD;  Location: [facility] [facility];  Service: Cardiovascular;  Laterality: Left;  . eyelid lift    . HERNIA REPAIR    . JOINT REPLACEMENT     hip  . NASAL SINUS SURGERY      Medications:  Current Outpatient Prescriptions on File Prior to Visit  Medication Sig  . albuterol (PROVENTIL HFA;VENTOLIN HFA) 108 (90 Base) MCG/ACT inhaler Inhale 2 puffs into the lungs every 6 (six) hours as needed for wheezing or shortness of breath.  Marland Kitchen amLODipine (NORVASC) 5 MG tablet Take 1 tablet (5 mg total) by mouth daily.  Marland Kitchen atorvastatin (LIPITOR) 40 MG tablet TAKE 1 TABLET (40 MG TOTAL) BY MOUTH DAILY.  . budesonide-formoterol (SYMBICORT) 160-4.5 MCG/ACT inhaler Inhale 2 puffs into the lungs 2 (two) times daily.  Marland Kitchen buPROPion (WELLBUTRIN XL) 300 MG 24 hr tablet TAKE 1 TABLET BY MOUTH ONCE DAILY.  . cromolyn (OPTICROM) 4 % ophthalmic solution Place 1 drop into both eyes 4 (four) times daily.  Marland Kitchen dicyclomine (BENTYL) 20 MG tablet Take 1 tablet (20 mg  total) by mouth every 6 (six) hours.  . fluticasone (FLONASE) 50 MCG/ACT nasal spray Place 2 sprays into both nostrils daily.  Marland Kitchen loratadine (CLARITIN) 10 MG tablet Take 10 mg by mouth daily as needed.  . Multiple Vitamins-Calcium (ONE-A-DAY WOMENS FORMULA PO) Take by mouth daily.  Marland Kitchen omeprazole (PRILOSEC) 20 MG capsule TAKE 1 CAPSULE BY MOUTH DAILY.  Marland Kitchen oxyCODONE (OXYCONTIN) 20 mg 12 hr tablet Take by mouth. Pt states she takes 8 tablets daily  . valACYclovir (VALTREX) 1000 MG tablet Take 2 tablets (2,000 mg total) by mouth 2 (two) times daily.  Marland Kitchen vortioxetine HBr (TRINTELLIX) 5 MG TABS Take 1 tablet (5 mg total) by mouth daily.   Current Facility-Administered Medications on File Prior to Visit  Medication  . ciprofloxacin (CIPRO) tablet 250 mg    Allergies:  Allergies  Allergen Reactions  . Lactose Intolerance (Gi)     Bloating, GI upset, diarrhea  . Tetracyclines & Related     Social History:  Social  History   Social History  . Marital status: Married    Spouse name: N/A  . Number of children: N/A  . Years of education: N/A   Occupational History  . Not on file.   Social History Main Topics  . Smoking status: Former Smoker    Quit date: 02/19/1996  . Smokeless tobacco: Never Used  . Alcohol use No  . Drug use: No  . Sexual activity: Yes   Other Topics Concern  . Not on file   Social History Narrative  . No narrative on file   History  Smoking Status  . Former Smoker  . Quit date: 02/19/1996  Smokeless Tobacco  . Never Used   History  Alcohol Use No    Family History:  Family History  Problem Relation Age of Onset  . Dementia Mother   . Cancer Father        colon  . Arthritis Brother   . Hypertension Daughter   . Aneurysm Brother     Past medical history, surgical history, medications, allergies, family history and social history reviewed with patient today and changes made to appropriate areas of the chart.   Review of Systems - General ROS: negative Psychological ROS: negative Ophthalmic ROS: negative ENT ROS: negative Endocrine ROS: negative Breast ROS: negative for breast lumps Respiratory ROS: no cough, shortness of breath, or wheezing Cardiovascular ROS: no chest pain or dyspnea on exertion Gastrointestinal ROS: no abdominal pain, change in bowel habits, or black or bloody stools Genito-Urinary ROS: no dysuria, trouble voiding, or hematuria Musculoskeletal ROS: negative Neurological ROS: no TIA or stroke symptoms Dermatological ROS: negative All other ROS negative except what is listed above and in the HPI.      Objective:    BP 138/90   Pulse 83   Temp 97.9 F (36.6 C)   Ht 5\' 4"  (1.626 m)   Wt 162 lb (73.5 kg)   LMP 09/13/1975 (Approximate)   SpO2 97%   BMI 27.81 kg/m   Wt Readings from Last 3 Encounters:  09/25/16 162 lb (73.5 kg)  09/12/16 159 lb (72.1 kg)  09/12/16 159 lb 1.6 oz (72.2 kg)    Physical Exam  Results for  orders placed or performed in visit on 09/12/16  Urine Culture  Result Value Ref Range   Urine Culture, Routine Final report (A)    Organism ID, Bacteria Escherichia coli (A)    Antimicrobial Susceptibility Comment   Microscopic Examination  Result Value Ref Range  WBC, UA >30 (A) 0 - 5 /hpf   RBC, UA 0-2 0 - 2 /hpf   Epithelial Cells (non renal) 0-10 0 - 10 /hpf   Renal Epithel, UA 0-10 (A) None seen /hpf   Casts Present (A) None seen /lpf   Cast Type Red cell casts (A) N/A   Bacteria, UA Few None seen/Few  UA/M w/rflx Culture, Routine (STAT)  Result Value Ref Range   Specific Gravity, UA 1.020 1.005 - 1.030   pH, UA 5.0 5.0 - 7.5   Color, UA Orange Yellow   Appearance Ur Turbid (A) Clear   Leukocytes, UA 1+ (A) Negative   Protein, UA 1+ (A) Negative/Trace   Glucose, UA Trace (A) Negative   Ketones, UA Trace (A) Negative   RBC, UA 1+ (A) Negative   Bilirubin, UA Negative Negative   Urobilinogen, Ur 1.0 0.2 - 1.0 mg/dL   Nitrite, UA Positive (A) Negative   Microscopic Examination See below:       Assessment & Plan:   Problem List Items Addressed This Visit      Cardiovascular and Mediastinum   Atherosclerotic peripheral vascular disease ([place])    Await lipid results today. Continue good lifestyle modifications      Relevant Orders   Lipid Panel w/o Chol/HDL Ratio   Hypertension    Stable and WNL, continue current regimen as well as DASH diet and continued adherence with Silver Sneakers workout program      Relevant Orders   UA/M w/rflx Culture, Routine     Genitourinary   CKD (chronic kidney disease), stage IV ([place])    Await labs, reviewed importance of good blood pressure control, hydration, and lifestyle modifications      Relevant Orders   CBC with Differential/Platelet   Comprehensive metabolic panel    Other Visit Diagnoses    Annual physical exam    -  Primary   Screening mammogram, encounter for           Follow up plan: Return in about 6  months (around 03/28/2017) for BP, Chol.   LABORATORY TESTING:  - Pap smear: not applicable  IMMUNIZATIONS:   - Tdap: Tetanus vaccination status reviewed: last tetanus booster within 10 years. - Influenza: Postponed to flu season  SCREENING: -Mammogram: Ordered today  - Colonoscopy: Up to date   PATIENT COUNSELING:   Advised to take 1 mg of folate supplement per day if capable of pregnancy.   Sexuality: Discussed sexually transmitted diseases, partner selection, use of condoms, avoidance of unintended pregnancy  and contraceptive alternatives.   Advised to avoid cigarette smoking.  I discussed with the patient that most people either abstain from alcohol or drink within safe limits (<=14/week and <=4 drinks/occasion for males, <=7/weeks and <= 3 drinks/occasion for females) and that the risk for alcohol disorders and other health effects rises proportionally with the number of drinks per week and how often a drinker exceeds daily limits.  Discussed cessation/primary prevention of drug use and availability of treatment for abuse.   Diet: Encouraged to adjust caloric intake to maintain  or achieve ideal body weight, to reduce intake of dietary saturated fat and total fat, to limit sodium intake by avoiding high sodium foods and not adding table salt, and to maintain adequate dietary potassium and calcium preferably from fresh fruits, vegetables, and low-fat dairy products.    stressed the importance of regular exercise  Injury prevention: Discussed safety belts, safety helmets, smoke detector, smoking near bedding or upholstery.  Dental health: Discussed importance of regular tooth brushing, flossing, and dental visits.   NEXT PREVENTATIVE PHYSICAL DUE IN 1 YEAR. Return in about 6 months (around 03/28/2017) for BP, Chol.

## 2016-09-25 NOTE — Patient Instructions (Signed)
Follow up in 6 months for fasting cholesterol recheck as well as blood pressure follow up

## 2016-09-25 NOTE — Assessment & Plan Note (Addendum)
Stable and WNL, continue current regimen as well as DASH diet and continued adherence with Silver Sne[employer] workout program

## 2016-09-26 LAB — COMPREHENSIVE METABOLIC PANEL
A/G RATIO: 1.6 (ref 1.2–2.2)
ALK PHOS: 119 IU/L — AB (ref 39–117)
ALT: 24 IU/L (ref 0–32)
AST: 28 IU/L (ref 0–40)
Albumin: 4.1 g/dL (ref 3.6–4.8)
BUN/Creatinine Ratio: 10 — ABNORMAL LOW (ref 12–28)
BUN: 11 mg/dL (ref 8–27)
CALCIUM: 9.7 mg/dL (ref 8.7–10.3)
CHLORIDE: 103 mmol/L (ref 96–106)
CO2: 23 mmol/L (ref 20–29)
Creatinine, Ser: 1.08 mg/dL — ABNORMAL HIGH (ref 0.57–1.00)
GFR calc Af Amer: 63 mL/min/{1.73_m2} (ref >59)
GFR calc non Af Amer: 55 mL/min/{1.73_m2} — ABNORMAL LOW (ref >59)
Globulin, Total: 2.5 g/dL (ref 1.5–4.5)
Glucose: 83 mg/dL (ref 65–99)
POTASSIUM: 4.7 mmol/L (ref 3.5–5.2)
Sodium: 144 mmol/L (ref 134–144)
Total Protein: 6.6 g/dL (ref 6.0–8.5)

## 2016-09-26 LAB — LIPID PANEL W/O CHOL/HDL RATIO
Cholesterol, Total: 184 mg/dL (ref 100–199)
HDL: 68 mg/dL (ref >39)
LDL Calculated: 55 mg/dL (ref 0–99)
TRIGLYCERIDES: 305 mg/dL — AB (ref 0–149)
VLDL Cholesterol Cal: 61 mg/dL — ABNORMAL HIGH (ref 5–40)

## 2016-09-26 LAB — CBC WITH DIFFERENTIAL/PLATELET
BASOS: 1 %
Basophils Absolute: 0.1 10*3/uL (ref 0.0–0.2)
EOS (ABSOLUTE): 0.3 10*3/uL (ref 0.0–0.4)
Eos: 4 %
Hematocrit: 43.8 % (ref 34.0–46.6)
Hemoglobin: 14.4 g/dL (ref 11.1–15.9)
IMMATURE GRANULOCYTES: 0 %
Immature Grans (Abs): 0 10*3/uL (ref 0.0–0.1)
Lymphocytes Absolute: 2.2 10*3/uL (ref 0.7–3.1)
Lymphs: 30 %
MCH: 29.6 pg (ref 26.6–33.0)
MCHC: 32.9 g/dL (ref 31.5–35.7)
MCV: 90 fL (ref 79–97)
MONOCYTES: 10 %
MONOS ABS: 0.8 10*3/uL (ref 0.1–0.9)
Neutrophils Absolute: 4 10*3/uL (ref 1.4–7.0)
Neutrophils: 55 %
PLATELETS: 364 10*3/uL (ref 150–379)
RBC: 4.87 x10E6/uL (ref 3.77–5.28)
RDW: 14.3 % (ref 12.3–15.4)
WBC: 7.4 10*3/uL (ref 3.4–10.8)

## 2016-10-10 ENCOUNTER — Telehealth: Payer: Self-pay | Admitting: Family Medicine

## 2016-10-10 DIAGNOSIS — N3090 Cystitis, unspecified without hematuria: Secondary | ICD-10-CM | POA: Diagnosis not present

## 2016-10-10 DIAGNOSIS — R300 Dysuria: Secondary | ICD-10-CM | POA: Diagnosis not present

## 2016-10-10 NOTE — Telephone Encounter (Signed)
We need her to come in to at least give [place] a urine so we can make sure she's not resistant to the medicine that we give her. If she can't handle an appointment, I'll put an order in for just a UA, but she needs to have a UA. Thanks.

## 2016-10-10 NOTE — Telephone Encounter (Signed)
Routing to provider  

## 2016-10-10 NOTE — Telephone Encounter (Signed)
Spoke with patient she is going to go to [place] walk in clinic.

## 2016-10-10 NOTE — Telephone Encounter (Signed)
Patient was treated a month or so ago by Apolonio Schneiders for a UTI with a 3-day round of antibiotic per patient.  She said she does not feel this was enough to completely get rid of the infection.  She is requesting another round of med be sent in as her symptoms has returned and she does not even feel up to coming in for an appt.  Phar is [facility].  She can be reached @ [phone]  Thank You

## 2016-10-14 DIAGNOSIS — M533 Sacrococcygeal disorders, not elsewhere classified: Secondary | ICD-10-CM | POA: Diagnosis not present

## 2016-10-14 DIAGNOSIS — M791 Myalgia: Secondary | ICD-10-CM | POA: Diagnosis not present

## 2016-10-14 DIAGNOSIS — M5416 Radiculopathy, lumbar region: Secondary | ICD-10-CM | POA: Diagnosis not present

## 2016-10-14 DIAGNOSIS — M47817 Spondylosis without myelopathy or radiculopathy, lumbosacral region: Secondary | ICD-10-CM | POA: Diagnosis not present

## 2016-11-11 ENCOUNTER — Telehealth: Payer: Self-pay | Admitting: Family Medicine

## 2016-11-11 DIAGNOSIS — Z79891 Long term (current) use of opiate analgesic: Secondary | ICD-10-CM | POA: Diagnosis not present

## 2016-11-11 DIAGNOSIS — G894 Chronic pain syndrome: Secondary | ICD-10-CM | POA: Diagnosis not present

## 2016-11-11 DIAGNOSIS — M5416 Radiculopathy, lumbar region: Secondary | ICD-10-CM | POA: Diagnosis not present

## 2016-11-11 DIAGNOSIS — M47817 Spondylosis without myelopathy or radiculopathy, lumbosacral region: Secondary | ICD-10-CM | POA: Diagnosis not present

## 2016-11-11 NOTE — Telephone Encounter (Signed)
Patient would like Amy Rocha to call her in something for her UTI such as augmentin or something similar.  Her husband just had hip replacement and she cannot leave him unless she has someone to sit with him. She said it is same thing she has had twice recently.  Thanks  [facility]

## 2016-11-11 NOTE — Telephone Encounter (Signed)
Routing to provider, please advise.

## 2016-11-12 ENCOUNTER — Encounter (INDEPENDENT_AMBULATORY_CARE_PROVIDER_SITE_OTHER): Payer: Self-pay

## 2016-11-12 ENCOUNTER — Ambulatory Visit (INDEPENDENT_AMBULATORY_CARE_PROVIDER_SITE_OTHER): Payer: Self-pay | Admitting: Vascular Surgery

## 2016-11-12 MED ORDER — SULFAMETHOXAZOLE-TRIMETHOPRIM 800-160 MG PO TABS: 1.0000 | ORAL_TABLET | Freq: Two times a day (BID) | ORAL | 0 refills | Status: DC

## 2016-11-12 NOTE — Telephone Encounter (Signed)
Patient would like something stronger than previously because the last time she ended up going to a walk in clinic because you were away and she could not be seen here.  She is asking that I call her once this is called in so she will know when she needs to pick up the med.   Thank you

## 2016-11-12 NOTE — Telephone Encounter (Signed)
Sent over a good broad spectrum antibiotic for her to start on, if she's not improving we will need to see her for a specimen

## 2016-11-12 NOTE — Telephone Encounter (Signed)
Called pt to let her know script was called in for UTI

## 2016-11-12 NOTE — Telephone Encounter (Signed)
routing to provider

## 2016-11-25 ENCOUNTER — Other Ambulatory Visit: Payer: Self-pay | Admitting: Family Medicine

## 2016-11-27 ENCOUNTER — Emergency Department: Payer: Medicare HMO

## 2016-11-27 ENCOUNTER — Encounter: Payer: Self-pay | Admitting: Emergency Medicine

## 2016-11-27 ENCOUNTER — Emergency Department
Admission: EM | Admit: 2016-11-27 | Discharge: 2016-11-27 | Disposition: A | Discharge status: Home / Self Care | Payer: Medicare HMO | Attending: Emergency Medicine | Admitting: Emergency Medicine

## 2016-11-27 DIAGNOSIS — N184 Chronic kidney disease, stage 4 (severe): Secondary | ICD-10-CM | POA: Insufficient documentation

## 2016-11-27 DIAGNOSIS — G8929 Other chronic pain: Secondary | ICD-10-CM | POA: Diagnosis not present

## 2016-11-27 DIAGNOSIS — Z96649 Presence of unspecified artificial hip joint: Secondary | ICD-10-CM | POA: Diagnosis not present

## 2016-11-27 DIAGNOSIS — L259 Unspecified contact dermatitis, unspecified cause: Secondary | ICD-10-CM | POA: Insufficient documentation

## 2016-11-27 DIAGNOSIS — Z79899 Other long term (current) drug therapy: Secondary | ICD-10-CM | POA: Insufficient documentation

## 2016-11-27 DIAGNOSIS — I509 Heart failure, unspecified: Secondary | ICD-10-CM | POA: Insufficient documentation

## 2016-11-27 DIAGNOSIS — Z79891 Long term (current) use of opiate analgesic: Secondary | ICD-10-CM | POA: Insufficient documentation

## 2016-11-27 DIAGNOSIS — I130 Hypertensive heart and chronic kidney disease with heart failure and stage 1 through stage 4 chronic kidney disease, or unspecified chronic kidney disease: Secondary | ICD-10-CM | POA: Diagnosis not present

## 2016-11-27 DIAGNOSIS — J45909 Unspecified asthma, uncomplicated: Secondary | ICD-10-CM | POA: Diagnosis not present

## 2016-11-27 DIAGNOSIS — M7062 Trochanteric bursitis, left hip: Secondary | ICD-10-CM | POA: Diagnosis not present

## 2016-11-27 DIAGNOSIS — M79605 Pain in left leg: Secondary | ICD-10-CM | POA: Diagnosis not present

## 2016-11-27 DIAGNOSIS — M25552 Pain in left hip: Secondary | ICD-10-CM | POA: Diagnosis not present

## 2016-11-27 DIAGNOSIS — J449 Chronic obstructive pulmonary disease, unspecified: Secondary | ICD-10-CM | POA: Insufficient documentation

## 2016-11-27 DIAGNOSIS — M545 Low back pain: Secondary | ICD-10-CM | POA: Diagnosis not present

## 2016-11-27 DIAGNOSIS — Z87891 Personal history of nicotine dependence: Secondary | ICD-10-CM | POA: Insufficient documentation

## 2016-11-27 MED ORDER — PREDNISONE 10 MG (21) PO TBPK: ORAL_TABLET | ORAL | 0 refills | Status: DC

## 2016-11-27 MED ORDER — DEXAMETHASONE SODIUM PHOSPHATE 10 MG/ML IJ SOLN
10.0000 mg | Freq: Once | INTRAMUSCULAR | Status: AC
  Administered 2016-11-27: 10 mg via INTRAVENOUS
  Filled 2016-11-27: qty 1

## 2016-11-27 MED ORDER — MORPHINE SULFATE (PF) 4 MG/ML IV SOLN
4.0000 mg | Freq: Once | INTRAVENOUS | Status: AC
  Administered 2016-11-27: 4 mg via INTRAVENOUS
  Filled 2016-11-27: qty 1

## 2016-11-27 MED ORDER — DIPHENHYDRAMINE HCL 50 MG/ML IJ SOLN
25.0000 mg | Freq: Once | INTRAMUSCULAR | Status: AC
  Administered 2016-11-27: 25 mg via INTRAVENOUS
  Filled 2016-11-27: qty 1

## 2016-11-27 NOTE — ED Notes (Signed)
Has appointment with Ortho today, pain progressively worsening, hx of surgery to same x4

## 2016-11-27 NOTE — Discharge Instructions (Signed)
Once discharge Rachelle Hora will be able to see you at the [place] clinic today.

## 2016-11-27 NOTE — ED Provider Notes (Signed)
[facility] Emergency Department Provider Note   ____________________________________________   I have reviewed the triage vital signs and the nursing notes.   HISTORY  Chief Complaint Hip Pain; Leg Pain; and Rash    HPI Amy Rocha is a 61 y.o. female presents to the emergency department with increased left hip and thigh pain over the last several days. Patient had left total hip replacement in 1998 at [facility] with a revision in 2000. Patient had a scheduled appointment with Rachelle Hora at [facility] however, when pain became severe she canceled the appointment and decided come to the emergency d evaluation. Patient reports increase in pain with active movement of the left lower extremity and weightbearing activities. Patient denies any recent trauma or falls associated with current symptoms. She denies any sensation changes to the right lower extremity. Patient reports history of chronic back pain and she is currently followed by pain management and is prescribed narcotic pain regimen for her chronic back pain. Vision also reports rash along the left anterior thigh that developed yesterday. Patient denies any known trigger or allergy associated. She reports mild itching associated. The rash extends from the lower left abdomen along the left mid anterior thigh. Patient denies new soaps, detergents, perfumes, medications or other possible new triggers. Patient denies fever, chills, headache, vision changes, chest pain, chest tightness, shortness of breath, abdominal pain, nausea and vomiting.  Past Medical History:  Diagnosis Date  . Anxiety   . Arthritis   . Asthma   . Avascular necrosis ([place])   . Blood transfusion without reported diagnosis   . Calculus of kidney   . CHF (congestive heart failure) ([place])   . Chronic pain syndrome   . COPD (chronic obstructive pulmonary disease) ([place])   . Depression   . GERD (gastroesophageal reflux disease)   . Heart  failure ([place])   . Hyperlipidemia   . Hypertension   . IBS (irritable bowel syndrome)   . Joint pain   . Osteoporosis   . Oxygen deficiency   . Peripheral vascular disease ([place])   . Urge incontinence     Patient Active Problem List   Diagnosis Date Noted  . Hypertension 09/25/2016  . Oral-nasal fistula 02/06/2016  . Atherosclerotic peripheral vascular disease ([place]) 09/21/2015  . Insomnia 09/06/2015  . Complex regional pain syndrome of lower extremity 07/11/2015  . Bilateral occipital neuralgia 05/10/2015  . DDD (degenerative disc disease), lumbar 10/26/2014  . Facet syndrome, lumbar 10/26/2014  . History of total hip replacement 10/26/2014  . Avascular necrosis of bones of both hips ([place]) 10/26/2014  . Sacroiliac joint dysfunction 10/26/2014  . Calculus of kidney 09/08/2014  . Acute anxiety 09/08/2014  . GERD (gastroesophageal reflux disease) 09/08/2014  . Joint pain of leg 09/08/2014  . Urge incontinence 09/08/2014  . Hypertensive heart failure ([place]) 09/08/2014  . Avascular necrosis ([place]) 09/08/2014  . Anemia 09/08/2014  . Atherosclerosis of coronary artery 09/08/2014  . IBS (irritable bowel syndrome) 09/08/2014  . Asthma 09/08/2014  . CHF (congestive heart failure) ([place]) 09/08/2014  . Hyperlipidemia 09/08/2014  . COPD (chronic obstructive pulmonary disease) ([place]) 09/08/2014  . Depression 09/08/2014  . Hypertensive CKD (chronic kidney disease) 09/08/2014  . CKD (chronic kidney disease), stage IV ([place]) 09/08/2014  . Proteinuria 09/08/2014  . Chronic pain 09/08/2014    Past Surgical History:  Procedure Laterality Date  . ABDOMINAL HYSTERECTOMY    . avascular necrosis    . CARDIAC CATHETERIZATION Left 12/01/2015   Procedure: Left  Heart Cath and Coronary Angiography;  Surgeon: Teodoro Spray, MD;  Location: [facility];  Service: Cardiovascular;  Laterality: Left;  . eyelid lift    . HERNIA REPAIR    . JOINT REPLACEMENT     hip  . NASAL SINUS SURGERY       Prior to Admission medications   Medication Sig Start Date End Date Taking? Authorizing Provider  albuterol (PROVENTIL HFA;VENTOLIN HFA) 108 (90 Base) MCG/ACT inhaler Inhale 2 puffs into the lungs every 6 (six) hours as needed for wheezing or shortness of breath. 03/15/16   Volney American, PA-C  amLODipine (NORVASC) 5 MG tablet Take 1 tablet (5 mg total) by mouth daily. 05/08/16   Volney American, PA-C  atorvastatin (LIPITOR) 40 MG tablet TAKE 1 TABLET (40 MG TOTAL) BY MOUTH DAILY. 05/28/16   Volney American, PA-C  budesonide-formoterol [facility]) 160-4.5 MCG/ACT inhaler Inhale 2 puffs into the lungs 2 (two) times daily. 03/15/16   Volney American, PA-C  buPROPion (WELLBUTRIN XL) 300 MG 24 hr tablet TAKE 1 TABLET BY MOUTH ONCE DAILY. 01/26/16   Kathrine Haddock, NP  cromolyn (OPTICROM) 4 % ophthalmic solution Place 1 drop into both eyes 4 (four) times daily. 02/06/16   Kathrine Haddock, NP  dicyclomine (BENTYL) 20 MG tablet Take 1 tablet (20 mg total) by mouth every 6 (six) hours. 08/27/16   Volney American, PA-C  fluticasone [facility]) 50 MCG/ACT nasal spray Place 2 sprays into both nostrils daily. 08/27/16   Volney American, PA-C  loratadine (CLARITIN) 10 MG tablet Take 10 mg by mouth daily as needed. 03/05/16   [provider]  Multiple Vitamins-Calcium (ONE-A-DAY WOMENS FORMULA PO) Take by mouth daily. 05/04/16   [provider]  omeprazole (PRILOSEC) 20 MG capsule TAKE 1 CAPSULE BY MOUTH DAILY. 01/08/16   Kathrine Haddock, NP  ondansetron (ZOFRAN-ODT) 4 MG disintegrating tablet Take 4 mg by mouth as needed. 09/07/16   [provider]  oxyCODONE (OXYCONTIN) 20 mg 12 hr tablet Take by mouth. Pt states she takes 8 tablets daily    [provider]  predniSONE (STERAPRED UNI-PAK 21 TAB) 10 MG (21) TBPK tablet Take 6 tablets on day 1. Take 5 tablets on day 2. Take 4 tablets on day 3. Take 3 tablets on day 4. Take 2 tablets on day 5.  Take 1 tablets on day 6. 11/27/16   Aspyn Warnke M, PA-C  sulfamethoxazole-trimethoprim (BACTRIM DS,SEPTRA DS) 800-160 MG tablet Take 1 tablet by mouth 2 (two) times daily. 11/12/16   Volney American, PA-C  tiZANidine (ZANAFLEX) 2 MG tablet Take 2 mg by mouth as needed. 09/17/16   [provider]  TRINTELLIX 5 MG TABS TAKE 1 TABLET BY MOUTH DAILY. 11/25/16   Volney American, PA-C  valACYclovir (VALTREX) 1000 MG tablet Take 2 tablets (2,000 mg total) by mouth 2 (two) times daily. 02/06/16   Kathrine Haddock, NP    Allergies Lactose intolerance (gi) and Tetracyclines & related  Family History  Problem Relation Age of Onset  . Dementia Mother   . Cancer Father        colon  . Arthritis Brother   . Hypertension Daughter   . Aneurysm Brother     Social History Social History  Substance Use Topics  . Smoking status: Former Smoker    Quit date: 02/19/1996  . Smokeless tobacco: Never Used  . Alcohol use No    Review of Systems Constitutional: Negative for fever/chills Cardiovascular: Denies  chest pain. Respiratory: Denies cough. Denies shortness of breath. Gastrointestinal: No abdominal pain.  No nausea, vomiting, diarrhea. Musculoskeletal: Positive for right hip and thigh pain. Skin: Positive for rash along the left lower abdomen extending to the left anterior thigh. Neurological: Negative for headaches.  ____________________________________________   PHYSICAL EXAM:  VITAL SIGNS: ED Triage Vitals  Enc Vitals Group     BP 11/27/16 1014 124/79     Pulse Rate 11/27/16 1014 92     Resp 11/27/16 1014 18     Temp 11/27/16 1014 98 F (36.7 C)     Temp Source 11/27/16 1014 Oral     SpO2 11/27/16 1014 97 %     Weight 11/27/16 1014 160 lb (72.6 kg)     Height 11/27/16 1014 5\' 4"  (1.626 m)     Head Circumference --      Peak Flow --      Pain Score 11/27/16 1017 10     Pain Loc --      Pain Edu? --      Excl. in [place]? --     Constitutional: Alert and  oriented. Well appearing and in no acute distress.  Head: Normocephalic and atraumatic. Respiratory: Normal respiratory effort without tachypnea or retractions. Cardiovascular: Normal rate, regular rhythm. Normal distal pulses. Gastrointestinal: Bowel sounds 4 quadrants. Soft and nontender to palpation.  Musculoskeletal: left hip range of motion functionally intact all planes although limited by subjective pain. No lengthening, shortening or rotation noted of the left lower extremity. Intact sensation of the left lower extremity. Neurologic: Normal speech and language.  Skin:  Skin is warm, dry and intact. Erythematous rash without urticaria. Numerous small red papules, not fluid filled present. Small area of skin irritation with excoriation. Rash consistent with contact dermatitis. Psychiatric: Mood and affect are normal. Speech and behavior are normal. Patient exhibits appropriate insight and judgement.  ____________________________________________   LABS (all labs ordered are listed, but only abnormal results are displayed)  Labs Reviewed - No data to display ____________________________________________  EKG None ____________________________________________  RADIOLOGY DG femur left FINDINGS: No acute bony or joint abnormality is seen. Left hip arthroplasty is in place. The patient has a healed periprosthetic fracture about the distal aspect of the femoral stem with cerclage wires in place. Single screw in the distal femur is noted. Surgical clips about the greater trochanter are seen. Postoperative change of resection of a portion of the proximal diaphysis of the fibula noted.  IMPRESSION: No acute abnormality or finding to explain the patient's symptoms with postoperative changes noted as described above.  DG hip unilateral with pelvis FINDINGS: Status post bilateral total hip arthroplasties. No fracture or dislocation is noted. Sacroiliac joints are  unremarkable.  IMPRESSION: Status post bilateral total hip arthroplasties. No acute abnormality seen in the pelvis. ____________________________________________   PROCEDURES  Procedure(s) performed: no    Critical Care performed: no ____________________________________________   INITIAL IMPRESSION / ASSESSMENT AND PLAN / ED COURSE  Pertinent labs & imaging results that were available during my care of the patient were reviewed by me and considered in my medical decision making (see chart for details).  Patient presents to emergency department with left hip and midthigh pain without traumatic injury and rash along the left lower abdomen to the left anterior thigh. History, physical exam findings and imaging are reassuring symptoms are consistent with likely exacerbation of arthritic arthritic changes of the left hip and pelvis and/or lumbar spine. In addition rash is likely as result of contact  dermatitis of an unknown trigger. Patient noted decreased symptoms of the rash following Benadryl and Decadron given during the course of care in the emergency department. Patient will be prescribed prednisone taper and instructed to take over-the-counter Benadryl as needed for rash symptoms. Plan discharge patient will follow-up with Rachelle Hora at the [facility] otherwise. advised to follow up with PCP as needed or return to the emergency department if symptoms return or worsen.    ____________________________________________   FINAL CLINICAL IMPRESSION(S) / ED DIAGNOSES  Final diagnoses:  Leg pain, anterior, left  Left hip pain  Contact dermatitis, unspecified contact dermatitis type, unspecified trigger       NEW MEDICATIONS STARTED DURING THIS VISIT:  Discharge Medication List as of 11/27/2016  2:28 PM    START taking these medications   Details  predniSONE (STERAPRED UNI-PAK 21 TAB) 10 MG (21) TBPK tablet Take 6 tablets on day 1. Take 5 tablets on day 2. Take 4 tablets  on day 3. Take 3 tablets on day 4. Take 2 tablets on day 5. Take 1 tablets on day 6., Print         Note:  This document was prepared using Dragon voice recognition software and may include unintentional dictation errors.    Khamani Fairley, [place], PA-C 11/27/16 1834    Earleen Newport, MD 12/01/16 [phone]

## 2016-11-27 NOTE — ED Triage Notes (Addendum)
Pt arrived via POV from home, with reports of pain in the left hip and mid thigh.  Pt has hx of hip replacement in 1998 at [facility]. Pt states she thinks something is wrong with her hip or her femur bone is broke.  Pt is able to bear some weight but c/o pain. Pt also states she has a rash on the left side that she noticed this morning. Pt had appointment with Rachelle Hora today at 230pm but cancelled it because of the pain.

## 2016-12-02 ENCOUNTER — Other Ambulatory Visit: Payer: Self-pay | Admitting: Orthopedic Surgery

## 2016-12-02 DIAGNOSIS — M5416 Radiculopathy, lumbar region: Secondary | ICD-10-CM

## 2016-12-03 ENCOUNTER — Ambulatory Visit
Admission: RE | Admit: 2016-12-03 | Discharge: 2016-12-03 | Disposition: A | Discharge status: Home / Self Care | Payer: Medicare HMO | Source: Ambulatory Visit | Attending: Orthopedic Surgery | Admitting: Orthopedic Surgery

## 2016-12-03 DIAGNOSIS — M419 Scoliosis, unspecified: Secondary | ICD-10-CM | POA: Diagnosis not present

## 2016-12-03 DIAGNOSIS — M545 Low back pain: Secondary | ICD-10-CM | POA: Diagnosis not present

## 2016-12-03 DIAGNOSIS — M8938 Hypertrophy of bone, other site: Secondary | ICD-10-CM | POA: Diagnosis not present

## 2016-12-03 DIAGNOSIS — M5416 Radiculopathy, lumbar region: Secondary | ICD-10-CM | POA: Diagnosis not present

## 2016-12-05 ENCOUNTER — Other Ambulatory Visit: Payer: Self-pay | Admitting: Orthopedic Surgery

## 2016-12-05 DIAGNOSIS — M25552 Pain in left hip: Secondary | ICD-10-CM

## 2016-12-06 ENCOUNTER — Other Ambulatory Visit: Payer: Self-pay | Admitting: Orthopedic Surgery

## 2016-12-06 DIAGNOSIS — M25552 Pain in left hip: Secondary | ICD-10-CM

## 2016-12-09 DIAGNOSIS — M545 Low back pain: Secondary | ICD-10-CM | POA: Diagnosis not present

## 2016-12-09 DIAGNOSIS — Z5181 Encounter for therapeutic drug level monitoring: Secondary | ICD-10-CM | POA: Diagnosis not present

## 2016-12-09 DIAGNOSIS — M5136 Other intervertebral disc degeneration, lumbar region: Secondary | ICD-10-CM | POA: Diagnosis not present

## 2016-12-09 DIAGNOSIS — M259 Joint disorder, unspecified: Secondary | ICD-10-CM | POA: Diagnosis not present

## 2016-12-11 ENCOUNTER — Encounter
Admission: RE | Admit: 2016-12-11 | Discharge: 2016-12-11 | Disposition: A | Discharge status: Home / Self Care | Payer: Medicare HMO | Source: Ambulatory Visit | Attending: Orthopedic Surgery | Admitting: Orthopedic Surgery

## 2016-12-11 ENCOUNTER — Ambulatory Visit
Admission: RE | Admit: 2016-12-11 | Discharge: 2016-12-11 | Disposition: A | Discharge status: Home / Self Care | Payer: Medicare HMO | Source: Ambulatory Visit | Attending: Orthopedic Surgery | Admitting: Orthopedic Surgery

## 2016-12-11 DIAGNOSIS — Z96642 Presence of left artificial hip joint: Secondary | ICD-10-CM | POA: Insufficient documentation

## 2016-12-11 DIAGNOSIS — M25552 Pain in left hip: Secondary | ICD-10-CM | POA: Diagnosis not present

## 2016-12-11 MED ORDER — TECHNETIUM TC 99M MEDRONATE IV KIT
25.0000 | PACK | Freq: Once | INTRAVENOUS | Status: AC | PRN
  Administered 2016-12-11: 22.69 via INTRAVENOUS

## 2016-12-16 DIAGNOSIS — Z96649 Presence of unspecified artificial hip joint: Secondary | ICD-10-CM | POA: Diagnosis not present

## 2016-12-16 DIAGNOSIS — M2550 Pain in unspecified joint: Secondary | ICD-10-CM | POA: Diagnosis not present

## 2017-01-03 ENCOUNTER — Other Ambulatory Visit: Payer: Self-pay

## 2017-01-03 ENCOUNTER — Encounter
Admission: RE | Admit: 2017-01-03 | Discharge: 2017-01-03 | Disposition: A | Discharge status: Home / Self Care | Payer: Medicare HMO | Source: Ambulatory Visit | Attending: Orthopedic Surgery | Admitting: Orthopedic Surgery

## 2017-01-03 DIAGNOSIS — E785 Hyperlipidemia, unspecified: Secondary | ICD-10-CM | POA: Diagnosis not present

## 2017-01-03 DIAGNOSIS — Z0183 Encounter for blood typing: Secondary | ICD-10-CM | POA: Insufficient documentation

## 2017-01-03 DIAGNOSIS — I10 Essential (primary) hypertension: Secondary | ICD-10-CM | POA: Diagnosis not present

## 2017-01-03 DIAGNOSIS — Z01818 Encounter for other preprocedural examination: Secondary | ICD-10-CM | POA: Insufficient documentation

## 2017-01-03 DIAGNOSIS — Z01812 Encounter for preprocedural laboratory examination: Secondary | ICD-10-CM | POA: Diagnosis not present

## 2017-01-03 HISTORY — DX: Other specified postprocedural states: Z98.890

## 2017-01-03 HISTORY — DX: Nausea with vomiting, unspecified: R11.2

## 2017-01-03 HISTORY — DX: Adverse effect of unspecified anesthetic, initial encounter: T41.45XA

## 2017-01-03 HISTORY — DX: Other complications of anesthesia, initial encounter: T88.59XA

## 2017-01-03 LAB — BASIC METABOLIC PANEL
Anion gap: 10 (ref 5–15)
BUN: 16 mg/dL (ref 6–20)
CHLORIDE: 105 mmol/L (ref 101–111)
CO2: 24 mmol/L (ref 22–32)
CREATININE: 1.08 mg/dL — AB (ref 0.44–1.00)
Calcium: 9.3 mg/dL (ref 8.9–10.3)
GFR calc Af Amer: >60 mL/min (ref >60)
GFR calc non Af Amer: 53 mL/min — ABNORMAL LOW (ref >60)
Glucose, Bld: 97 mg/dL (ref 65–99)
Potassium: 4.3 mmol/L (ref 3.5–5.1)
SODIUM: 139 mmol/L (ref 135–145)

## 2017-01-03 LAB — URINALYSIS, ROUTINE W REFLEX MICROSCOPIC
BACTERIA UA: NONE SEEN
BILIRUBIN URINE: NEGATIVE
Glucose, UA: NEGATIVE mg/dL
HGB URINE DIPSTICK: NEGATIVE
Ketones, ur: NEGATIVE mg/dL
NITRITE: NEGATIVE
PROTEIN: NEGATIVE mg/dL
Specific Gravity, Urine: 1.005 (ref 1.005–1.030)
pH: 7 (ref 5.0–8.0)

## 2017-01-03 LAB — CBC
HEMATOCRIT: 45.8 % (ref 35.0–47.0)
HEMOGLOBIN: 15.3 g/dL (ref 12.0–16.0)
MCH: 30.6 pg (ref 26.0–34.0)
MCHC: 33.5 g/dL (ref 32.0–36.0)
MCV: 91.5 fL (ref 80.0–100.0)
PLATELETS: 343 10*3/uL (ref 150–440)
RBC: 5 MIL/uL (ref 3.80–5.20)
RDW: 14.4 % (ref 11.5–14.5)
WBC: 9.5 10*3/uL (ref 3.6–11.0)

## 2017-01-03 LAB — SURGICAL PCR SCREEN
MRSA, PCR: NEGATIVE
Staphylococcus aureus: POSITIVE — AB

## 2017-01-03 LAB — APTT: aPTT: 34 seconds (ref 24–36)

## 2017-01-03 LAB — PROTIME-INR
INR: 0.89
PROTHROMBIN TIME: 12 s (ref 11.4–15.2)

## 2017-01-03 LAB — SEDIMENTATION RATE: SED RATE: 14 mm/h (ref 0–30)

## 2017-01-03 NOTE — Patient Instructions (Signed)
Your procedure is scheduled on: Tuesday 01/14/17 Report to [facility]. 2ND FLOOR MEDICAL MALL ENTRANCE. To find out your arrival time please call [phone] between 1PM - 3PM on Monday 01/13/17.  Remember: Instructions that are not followed completely may result in serious medical risk, up to and including death, or upon the discretion of your surgeon and anesthesiologist your surgery may need to be rescheduled.    __X__ 1. Do not eat anything after midnight the night before your    procedure.  No gum chewing or hard candies.  You may drink clear   liquids up to 2 hours before you are scheduled to arrive at the   hospital for your procedure. Do not drink clear liquids within 2   hours of scheduled arrival to the hospital as this may lead to your   procedure being delayed or rescheduled.       Clear liquids include:   Water or Apple juice without pulp   Clear carbohydrate beverage such as Clearfast or Gatorade   Black coffee or Clear Tea (no milk, no creamer, do not add anything   to the coffee or tea)    Diabetics should only drink water   __X__ 2. No Alcohol for 24 hours before or after surgery.   ____ 3. Bring all medications with you on the day of surgery if instructed.    __X__ 4. Notify your doctor if there is any change in your medical condition     (cold, fever, infections).             __X___5. No smoking within 24 hours of your surgery.     Do not wear jewelry, make-up, hairpins, clips or nail polish.  Do not wear lotions, powders, or perfumes.   Do not shave 48 hours prior to surgery. Men may shave face and neck.  Do not bring valuables to the hospital.    [facility] is not responsible for any belongings or valuables.               Contacts, dentures or bridgework may not be worn into surgery.  Leave your suitcase in the car. After surgery it may be brought to your room.  For patients admitted to the hospital, discharge time is determined by your                 treatment team.   Patients discharged the day of surgery will not be allowed to drive home.   Please read over the following fact sheets that you were given:   MRSA Information   __X__ Take these medicines the morning of surgery with A SIP OF WATER:    1. OXYCODONE  2. OMEPRAZOLE  3.   4.  5.  6.  ____ Fleet Enema (as directed)   __X__ Use CHG Soap/SAGE wipes as directed  __X__ Use inhalers on the day of surgery  ____ Stop metformin 2 days prior to surgery    ____ Take 1/2 of usual insulin dose the night before surgery and none on the morning of surgery.   ____ Stop Coumadin/Plavix/aspirin on   __X__ Stop Anti-inflammatories such as Advil, Aleve, Ibuprofen, Motrin, Naproxen, Naprosyn, Goodies,powder, or aspirin products.  OK to take Tylenol.   __X__ Stop supplements, Vitamin E, Fish Oil until after surgery.    ____ Bring C-Pap to the hospital.

## 2017-01-04 LAB — URINE CULTURE

## 2017-01-06 DIAGNOSIS — M259 Joint disorder, unspecified: Secondary | ICD-10-CM | POA: Diagnosis not present

## 2017-01-06 DIAGNOSIS — M5136 Other intervertebral disc degeneration, lumbar region: Secondary | ICD-10-CM | POA: Diagnosis not present

## 2017-01-06 DIAGNOSIS — Z5181 Encounter for therapeutic drug level monitoring: Secondary | ICD-10-CM | POA: Diagnosis not present

## 2017-01-06 DIAGNOSIS — M545 Low back pain: Secondary | ICD-10-CM | POA: Diagnosis not present

## 2017-01-06 NOTE — Pre-Procedure Instructions (Signed)
UC FAXED TO DR [facility]

## 2017-01-11 ENCOUNTER — Other Ambulatory Visit: Payer: Self-pay | Admitting: Unknown Physician Specialty

## 2017-01-13 NOTE — Telephone Encounter (Signed)
Your patient.  Thanks 

## 2017-01-14 ENCOUNTER — Encounter
Admission: RE | Disposition: A | Discharge status: Skilled Nursing Facility | Payer: Self-pay | Source: Ambulatory Visit | Attending: Orthopedic Surgery

## 2017-01-14 LAB — BPAM RBC
BLOOD PRODUCT EXPIRATION DATE: 201812122359
BLOOD PRODUCT EXPIRATION DATE: 201812122359
UNIT TYPE AND RH: 5100
Unit Type and Rh: 5100

## 2017-01-14 LAB — TYPE AND SCREEN
ABO/RH(D): A POS
Antibody Screen: NEGATIVE
UNIT DIVISION: 0
Unit division: 0

## 2017-01-14 SURGERY — TOTAL HIP REVISION
Anesthesia: Choice | Laterality: Left

## 2017-01-14 MED ORDER — CEFAZOLIN SODIUM-DEXTROSE 2-4 GM/100ML-% IV SOLN
2.0000 g | Freq: Once | INTRAVENOUS | Status: AC
  Administered 2017-01-21: 2 g via INTRAVENOUS

## 2017-01-14 NOTE — H&P (Signed)
Progress Notes - in this encounter  Table of Contents for Progress Notes  Lauris Poag, MD - 12/16/2016 3:15 PM EDT  Dalene Carrow, [place] - 12/16/2016 3:15 PM EDT    Lauris Poag, MD - 12/16/2016 3:15 PM EDT Formatting of this note might be different from the original.  Chief Complaint  Patient presents with  . Follow-up  L Hip Pain.   History of the Present Illness: Amy Rocha is a 61 y.o. female here for follow-up of left hip pain. The patient has history of a total of 4 surgeries on her left hip, which were all performed by Dr. Leontine Locket. The most recent surgery was performed in 2003. She explains that her first surgery was a free fibular graft for avascular necrosis, which failed. She later sustained a femur fracture, and had revision with allograft of the femur, and a total hip arthroplasty. The patient has been seen previously by Dr. Primus Bravo at the pain clinic.   The patient describes severe pain which is constant, and exacerbated while weight bearing. It is so painful she cannot place her left foot on the floor. She denies experiencing pain while lying flat on her bed. The patient denies having prior bloodwork to rule out infection.   The patient has history of right primary total hip arthroplasty in 2005. She also had a spinal cord stimulator placed in 2005, which resulted in a staph infection.   The patient is accompanied today by her husband. She denies having any current medical issues. She states that she was last seen by Dr. Ubaldo Glassing, her cardiologist, sometime this year. However she is unsure of the date.  I have reviewed past medical, surgical, social and family history, and allergies as documented in the EMR.  Past Medical History: Past Medical History:  Diagnosis Date  . Anxiety, unspecified  . Depression, unspecified  . Hypertension   Past Surgical History: Past Surgical History:  Procedure Laterality Date  . bilateral hip replacement  .  FUNCTIONAL ENDOSCOPIC SINUS SURGERY   Past Family History: Family History  Problem Relation Age of Onset  . No Known Problems Mother  . No Known Problems Father   Medications: Current Outpatient Medications Ordered in Epic  Medication Sig Dispense Refill  . amLODIPine (NORVASC) 5 MG tablet Take 1 tablet by mouth once daily.  Marland Kitchen atorvastatin (LIPITOR) 40 MG tablet Take 1 tablet by mouth once daily.  Marland Kitchen buPROPion (WELLBUTRIN XL) 300 MG XL tablet 1 tab by mouth daily  . cromolyn (CROLOM) 4 % ophthalmic solution Apply to eye.  . DULoxetine (CYMBALTA) 60 MG DR capsule 1 cap by mouth daily  . fluticasone (FLONASE) 50 mcg/actuation nasal spray Place 2 sprays into both nostrils once daily 6  . LORazepam (ATIVAN) 0.5 MG tablet 0.5 mg prn as needed  . omeprazole (PRILOSEC) 20 MG DR capsule Take 20 mg by mouth once daily  . oxyCODONE (OXYCONTIN) 20 MG CR tablet 1 tab by mouth every 12 hours  . valacyclovir (VALTREX) 500 MG tablet 500 mg prn as needed   No current Epic-ordered facility-administered medications on file.   Allergies: Allergies  Allergen Reactions  . Fluticasone-Salmeterol Other (See Comments) and Swelling  Tongue sores Tongue sores  . Lactose Unknown  Bloating, GI upset, diarrhea  . Tetracyclines Unknown    Body mass index is 27.29 kg/m.  Review of Systems:A comprehensive 14 point ROS was performed, reviewed, and the pertinent orthopaedic findings are documented in the HPI.  Vitals:  12/16/16 1438  BP: 128/84   General Physical Examination:  General/Constitutional: No apparent distress: well-nourished and well developed. Eyes: Pupils equal, round with synchronous movement. Lungs: Clear to auscultation HEENT: Normal Vascular: No edema, swelling or tenderness, except as noted in detailed exam. Cardiac: Heart rate and rhythm is regular. Integumentary: No impressive skin lesions present, except as noted in detailed exam. Neuro/Psych: Normal mood and affect, oriented  to person, place and time.  Musculoskeletal Examination: On examination of the left hip, the patient is slender, and I can palpate her bone. She has a well healed long posterior incision, approximately 40 cm in length.N/v intact, equal leg lengths.  Radiographs: MRI of the lumbar spine obtained on 12/02/2016 was reviewed by me today, which showed no evidence that would explain her current pain.   Bone scan performed on 12/11/2016 was reviewed by me today, which showed increased uptake in mid-stem.  Assessment: ICD-10-CM ICD-9-CM  1. Status post revision of left total hip x4 Z96.649 V43.64  2. Multiple joint pain, unspecified M25.50 719.49   The patient is experiencing left hip pain, 15 years status post left total hip arthroplasty.  Plan: There is increased uptake in the mid-stem of her left total hip arthroplasty, seen on her bone scan. I suspect the stem may be the source of her pain. I recommend we obtain labs, checking sedimentation rate and c-reactive protein levels to rule out infection. As long as her results are good, we will proceed with scheduling her for revision left total hip arthroplasty of her S-ROM to a shorter stem. We will attempt to obtain records of her previous operative note, and if we cannot obtain it, we will contact the implant manufacturer of her acetabular cup to see what kind it is, and do a possible polyethylene liner exchange as well.   Surgical Risks:  The nature of the condition and the proposed procedure has been reviewed in detail with the patient. Surgical versus non-surgical options and prognosis for recovery have been reviewed and the inherent risks and benefits of each have been discussed including the risks of infection, bleeding, injury to nerves / blood vessels / tendons, incomplete relief of symptoms, persisting pain and / or stiffness, loss of function, complex regional pain syndrome, failure of procedure, as appropriate.  Scribe Attestation: Veda Canning, am acting as scribe for Lauris Poag, MD

## 2017-01-17 DIAGNOSIS — M259 Joint disorder, unspecified: Secondary | ICD-10-CM | POA: Diagnosis not present

## 2017-01-17 DIAGNOSIS — M5136 Other intervertebral disc degeneration, lumbar region: Secondary | ICD-10-CM | POA: Diagnosis not present

## 2017-01-17 DIAGNOSIS — M545 Low back pain: Secondary | ICD-10-CM | POA: Diagnosis not present

## 2017-01-17 DIAGNOSIS — Z5181 Encounter for therapeutic drug level monitoring: Secondary | ICD-10-CM | POA: Diagnosis not present

## 2017-01-20 MED ORDER — FAMOTIDINE 20 MG PO TABS: 20.0000 mg | ORAL_TABLET | Freq: Once | ORAL | Status: DC

## 2017-01-21 ENCOUNTER — Inpatient Hospital Stay: Admission: RE | Admit: 2017-01-21 | Payer: Medicare HMO | Source: Ambulatory Visit | Admitting: Orthopedic Surgery

## 2017-01-21 ENCOUNTER — Other Ambulatory Visit: Payer: Self-pay

## 2017-01-21 ENCOUNTER — Inpatient Hospital Stay: Payer: Medicare HMO | Admitting: Certified Registered"

## 2017-01-21 ENCOUNTER — Inpatient Hospital Stay: Payer: Medicare HMO

## 2017-01-21 ENCOUNTER — Encounter: Payer: Self-pay | Admitting: *Deleted

## 2017-01-21 ENCOUNTER — Inpatient Hospital Stay
Admission: RE | Admit: 2017-01-21 | Discharge: 2017-01-24 | DRG: 467 | Disposition: A | Discharge status: Skilled Nursing Facility | Payer: Medicare HMO | Source: Ambulatory Visit | Attending: Orthopedic Surgery | Admitting: Orthopedic Surgery

## 2017-01-21 ENCOUNTER — Encounter
Admission: RE | Disposition: A | Discharge status: Skilled Nursing Facility | Payer: Self-pay | Source: Ambulatory Visit | Attending: Orthopedic Surgery

## 2017-01-21 DIAGNOSIS — T84091A Other mechanical complication of internal left hip prosthesis, initial encounter: Secondary | ICD-10-CM | POA: Diagnosis not present

## 2017-01-21 DIAGNOSIS — G894 Chronic pain syndrome: Secondary | ICD-10-CM | POA: Diagnosis present

## 2017-01-21 DIAGNOSIS — Z96642 Presence of left artificial hip joint: Secondary | ICD-10-CM | POA: Diagnosis not present

## 2017-01-21 DIAGNOSIS — E785 Hyperlipidemia, unspecified: Secondary | ICD-10-CM | POA: Diagnosis present

## 2017-01-21 DIAGNOSIS — Y792 Prosthetic and other implants, materials and accessory orthopedic devices associated with adverse incidents: Secondary | ICD-10-CM | POA: Diagnosis present

## 2017-01-21 DIAGNOSIS — F329 Major depressive disorder, single episode, unspecified: Secondary | ICD-10-CM | POA: Diagnosis not present

## 2017-01-21 DIAGNOSIS — G8918 Other acute postprocedural pain: Secondary | ICD-10-CM

## 2017-01-21 DIAGNOSIS — K219 Gastro-esophageal reflux disease without esophagitis: Secondary | ICD-10-CM | POA: Diagnosis not present

## 2017-01-21 DIAGNOSIS — T8489XA Other specified complication of internal orthopedic prosthetic devices, implants and grafts, initial encounter: Secondary | ICD-10-CM | POA: Diagnosis not present

## 2017-01-21 DIAGNOSIS — Z23 Encounter for immunization: Secondary | ICD-10-CM | POA: Diagnosis not present

## 2017-01-21 DIAGNOSIS — M810 Age-related osteoporosis without current pathological fracture: Secondary | ICD-10-CM | POA: Diagnosis present

## 2017-01-21 DIAGNOSIS — I130 Hypertensive heart and chronic kidney disease with heart failure and stage 1 through stage 4 chronic kidney disease, or unspecified chronic kidney disease: Secondary | ICD-10-CM | POA: Diagnosis not present

## 2017-01-21 DIAGNOSIS — I2510 Atherosclerotic heart disease of native coronary artery without angina pectoris: Secondary | ICD-10-CM | POA: Diagnosis not present

## 2017-01-21 DIAGNOSIS — M5481 Occipital neuralgia: Secondary | ICD-10-CM | POA: Diagnosis not present

## 2017-01-21 DIAGNOSIS — Z471 Aftercare following joint replacement surgery: Secondary | ICD-10-CM | POA: Diagnosis not present

## 2017-01-21 DIAGNOSIS — N183 Chronic kidney disease, stage 3 (moderate): Secondary | ICD-10-CM | POA: Diagnosis present

## 2017-01-21 DIAGNOSIS — T8484XD Pain due to internal orthopedic prosthetic devices, implants and grafts, subsequent encounter: Secondary | ICD-10-CM | POA: Diagnosis not present

## 2017-01-21 DIAGNOSIS — Z96643 Presence of artificial hip joint, bilateral: Secondary | ICD-10-CM | POA: Diagnosis not present

## 2017-01-21 DIAGNOSIS — I110 Hypertensive heart disease with heart failure: Secondary | ICD-10-CM | POA: Diagnosis not present

## 2017-01-21 DIAGNOSIS — I739 Peripheral vascular disease, unspecified: Secondary | ICD-10-CM | POA: Diagnosis present

## 2017-01-21 DIAGNOSIS — M62838 Other muscle spasm: Secondary | ICD-10-CM | POA: Diagnosis present

## 2017-01-21 DIAGNOSIS — Z87891 Personal history of nicotine dependence: Secondary | ICD-10-CM | POA: Diagnosis not present

## 2017-01-21 DIAGNOSIS — Z419 Encounter for procedure for purposes other than remedying health state, unspecified: Secondary | ICD-10-CM

## 2017-01-21 DIAGNOSIS — M2550 Pain in unspecified joint: Secondary | ICD-10-CM | POA: Diagnosis not present

## 2017-01-21 DIAGNOSIS — T84018A Broken internal joint prosthesis, other site, initial encounter: Secondary | ICD-10-CM

## 2017-01-21 DIAGNOSIS — I509 Heart failure, unspecified: Secondary | ICD-10-CM | POA: Diagnosis not present

## 2017-01-21 DIAGNOSIS — T8484XA Pain due to internal orthopedic prosthetic devices, implants and grafts, initial encounter: Secondary | ICD-10-CM | POA: Diagnosis not present

## 2017-01-21 DIAGNOSIS — K589 Irritable bowel syndrome without diarrhea: Secondary | ICD-10-CM | POA: Diagnosis not present

## 2017-01-21 DIAGNOSIS — J449 Chronic obstructive pulmonary disease, unspecified: Secondary | ICD-10-CM | POA: Diagnosis present

## 2017-01-21 DIAGNOSIS — Z7401 Bed confinement status: Secondary | ICD-10-CM | POA: Diagnosis not present

## 2017-01-21 DIAGNOSIS — M5136 Other intervertebral disc degeneration, lumbar region: Secondary | ICD-10-CM | POA: Diagnosis not present

## 2017-01-21 DIAGNOSIS — M25552 Pain in left hip: Secondary | ICD-10-CM | POA: Diagnosis not present

## 2017-01-21 DIAGNOSIS — F419 Anxiety disorder, unspecified: Secondary | ICD-10-CM | POA: Diagnosis not present

## 2017-01-21 DIAGNOSIS — M5386 Other specified dorsopathies, lumbar region: Secondary | ICD-10-CM | POA: Diagnosis not present

## 2017-01-21 DIAGNOSIS — Z96649 Presence of unspecified artificial hip joint: Secondary | ICD-10-CM

## 2017-01-21 DIAGNOSIS — G4700 Insomnia, unspecified: Secondary | ICD-10-CM | POA: Diagnosis not present

## 2017-01-21 DIAGNOSIS — M1990 Unspecified osteoarthritis, unspecified site: Secondary | ICD-10-CM | POA: Diagnosis not present

## 2017-01-21 DIAGNOSIS — N189 Chronic kidney disease, unspecified: Secondary | ICD-10-CM | POA: Diagnosis not present

## 2017-01-21 HISTORY — PX: TOTAL HIP REVISION: SHX763

## 2017-01-21 LAB — CREATININE, SERUM
CREATININE: 1.22 mg/dL — AB (ref 0.44–1.00)
GFR calc Af Amer: 53 mL/min — ABNORMAL LOW (ref >60)
GFR, EST NON AFRICAN AMERICAN: 46 mL/min — AB (ref >60)

## 2017-01-21 LAB — TYPE AND SCREEN
ABO/RH(D): A POS
ANTIBODY SCREEN: NEGATIVE

## 2017-01-21 LAB — CBC
HEMATOCRIT: 45.1 % (ref 35.0–47.0)
Hemoglobin: 14.9 g/dL (ref 12.0–16.0)
MCH: 30.5 pg (ref 26.0–34.0)
MCHC: 33 g/dL (ref 32.0–36.0)
MCV: 92.6 fL (ref 80.0–100.0)
PLATELETS: 290 10*3/uL (ref 150–440)
RBC: 4.87 MIL/uL (ref 3.80–5.20)
RDW: 14 % (ref 11.5–14.5)
WBC: 17.7 10*3/uL — ABNORMAL HIGH (ref 3.6–11.0)

## 2017-01-21 SURGERY — TOTAL HIP REVISION
Anesthesia: Spinal | Site: Hip | Laterality: Left | Wound class: Clean

## 2017-01-21 MED ORDER — PHENYLEPHRINE HCL 10 MG/ML IJ SOLN
INTRAMUSCULAR | Status: DC | PRN
  Administered 2017-01-21: 100 ug via INTRAVENOUS
  Administered 2017-01-21: 200 ug via INTRAVENOUS
  Administered 2017-01-21: 100 ug via INTRAVENOUS

## 2017-01-21 MED ORDER — CHLORHEXIDINE GLUCONATE 4 % EX LIQD: Freq: Once | CUTANEOUS | Status: DC

## 2017-01-21 MED ORDER — GLYCOPYRROLATE 0.2 MG/ML IJ SOLN
INTRAMUSCULAR | Status: AC
  Filled 2017-01-21: qty 1

## 2017-01-21 MED ORDER — METHOCARBAMOL 1000 MG/10ML IJ SOLN
500.0000 mg | Freq: Four times a day (QID) | INTRAVENOUS | Status: DC | PRN
  Filled 2017-01-21: qty 5

## 2017-01-21 MED ORDER — TETRACAINE HCL 1 % IJ SOLN
INTRAMUSCULAR | Status: DC | PRN
  Administered 2017-01-21: 5 mg via INTRASPINAL

## 2017-01-21 MED ORDER — PNEUMOCOCCAL VAC POLYVALENT 25 MCG/0.5ML IJ INJ
0.5000 mL | INJECTION | INTRAMUSCULAR | Status: AC
  Administered 2017-01-22: 0.5 mL via INTRAMUSCULAR
  Filled 2017-01-21: qty 0.5

## 2017-01-21 MED ORDER — SODIUM CHLORIDE 0.9 % IV SOLN
INTRAVENOUS | Status: DC
  Administered 2017-01-22 – 2017-01-23 (×2): via INTRAVENOUS

## 2017-01-21 MED ORDER — MENTHOL 3 MG MT LOZG
1.0000 | LOZENGE | OROMUCOSAL | Status: DC | PRN
  Filled 2017-01-21 (×2): qty 9

## 2017-01-21 MED ORDER — TETRACAINE HCL 1 % IJ SOLN
INTRAMUSCULAR | Status: AC
  Filled 2017-01-21: qty 2

## 2017-01-21 MED ORDER — OXYCODONE HCL ER 20 MG PO T12A
40.0000 mg | EXTENDED_RELEASE_TABLET | Freq: Two times a day (BID) | ORAL | Status: DC
  Administered 2017-01-21 – 2017-01-24 (×6): 40 mg via ORAL
  Filled 2017-01-21 (×6): qty 2

## 2017-01-21 MED ORDER — ONDANSETRON HCL 4 MG/2ML IJ SOLN: 4.0000 mg | Freq: Four times a day (QID) | INTRAMUSCULAR | Status: DC | PRN

## 2017-01-21 MED ORDER — PROPOFOL 500 MG/50ML IV EMUL
INTRAVENOUS | Status: AC
  Filled 2017-01-21: qty 50

## 2017-01-21 MED ORDER — MAGNESIUM CITRATE PO SOLN
1.0000 | Freq: Once | ORAL | Status: DC | PRN
  Filled 2017-01-21: qty 296

## 2017-01-21 MED ORDER — LACTATED RINGERS IV SOLN
INTRAVENOUS | Status: DC
  Administered 2017-01-21: 12:00:00 via INTRAVENOUS

## 2017-01-21 MED ORDER — PHENOL 1.4 % MT LIQD
1.0000 | OROMUCOSAL | Status: DC | PRN
  Filled 2017-01-21: qty 177

## 2017-01-21 MED ORDER — GENTAMICIN SULFATE 40 MG/ML IJ SOLN
INTRAMUSCULAR | Status: AC
  Filled 2017-01-21: qty 6

## 2017-01-21 MED ORDER — METHOCARBAMOL 500 MG PO TABS
500.0000 mg | ORAL_TABLET | Freq: Four times a day (QID) | ORAL | Status: DC | PRN
  Administered 2017-01-22 – 2017-01-23 (×3): 500 mg via ORAL
  Filled 2017-01-21 (×3): qty 1

## 2017-01-21 MED ORDER — PANTOPRAZOLE SODIUM 40 MG PO TBEC
80.0000 mg | DELAYED_RELEASE_TABLET | Freq: Every day | ORAL | Status: DC
  Administered 2017-01-22 – 2017-01-24 (×3): 80 mg via ORAL
  Filled 2017-01-21 (×3): qty 2

## 2017-01-21 MED ORDER — EPINEPHRINE PF 1 MG/ML IJ SOLN
INTRAMUSCULAR | Status: DC | PRN
  Administered 2017-01-21: .0001 mL via INTRATHECAL

## 2017-01-21 MED ORDER — GENTAMICIN SULFATE 40 MG/ML IJ SOLN
INTRAMUSCULAR | Status: AC
  Filled 2017-01-21: qty 2

## 2017-01-21 MED ORDER — HEPARIN SODIUM (PORCINE) 10000 UNIT/ML IJ SOLN
INTRAMUSCULAR | Status: AC
  Filled 2017-01-21: qty 3

## 2017-01-21 MED ORDER — FLUTICASONE PROPIONATE 50 MCG/ACT NA SUSP
2.0000 | Freq: Every day | NASAL | Status: DC
  Administered 2017-01-21 – 2017-01-23 (×3): 2 via NASAL
  Filled 2017-01-21: qty 16

## 2017-01-21 MED ORDER — PROPOFOL 10 MG/ML IV BOLUS
INTRAVENOUS | Status: DC | PRN
  Administered 2017-01-21: 30 mg via INTRAVENOUS

## 2017-01-21 MED ORDER — EPINEPHRINE PF 1 MG/ML IJ SOLN
INTRAMUSCULAR | Status: AC
  Filled 2017-01-21: qty 1

## 2017-01-21 MED ORDER — MIDAZOLAM HCL 5 MG/5ML IJ SOLN
INTRAMUSCULAR | Status: DC | PRN
  Administered 2017-01-21: 2 mg via INTRAVENOUS

## 2017-01-21 MED ORDER — ZOLPIDEM TARTRATE 5 MG PO TABS: 5.0000 mg | ORAL_TABLET | Freq: Every evening | ORAL | Status: DC | PRN

## 2017-01-21 MED ORDER — ALUM & MAG HYDROXIDE-SIMETH 200-200-20 MG/5ML PO SUSP
30.0000 mL | ORAL | Status: DC | PRN
  Filled 2017-01-21: qty 30

## 2017-01-21 MED ORDER — VALACYCLOVIR HCL 500 MG PO TABS
2000.0000 mg | ORAL_TABLET | Freq: Every day | ORAL | Status: DC
  Administered 2017-01-22 – 2017-01-23 (×2): 2000 mg via ORAL
  Filled 2017-01-21 (×4): qty 4

## 2017-01-21 MED ORDER — ONDANSETRON HCL 4 MG/2ML IJ SOLN
4.0000 mg | Freq: Once | INTRAMUSCULAR | Status: DC | PRN
Start: 2017-01-21 — End: 2017-01-21

## 2017-01-21 MED ORDER — MIDAZOLAM HCL 2 MG/2ML IJ SOLN
INTRAMUSCULAR | Status: AC
Start: 2017-01-21 — End: ?
  Filled 2017-01-21: qty 2

## 2017-01-21 MED ORDER — SCOPOLAMINE 1 MG/3DAYS TD PT72
MEDICATED_PATCH | TRANSDERMAL | Status: AC
  Administered 2017-01-21: 1.5 mg via TRANSDERMAL
  Filled 2017-01-21: qty 1

## 2017-01-21 MED ORDER — DIPHENHYDRAMINE HCL 12.5 MG/5ML PO ELIX: 12.5000 mg | ORAL_SOLUTION | ORAL | Status: DC | PRN

## 2017-01-21 MED ORDER — OXYCODONE HCL 5 MG PO TABS
5.0000 mg | ORAL_TABLET | ORAL | Status: DC | PRN
  Administered 2017-01-21 – 2017-01-23 (×3): 5 mg via ORAL
  Filled 2017-01-21 (×3): qty 1

## 2017-01-21 MED ORDER — CEFAZOLIN SODIUM-DEXTROSE 2-4 GM/100ML-% IV SOLN: 2.0000 g | Freq: Once | INTRAVENOUS | Status: DC

## 2017-01-21 MED ORDER — INFLUENZA VAC SPLIT QUAD 0.5 ML IM SUSY
0.5000 mL | PREFILLED_SYRINGE | INTRAMUSCULAR | Status: AC
  Administered 2017-01-22: 0.5 mL via INTRAMUSCULAR
  Filled 2017-01-21: qty 0.5

## 2017-01-21 MED ORDER — AMLODIPINE BESYLATE 5 MG PO TABS
5.0000 mg | ORAL_TABLET | Freq: Every day | ORAL | Status: DC
  Administered 2017-01-23: 5 mg via ORAL
  Filled 2017-01-21: qty 1

## 2017-01-21 MED ORDER — GLYCOPYRROLATE 0.2 MG/ML IJ SOLN
INTRAMUSCULAR | Status: DC | PRN
  Administered 2017-01-21: 0.2 mg via INTRAVENOUS

## 2017-01-21 MED ORDER — MOMETASONE FURO-FORMOTEROL FUM 200-5 MCG/ACT IN AERO
2.0000 | INHALATION_SPRAY | Freq: Two times a day (BID) | RESPIRATORY_TRACT | Status: DC
  Administered 2017-01-21 – 2017-01-23 (×4): 2 via RESPIRATORY_TRACT
  Filled 2017-01-21: qty 8.8

## 2017-01-21 MED ORDER — BUPIVACAINE HCL (PF) 0.5 % IJ SOLN
INTRAMUSCULAR | Status: AC
  Filled 2017-01-21: qty 10

## 2017-01-21 MED ORDER — BUPIVACAINE-EPINEPHRINE (PF) 0.25% -1:200000 IJ SOLN
INTRAMUSCULAR | Status: AC
  Filled 2017-01-21: qty 30

## 2017-01-21 MED ORDER — GENTAMICIN SULFATE 40 MG/ML IJ SOLN
INTRAMUSCULAR | Status: DC | PRN
  Administered 2017-01-21: 16:00:00

## 2017-01-21 MED ORDER — FENTANYL CITRATE (PF) 100 MCG/2ML IJ SOLN
INTRAMUSCULAR | Status: DC | PRN
  Administered 2017-01-21 (×2): 50 ug via INTRAVENOUS

## 2017-01-21 MED ORDER — METOCLOPRAMIDE HCL 10 MG PO TABS
5.0000 mg | ORAL_TABLET | Freq: Three times a day (TID) | ORAL | Status: DC | PRN
  Administered 2017-01-21: 10 mg via ORAL
  Filled 2017-01-21: qty 1

## 2017-01-21 MED ORDER — ENOXAPARIN SODIUM 40 MG/0.4ML ~~LOC~~ SOLN
40.0000 mg | SUBCUTANEOUS | Status: DC
  Administered 2017-01-22 – 2017-01-24 (×3): 40 mg via SUBCUTANEOUS
  Filled 2017-01-21 (×3): qty 0.4

## 2017-01-21 MED ORDER — BUPIVACAINE-EPINEPHRINE 0.25% -1:200000 IJ SOLN
INTRAMUSCULAR | Status: DC | PRN
  Administered 2017-01-21: 30 mL

## 2017-01-21 MED ORDER — METOCLOPRAMIDE HCL 5 MG/ML IJ SOLN: 5.0000 mg | Freq: Three times a day (TID) | INTRAMUSCULAR | Status: DC | PRN

## 2017-01-21 MED ORDER — FENTANYL CITRATE (PF) 100 MCG/2ML IJ SOLN
25.0000 ug | INTRAMUSCULAR | Status: DC | PRN
Start: 2017-01-21 — End: 2017-01-21

## 2017-01-21 MED ORDER — ACETAMINOPHEN 325 MG PO TABS
650.0000 mg | ORAL_TABLET | ORAL | Status: DC | PRN
  Administered 2017-01-23: 650 mg via ORAL
  Filled 2017-01-21: qty 2

## 2017-01-21 MED ORDER — ATORVASTATIN CALCIUM 20 MG PO TABS
40.0000 mg | ORAL_TABLET | Freq: Every day | ORAL | Status: DC
  Administered 2017-01-21 – 2017-01-23 (×3): 40 mg via ORAL
  Filled 2017-01-21 (×3): qty 2

## 2017-01-21 MED ORDER — CEFAZOLIN SODIUM-DEXTROSE 2-4 GM/100ML-% IV SOLN
INTRAVENOUS | Status: AC
  Filled 2017-01-21: qty 100

## 2017-01-21 MED ORDER — LIDOCAINE HCL (PF) 2 % IJ SOLN
INTRAMUSCULAR | Status: AC
  Filled 2017-01-21: qty 10

## 2017-01-21 MED ORDER — BUPROPION HCL ER (XL) 150 MG PO TB24
300.0000 mg | ORAL_TABLET | Freq: Every day | ORAL | Status: DC
  Administered 2017-01-22 – 2017-01-24 (×3): 300 mg via ORAL
  Filled 2017-01-21 (×3): qty 2

## 2017-01-21 MED ORDER — PROPOFOL 500 MG/50ML IV EMUL
INTRAVENOUS | Status: DC | PRN
  Administered 2017-01-21: 75 ug/kg/min via INTRAVENOUS

## 2017-01-21 MED ORDER — CEFAZOLIN SODIUM-DEXTROSE 2-4 GM/100ML-% IV SOLN: 2.0000 g | Freq: Four times a day (QID) | INTRAVENOUS | Status: DC

## 2017-01-21 MED ORDER — SCOPOLAMINE 1 MG/3DAYS TD PT72
1.0000 | MEDICATED_PATCH | Freq: Once | TRANSDERMAL | Status: DC
  Administered 2017-01-21: 1.5 mg via TRANSDERMAL

## 2017-01-21 MED ORDER — ACETAMINOPHEN 650 MG RE SUPP: 650.0000 mg | RECTAL | Status: DC | PRN

## 2017-01-21 MED ORDER — VORTIOXETINE HBR 5 MG PO TABS
1.0000 | ORAL_TABLET | Freq: Every day | ORAL | Status: DC
  Filled 2017-01-21: qty 1

## 2017-01-21 MED ORDER — MORPHINE SULFATE (PF) 2 MG/ML IV SOLN: 2.0000 mg | INTRAVENOUS | Status: DC | PRN

## 2017-01-21 MED ORDER — VORTIOXETINE HBR 10 MG PO TABS
5.0000 mg | ORAL_TABLET | Freq: Every day | ORAL | Status: DC
  Administered 2017-01-21 – 2017-01-24 (×4): 5 mg via ORAL
  Filled 2017-01-21 (×4): qty 1

## 2017-01-21 MED ORDER — SODIUM CHLORIDE 0.9 % IR SOLN
Status: DC | PRN
  Administered 2017-01-21: 14:00:00

## 2017-01-21 MED ORDER — BUPIVACAINE HCL (PF) 0.5 % IJ SOLN
INTRAMUSCULAR | Status: DC | PRN
  Administered 2017-01-21: 2.5 mL via INTRATHECAL

## 2017-01-21 MED ORDER — MAGNESIUM HYDROXIDE 400 MG/5ML PO SUSP
30.0000 mL | Freq: Every day | ORAL | Status: DC | PRN
  Administered 2017-01-22: 30 mL via ORAL
  Filled 2017-01-21: qty 30

## 2017-01-21 MED ORDER — SODIUM CHLORIDE 0.9 % IV SOLN
INTRAVENOUS | Status: DC | PRN
  Administered 2017-01-21: 10 ug/min via INTRAVENOUS

## 2017-01-21 MED ORDER — ALBUTEROL SULFATE (2.5 MG/3ML) 0.083% IN NEBU: 2.5000 mg | INHALATION_SOLUTION | Freq: Four times a day (QID) | RESPIRATORY_TRACT | Status: DC | PRN

## 2017-01-21 MED ORDER — DOCUSATE SODIUM 100 MG PO CAPS
100.0000 mg | ORAL_CAPSULE | Freq: Two times a day (BID) | ORAL | Status: DC
  Administered 2017-01-21 – 2017-01-24 (×6): 100 mg via ORAL
  Filled 2017-01-21 (×6): qty 1

## 2017-01-21 MED ORDER — BISACODYL 10 MG RE SUPP
10.0000 mg | Freq: Every day | RECTAL | Status: DC | PRN
  Filled 2017-01-21: qty 1

## 2017-01-21 MED ORDER — OXYCODONE HCL 5 MG PO TABS
10.0000 mg | ORAL_TABLET | ORAL | Status: DC | PRN
  Administered 2017-01-23 – 2017-01-24 (×6): 10 mg via ORAL
  Filled 2017-01-21 (×7): qty 2

## 2017-01-21 MED ORDER — FENTANYL CITRATE (PF) 100 MCG/2ML IJ SOLN
INTRAMUSCULAR | Status: AC
  Filled 2017-01-21: qty 2

## 2017-01-21 MED ORDER — ONDANSETRON HCL 4 MG PO TABS
4.0000 mg | ORAL_TABLET | Freq: Four times a day (QID) | ORAL | Status: DC | PRN
  Administered 2017-01-22 – 2017-01-23 (×2): 4 mg via ORAL
  Filled 2017-01-21 (×2): qty 1

## 2017-01-21 MED ORDER — DEXTROSE 5 % IV SOLN
2.0000 g | Freq: Four times a day (QID) | INTRAVENOUS | Status: AC
  Administered 2017-01-21 – 2017-01-22 (×3): 2 g via INTRAVENOUS
  Filled 2017-01-21 (×3): qty 2000

## 2017-01-21 SURGICAL SUPPLY — 72 items
BLADE SAW SAG 18.5X105 (BLADE) ×3 IMPLANT
BNDG COHESIVE 6X5 TAN STRL LF (GAUZE/BANDAGES/DRESSINGS) ×9 IMPLANT
BUR SABER TAPERED DIAMOND 2 (BURR) ×2 IMPLANT
CANISTER SUCT 1200ML W/VALVE (MISCELLANEOUS) ×3 IMPLANT
CARTRIDGE OIL MAESTRO DRILL (MISCELLANEOUS) IMPLANT
CELL SAVER ADDITIONAL TIME PER (MISCELLANEOUS) ×360
CELL SAVER AUTOCOAGULATE (MISCELLANEOUS) ×3
CELL SAVER COLL SVCS (MISCELLANEOUS) ×3
CELL SAVER FILTER LIPID PALL S (MISCELLANEOUS) ×3
CHLORAPREP W/TINT 26ML (MISCELLANEOUS) ×3 IMPLANT
DIFFUSER MAESTRO (MISCELLANEOUS) ×2 IMPLANT
DRAPE C-ARM XRAY 36X54 (DRAPES) ×3 IMPLANT
DRAPE INCISE IOBAN 66X60 STRL (DRAPES) IMPLANT
DRAPE POUCH INSTRU U-SHP 10X18 (DRAPES) ×3 IMPLANT
DRAPE SHEET LG 3/4 BI-LAMINATE (DRAPES) ×9 IMPLANT
DRAPE TABLE BACK 80X90 (DRAPES) ×3 IMPLANT
DRESSING SURGICEL FIBRLLR 1X2 (HEMOSTASIS) ×2 IMPLANT
DRSG OPSITE POSTOP 4X8 (GAUZE/BANDAGES/DRESSINGS) ×6 IMPLANT
DRSG SURGICEL FIBRILLAR 1X2 (HEMOSTASIS) ×6
ELECT BLADE 6.5 EXT (BLADE) ×3 IMPLANT
ELECT REM PT RETURN 9FT ADLT (ELECTROSURGICAL) ×3
ELECTRODE REM PT RTRN 9FT ADLT (ELECTROSURGICAL) ×1 IMPLANT
EVACUATOR 1/8 PVC DRAIN (DRAIN) IMPLANT
FILTER LIPID PALL S CELL SAVER (MISCELLANEOUS) IMPLANT
GLOVE BIOGEL PI IND STRL 9 (GLOVE) ×1 IMPLANT
GLOVE BIOGEL PI INDICATOR 9 (GLOVE) ×2
GLOVE SURG SYN 9.0  PF PI (GLOVE) ×4
GLOVE SURG SYN 9.0 PF PI (GLOVE) ×2 IMPLANT
GOWN SRG 2XL LVL 4 RGLN SLV (GOWNS) ×1 IMPLANT
GOWN STRL NON-REIN 2XL LVL4 (GOWNS) ×3
GOWN STRL REUS W/ TWL LRG LVL3 (GOWN DISPOSABLE) ×1 IMPLANT
GOWN STRL REUS W/TWL LRG LVL3 (GOWN DISPOSABLE) ×3
HIP FEM HD XL 28 (Head) ×2 IMPLANT
HOLDER FOLEY CATH W/STRAP (MISCELLANEOUS) ×3 IMPLANT
HOOD PEEL AWAY FLYTE STAYCOOL (MISCELLANEOUS) ×3 IMPLANT
KIT PREVENA INCISION MGT 13 (CANNISTER) ×1 IMPLANT
LINER ACETABULAR 28M 10D (Liner) ×2 IMPLANT
LINER LOCKING PIN SROM (Hips) IMPLANT
MAT BLUE FLOOR 46X72 FLO (MISCELLANEOUS) ×1 IMPLANT
NDL SAFETY ECLIPSE 18X1.5 (NEEDLE) ×1 IMPLANT
NDL SPNL 18GX3.5 QUINCKE PK (NEEDLE) ×1 IMPLANT
NEEDLE HYPO 18GX1.5 SHARP (NEEDLE) ×3
NEEDLE SPNL 18GX3.5 QUINCKE PK (NEEDLE) ×3 IMPLANT
NS IRRIG 1000ML POUR BTL (IV SOLUTION) ×3 IMPLANT
OIL CARTRIDGE MAESTRO DRILL (MISCELLANEOUS) ×3
PACK HIP COMPR (MISCELLANEOUS) ×3 IMPLANT
PULSAVAC PLUS IRRIG FAN TIP (DISPOSABLE) ×3
SAVE CELL AUTOCOAG (MISCELLANEOUS) IMPLANT
SAVER CELL COLL SVCS (MISCELLANEOUS) IMPLANT
SCREW PERIPH 5.0X35 LG S-ROM (Screw) ×2 IMPLANT
SOL PREP PVP 2OZ (MISCELLANEOUS) ×3
SOLUTION PREP PVP 2OZ (MISCELLANEOUS) ×1 IMPLANT
SPONGE DRAIN TRACH 4X4 STRL 2S (GAUZE/BANDAGES/DRESSINGS) ×3 IMPLANT
SROM LINER LOCKING PIN (Hips) ×3 IMPLANT
STAPLER SKIN PROX 35W (STAPLE) ×3 IMPLANT
STEM FEMORAL SZ2 STD COLLARED (Stem) ×2 IMPLANT
STRAP SAFETY BODY (MISCELLANEOUS) ×5 IMPLANT
SUT DVC 2 QUILL PDO  T11 36X36 (SUTURE) ×2
SUT DVC 2 QUILL PDO T11 36X36 (SUTURE) ×1 IMPLANT
SUT SILK 0 (SUTURE) ×3
SUT SILK 0 30XBRD TIE 6 (SUTURE) ×1 IMPLANT
SUT V-LOC 90 ABS DVC 3-0 CL (SUTURE) ×3 IMPLANT
SUT VIC AB 1 CT1 36 (SUTURE) ×3 IMPLANT
SYR 20CC LL (SYRINGE) ×3 IMPLANT
SYR 30ML LL (SYRINGE) ×3 IMPLANT
SYR BULB IRRIG 60ML STRL (SYRINGE) ×3 IMPLANT
TAPE MICROFOAM 4IN (TAPE) ×3 IMPLANT
TIME ADDITIONAL PER CELL SAVER (MISCELLANEOUS) IMPLANT
TIP FAN IRRIG PULSAVAC PLUS (DISPOSABLE) IMPLANT
TOWEL OR 17X26 4PK STRL BLUE (TOWEL DISPOSABLE) ×3 IMPLANT
TRAY FOLEY W/METER SILVER 16FR (SET/KITS/TRAYS/PACK) ×3 IMPLANT
WND VAC CANISTER 500ML (MISCELLANEOUS) ×3 IMPLANT

## 2017-01-21 NOTE — H&P (Signed)
Reviewed paper H+P, will be scanned into chart. No changes noted.  

## 2017-01-21 NOTE — Progress Notes (Signed)
[facility] responded to an OR for an AD. RN stated that the Pt is recently out of surgery and may still be a bit groggy. [place] left materials with the Pt for her to review at a time when she is clearer thinking.    01/21/17 1900  Clinical Encounter Type  Visited With Patient;Health care provider  Visit Type Initial;Spiritual support;Post-op  Referral From Nurse  Spiritual Encounters  Spiritual Needs Literature

## 2017-01-21 NOTE — Anesthesia Post-op Follow-up Note (Signed)
Anesthesia QCDR form completed.        

## 2017-01-21 NOTE — OR Nursing (Signed)
Explanted 4 implants Stem,sleeve,head and liner

## 2017-01-21 NOTE — Transfer of Care (Signed)
Immediate Anesthesia Transfer of Care Note  Patient: Amy Rocha  Procedure(s) Performed: TOTAL HIP REVISION FEMORAL STEM WITH POLYETHYLENE EXCHANGE WITH CUPLINER (Left Hip)  Patient Location: PACU  Anesthesia Type:Spinal  Level of Consciousness: awake, alert  and oriented  Airway & Oxygen Therapy: Patient Spontanous Breathing and Patient connected to face mask oxygen  Post-op Assessment: Report given to RN and Post -op Vital signs reviewed and stable  Post vital signs: Reviewed and stable  Last Vitals:  Vitals:   01/21/17 1138 01/21/17 1709  BP: 135/81 109/76  Pulse: 82 80  Resp: 20 (!) 21  Temp: 36.7 C 36.9 C  SpO2: 97% 99%    Last Pain:  Vitals:   01/21/17 1138  TempSrc: Tympanic  PainSc: 7          Complications: No apparent anesthesia complications

## 2017-01-21 NOTE — Anesthesia Procedure Notes (Signed)
Date/Time: 01/21/2017 1:26 PM Performed by: Johnna Acosta, CRNA Pre-anesthesia Checklist: Patient identified, Emergency Drugs available, Suction available, Patient being monitored and Timeout performed Patient Re-evaluated:Patient Re-evaluated prior to induction Oxygen Delivery Method: Nasal cannula

## 2017-01-21 NOTE — Anesthesia Preprocedure Evaluation (Addendum)
Anesthesia Evaluation  Patient identified by MRN, date of birth, ID band Patient awake    History of Anesthesia Complications (+) PONV and history of anesthetic complications  Airway Mallampati: III  TM Distance: <3 FB     Dental  (+) Chipped, Caps   Pulmonary asthma , COPD, former smoker,    Pulmonary exam normal        Cardiovascular hypertension, Pt. on medications + CAD, + Peripheral Vascular Disease and +CHF  Normal cardiovascular exam     Neuro/Psych PSYCHIATRIC DISORDERS Anxiety Depression  Neuromuscular disease    GI/Hepatic GERD  ,  Endo/Other  negative endocrine ROS  Renal/GU Renal InsufficiencyRenal disease     Musculoskeletal  (+) Arthritis , Osteoarthritis,    Abdominal Normal abdominal exam  (+)   Peds  Hematology  (+) anemia ,   Anesthesia Other Findings   Reproductive/Obstetrics                            Anesthesia Physical Anesthesia Plan  ASA: III  Anesthesia Plan: Spinal   Post-op Pain Management:    Induction: Intravenous  PONV Risk Score and Plan:   Airway Management Planned: Nasal Cannula  Additional Equipment:   Intra-op Plan:   Post-operative Plan:   Informed Consent: I have reviewed the patients History and Physical, chart, labs and discussed the procedure including the risks, benefits and alternatives for the proposed anesthesia with the patient or authorized representative who has indicated his/her understanding and acceptance.   Dental advisory given  Plan Discussed with: CRNA and Surgeon  Anesthesia Plan Comments:         Anesthesia Quick Evaluation

## 2017-01-21 NOTE — Anesthesia Procedure Notes (Signed)
Spinal  Patient location during procedure: OR Staffing Anesthesiologist: Carroll, Paul, MD Resident/CRNA: Dimond Crotty, CRNA Performed: resident/CRNA  Preanesthetic Checklist Completed: patient identified, site marked, surgical consent, pre-op evaluation, timeout performed, IV checked, risks and benefits discussed and monitors and equipment checked Spinal Block Patient position: sitting Prep: ChloraPrep and site prepped and draped Patient monitoring: heart rate, continuous pulse ox, blood pressure and cardiac monitor Approach: midline Location: L4-5 Injection technique: single-shot Needle Needle type: Introducer and Pencan  Needle gauge: 24 G Needle length: 9 cm Additional Notes Negative paresthesia. Negative blood return. Positive free-flowing CSF. Expiration date of kit checked and confirmed. Patient tolerated procedure well, without complications.       

## 2017-01-21 NOTE — Progress Notes (Signed)
15 minute call to floor. 

## 2017-01-21 NOTE — Progress Notes (Signed)
Blood pressure 99/61. Dr. Posey Pronto contacted. Order to hold Norvasc obtained.

## 2017-01-21 NOTE — Op Note (Signed)
01/21/2017  5:02 PM  PATIENT:  Amy Rocha  61 y.o. female  PRE-OPERATIVE DIAGNOSIS:  Painful total hip left  POST-OPERATIVE DIAGNOSIS:  Painful left total hip  PROCEDURE:  Procedure(s): TOTAL HIP REVISION FEMORAL STEM WITH POLYETHYLENE EXCHANGE WITH CUPLINER (Left)  SURGEON: Laurene Footman, MD  ASSISTANTS: None  ANESTHESIA:   spinal  EBL:  Total I/O In: 750 [I.V.:500; Blood:250] Out: 088 [Urine:130; Blood:675]  BLOOD ADMINISTERED:375 CC CELLSAVER  DRAINS: none   LOCAL MEDICATIONS USED:  MARCAINE     SPECIMEN:  No Specimen  DISPOSITION OF SPECIMEN:  N/A  COUNTS:  YES  TOURNIQUET:  * No tourniquets in log *  IMPLANTS: S-ROM Marathon acetabular liner size and 10 hooded liner with locking pin and screw, Medacta 28 mm metal XL head with AMIS standard femoral stem collared size 2  DICTATION: .Dragon Dictation patient brought the operating room and after adequate anesthesia was obtained the patient was placed prone on the operative table with the Medacta attachment of the left foot in the Ashton boot. C-arm was brought in and preop x-ray taken as a template. After prepping and draping in usual sterile fashion appropriate patient identification and timeout procedures were completed. An anterior approach is made centered over the TFL and TFL muscle was incised with the muscle retracted laterally deep retractors placed and anterior capsulotomy performed. Inspection revealed significant fluid within the joint and significant polyethylene wear with a groove worn through the posterior aspect of the cup consistent with subluxation. The head was dislocated and the head was removed. The trunnion was exposed and the device to separate the S-ROM sleeve from the cone was inserted in the separated this out bringing the leg down and in Trendelenburg allowed for removal of the S-ROM stem. With a cyst very small burn thin flexible osteotomes the cone was removed with some bone from the medial  calcar. After this was removed the liner was inserted and this was with a great of difficulty removing the old liner with 2 with a screw and a peg and again the Her to rotate and lock was somewhat difficult secondary to scar tissue but this was ultimately done and a set screw and pin were used to maintain rotation of the M liner. The femur was sequentially broached and a size 2 on this broach gave a very snug fit and was rotationally stable was chosen as the final implant with trials in XL head was chosen as it seemed to give appropriate leg length and stability the final head was impacted onto the stem the hip was reduced with local anesthetic infiltrated in the para-articular tissues fibrillar placed along the posterior capsule and medial capsule to aid in postop hemostasis. The hip was irrigated throughout the case and Cell Saver was utilized with fists of Cell Saver blood given being given back to the patient. The deep the TFL fascia was repaired using a heavy Quill followed by a subcuticular closure and skin staples with an incisional wound VAC applied  PLAN OF CARE: Admit to inpatient   PATIENT DISPOSITION:  PACU - hemodynamically stable.

## 2017-01-21 NOTE — H&P (Signed)
Chief Complaint  Patient presents with  . Follow-up  L Hip Pain.   History of the Present Illness: Amy Rocha is a 61 y.o. female here for follow-up of left hip pain. The patient has history of a total of 4 surgeries on her left hip, which were all performed by Dr. Leontine Locket. The most recent surgery was performed in 2003. She explains that her first surgery was a free fibular graft for avascular necrosis, which failed. She later sustained a femur fracture, and had revision with allograft of the femur, and a total hip arthroplasty. The patient has been seen previously by Dr. Primus Bravo at the pain clinic.   The patient describes severe pain which is constant, and exacerbated while weight bearing. It is so painful she cannot place her left foot on the floor. She denies experiencing pain while lying flat on her bed. The patient denies having prior bloodwork to rule out infection.   The patient has history of right primary total hip arthroplasty in 2005. She also had a spinal cord stimulator placed in 2005, which resulted in a staph infection.   The patient is accompanied today by her husband. She denies having any current medical issues. She states that she was last seen by Dr. Ubaldo Glassing, her cardiologist, sometime this year. However she is unsure of the date.  I have reviewed past medical, surgical, social and family history, and allergies as documented in the EMR.  Past Medical History: Past Medical History:  Diagnosis Date  . Anxiety, unspecified  . Depression, unspecified  . Hypertension   Past Surgical History: Past Surgical History:  Procedure Laterality Date  . bilateral hip replacement  . FUNCTIONAL ENDOSCOPIC SINUS SURGERY   Past Family History: Family History  Problem Relation Age of Onset  . No Known Problems Mother  . No Known Problems Father   Medications: Current Outpatient Medications Ordered in Epic  Medication Sig Dispense Refill  . amLODIPine (NORVASC) 5 MG tablet Take  1 tablet by mouth once daily.  Marland Kitchen atorvastatin (LIPITOR) 40 MG tablet Take 1 tablet by mouth once daily.  Marland Kitchen buPROPion (WELLBUTRIN XL) 300 MG XL tablet 1 tab by mouth daily  . cromolyn (CROLOM) 4 % ophthalmic solution Apply to eye.  . DULoxetine (CYMBALTA) 60 MG DR capsule 1 cap by mouth daily  . fluticasone (FLONASE) 50 mcg/actuation nasal spray Place 2 sprays into both nostrils once daily 6  . LORazepam (ATIVAN) 0.5 MG tablet 0.5 mg prn as needed  . omeprazole (PRILOSEC) 20 MG DR capsule Take 20 mg by mouth once daily  . oxyCODONE (OXYCONTIN) 20 MG CR tablet 1 tab by mouth every 12 hours  . valacyclovir (VALTREX) 500 MG tablet 500 mg prn as needed   No current Epic-ordered facility-administered medications on file.   Allergies: Allergies  Allergen Reactions  . Fluticasone-Salmeterol Other (See Comments) and Swelling  Tongue sores Tongue sores  . Lactose Unknown  Bloating, GI upset, diarrhea  . Tetracyclines Unknown    Body mass index is 27.29 kg/m.  Review of Systems:A comprehensive 14 point ROS was performed, reviewed, and the pertinent orthopaedic findings are documented in the HPI.  Vitals:  12/16/16 1438  BP: 128/84   General Physical Examination:  General/Constitutional: No apparent distress: well-nourished and well developed. Eyes: Pupils equal, round with synchronous movement. Lungs: Clear to auscultation HEENT: Normal Vascular: No edema, swelling or tenderness, except as noted in detailed exam. Cardiac: Heart rate and rhythm is regular. Integumentary: No impressive skin lesions present,  except as noted in detailed exam. Neuro/Psych: Normal mood and affect, oriented to person, place and time.  Musculoskeletal Examination: On examination of the left hip, the patient is slender, and I can palpate her bone. She has a well healed long posterior incision, approximately 40 cm in length.N/v intact, equal leg lengths.  Radiographs: MRI of the lumbar spine obtained on  12/02/2016 was reviewed by me today, which showed no evidence that would explain her current pain.   Bone scan performed on 12/11/2016 was reviewed by me today, which showed increased uptake in mid-stem.  Assessment: ICD-10-CM ICD-9-CM  1. Status post revision of left total hip x4 Z96.649 V43.64  2. Multiple joint pain, unspecified M25.50 719.49   The patient is experiencing left hip pain, 15 years status post left total hip arthroplasty.  Plan: There is increased uptake in the mid-stem of her left total hip arthroplasty, seen on her bone scan. I suspect the stem may be the source of her pain. I recommend we obtain labs, checking sedimentation rate and c-reactive protein levels to rule out infection. As long as her results are good, we will proceed with scheduling her for revision left total hip arthroplasty of her S-ROM to a shorter stem. We will attempt to obtain records of her previous operative note, and if we cannot obtain it, we will contact the implant manufacturer of her acetabular cup to see what kind it is, and do a possible polyethylene liner exchange as well.   Surgical Risks:  The nature of the condition and the proposed procedure has been reviewed in detail with the patient. Surgical versus non-surgical options and prognosis for recovery have been reviewed and the inherent risks and benefits of each have been discussed including the risks of infection, bleeding, injury to nerves / blood vessels / tendons, incomplete relief of symptoms, persisting pain and / or stiffness, loss of function, complex regional pain syndrome, failure of procedure, as appropriate.  Scribe Attestation: Veda Canning, am acting as scribe for Lauris Poag, MD

## 2017-01-22 ENCOUNTER — Encounter: Payer: Self-pay | Admitting: Orthopedic Surgery

## 2017-01-22 LAB — BASIC METABOLIC PANEL
ANION GAP: 7 (ref 5–15)
BUN: 15 mg/dL (ref 6–20)
CHLORIDE: 108 mmol/L (ref 101–111)
CO2: 19 mmol/L — AB (ref 22–32)
Calcium: 8.1 mg/dL — ABNORMAL LOW (ref 8.9–10.3)
Creatinine, Ser: 1.14 mg/dL — ABNORMAL HIGH (ref 0.44–1.00)
GFR calc non Af Amer: 50 mL/min — ABNORMAL LOW (ref >60)
GFR, EST AFRICAN AMERICAN: 58 mL/min — AB (ref >60)
GLUCOSE: 149 mg/dL — AB (ref 65–99)
Potassium: 4.5 mmol/L (ref 3.5–5.1)
Sodium: 134 mmol/L — ABNORMAL LOW (ref 135–145)

## 2017-01-22 LAB — CBC
HEMATOCRIT: 37.5 % (ref 35.0–47.0)
HEMOGLOBIN: 12.5 g/dL (ref 12.0–16.0)
MCH: 30.3 pg (ref 26.0–34.0)
MCHC: 33.3 g/dL (ref 32.0–36.0)
MCV: 91 fL (ref 80.0–100.0)
Platelets: 291 10*3/uL (ref 150–440)
RBC: 4.13 MIL/uL (ref 3.80–5.20)
RDW: 13.9 % (ref 11.5–14.5)
WBC: 13.8 10*3/uL — AB (ref 3.6–11.0)

## 2017-01-22 MED ORDER — SODIUM CHLORIDE 0.9 % IV BOLUS (SEPSIS)
500.0000 mL | Freq: Once | INTRAVENOUS | Status: AC
  Administered 2017-01-22: 500 mL via INTRAVENOUS

## 2017-01-22 NOTE — Evaluation (Signed)
Physical Therapy Evaluation Patient Details Name: Amy Rocha MRN: [id number] DOB: 01/08/1956 Today's Date: 01/22/2017   History of Present Illness  Pt is a 61 y.o. female with left hip pain. The patient has history of a total of 4 surgeries on her left hip, which were all performed by Dr. Leontine Locket. The most recent surgery was performed in 2003. She explains that her first surgery was a free fibular graft for avascular necrosis, which failed. She later sustained a femur fracture, and had revision with allograft of the femur, and a total hip arthroplasty.  Pt is no s/p Left total hip revision femoral stem with polyethylene exchange with cupliner.    Clinical Impression  Pt presents with deficits in strength, transfers, mobility, gait, balance, and activity tolerance.  Pt required max A with bed mobility tasks and mod A during sit to/from stand transfers both with extensive cueing for sequencing.  Pt education provided regarding PWB status and sequencing required for amb but upon standing pt was unable to attempt a step secondary to L hip pain.  Pt highly guarded throughout session with any attempts to move LLE but was motivated and did attempt all tasks asked of her during session.  Pt's BP at rest in supine 90/50 mmHg with nursing giving ok to attempt mobility with pt.  Upon sitting at EOB BP 97/66 mmHg with no adverse symptoms.  Pt unable to stand long enough to attempt to get BP in standing but BP taken again once pt returned to sitting with reading of 99/61 mmHg again with no adverse symptoms.  HR 102 bpm at rest and 116 after standing with SpO2 >/= 92% throughout session.  Pt will benefit from PT services in a SNF setting upon discharge to safely address above deficits for decreased caregiver assistance and eventual return to PLOF.      Follow Up Recommendations SNF    Equipment Recommendations  None recommended by PT    Recommendations for Other Services       Precautions /  Restrictions Precautions Precautions: Anterior Hip;Fall Precaution Booklet Issued: Yes (comment) Restrictions Weight Bearing Restrictions: Yes LLE Weight Bearing: Partial weight bearing LLE Partial Weight Bearing Percentage or Pounds: 50%      Mobility  Bed Mobility Overal bed mobility: Needs Assistance Bed Mobility: Supine to Sit;Sit to Supine     Supine to sit: Max assist Sit to supine: Max assist   General bed mobility comments: Max verbal cues for sequencing  Transfers Overall transfer level: Needs assistance Equipment used: Rolling walker (2 wheeled) Transfers: Sit to/from Stand Sit to Stand: Mod assist         General transfer comment: Mod verbal cues for sequencing  Ambulation/Gait             General Gait Details: PWB sequencing education provided to pt prior to standing but pt unable to attempt step secondary to L hip pain in standing  Stairs            Wheelchair Mobility    Modified Rankin (Stroke Patients Only)       Balance Overall balance assessment: Needs assistance Sitting-balance support: Feet unsupported;Feet supported;Single extremity supported Sitting balance-Leahy Scale: Good     Standing balance support: Bilateral upper extremity supported Standing balance-Leahy Scale: Fair                               Pertinent Vitals/Pain Pain Assessment: 0-10 Pain Score: 10-Worst pain  ever Pain Location: L hip Pain Descriptors / Indicators: Aching;Operative site guarding;Sore Pain Intervention(s): Premedicated before session;Monitored during session;Limited activity within patient's tolerance    Home Living Family/patient expects to be discharged to:: Private residence Living Arrangements: Spouse/significant other Available Help at Discharge: Family;Available 24 hours/day Type of Home: House Home Access: Stairs to enter Entrance Stairs-Rails: Left Entrance Stairs-Number of Steps: 1 Home Layout: One level Home  Equipment: Walker - 2 wheels;Cane - single point;Wheelchair - manual      Prior Function Level of Independence: Needs assistance   Gait / Transfers Assistance Needed: Mod I with amb with RW in community and with [place] in home with no recent fall history; Ind with bed mobility and transfers  ADL's / Homemaking Assistance Needed: Mostly Ind with ADLs with spouse assisting occasionally for donning/doffing socks and shoes        Hand Dominance        Extremity/Trunk Assessment   Upper Extremity Assessment Upper Extremity Assessment: Overall WFL for tasks assessed    Lower Extremity Assessment Lower Extremity Assessment: Generalized weakness;LLE deficits/detail LLE Deficits / Details: Sensation to light touch intact to BLEs with B ankle DF/PF AROM present but limited on L by pain LLE: Unable to fully assess due to pain       Communication   Communication: No difficulties  Cognition Arousal/Alertness: Awake/alert Behavior During Therapy: WFL for tasks assessed/performed Overall Cognitive Status: Within Functional Limits for tasks assessed                                        General Comments      Exercises Total Joint Exercises Ankle Circles/Pumps: AROM;Both;10 reps(Limited on L by L hip pain) Quad Sets: Strengthening;Both;10 reps(Limited on L by L hip pain) Gluteal Sets: Strengthening;Both;10 reps Hip ABduction/ADduction: AAROM;Left;5 reps Straight Leg Raises: AAROM;Left;5 reps Long Arc Quad: AROM;Both;5 reps;10 reps(limited on L by hip pain) Knee Flexion: AROM;Both;5 reps;10 reps(Limited on L by hip pain) Other Exercises Other Exercises: HEP booklet education and review provided   Assessment/Plan    PT Assessment Patient needs continued PT services  PT Problem List Decreased strength;Decreased activity tolerance;Decreased balance;Decreased knowledge of use of DME;Decreased mobility       PT Treatment Interventions DME instruction;Gait  training;Functional mobility training;Neuromuscular re-education;Balance training;Therapeutic exercise;Therapeutic activities;Patient/family education;Stair training    PT Goals (Current goals can be found in the Care Plan section)  Acute Rehab PT Goals Patient Stated Goal: To walk better PT Goal Formulation: With patient Time For Goal Achievement: 02/04/17 Potential to Achieve Goals: Good    Frequency BID   Barriers to discharge Inaccessible home environment      Co-evaluation               AM-PAC PT "6 Clicks" Daily Activity  Outcome Measure Difficulty turning over in bed (including adjusting bedclothes, sheets and blankets)?: Unable Difficulty moving from lying on back to sitting on the side of the bed? : Unable Difficulty sitting down on and standing up from a chair with arms (e.g., wheelchair, bedside commode, etc,.)?: Unable Help needed moving to and from a bed to chair (including a wheelchair)?: Total Help needed walking in hospital room?: Total Help needed climbing 3-5 steps with a railing? : Total 6 Click Score: 6    End of Session Equipment Utilized During Treatment: Gait belt Activity Tolerance: Patient limited by pain Patient left: in bed;with call bell/phone within  reach;with bed alarm set Nurse Communication: Mobility status;Weight bearing status PT Visit Diagnosis: Other abnormalities of gait and mobility (R26.89);Muscle weakness (generalized) (M62.81)    Time: 8159-4707 PT Time Calculation (min) (ACUTE ONLY): 37 min   Charges:   PT Evaluation $PT Eval Low Complexity: 1 Low PT Treatments $Therapeutic Exercise: 8-22 mins   PT G Codes:   PT G-Codes **NOT FOR INPATIENT CLASS** Functional Assessment Tool Used: AM-PAC 6 Clicks Basic Mobility Functional Limitation: Mobility: Walking and moving around Mobility: Walking and Moving Around Current Status (A1518): 100 percent impaired, limited or restricted Mobility: Walking and Moving Around Goal Status  (D4373): At least 40 percent but less than 60 percent impaired, limited or restricted    D. Scott Nasra Counce PT, DPT 01/22/17, 11:02 AM

## 2017-01-22 NOTE — Clinical Social Work Placement (Signed)
   CLINICAL SOCIAL WORK PLACEMENT  NOTE  Date:  01/22/2017  Patient Details  Name: Amy Rocha MRN: [id number] Date of Birth: [birth year]  Clinical Social Work is seeking post-discharge placement for this patient at the [place] level of care (*CSW will initial, date and re-position this form in  chart as items are completed):  Yes   Patient/family provided with [facility] Work Department's list of facilities offering this level of care within the geographic area requested by the patient (or if unable, by the patient's family).  Yes   Patient/family informed of their freedom to choose among providers that offer the needed level of care, that participate in Medicare, Medicaid or managed care program needed by the patient, have an available bed and are willing to accept the patient.  Yes   Patient/family informed of [facility]'s ownership interest in [facility] and [facility], as well as of the fact that they are under no obligation to receive care at these facilities.  PASRR submitted to EDS on 01/22/17     PASRR number received on 01/22/17     Existing PASRR number confirmed on       FL2 transmitted to all facilities in geographic area requested by pt/family on 01/22/17     FL2 transmitted to all facilities within larger geographic area on       Patient informed that his/her managed care company has contracts with or will negotiate with certain facilities, including the following:        Yes   Patient/family informed of bed offers received.  Patient chooses bed at [facility] )     Physician recommends and patient chooses bed at      Patient to be transferred to   on  .  Patient to be transferred to facility by       Patient family notified on   of transfer.  Name of family member notified:        PHYSICIAN       Additional Comment:    _______________________________________________ Charmin Aguiniga, Veronia Beets,  LCSW 01/22/2017, 12:36 PM

## 2017-01-22 NOTE — Clinical Social Work Note (Signed)
Clinical Social Work Assessment  Patient Details  Name: Amy Rocha MRN: [id number] Date of Birth: 04-19-1955  Date of referral:  01/22/17               Reason for consult:  Facility Placement                Permission sought to share information with:  Chartered certified accountant granted to share information::  Yes, Verbal Permission Granted  Name::      [name]::   [place]   Relationship::     Contact Information:     Housing/Transportation Living arrangements for the past 2 months:  Single Family Home Source of Information:  Patient, Spouse, Other (Comment Required)(Sister and sister in Sports coach. ) Patient Interpreter Needed:  None Criminal Activity/Legal Involvement Pertinent to Current Situation/Hospitalization:  No - Comment as needed Significant Relationships:  Siblings, Spouse Lives with:  Spouse Do you feel safe going back to the place where you live?  Yes Need for family participation in patient care:  Yes (Comment)  Care giving concerns:  Patient lives in [place] with her husband Amy Rocha.    Social Worker assessment / plan:  Holiday representative (CSW) received SNF consult. PT is recommending SNF. CSW met with patient and her sister in law Amy Rocha, sister Amy Rocha and husband Amy Rocha were at bedside. Patient was alert and oriented X4 and was laying in the bed. CSW introduced self and explained role of CSW department. Patient reported that she lives in [place] with her husband. CSW explained SNF process and that Mcarthur Rossetti will have to approve SNF. Patient verbalized her understanding and is agreeable to SNF search in [facility] and prefers [facility]. FL2 complete and faxed out.   CSW presented bed offers to patient and she chose [facility]. Per [facility] admissions coordinator at [facility] she will start [facility] authorization today. CSW will continue to follow and assist as needed.   Employment status:  Retired Designer, industrial/product PT Recommendations:  [place] / Referral to community resources:  [place]  Patient/Family's Response to care: Patient is agreeable to D/C to [facility].   Patient/Family's Understanding of and Emotional Response to Diagnosis, Current Treatment, and Prognosis:  Patient was very pleasant and thanked CSW for assistance.   Emotional Assessment Appearance:  Appears stated age Attitude/Demeanor/Rapport:    Affect (typically observed):  Accepting, Adaptable, Pleasant Orientation:  Oriented to Self, Oriented to Place, Oriented to  Time, Oriented to Situation Alcohol / Substance use:  Not Applicable Psych involvement (Current and /or in the community):  No (Comment)  Discharge Needs  Concerns to be addressed:  Discharge Planning Concerns Readmission within the last 30 days:  No Current discharge risk:  Dependent with Mobility Barriers to Discharge:  Continued Medical Work up   [employer], Veronia Beets, LCSW 01/22/2017, 12:38 PM

## 2017-01-22 NOTE — NC FL2 (Signed)
[place] LEVEL OF CARE SCREENING TOOL     IDENTIFICATION  Patient Name: Amy Rocha Birthdate: 1955-11-26 Sex: female Admission Date (Current Location): 01/14/2017  [facility] and [place] Number:  [name] ([id number] [facility]) Facility and Address:  [facility], [address], [place], [place] [address]      Provider Number: [id number]  Attending Physician Name and Address:  Hessie Knows, MD  Relative Name and Phone Number:       Current Level of Care: Hospital Recommended Level of Care: [place] Prior Approval Number:    Date Approved/Denied:   PASRR Number: [id number])  Discharge Plan: SNF    Current Diagnoses: Patient Active Problem List   Diagnosis Date Noted  . Failed total hip arthroplasty ([place]) 01/21/2017  . Hypertension 09/25/2016  . Oral-nasal fistula 02/06/2016  . Atherosclerotic peripheral vascular disease ([place]) 09/21/2015  . Insomnia 09/06/2015  . Complex regional pain syndrome of lower extremity 07/11/2015  . Bilateral occipital neuralgia 05/10/2015  . DDD (degenerative disc disease), lumbar 10/26/2014  . Facet syndrome, lumbar 10/26/2014  . History of total hip replacement 10/26/2014  . Avascular necrosis of bones of both hips ([place]) 10/26/2014  . Sacroiliac joint dysfunction 10/26/2014  . Calculus of kidney 09/08/2014  . Acute anxiety 09/08/2014  . GERD (gastroesophageal reflux disease) 09/08/2014  . Joint pain of leg 09/08/2014  . Urge incontinence 09/08/2014  . Hypertensive heart failure ([place]) 09/08/2014  . Avascular necrosis ([place]) 09/08/2014  . Anemia 09/08/2014  . Atherosclerosis of coronary artery 09/08/2014  . IBS (irritable bowel syndrome) 09/08/2014  . Asthma 09/08/2014  . CHF (congestive heart failure) ([place]) 09/08/2014  . Hyperlipidemia 09/08/2014  . COPD (chronic obstructive pulmonary disease) ([place]) 09/08/2014  . Depression 09/08/2014  . Hypertensive CKD (chronic kidney disease)  09/08/2014  . CKD (chronic kidney disease), stage IV ([place]) 09/08/2014  . Proteinuria 09/08/2014  . Chronic pain 09/08/2014    Orientation RESPIRATION BLADDER Height & Weight     Self, Time, Situation, Place  Normal Continent Weight: 155 lb (70.3 kg) Height:  5\' 4"  (162.6 cm)  BEHAVIORAL SYMPTOMS/MOOD NEUROLOGICAL BOWEL NUTRITION STATUS      Continent Diet(Diet: Clear Liquid to be Advanced. )  AMBULATORY STATUS COMMUNICATION OF NEEDS Skin   Extensive Assist Verbally Surgical wounds, Wound Vac(Incision: Left Hip, provena wound vac. )                       Personal Care Assistance Level of Assistance  Bathing, Feeding, Dressing Bathing Assistance: Limited assistance Feeding assistance: Independent Dressing Assistance: Limited assistance     Functional Limitations Info  Sight, Hearing, Speech Sight Info: Adequate Hearing Info: Adequate Speech Info: Adequate    SPECIAL CARE FACTORS FREQUENCY  PT (By licensed PT), OT (By licensed OT)     PT Frequency: (5) OT Frequency: (5)            Contractures      Additional Factors Info  Code Status, Allergies Code Status Info: (Full Code. ) Allergies Info: (Fluticasone-salmeterol, Lactose Intolerance (Gi), Tetracyclines & Related)           Current Medications (01/22/2017):  This is the current hospital active medication list Current Facility-Administered Medications  Medication Dose Route Frequency Provider Last Rate Last Dose  . 0.9 %  sodium chloride infusion   Intravenous Continuous Hessie Knows, MD 100 mL/hr at 01/22/17 0128    . acetaminophen (TYLENOL) tablet 650 mg  650 mg Oral Q4H PRN Hessie Knows,  MD       Or  . acetaminophen (TYLENOL) suppository 650 mg  650 mg Rectal Q4H PRN Hessie Knows, MD      . albuterol (PROVENTIL) (2.5 MG/3ML) 0.083% nebulizer solution 2.5 mg  2.5 mg Inhalation Q6H PRN Hessie Knows, MD      . alum & mag hydroxide-simeth (MAALOX/MYLANTA) 200-200-20 MG/5ML suspension 30 mL  30 mL Oral  Q4H PRN Hessie Knows, MD      . amLODipine (NORVASC) tablet 5 mg  5 mg Oral QHS Hessie Knows, MD      . atorvastatin (LIPITOR) tablet 40 mg  40 mg Oral QHS Hessie Knows, MD   40 mg at 01/21/17 2119  . bisacodyl (DULCOLAX) suppository 10 mg  10 mg Rectal Daily PRN Hessie Knows, MD      . buPROPion (WELLBUTRIN XL) 24 hr tablet 300 mg  300 mg Oral Daily Hessie Knows, MD   300 mg at 01/22/17 0841  . ceFAZolin (ANCEF) 2 g in dextrose 5 % 100 mL IVPB  2 g Intravenous Q6H Hessie Knows, MD 200 mL/hr at 01/22/17 0943 2 g at 01/22/17 0943  . chlorhexidine (HIBICLENS) 4 % liquid   Topical Once Hessie Knows, MD      . diphenhydrAMINE (BENADRYL) 12.5 MG/5ML elixir 12.5-25 mg  12.5-25 mg Oral Q4H PRN Hessie Knows, MD      . docusate sodium (COLACE) capsule 100 mg  100 mg Oral BID Hessie Knows, MD   100 mg at 01/22/17 0840  . enoxaparin (LOVENOX) injection 40 mg  40 mg Subcutaneous Q24H Hessie Knows, MD   40 mg at 01/22/17 0841  . fluticasone (FLONASE) 50 MCG/ACT nasal spray 2 spray  2 spray Each Nare Daily Hessie Knows, MD   2 spray at 01/22/17 [phone]  . magnesium citrate solution 1 Bottle  1 Bottle Oral Once PRN Hessie Knows, MD      . magnesium hydroxide (MILK OF MAGNESIA) suspension 30 mL  30 mL Oral Daily PRN Hessie Knows, MD   30 mL at 01/22/17 0841  . menthol-cetylpyridinium (CEPACOL) lozenge 3 mg  1 lozenge Oral PRN Hessie Knows, MD       Or  . phenol (CHLORASEPTIC) mouth spray 1 spray  1 spray Mouth/Throat PRN Hessie Knows, MD      . methocarbamol (ROBAXIN) tablet 500 mg  500 mg Oral Q6H PRN Hessie Knows, MD       Or  . methocarbamol (ROBAXIN) 500 mg in dextrose 5 % 50 mL IVPB  500 mg Intravenous Q6H PRN Hessie Knows, MD      . metoCLOPramide (REGLAN) tablet 5-10 mg  5-10 mg Oral Q8H PRN Hessie Knows, MD   10 mg at 01/21/17 2306   Or  . metoCLOPramide (REGLAN) injection 5-10 mg  5-10 mg Intravenous Q8H PRN Hessie Knows, MD      . mometasone-formoterol [facility]) 200-5 MCG/ACT inhaler 2  puff  2 puff Inhalation BID Hessie Knows, MD   2 puff at 01/22/17 [phone]  . morphine 2 MG/ML injection 2 mg  2 mg Intravenous Q1H PRN Hessie Knows, MD      . ondansetron [facility]) tablet 4 mg  4 mg Oral Q6H PRN Hessie Knows, MD   4 mg at 01/22/17 0900   Or  . ondansetron (ZOFRAN) injection 4 mg  4 mg Intravenous Q6H PRN Hessie Knows, MD      . oxyCODONE (Oxy IR/ROXICODONE) immediate release tablet 10 mg  10 mg Oral Q3H PRN Hessie Knows, MD      .  oxyCODONE (Oxy IR/ROXICODONE) immediate release tablet 5 mg  5 mg Oral Q3H PRN Hessie Knows, MD   5 mg at 01/21/17 1840  . oxyCODONE (OXYCONTIN) 12 hr tablet 40 mg  40 mg Oral Q12H Hessie Knows, MD   40 mg at 01/22/17 0841  . pantoprazole (PROTONIX) EC tablet 80 mg  80 mg Oral Daily Hessie Knows, MD   80 mg at 01/22/17 0840  . sodium chloride 0.9 % bolus 500 mL  500 mL Intravenous Once Hessie Knows, MD 250 mL/hr at 01/22/17 0900 500 mL at 01/22/17 0900  . valACYclovir (VALTREX) tablet 2,000 mg  2,000 mg Oral Daily Hessie Knows, MD   2,000 mg at 01/22/17 9784  . vortioxetine HBr (TRINTELLIX) TABS 5 mg  5 mg Oral Daily Hessie Knows, MD   5 mg at 01/22/17 7841  . zolpidem (AMBIEN) tablet 5 mg  5 mg Oral QHS PRN Hessie Knows, MD         Discharge Medications: Please see discharge summary for a list of discharge medications.  Relevant Imaging Results:  Relevant Lab Results:   Additional Information (SSN: [id number])  Dortha Neighbors, Veronia Beets, LCSW

## 2017-01-22 NOTE — Progress Notes (Signed)
Patient unable to dangle due to pain issues last night and this am.

## 2017-01-22 NOTE — Progress Notes (Signed)
   Subjective: 1 Day Post-Op Procedure(s) (LRB): TOTAL HIP REVISION FEMORAL STEM WITH POLYETHYLENE EXCHANGE WITH CUPLINER (Left) Patient reports pain as severe.   Patient is complaining of muscle spasms to the left anterior thigh Denies any CP, SOB, ABD pain. We will start therapy today.    Objective: Vital signs in last 24 hours: Temp:  [97.5 F (36.4 C)-99.9 F (37.7 C)] 98.5 F (36.9 C) (12/05 0752) Pulse Rate:  [70-108] 108 (12/05 0752) Resp:  [16-21] 16 (12/05 0752) BP: (79-135)/(54-96) 90/65 (12/05 0752) SpO2:  [92 %-100 %] 92 % (12/05 0752) Weight:  [70.3 kg (155 lb)] 70.3 kg (155 lb) (12/04 1824)  Intake/Output from previous day: 12/04 0701 - 12/05 0700 In: 1506.7 [I.V.:1131.7; Blood:375] Out: 2831 [Urine:880; Blood:675] Intake/Output this shift: No intake/output data recorded.  Recent Labs    01/21/17 1831 01/22/17 0545  HGB 14.9 12.5   Recent Labs    01/21/17 1831 01/22/17 0545  WBC 17.7* 13.8*  RBC 4.87 4.13  HCT 45.1 37.5  PLT 290 291   Recent Labs    01/21/17 1831 01/22/17 0545  NA  --  134*  K  --  4.5  CL  --  108  CO2  --  19*  BUN  --  15  CREATININE 1.22* 1.14*  GLUCOSE  --  149*  CALCIUM  --  8.1*   No results for input(s): LABPT, INR in the last 72 hours.  EXAM General - Patient is Alert, Appropriate and Oriented Extremity - Neurovascular intact Sensation intact distally Intact pulses distally Dorsiflexion/Plantar flexion intact No cellulitis present Compartment soft Dressing - dressing C/D/I and no drainage. Wound vac intact Motor Function - intact, moving foot and toes well on exam.   Past Medical History:  Diagnosis Date  . Anxiety   . Arthritis   . Asthma   . Avascular necrosis ([place])   . Blood transfusion without reported diagnosis   . Calculus of kidney   . CHF (congestive heart failure) ([place])   . Chronic pain syndrome   . Complication of anesthesia    aspirated in recovery after  hip replacement 1998  . COPD  (chronic obstructive pulmonary disease) ([place])   . Depression   . GERD (gastroesophageal reflux disease)   . Heart failure ([place])   . Hyperlipidemia   . Hypertension   . IBS (irritable bowel syndrome)   . Joint pain   . Osteoporosis   . Oxygen deficiency   . Peripheral vascular disease ([place])   . PONV (postoperative nausea and vomiting)   . Urge incontinence     Assessment/Plan:   1 Day Post-Op Procedure(s) (LRB): TOTAL HIP REVISION FEMORAL STEM WITH POLYETHYLENE EXCHANGE WITH CUPLINER (Left) Active Problems:   Failed total hip arthroplasty ([place])  Estimated body mass index is 26.61 kg/m as calculated from the following:   Height as of this encounter: 5\' 4"  (1.626 m).   Weight as of this encounter: 70.3 kg (155 lb). Advance diet Up with therapy, partial weightbearing left lower extremity Needs bowel movement Continue to monitor blood pressure.  Hold blood pressure medications.  Will give bolus fluids this morning. CM to assist with discharge  DVT Prophylaxis - Lovenox, Foot Pumps and TED hose Partial weightbearing left lower extremity   T. Rachelle Hora, PA-C [place] 01/22/2017, 8:18 AM

## 2017-01-22 NOTE — Progress Notes (Signed)
Physical Therapy Treatment Patient Details Name: Amy Rocha MRN: [id number] DOB: [birth year] Today's Date: 01/22/2017    History of Present Illness Pt is a 61 y.o. female with left hip pain. The patient has history of a total of 4 surgeries on her left hip, which were all performed by Dr. Leontine Locket. The most recent surgery was performed in 2003. She explains that her first surgery was a free fibular graft for avascular necrosis, which failed. She later sustained a femur fracture, and had revision with allograft of the femur, and a total hip arthroplasty.  Pt is no s/p Left total hip revision femoral stem with polyethylene exchange with cupliner.    PT Comments    Pt presents with deficits in strength, transfers, mobility, gait, balance, and activity tolerance.  Pt limited by L hip pain and was only able to participate in gentle rolling for bed mobility training and supine therex.  Pt declined requests to attempt sitting to EOB, nursing aware.  Pt's SpO2 and HR WNL during session with no other adverse symptoms reported other than pain.  Pt will benefit from PT services in a SNF setting upon discharge to safely address above deficits for decreased caregiver assistance and eventual return to PLOF.     Follow Up Recommendations  SNF     Equipment Recommendations  None recommended by PT    Recommendations for Other Services       Precautions / Restrictions Precautions Precautions: Anterior Hip;Fall Precaution Booklet Issued: Yes (comment) Restrictions Weight Bearing Restrictions: Yes LLE Weight Bearing: Partial weight bearing LLE Partial Weight Bearing Percentage or Pounds: 50%    Mobility  Bed Mobility Overal bed mobility: Needs Assistance Bed Mobility: Rolling Rolling: Mod assist         General bed mobility comments: Gentle low amplitude rolling in PM session with pt unable to tolerate sitting at EOB secondary to L hip pain   Transfers                 General  transfer comment: Pt declined transfers this session secondary to L hip pain  Ambulation/Gait             General Gait Details: Unable   Stairs            Wheelchair Mobility    Modified Rankin (Stroke Patients Only)       Balance                                            Cognition Arousal/Alertness: Awake/alert Behavior During Therapy: WFL for tasks assessed/performed Overall Cognitive Status: Within Functional Limits for tasks assessed                                        Exercises Total Joint Exercises Ankle Circles/Pumps: AROM;AAROM;Both;10 reps;15 reps Quad Sets: Strengthening;Both;5 reps;10 reps Gluteal Sets: Strengthening;Both;5 reps;10 reps Short Arc Quad: AAROM;AROM;Both;5 reps;10 reps(Gentle low amplitude AAROM on LLE) Hip ABduction/ADduction: AAROM;AROM;Both;5 reps;10 reps(Gentle low amplitude AAROM on LLE) Straight Leg Raises: AROM;AAROM;Both;5 reps;10 reps(Gentle low amplitude AAROM on LLE) Other Exercises Other Exercises: HEP booklet review  Other Exercises: Physiological benefits of activity education provided Other Exercises: Positioning education provided to decrease L hip ER at rest    General Comments  Pertinent Vitals/Pain Pain Assessment: 0-10 Pain Score: 10-Worst pain ever Pain Location: L hip Pain Descriptors / Indicators: Aching;Operative site guarding;Sore Pain Intervention(s): Premedicated before session;Monitored during session;Limited activity within patient's tolerance;Repositioned    Home Living                      Prior Function            PT Goals (current goals can now be found in the care plan section) Progress towards PT goals: Not progressing toward goals - comment(Pt limited by L hip pain)    Frequency    BID      PT Plan Current plan remains appropriate    Co-evaluation              AM-PAC PT "6 Clicks" Daily Activity  Outcome Measure                    End of Session   Activity Tolerance: Patient limited by pain Patient left: in bed;with SCD's reapplied;with bed alarm set;with call bell/phone within reach Nurse Communication: Mobility status PT Visit Diagnosis: Other abnormalities of gait and mobility (R26.89);Muscle weakness (generalized) (M62.81)     Time: 3754-3606 PT Time Calculation (min) (ACUTE ONLY): 30 min  Charges:  $Therapeutic Exercise: 8-22 mins $Therapeutic Activity: 8-22 mins                    G Codes:       DRoyetta Asal PT, DPT 01/22/17, 4:47 PM

## 2017-01-22 NOTE — Anesthesia Postprocedure Evaluation (Signed)
Anesthesia Post Note  Patient: Amy Rocha  Procedure(s) Performed: TOTAL HIP REVISION FEMORAL STEM WITH POLYETHYLENE EXCHANGE WITH CUPLINER (Left Hip)  Patient location during evaluation: Nursing Unit Anesthesia Type: Spinal Level of consciousness: oriented and awake and alert Pain management: pain level not controlled (pt states pain medicine not received because blood pressure to low) Vital Signs Assessment: post-procedure vital signs reviewed and stable Respiratory status: spontaneous breathing and respiratory function stable Cardiovascular status: stable Postop Assessment: no headache, no backache, no apparent nausea or vomiting and spinal receding Anesthetic complications: no     Last Vitals:  Vitals:   01/22/17 0450 01/22/17 0522  BP: (!) 79/55 (!) 103/54  Pulse: 98   Resp:    Temp: 37.2 C   SpO2: 95%     Last Pain:  Vitals:   01/22/17 0450  TempSrc: Oral  PainSc:                  Brantley Fling

## 2017-01-22 NOTE — Progress Notes (Signed)
Post bolus BP is 90/50. MD aware. Patient asymptomatic. No new orders at this time.   Deri Fuelling, RN

## 2017-01-22 NOTE — Progress Notes (Signed)
Patient BP 90/65. MD notified and ordered Bolus.  Deri Fuelling, RN

## 2017-01-22 NOTE — Care Management (Signed)
RNCM consult for discharge planning. PT recommending  SNF. CSW following. Will sign off. Please re consult if needs arise.

## 2017-01-23 MED ORDER — ENOXAPARIN SODIUM 40 MG/0.4ML ~~LOC~~ SOLN: 40.0000 mg | SUBCUTANEOUS | 0 refills | Status: DC

## 2017-01-23 MED ORDER — METHOCARBAMOL 500 MG PO TABS: 500.0000 mg | ORAL_TABLET | Freq: Four times a day (QID) | ORAL | 0 refills | Status: DC | PRN

## 2017-01-23 MED ORDER — MAGNESIUM HYDROXIDE 400 MG/5ML PO SUSP: 30.0000 mL | Freq: Every day | ORAL | 0 refills | Status: DC | PRN

## 2017-01-23 MED ORDER — OXYCODONE HCL ER 40 MG PO T12A: 40.0000 mg | EXTENDED_RELEASE_TABLET | Freq: Two times a day (BID) | ORAL | 0 refills | Status: DC

## 2017-01-23 MED ORDER — OXYCODONE HCL 5 MG PO TABS: 5.0000 mg | ORAL_TABLET | ORAL | 0 refills | Status: DC | PRN

## 2017-01-23 NOTE — Progress Notes (Signed)
Physical Therapy Treatment Patient Details Name: Amy Rocha MRN: [id number] DOB: Oct 19, 1955 Today's Date: 01/23/2017    History of Present Illness Pt is a 61 y.o. female with left hip pain. The patient has history of a total of 4 surgeries on her left hip, which were all performed by Dr. Leontine Locket. The most recent surgery was performed in 2003. She explains that her first surgery was a free fibular graft for avascular necrosis, which failed. She later sustained a femur fracture, and had revision with allograft of the femur, and a total hip arthroplasty.  Pt is no s/p Left total hip revision femoral stem with polyethylene exchange with cupliner    PT Comments    Pt was able to progress treatment today to seated exercises as well as performing several STS's and a transfer from bed to chair.  Pt demonstrated more independence with bed mobility and required min assist. For initiation of STS.  PT provided education concerning body mechanics for energy efficiency and pain management during bed mobility.  PT provided assistance with RW and VC's for safe and energy efficient gait with RW.  Pt expressed and demonstrated understanding.  Pt reported a pain level of 7/10 and stated that she is feeling much better.  PT reviewed HEP with pt and discussed importance of consistency and frequency to continue to improve during the healing process.  Pt will continue to benefit from skilled PT with focus on strength, ROM, functional mobility and safe use of DME.   Follow Up Recommendations  SNF     Equipment Recommendations       Recommendations for Other Services       Precautions / Restrictions Precautions Precautions: Anterior Hip;Fall Restrictions Weight Bearing Restrictions: Yes LLE Weight Bearing: Partial weight bearing LLE Partial Weight Bearing Percentage or Pounds: 50%    Mobility  Bed Mobility Overal bed mobility: Modified Independent Bed Mobility: Rolling;Sidelying to Sit Rolling:  Modified independent (Device/Increase time) Sidelying to sit: Modified independent (Device/Increase time) Supine to sit: Min assist     General bed mobility comments: Pt was able to perform bed mobility with use of bed rail and min assist to sit upright and to scoot to EOB. PT provided VC's for hand placement and body mechanics for energy efficient transfer and pain management.  Pt demonstrated more motivation to perform tasks on her own this afternoon.  Transfers Overall transfer level: Needs assistance Equipment used: Rolling walker (2 wheeled) Transfers: Sit to/from [facility] Sit to Stand: Min assist Stand pivot transfers: Min assist       General transfer comment: Pt required min assist. for initiation of STS and VC's for WB status and management of RW during pivot transfer to chair.  Ambulation/Gait Ambulation/Gait assistance: Min assist Ambulation Distance (Feet): 5 Feet Assistive device: Rolling walker (2 wheeled) Gait Pattern/deviations: Step-to pattern;Decreased stance time - left;Decreased step length - right;Decreased weight shift to left   Gait velocity interpretation: Below normal speed for age/gender General Gait Details: Pt presented with an antalgic gait in compliance with PWB status.  Pt required min assistance for management of RW as well as VC's for hand placement.   Stairs            Wheelchair Mobility    Modified Rankin (Stroke Patients Only)       Balance Overall balance assessment: Needs assistance Sitting-balance support: Bilateral upper extremity supported     Postural control: Right lateral lean Standing balance support: Bilateral upper extremity supported   Standing  balance comment: Pt requires RW for balance.                            Cognition Arousal/Alertness: Awake/alert Behavior During Therapy: WFL for tasks assessed/performed Overall Cognitive Status: Within Functional Limits for tasks assessed                                  General Comments: Pt states that she has had some difficulty with memory and that she has found herself talking to someone who turned out to not be in the room anymore.      Exercises General Exercises - Lower Extremity Ankle Circles/Pumps: AROM;20 reps;Both;Seated Gluteal Sets: Strengthening;Both;10 reps;Seated Short Arc Quad: Strengthening;Left;10 reps;Seated Heel Slides: AAROM;Left;5 reps;Seated Hip ABduction/ADduction: AAROM;Left;Seated;20 reps(Hip abduction and adduction with pillow.)    General Comments        Pertinent Vitals/Pain Pain Score: 7  Pain Descriptors / Indicators: Sharp Pain Intervention(s): Limited activity within patient's tolerance;Premedicated before session    Home Living                      Prior Function            PT Goals (current goals can now be found in the care plan section) Acute Rehab PT Goals Patient Stated Goal: to continue to gain strength. PT Goal Formulation: With patient Time For Goal Achievement: 02/06/17 Potential to Achieve Goals: Good Progress towards PT goals: Progressing toward goals    Frequency    BID      PT Plan Current plan remains appropriate    Co-evaluation              AM-PAC PT "6 Clicks" Daily Activity  Outcome Measure  Difficulty turning over in bed (including adjusting bedclothes, sheets and blankets)?: A Little Difficulty moving from lying on back to sitting on the side of the bed? : A Little Difficulty sitting down on and standing up from a chair with arms (e.g., wheelchair, bedside commode, etc,.)?: A Little Help needed moving to and from a bed to chair (including a wheelchair)?: A Lot Help needed walking in hospital room?: A Lot Help needed climbing 3-5 steps with a railing? : A Lot 6 Click Score: 15    End of Session Equipment Utilized During Treatment: Gait belt Activity Tolerance: Patient limited by pain Patient left: in  chair;with call bell/phone within reach;with chair alarm set   PT Visit Diagnosis: Unsteadiness on feet (R26.81);Other abnormalities of gait and mobility (R26.89);Muscle weakness (generalized) (M62.81);Pain Pain - Right/Left: Left Pain - part of body: Hip     Time: 1300-1340 PT Time Calculation (min) (ACUTE ONLY): 40 min  Charges:  $Gait Training: 8-22 mins $Therapeutic Exercise: 8-22 mins $Therapeutic Activity: 8-22 mins                    G Codes:  Functional Assessment Tool Used: AM-PAC 6 Clicks Basic Mobility    Roxanne Gates, PT, DPT    Roxanne Gates 01/23/2017, 2:25 PM

## 2017-01-23 NOTE — Progress Notes (Signed)
Per [facility] admissions coordinator at [facility] authorization has been received. Plan is for patient to D/C to [facility] tomorrow pending medical clearance. Patient is aware of above.   [employer], LCSW [phone]

## 2017-01-23 NOTE — Discharge Instructions (Signed)

## 2017-01-23 NOTE — Progress Notes (Signed)
   Subjective: 2 Days Post-Op Procedure(s) (LRB): TOTAL HIP REVISION FEMORAL STEM WITH POLYETHYLENE EXCHANGE WITH CUPLINER (Left) Patient reports pain as moderate.   Patient is complaining of muscle spasms to the left anterior thigh. Spasms have improved over last 24 hrs. Denies any CP, SOB, ABD pain. We will continue with therapy today.    Objective: Vital signs in last 24 hours: Temp:  [98.4 F (36.9 C)-98.9 F (37.2 C)] 98.7 F (37.1 C) (12/06 0447) Pulse Rate:  [88-107] 102 (12/06 0830) Resp:  [16-20] 20 (12/06 0830) BP: (90-103)/(50-61) 101/60 (12/06 0830) SpO2:  [91 %-98 %] 95 % (12/06 0830)  Intake/Output from previous day: 12/05 0701 - 12/06 0700 In: 3011.7 [P.O.:240; I.V.:2221.7; IV Piggyback:550] Out: 800 [Urine:800] Intake/Output this shift: No intake/output data recorded.  Recent Labs    01/21/17 1831 01/22/17 0545  HGB 14.9 12.5   Recent Labs    01/21/17 1831 01/22/17 0545  WBC 17.7* 13.8*  RBC 4.87 4.13  HCT 45.1 37.5  PLT 290 291   Recent Labs    01/21/17 1831 01/22/17 0545  NA  --  134*  K  --  4.5  CL  --  108  CO2  --  19*  BUN  --  15  CREATININE 1.22* 1.14*  GLUCOSE  --  149*  CALCIUM  --  8.1*   No results for input(s): LABPT, INR in the last 72 hours.  EXAM General - Patient is Alert, Appropriate and Oriented Extremity - Neurovascular intact Sensation intact distally Intact pulses distally Dorsiflexion/Plantar flexion intact No cellulitis present Compartment soft , swelling in left anterior proximal thigh improving Dressing - dressing C/D/I and no drainage. Wound vac intact. NO drainage Motor Function - intact, moving foot and toes well on exam.   Past Medical History:  Diagnosis Date  . Anxiety   . Arthritis   . Asthma   . Avascular necrosis ([place])   . Blood transfusion without reported diagnosis   . Calculus of kidney   . CHF (congestive heart failure) ([place])   . Chronic pain syndrome   . Complication of anesthesia     aspirated in recovery after  hip replacement 1998  . COPD (chronic obstructive pulmonary disease) ([place])   . Depression   . GERD (gastroesophageal reflux disease)   . Heart failure ([place])   . Hyperlipidemia   . Hypertension   . IBS (irritable bowel syndrome)   . Joint pain   . Osteoporosis   . Oxygen deficiency   . Peripheral vascular disease ([place])   . PONV (postoperative nausea and vomiting)   . Urge incontinence     Assessment/Plan:   2 Days Post-Op Procedure(s) (LRB): TOTAL HIP REVISION FEMORAL STEM WITH POLYETHYLENE EXCHANGE WITH CUPLINER (Left) Active Problems:   Failed total hip arthroplasty ([place])  Estimated body mass index is 26.61 kg/m as calculated from the following:   Height as of this encounter: 5\' 4"  (1.626 m).   Weight as of this encounter: 70.3 kg (155 lb). Advance diet Up with therapy, partial weightbearing left lower extremity Needs bowel movement BP improving. Continue to monitor blood pressure.  Hold blood pressure medications.   CM to assist with discharge  DVT Prophylaxis - Lovenox, Foot Pumps and TED hose Partial weightbearing left lower extremity   T. Rachelle Hora, PA-C [place] 01/23/2017, 8:38 AM

## 2017-01-23 NOTE — Discharge Summary (Addendum)
Physician Discharge Summary  Patient ID: Amy Rocha MRN: [id number] DOB/AGE: [birth year] 63 y.o.  Admit date: 01/14/2017 Discharge date: 01/24/2017 Admission Diagnoses:  MULTIPLE JOINT PAIN   Discharge Diagnoses: Patient Active Problem List   Diagnosis Date Noted  . Failed total hip arthroplasty ([place]) 01/21/2017  . Hypertension 09/25/2016  . Oral-nasal fistula 02/06/2016  . Atherosclerotic peripheral vascular disease ([place]) 09/21/2015  . Insomnia 09/06/2015  . Complex regional pain syndrome of lower extremity 07/11/2015  . Bilateral occipital neuralgia 05/10/2015  . DDD (degenerative disc disease), lumbar 10/26/2014  . Facet syndrome, lumbar 10/26/2014  . History of total hip replacement 10/26/2014  . Avascular necrosis of bones of both hips ([place]) 10/26/2014  . Sacroiliac joint dysfunction 10/26/2014  . Calculus of kidney 09/08/2014  . Acute anxiety 09/08/2014  . GERD (gastroesophageal reflux disease) 09/08/2014  . Joint pain of leg 09/08/2014  . Urge incontinence 09/08/2014  . Hypertensive heart failure ([place]) 09/08/2014  . Avascular necrosis ([place]) 09/08/2014  . Anemia 09/08/2014  . Atherosclerosis of coronary artery 09/08/2014  . IBS (irritable bowel syndrome) 09/08/2014  . Asthma 09/08/2014  . CHF (congestive heart failure) ([place]) 09/08/2014  . Hyperlipidemia 09/08/2014  . COPD (chronic obstructive pulmonary disease) ([place]) 09/08/2014  . Depression 09/08/2014  . Hypertensive CKD (chronic kidney disease) 09/08/2014  . CKD (chronic kidney disease), stage IV ([place]) 09/08/2014  . Proteinuria 09/08/2014  . Chronic pain 09/08/2014    Past Medical History:  Diagnosis Date  . Anxiety   . Arthritis   . Asthma   . Avascular necrosis ([place])   . Blood transfusion without reported diagnosis   . Calculus of kidney   . CHF (congestive heart failure) ([place])   . Chronic pain syndrome   . Complication of anesthesia    aspirated in recovery after  hip replacement 1998  . COPD  (chronic obstructive pulmonary disease) ([place])   . Depression   . GERD (gastroesophageal reflux disease)   . Heart failure ([place])   . Hyperlipidemia   . Hypertension   . IBS (irritable bowel syndrome)   . Joint pain   . Osteoporosis   . Oxygen deficiency   . Peripheral vascular disease ([place])   . PONV (postoperative nausea and vomiting)   . Urge incontinence      Transfusion: none   Consultants (if any):   Discharged Condition: Improved  Hospital Course: Amy Rocha is an 61 y.o. female who was admitted 01/14/2017 with a diagnosis of painful left total hip and went to the operating room on 01/21/2017 and underwent the above named procedures.    Surgeries: Procedure(s): TOTAL HIP REVISION FEMORAL STEM WITH POLYETHYLENE EXCHANGE WITH CUPLINER on 01/21/2017 Patient tolerated the surgery well. Taken to PACU where she was stabilized and then transferred to the orthopedic floor.  Started on Lovenox 40mg  q 24 hrs. Foot pumps applied bilaterally at 80 mm. Heels elevated on bed with rolled towels. No evidence of DVT. Negative [place]. Physical therapy started on day #1 for gait training and transfer. OT started day #1 for ADL and assisted devices.  Patient's foley was d/c on day #1. Patient's IV  was d/c on day #2.  On post op day #3 patient was stable and ready for discharge to SNF.  Implants:  S-ROM Marathon acetabular liner size and 10 hooded liner with locking pin and screw, Medacta 28 mm metal XL head with AMIS standard femoral stem collared size 2  On 01/31/2017 remove wound vac and apply honey comb dressing  She was given perioperative antibiotics:  Anti-infectives (From admission, onward)   Start     Dose/Rate Route Frequency Ordered Stop   01/21/17 1930  ceFAZolin (ANCEF) 2 g in dextrose 5 % 100 mL IVPB     2 g 200 mL/hr over 30 Minutes Intravenous Every 6 hours 01/21/17 1811 01/22/17 1113   01/21/17 1830  valACYclovir (VALTREX) tablet 2,000 mg     2,000 mg Oral Daily  01/21/17 1758     01/21/17 1800  ceFAZolin (ANCEF) IVPB 2g/100 mL premix  Status:  Discontinued     2 g 200 mL/hr over 30 Minutes Intravenous Every 6 hours 01/21/17 1758 01/21/17 1810   01/21/17 1554  gentamicin (GARAMYCIN) 80 mg in sodium chloride irrigation 0.9 % 500 mL irrigation  Status:  Discontinued       As needed 01/21/17 1554 01/21/17 1703   01/21/17 1428  gentamicin (GARAMYCIN) 80 mg in sodium chloride irrigation 0.9 % 500 mL irrigation  Status:  Discontinued       As needed 01/21/17 1428 01/21/17 1703   01/21/17 1130  ceFAZolin (ANCEF) IVPB 2g/100 mL premix  Status:  Discontinued     2 g 200 mL/hr over 30 Minutes Intravenous  Once 01/21/17 1124 01/21/17 1757   01/21/17 1125  ceFAZolin (ANCEF) 2-4 GM/100ML-% IVPB    Comments:  Slemenda, Debra   : cabinet override      01/21/17 1125 01/21/17 1326   01/14/17 0015  ceFAZolin (ANCEF) IVPB 2g/100 mL premix     2 g 200 mL/hr over 30 Minutes Intravenous  Once 01/14/17 0001 01/21/17 1341    .  She was given sequential compression devices, early ambulation, and Lovenox for DVT prophylaxis.  She benefited maximally from the hospital stay and there were no complications.    Recent vital signs:  Vitals:   01/23/17 0447 01/23/17 0830  BP: (!) 103/55 101/60  Pulse: (!) 107 (!) 102  Resp:  20  Temp: 98.7 F (37.1 C)   SpO2: 98% 95%    Recent laboratory studies:  Lab Results  Component Value Date   HGB 12.5 01/22/2017   HGB 14.9 01/21/2017   HGB 15.3 01/03/2017   Lab Results  Component Value Date   WBC 13.8 (H) 01/22/2017   PLT 291 01/22/2017   Lab Results  Component Value Date   INR 0.89 01/03/2017   Lab Results  Component Value Date   NA 134 (L) 01/22/2017   K 4.5 01/22/2017   CL 108 01/22/2017   CO2 19 (L) 01/22/2017   BUN 15 01/22/2017   CREATININE 1.14 (H) 01/22/2017   GLUCOSE 149 (H) 01/22/2017    Discharge Medications:   Allergies as of 01/23/2017      Reactions   Fluticasone-salmeterol Swelling, Other  (See Comments)   Tongue sores   Lactose Intolerance (gi) Other (See Comments)   Bloating, GI upset, diarrhea   Tetracyclines & Related Other (See Comments)   Went in to the sun and broke out      Medication List    TAKE these medications   acetaminophen 500 MG tablet Commonly known as:  TYLENOL Take 1,000 mg every 6 (six) hours as needed by mouth for moderate pain or headache.   albuterol 108 (90 Base) MCG/ACT inhaler Commonly known as:  PROVENTIL HFA;VENTOLIN HFA Inhale 2 puffs into the lungs every 6 (six) hours as needed for wheezing or shortness of breath.   amLODipine 5 MG tablet Commonly known as:  NORVASC Take 1  tablet (5 mg total) by mouth daily. What changed:  when to take this   atorvastatin 40 MG tablet Commonly known as:  LIPITOR TAKE 1 TABLET (40 MG TOTAL) BY MOUTH DAILY. What changed:  when to take this   budesonide-formoterol 160-4.5 MCG/ACT inhaler Commonly known as:  SYMBICORT Inhale 2 puffs into the lungs 2 (two) times daily. What changed:    when to take this  reasons to take this   buPROPion 300 MG 24 hr tablet Commonly known as:  WELLBUTRIN XL TAKE 1 TABLET BY MOUTH ONCE DAILY. What changed:    how much to take  how to take this  when to take this   cromolyn 4 % ophthalmic solution Commonly known as:  OPTICROM Place 1 drop into both eyes 4 (four) times daily.   dicyclomine 20 MG tablet Commonly known as:  BENTYL Take 1 tablet (20 mg total) by mouth every 6 (six) hours.   enoxaparin 40 MG/0.4ML injection Commonly known as:  LOVENOX Inject 0.4 mLs (40 mg total) into the skin daily. Start taking on:  01/24/2017   fluticasone 50 MCG/ACT nasal spray Commonly known as:  FLONASE Place 2 sprays into both nostrils daily.   magnesium hydroxide 400 MG/5ML suspension Commonly known as:  MILK OF MAGNESIA Take 30 mLs by mouth daily as needed for mild constipation.   methocarbamol 500 MG tablet Commonly known as:  ROBAXIN Take 1 tablet (500  mg total) by mouth every 6 (six) hours as needed for muscle spasms.   omeprazole 20 MG capsule Commonly known as:  PRILOSEC TAKE 1 CAPSULE BY MOUTH DAILY.   ondansetron 4 MG disintegrating tablet Commonly known as:  ZOFRAN-ODT Take 4 mg every 8 (eight) hours as needed by mouth for nausea or vomiting.   oxyCODONE 5 MG immediate release tablet Commonly known as:  Oxy IR/ROXICODONE Take 1 tablet (5 mg total) by mouth every 3 (three) hours as needed for moderate pain ((score 4 to 6)). What changed:    medication strength  how much to take  when to take this  reasons to take this  additional instructions   oxyCODONE 40 mg 12 hr tablet Commonly known as:  OXYCONTIN Take 1 tablet (40 mg total) by mouth every 12 (twelve) hours. What changed:  You were already taking a medication with the same name, and this prescription was added. Make sure you understand how and when to take each.   TRINTELLIX 5 MG Tabs Generic drug:  vortioxetine HBr TAKE 1 TABLET BY MOUTH DAILY. What changed:    how much to take  how to take this  when to take this   valACYclovir 1000 MG tablet Commonly known as:  VALTREX Take 2 tablets (2,000 mg total) by mouth 2 (two) times daily. What changed:    when to take this  reasons to take this       Diagnostic Studies: Dg Hip Operative Unilat W Or W/o Pelvis Left  Result Date: 01/21/2017 CLINICAL DATA:  Status post LEFT hip revision. EXAM: OPERATIVE LEFT HIP (WITH PELVIS IF PERFORMED) 3 VIEWS TECHNIQUE: Fluoroscopic spot image(s) were submitted for interpretation post-operatively. FLUOROSCOPY TIME:  30 seconds COMPARISON:  LEFT hip radiograph November 27, 2016 FINDINGS: LEFT hip total arthroplasty. IMPRESSION: LEFT total hip arthroplasty. Electronically Signed   By: Elon Alas M.D.   On: 01/21/2017 17:35    Disposition: 01-Home or Self Care     Contact information for follow-up providers    Hessie Knows, MD Follow up  in 2 week(s).    Specialty:  Orthopedic Surgery Contact information: [place] [address] [phone]            Contact information for after-discharge care    Destination    [facility] Follow up.   Service:  Skilled Nursing Contact information: [place] [phone]                   Signed: Feliberto Gottron 01/23/2017, 12:09 PM

## 2017-01-23 NOTE — Progress Notes (Signed)
Physical Therapy Treatment Patient Details Name: Amy Rocha MRN: [id number] DOB: [birth year] Today's Date: 01/23/2017    History of Present Illness Pt is a 61 y.o. female with left hip pain. The patient has history of a total of 4 surgeries on her left hip, which were all performed by Dr. Leontine Locket. The most recent surgery was performed in 2003. She explains that her first surgery was a free fibular graft for avascular necrosis, which failed. She later sustained a femur fracture, and had revision with allograft of the femur, and a total hip arthroplasty.  Pt is no s/p Left total hip revision femoral stem with polyethylene exchange with cupliner.    PT Comments    Pt was able to progress bed mobility during treatment as well as therapeutic exercise to seated exercises.  Pt demonstrated more confidence with STS with VC's for hand placement and min assist for initiation of movement.  Pt required manual assistance to initiate hip abduction in supine but was able to perform seated hip abduction by gradually walking foot to the side and then back into adduction.  Pt is hyperaware of pain level, stating 8/10 pain at the lowest, and repeated that she thought, "she might die" if she tried movements such as STS and transfer to chair.  Pt will continue to benefit from skilled PT with focus on strength, ROM, functional mobility, pain management and safe use of DME.  Follow Up Recommendations  SNF     Equipment Recommendations       Recommendations for Other Services       Precautions / Restrictions Precautions Precautions: Anterior Hip;Fall Restrictions Weight Bearing Restrictions: Yes LLE Weight Bearing: Partial weight bearing    Mobility  Bed Mobility Overal bed mobility: Needs Assistance Bed Mobility: Supine to Sit;Sit to Supine Rolling: Modified independent (Device/Increase time)   Supine to sit: Min assist Sit to supine: Min assist   General bed mobility comments: Pt required  assistance with bringing L LE over EOB with sup to sit and sit to sup but was able to perform scooting at EOB, scooting up in bed and rolling with use of bed rail.  Transfers Overall transfer level: Needs assistance Equipment used: Rolling walker (2 wheeled) Transfers: Sit to/from Stand Sit to Stand: Min assist         General transfer comment: Pt required min assist to initiate sit to stand but was able to complete remainder of transfer with use of RW only.  PT reminded pt of PWB status and educated concerning use of RW and proper hand placement.  Pt expressed and demonstrated understanding.  Ambulation/Gait Ambulation/Gait assistance: (Did not perform.)               Stairs            Wheelchair Mobility    Modified Rankin (Stroke Patients Only)       Balance Overall balance assessment: Needs assistance Sitting-balance support: Feet supported;Single extremity supported   Sitting balance - Comments: Pt is able to sit with R UE support due to leaning away from L side. Postural control: Right lateral lean Standing balance support: Bilateral upper extremity supported   Standing balance comment: Close SBA for balance once pt is standing.  Pt was able to monitor WB status and stated that she is surprised at how much better she is moving today.  Cognition Arousal/Alertness: Awake/alert Behavior During Therapy: WFL for tasks assessed/performed Overall Cognitive Status: Within Functional Limits for tasks assessed                                        Exercises Total Joint Exercises Quad Sets: Left;Strengthening;10 reps;Supine Towel Squeeze: Strengthening;Left;10 reps;Supine Heel Slides: AAROM;5 reps;Left;Supine Hip ABduction/ADduction: AAROM;Left;Supine;10 reps Long Arc Quad: Strengthening;Left;10 reps;Seated Other Exercises Other Exercises: Seated hip abduction: L: x8 walking foot gradually to side and then  adducting using the same method. Other Exercises: seated heel raises with VC's for abdominal contraction and upright posture: BLE x20 Other Exercises: STS: x1 with min assist for initiation and VC's for hand placement on RW.    General Comments        Pertinent Vitals/Pain Pain Assessment: 0-10 Pain Score: 8  Pain Location: L hip Pain Descriptors / Indicators: Stabbing    Home Living                      Prior Function            PT Goals (current goals can now be found in the care plan section) Acute Rehab PT Goals Patient Stated Goal: to be able to walk without pain. PT Goal Formulation: With patient Time For Goal Achievement: 02/06/17 Potential to Achieve Goals: Fair Progress towards PT goals: Progressing toward goals    Frequency    BID      PT Plan Current plan remains appropriate    Co-evaluation              AM-PAC PT "6 Clicks" Daily Activity  Outcome Measure  Difficulty turning over in bed (including adjusting bedclothes, sheets and blankets)?: A Little Difficulty moving from lying on back to sitting on the side of the bed? : A Lot Difficulty sitting down on and standing up from a chair with arms (e.g., wheelchair, bedside commode, etc,.)?: A Little Help needed moving to and from a bed to chair (including a wheelchair)?: A Lot Help needed walking in hospital room?: A Lot Help needed climbing 3-5 steps with a railing? : A Lot 6 Click Score: 14    End of Session Equipment Utilized During Treatment: Gait belt Activity Tolerance: Patient limited by pain Patient left: in bed;with call bell/phone within reach;with bed alarm set   PT Visit Diagnosis: Unsteadiness on feet (R26.81);Muscle weakness (generalized) (M62.81);Pain Pain - Right/Left: Left Pain - part of body: Hip     Time: 0855-0920 PT Time Calculation (min) (ACUTE ONLY): 25 min  Charges:  $Therapeutic Exercise: 8-22 mins $Therapeutic Activity: 8-22 mins                    G  Codes:  Functional Assessment Tool Used: AM-PAC 6 Clicks Basic Mobility    Roxanne Gates, PT, DPT    Roxanne Gates 01/23/2017, 9:40 AM

## 2017-01-24 DIAGNOSIS — M62838 Other muscle spasm: Secondary | ICD-10-CM | POA: Diagnosis not present

## 2017-01-24 DIAGNOSIS — K589 Irritable bowel syndrome without diarrhea: Secondary | ICD-10-CM | POA: Diagnosis not present

## 2017-01-24 DIAGNOSIS — M5136 Other intervertebral disc degeneration, lumbar region: Secondary | ICD-10-CM | POA: Diagnosis not present

## 2017-01-24 DIAGNOSIS — G4700 Insomnia, unspecified: Secondary | ICD-10-CM | POA: Diagnosis not present

## 2017-01-24 DIAGNOSIS — I739 Peripheral vascular disease, unspecified: Secondary | ICD-10-CM | POA: Diagnosis not present

## 2017-01-24 DIAGNOSIS — F329 Major depressive disorder, single episode, unspecified: Secondary | ICD-10-CM | POA: Diagnosis not present

## 2017-01-24 DIAGNOSIS — M25552 Pain in left hip: Secondary | ICD-10-CM | POA: Diagnosis not present

## 2017-01-24 DIAGNOSIS — N184 Chronic kidney disease, stage 4 (severe): Secondary | ICD-10-CM | POA: Diagnosis not present

## 2017-01-24 DIAGNOSIS — Z7401 Bed confinement status: Secondary | ICD-10-CM | POA: Diagnosis not present

## 2017-01-24 DIAGNOSIS — N189 Chronic kidney disease, unspecified: Secondary | ICD-10-CM | POA: Diagnosis not present

## 2017-01-24 DIAGNOSIS — F419 Anxiety disorder, unspecified: Secondary | ICD-10-CM | POA: Diagnosis not present

## 2017-01-24 DIAGNOSIS — Y792 Prosthetic and other implants, materials and accessory orthopedic devices associated with adverse incidents: Secondary | ICD-10-CM | POA: Diagnosis not present

## 2017-01-24 DIAGNOSIS — I10 Essential (primary) hypertension: Secondary | ICD-10-CM | POA: Diagnosis not present

## 2017-01-24 DIAGNOSIS — M5386 Other specified dorsopathies, lumbar region: Secondary | ICD-10-CM | POA: Diagnosis not present

## 2017-01-24 DIAGNOSIS — Z23 Encounter for immunization: Secondary | ICD-10-CM | POA: Diagnosis not present

## 2017-01-24 DIAGNOSIS — I130 Hypertensive heart and chronic kidney disease with heart failure and stage 1 through stage 4 chronic kidney disease, or unspecified chronic kidney disease: Secondary | ICD-10-CM | POA: Diagnosis not present

## 2017-01-24 DIAGNOSIS — I509 Heart failure, unspecified: Secondary | ICD-10-CM | POA: Diagnosis not present

## 2017-01-24 DIAGNOSIS — K219 Gastro-esophageal reflux disease without esophagitis: Secondary | ICD-10-CM | POA: Diagnosis not present

## 2017-01-24 DIAGNOSIS — M2550 Pain in unspecified joint: Secondary | ICD-10-CM | POA: Diagnosis not present

## 2017-01-24 DIAGNOSIS — J449 Chronic obstructive pulmonary disease, unspecified: Secondary | ICD-10-CM | POA: Diagnosis not present

## 2017-01-24 DIAGNOSIS — E785 Hyperlipidemia, unspecified: Secondary | ICD-10-CM | POA: Diagnosis not present

## 2017-01-24 DIAGNOSIS — M810 Age-related osteoporosis without current pathological fracture: Secondary | ICD-10-CM | POA: Diagnosis not present

## 2017-01-24 DIAGNOSIS — T8484XD Pain due to internal orthopedic prosthetic devices, implants and grafts, subsequent encounter: Secondary | ICD-10-CM | POA: Diagnosis not present

## 2017-01-24 DIAGNOSIS — I5032 Chronic diastolic (congestive) heart failure: Secondary | ICD-10-CM | POA: Diagnosis not present

## 2017-01-24 DIAGNOSIS — R52 Pain, unspecified: Secondary | ICD-10-CM | POA: Diagnosis not present

## 2017-01-24 DIAGNOSIS — M5481 Occipital neuralgia: Secondary | ICD-10-CM | POA: Diagnosis not present

## 2017-01-24 DIAGNOSIS — Z96642 Presence of left artificial hip joint: Secondary | ICD-10-CM | POA: Diagnosis not present

## 2017-01-24 NOTE — Progress Notes (Signed)
Physical Therapy Treatment Patient Details Name: Amy Rocha MRN: [id number] DOB: 28-Mar-1955 Today's Date: 01/24/2017    History of Present Illness Pt is a 61 y.o. female with left hip pain. The patient has history of a total of 4 surgeries on her left hip, which were all performed by Dr. Leontine Locket. The most recent surgery was performed in 2003. She explains that her first surgery was a free fibular graft for avascular necrosis, which failed. She later sustained a femur fracture, and had revision with allograft of the femur, and a total hip arthroplasty.  Pt is no s/p Left total hip revision femoral stem with polyethylene exchange with cupliner    PT Comments    Pt progressing gradually with ambulation distance (8 feet with RW CGA) today; limited d/t L hip pain 7-8/10 during session (nursing brought pt pain meds during session).  Pt also requiring assist to initiate stand with RW.  Plan to discharge to STR today (continue to focus on strengthening, increasing ambulation distance, and decreasing assist levels with functional mobility).    Follow Up Recommendations  SNF     Equipment Recommendations  Rolling walker with 5" wheels    Recommendations for Other Services       Precautions / Restrictions Precautions Precautions: Anterior Hip;Fall Precaution Booklet Issued: Yes (comment) Restrictions Weight Bearing Restrictions: Yes LLE Weight Bearing: Partial weight bearing LLE Partial Weight Bearing Percentage or Pounds: 50%    Mobility  Bed Mobility Overal bed mobility: Needs Assistance       Supine to sit: Min assist Sit to supine: Min assist   General bed mobility comments: assist for L LE supine to/from sit; use of bed rail; increased effort and time to perform  Transfers Overall transfer level: Needs assistance Equipment used: Rolling walker (2 wheeled) Transfers: Sit to/from [facility] Sit to Stand: Min assist Stand pivot transfers: Min assist(bed  to/from [facility])       General transfer comment: assist to initiate stand with use of RW  Ambulation/Gait Ambulation/Gait assistance: Min guard Ambulation Distance (Feet): 8 Feet Assistive device: Rolling walker (2 wheeled) Gait Pattern/deviations: Step-to pattern;Decreased stance time - left;Decreased step length - right;Decreased weight shift to left Gait velocity: decreased   General Gait Details: intermittent vc's for foot flat instead off [place] through forefoot/toes and to increase UE support through RW to maintain PWB'ing status; limited d/t L hip pain   Stairs            Wheelchair Mobility    Modified Rankin (Stroke Patients Only)       Balance Overall balance assessment: Needs assistance Sitting-balance support: No upper extremity supported Sitting balance-Leahy Scale: Good Sitting balance - Comments: sitting reaching within BOS steady   Standing balance support: Bilateral upper extremity supported Standing balance-Leahy Scale: Poor Standing balance comment: requires UE support on RW for balance                            Cognition Arousal/Alertness: Awake/alert Behavior During Therapy: WFL for tasks assessed/performed Overall Cognitive Status: Within Functional Limits for tasks assessed                                        Exercises Total Joint Exercises Ankle Circles/Pumps: AROM;Strengthening;Both;10 reps;Supine Quad Sets: AROM;Strengthening;Both;10 reps;Supine Short Arc Quad: AAROM;Strengthening;Left;10 reps;Supine Heel Slides: AAROM;Strengthening;Left;10 reps;Supine Hip ABduction/ADduction: AAROM;Strengthening;Left;10 reps;Supine  General Comments General comments (skin integrity, edema, etc.): Pt resting in bed with wound vac in place upon PT entry.  Pt agreeable to PT session.      Pertinent Vitals/Pain Pain Assessment: 0-10 Pain Score: 8  Pain Location: L hip Pain Descriptors / Indicators:  Sore;Aching;Sharp Pain Intervention(s): Limited activity within patient's tolerance;Monitored during session;Repositioned;Patient requesting pain meds-RN notified;RN gave pain meds during session    Home Living                      Prior Function            PT Goals (current goals can now be found in the care plan section) Acute Rehab PT Goals Patient Stated Goal: to continue to gain strength. PT Goal Formulation: With patient Time For Goal Achievement: 02/06/17 Potential to Achieve Goals: Good Progress towards PT goals: Progressing toward goals    Frequency    BID      PT Plan Current plan remains appropriate    Co-evaluation              AM-PAC PT "6 Clicks" Daily Activity  Outcome Measure  Difficulty turning over in bed (including adjusting bedclothes, sheets and blankets)?: A Little Difficulty moving from lying on back to sitting on the side of the bed? : A Little Difficulty sitting down on and standing up from a chair with arms (e.g., wheelchair, bedside commode, etc,.)?: Unable Help needed moving to and from a bed to chair (including a wheelchair)?: A Little Help needed walking in hospital room?: A Little Help needed climbing 3-5 steps with a railing? : A Lot 6 Click Score: 15    End of Session Equipment Utilized During Treatment: Gait belt Activity Tolerance: Patient limited by pain Patient left: in bed;with call bell/phone within reach;with bed alarm set(B heels elevated via pillow; pt declined SCD's) Nurse Communication: Mobility status;Patient requests pain meds;Precautions;Weight bearing status PT Visit Diagnosis: Unsteadiness on feet (R26.81);Other abnormalities of gait and mobility (R26.89);Muscle weakness (generalized) (M62.81);Pain Pain - Right/Left: Left Pain - part of body: Hip     Time: 0915-1000 PT Time Calculation (min) (ACUTE ONLY): 45 min  Charges:  $Gait Training: 8-22 mins $Therapeutic Exercise: 8-22 mins $Therapeutic  Activity: 8-22 mins                    G CodesLeitha Bleak, PT 01/24/17, 10:16 AM [phone]

## 2017-01-24 NOTE — Plan of Care (Signed)
  Progressing Spiritual Needs Ability to function at adequate level 01/24/2017 0555 - Progressing by Rudransh Bellanca, Lucille Passy, RN Education: Knowledge of General Education information will improve 01/24/2017 [phone] - Progressing by Evelyne Makepeace, Lucille Passy, RN Health Behavior/Discharge Planning: Ability to manage health-related needs will improve 01/24/2017 0555 - Progressing by Breda Bond, Lucille Passy, RN Clinical Measurements: Ability to maintain clinical measurements within normal limits will improve 01/24/2017 0555 - Progressing by Charlette Hennings, Lucille Passy, RN Will remain free from infection 01/24/2017 0555 - Progressing by Armour Villanueva, Lucille Passy, RN Diagnostic test results will improve 01/24/2017 0555 - Progressing by Hayward Rylander, Lucille Passy, RN Respiratory complications will improve 01/24/2017 0555 - Progressing by Bryna Colander, RN Cardiovascular complication will be avoided 01/24/2017 0555 - Progressing by Bryna Colander, RN Activity: Risk for activity intolerance will decrease 01/24/2017 0555 - Progressing by Bryna Colander, RN Nutrition: Adequate nutrition will be maintained 01/24/2017 0555 - Progressing by Bryna Colander, RN Coping: Level of anxiety will decrease 01/24/2017 0555 - Progressing by Bryna Colander, RN Elimination: Will not experience complications related to bowel motility 01/24/2017 0555 - Progressing by Bryna Colander, RN Will not experience complications related to urinary retention 01/24/2017 0555 - Progressing by Dianara Smullen, Lucille Passy, RN Pain Managment: General experience of comfort will improve 01/24/2017 0555 - Progressing by Melita Villalona, Lucille Passy, RN Safety: Ability to remain free from injury will improve 01/24/2017 0555 - Progressing by Darrly Loberg, Lucille Passy, RN Skin Integrity: Risk for impaired skin integrity will decrease 01/24/2017 0555 - Progressing by Bryna Colander, RN Education: Knowledge of the prescribed therapeutic regimen will  improve 01/24/2017 0555 - Progressing by Maisey Deandrade, Lucille Passy, RN Understanding of discharge needs will improve 01/24/2017 0555 - Progressing by Nyeemah Jennette, Lucille Passy, RN Activity: Ability to avoid complications of mobility impairment will improve 01/24/2017 0555 - Progressing by Bryna Colander, RN Ability to tolerate increased activity will improve 01/24/2017 0555 - Progressing by Bryna Colander, RN Clinical Measurements: Postoperative complications will be avoided or minimized 01/24/2017 0555 - Progressing by Bryna Colander, RN Pain Management: Pain level will decrease with appropriate interventions 01/24/2017 0555 - Progressing by Bryna Colander, RN Skin Integrity: Signs of wound healing will improve 01/24/2017 0555 - Progressing by Etter Royall, Lucille Passy, RN

## 2017-01-24 NOTE — Care Management Important Message (Signed)
Important Message  Patient Details  Name: Amy Rocha MRN: [id number] Date of Birth: June 05, 1955   Medicare Important Message Given:  Yes    Jolly Mango, RN 01/24/2017, 8:45 AM

## 2017-01-24 NOTE — Progress Notes (Signed)
   Subjective: 3 Days Post-Op Procedure(s) (LRB): TOTAL HIP REVISION FEMORAL STEM WITH POLYETHYLENE EXCHANGE WITH CUPLINER (Left) Patient reports pain as 6/10. Pain is improving. Denies any CP, SOB, ABD pain. We will continue with therapy today.    Objective: Vital signs in last 24 hours: Temp:  [98.6 F (37 C)-98.8 F (37.1 C)] 98.6 F (37 C) (12/06 1923) Pulse Rate:  [101-109] 109 (12/06 1923) Resp:  [18-20] 18 (12/06 1923) BP: (94-106)/(53-64) 106/64 (12/06 1923) SpO2:  [95 %-97 %] 97 % (12/06 1923)  Intake/Output from previous day: 12/06 0701 - 12/07 0700 In: 720 [P.O.:720] Out: 800 [Urine:800] Intake/Output this shift: Total I/O In: 480 [P.O.:480] Out: 400 [Urine:400]  Recent Labs    01/21/17 1831 01/22/17 0545  HGB 14.9 12.5   Recent Labs    01/21/17 1831 01/22/17 0545  WBC 17.7* 13.8*  RBC 4.87 4.13  HCT 45.1 37.5  PLT 290 291   Recent Labs    01/21/17 1831 01/22/17 0545  NA  --  134*  K  --  4.5  CL  --  108  CO2  --  19*  BUN  --  15  CREATININE 1.22* 1.14*  GLUCOSE  --  149*  CALCIUM  --  8.1*   No results for input(s): LABPT, INR in the last 72 hours.  EXAM General - Patient is Alert, Appropriate and Oriented Extremity - Neurovascular intact Sensation intact distally Intact pulses distally Dorsiflexion/Plantar flexion intact No cellulitis present Compartment soft , mild swelling in left anterior proximal thigh - homans sign Dressing - dressing C/D/I and no drainage. Wound vac intact. NO drainage Motor Function - intact, moving foot and toes well on exam.   Past Medical History:  Diagnosis Date  . Anxiety   . Arthritis   . Asthma   . Avascular necrosis ([place])   . Blood transfusion without reported diagnosis   . Calculus of kidney   . CHF (congestive heart failure) ([place])   . Chronic pain syndrome   . Complication of anesthesia    aspirated in recovery after  hip replacement 1998  . COPD (chronic obstructive pulmonary disease)  ([place])   . Depression   . GERD (gastroesophageal reflux disease)   . Heart failure ([place])   . Hyperlipidemia   . Hypertension   . IBS (irritable bowel syndrome)   . Joint pain   . Osteoporosis   . Oxygen deficiency   . Peripheral vascular disease ([place])   . PONV (postoperative nausea and vomiting)   . Urge incontinence     Assessment/Plan:   3 Days Post-Op Procedure(s) (LRB): TOTAL HIP REVISION FEMORAL STEM WITH POLYETHYLENE EXCHANGE WITH CUPLINER (Left) Active Problems:   Failed total hip arthroplasty ([place])  Estimated body mass index is 26.61 kg/m as calculated from the following:   Height as of this encounter: 5\' 4"  (1.626 m).   Weight as of this encounter: 70.3 kg (155 lb). Advance diet Up with therapy, partial weightbearing left lower extremity Needs bowel movement Discharge to SNF today pending BM Follow up with [facility] in 2 weeks Remove wound vac and apply honeycomb dressing on 01/31/17 TED Hose BLE, remove at night  DVT Prophylaxis - Lovenox, Foot Pumps and TED hose Partial weightbearing left lower extremity   T. Rachelle Hora, PA-C [place] 01/24/2017, 6:44 AM

## 2017-01-24 NOTE — Clinical Social Work Placement (Addendum)
   CLINICAL SOCIAL WORK PLACEMENT  NOTE  Date:  01/24/2017  Patient Details  Name: Amy Rocha MRN: [id number] Date of Birth: [birth year]  Clinical Social Work is seeking post-discharge placement for this patient at the [place] level of care (*CSW will initial, date and re-position this form in  chart as items are completed):  Yes   Patient/family provided with [place] [facility] Work Department's list of facilities offering this level of care within the geographic area requested by the patient (or if unable, by the patient's family).  Yes   Patient/family informed of their freedom to choose among providers that offer the needed level of care, that participate in Medicare, Medicaid or managed care program needed by the patient, have an available bed and are willing to accept the patient.  Yes   Patient/family informed of [facility]'s ownership interest in [facility] and [facility], as well as of the fact that they are under no obligation to receive care at these facilities.  PASRR submitted to EDS on 01/22/17     PASRR number received on 01/22/17     Existing PASRR number confirmed on       FL2 transmitted to all facilities in geographic area requested by pt/family on 01/22/17     FL2 transmitted to all facilities within larger geographic area on       Patient informed that his/her managed care company has contracts with or will negotiate with certain facilities, including the following:        Yes   Patient/family informed of bed offers received.  Patient chooses bed at [facility] )     Physician recommends and patient chooses bed at      Patient to be transferred to [facility] ) on 01/24/17.  Patient to be transferred to facility by [facility] )     Patient family notified on 01/24/17 of transfer.  Name of family member notified:  (CSW left patient's husband Jori Moll a Advertising account executive, CSW left patient's sister Blanch Media a  Advertising account executive. ) Patient's sister Blanch Media called CSW and is in agreement with plan.   PHYSICIAN       Additional Comment:    _______________________________________________ Seibert Keeter, Veronia Beets, LCSW 01/24/2017, 8:27 AM

## 2017-01-24 NOTE — Progress Notes (Signed)
Report called to [facility]. Report given to [facility]. Printed prescriptions in transfer package along with AVS.  EMS called for transportation

## 2017-01-24 NOTE — Progress Notes (Signed)
Patient is medically stable for D/C to [facility] today. Per [facility] admissions coordinator at [facility] patient can come to room 412 today and [facility] authorization has been received. RN will call report and arrange EMS for transport. Clinical Education officer, museum (CSW) sent D/C orders to [facility] via [place]. Patient is aware of above. CSW contacted patient's sister Blanch Media and made her aware of above. CSW left patient's husband Jori Moll a Advertising account executive. Please reconsult if future social work needs arise. CSW signing off.   [employer], LCSW [phone]

## 2017-01-27 DIAGNOSIS — M2550 Pain in unspecified joint: Secondary | ICD-10-CM | POA: Diagnosis not present

## 2017-01-27 DIAGNOSIS — J449 Chronic obstructive pulmonary disease, unspecified: Secondary | ICD-10-CM | POA: Diagnosis not present

## 2017-01-27 DIAGNOSIS — N184 Chronic kidney disease, stage 4 (severe): Secondary | ICD-10-CM | POA: Diagnosis not present

## 2017-01-27 DIAGNOSIS — I5032 Chronic diastolic (congestive) heart failure: Secondary | ICD-10-CM | POA: Diagnosis not present

## 2017-01-27 DIAGNOSIS — I10 Essential (primary) hypertension: Secondary | ICD-10-CM | POA: Diagnosis not present

## 2017-01-29 DIAGNOSIS — R52 Pain, unspecified: Secondary | ICD-10-CM | POA: Diagnosis not present

## 2017-01-29 DIAGNOSIS — I10 Essential (primary) hypertension: Secondary | ICD-10-CM | POA: Diagnosis not present

## 2017-01-29 DIAGNOSIS — Z96642 Presence of left artificial hip joint: Secondary | ICD-10-CM | POA: Diagnosis not present

## 2017-01-29 DIAGNOSIS — F329 Major depressive disorder, single episode, unspecified: Secondary | ICD-10-CM | POA: Diagnosis not present

## 2017-01-31 DIAGNOSIS — R52 Pain, unspecified: Secondary | ICD-10-CM | POA: Diagnosis not present

## 2017-01-31 DIAGNOSIS — F329 Major depressive disorder, single episode, unspecified: Secondary | ICD-10-CM | POA: Diagnosis not present

## 2017-01-31 DIAGNOSIS — I10 Essential (primary) hypertension: Secondary | ICD-10-CM | POA: Diagnosis not present

## 2017-01-31 DIAGNOSIS — Z96642 Presence of left artificial hip joint: Secondary | ICD-10-CM | POA: Diagnosis not present

## 2017-02-24 DIAGNOSIS — Z5181 Encounter for therapeutic drug level monitoring: Secondary | ICD-10-CM | POA: Diagnosis not present

## 2017-02-24 DIAGNOSIS — M5136 Other intervertebral disc degeneration, lumbar region: Secondary | ICD-10-CM | POA: Diagnosis not present

## 2017-02-24 DIAGNOSIS — M545 Low back pain: Secondary | ICD-10-CM | POA: Diagnosis not present

## 2017-02-24 DIAGNOSIS — M9904 Segmental and somatic dysfunction of sacral region: Secondary | ICD-10-CM | POA: Diagnosis not present

## 2017-03-03 ENCOUNTER — Other Ambulatory Visit: Payer: Self-pay | Admitting: Unknown Physician Specialty

## 2017-03-03 DIAGNOSIS — M9904 Segmental and somatic dysfunction of sacral region: Secondary | ICD-10-CM | POA: Diagnosis not present

## 2017-03-03 DIAGNOSIS — M545 Low back pain: Secondary | ICD-10-CM | POA: Diagnosis not present

## 2017-03-03 DIAGNOSIS — M5136 Other intervertebral disc degeneration, lumbar region: Secondary | ICD-10-CM | POA: Diagnosis not present

## 2017-03-03 DIAGNOSIS — Z5181 Encounter for therapeutic drug level monitoring: Secondary | ICD-10-CM | POA: Diagnosis not present

## 2017-03-10 DIAGNOSIS — Z96642 Presence of left artificial hip joint: Secondary | ICD-10-CM | POA: Diagnosis not present

## 2017-03-11 ENCOUNTER — Other Ambulatory Visit: Payer: Self-pay | Admitting: Family Medicine

## 2017-03-19 DIAGNOSIS — R262 Difficulty in walking, not elsewhere classified: Secondary | ICD-10-CM | POA: Diagnosis not present

## 2017-03-19 DIAGNOSIS — M25552 Pain in left hip: Secondary | ICD-10-CM | POA: Diagnosis not present

## 2017-03-19 DIAGNOSIS — M6281 Muscle weakness (generalized): Secondary | ICD-10-CM | POA: Diagnosis not present

## 2017-03-19 DIAGNOSIS — Z96649 Presence of unspecified artificial hip joint: Secondary | ICD-10-CM | POA: Diagnosis not present

## 2017-03-24 DIAGNOSIS — M9904 Segmental and somatic dysfunction of sacral region: Secondary | ICD-10-CM | POA: Diagnosis not present

## 2017-03-24 DIAGNOSIS — M545 Low back pain: Secondary | ICD-10-CM | POA: Diagnosis not present

## 2017-03-24 DIAGNOSIS — M5136 Other intervertebral disc degeneration, lumbar region: Secondary | ICD-10-CM | POA: Diagnosis not present

## 2017-03-24 DIAGNOSIS — Z5181 Encounter for therapeutic drug level monitoring: Secondary | ICD-10-CM | POA: Diagnosis not present

## 2017-03-26 DIAGNOSIS — Z96649 Presence of unspecified artificial hip joint: Secondary | ICD-10-CM | POA: Diagnosis not present

## 2017-03-26 DIAGNOSIS — M25552 Pain in left hip: Secondary | ICD-10-CM | POA: Diagnosis not present

## 2017-03-28 ENCOUNTER — Encounter: Payer: Self-pay | Admitting: Family Medicine

## 2017-03-28 ENCOUNTER — Ambulatory Visit (INDEPENDENT_AMBULATORY_CARE_PROVIDER_SITE_OTHER): Payer: Medicare HMO | Admitting: Family Medicine

## 2017-03-28 VITALS — BP 109/68 | HR 97 | Temp 97.9°F | Wt 153.8 lb

## 2017-03-28 DIAGNOSIS — I10 Essential (primary) hypertension: Secondary | ICD-10-CM

## 2017-03-28 DIAGNOSIS — E782 Mixed hyperlipidemia: Secondary | ICD-10-CM

## 2017-03-28 DIAGNOSIS — F331 Major depressive disorder, recurrent, moderate: Secondary | ICD-10-CM | POA: Diagnosis not present

## 2017-03-28 DIAGNOSIS — I25119 Atherosclerotic heart disease of native coronary artery with unspecified angina pectoris: Secondary | ICD-10-CM | POA: Diagnosis not present

## 2017-03-28 MED ORDER — AMLODIPINE BESYLATE 5 MG PO TABS: 5.0000 mg | ORAL_TABLET | Freq: Every day | ORAL | 1 refills | Status: DC

## 2017-03-28 MED ORDER — VALACYCLOVIR HCL 1 G PO TABS: 2000.0000 mg | ORAL_TABLET | Freq: Two times a day (BID) | ORAL | 11 refills | Status: DC | PRN

## 2017-03-28 MED ORDER — ATORVASTATIN CALCIUM 40 MG PO TABS: 40.0000 mg | ORAL_TABLET | Freq: Every day | ORAL | 3 refills | Status: DC

## 2017-03-28 MED ORDER — FLUTICASONE PROPIONATE 50 MCG/ACT NA SUSP: 2.0000 | Freq: Every day | NASAL | 6 refills | Status: DC

## 2017-03-28 MED ORDER — VORTIOXETINE HBR 20 MG PO TABS: 20.0000 mg | ORAL_TABLET | Freq: Every day | ORAL | 6 refills | Status: DC

## 2017-03-28 NOTE — Patient Instructions (Signed)
[facility] - [phone]    

## 2017-03-28 NOTE — Progress Notes (Signed)
BP 109/68 (BP Location: Left Arm, Patient Position: Sitting, Cuff Size: Normal)   Pulse 97   Temp 97.9 F (36.6 C) (Oral)   Wt 153 lb 12.8 oz (69.8 kg)   LMP 09/13/1975 (Approximate)   SpO2 97%   BMI 26.40 kg/m    Subjective:    Patient ID: Amy Rocha, female    DOB: 07-Sep-1955, 62 y.o.   MRN: [id number]  HPI: Amy Rocha is a 63 y.o. female  Chief Complaint  Patient presents with  . Hypertension    No complaints  . Hyperlipidemia   Pt here today for 6 month f/u.    BPs continue to do well on amlodipine, taking faithfully without side effects. Continues to do well on atorvastatin. Denies CP, SOB, claudication, dizziness. Has been unable to be as active as usual lately given recent left hip replacement but still working on having a good diet.   Wanting to discuss changing her depression regimen as she doesn't feel like the wellbutrin/trintellix combination has been enough since her surgery. Struggling with the inactivity and pain with rehab, cries a lot, feels hopeless. Denies SI/HI.   Past Medical History:  Diagnosis Date  . Anxiety   . Arthritis   . Asthma   . Avascular necrosis ([place])   . Blood transfusion without reported diagnosis   . Calculus of kidney   . CHF (congestive heart failure) ([place])   . Chronic pain syndrome   . Complication of anesthesia    aspirated in recovery after  hip replacement 1998  . COPD (chronic obstructive pulmonary disease) ([place])   . Depression   . GERD (gastroesophageal reflux disease)   . Heart failure ([place])   . Hyperlipidemia   . Hypertension   . IBS (irritable bowel syndrome)   . Joint pain   . Osteoporosis   . Oxygen deficiency   . Peripheral vascular disease ([place])   . PONV (postoperative nausea and vomiting)   . Urge incontinence    Social History   Socioeconomic History  . Marital status: Married    Spouse name: Not on file  . Number of children: Not on file  . Years of education: Not on file  . Highest  education level: Not on file  Social Needs  . Financial resource strain: Not on file  . Food insecurity - worry: Not on file  . Food insecurity - inability: Not on file  . Transportation needs - medical: Not on file  . Transportation needs - non-medical: Not on file  Occupational History  . Not on file  Tobacco Use  . Smoking status: Former Smoker    Last attempt to quit: 02/19/1996    Years since quitting: 21.1  . Smokeless tobacco: Never Used  Substance and Sexual Activity  . Alcohol use: No    Alcohol/week: 0.0 oz  . Drug use: No  . Sexual activity: Yes  Other Topics Concern  . Not on file  Social History Narrative  . Not on file    Relevant past medical, surgical, family and social history reviewed and updated as indicated. Interim medical history since our last visit reviewed. Allergies and medications reviewed and updated.  Review of Systems  Per HPI unless specifically indicated above     Objective:    BP 109/68 (BP Location: Left Arm, Patient Position: Sitting, Cuff Size: Normal)   Pulse 97   Temp 97.9 F (36.6 C) (Oral)   Wt 153 lb 12.8 oz (69.8 kg)  LMP 09/13/1975 (Approximate)   SpO2 97%   BMI 26.40 kg/m   Wt Readings from Last 3 Encounters:  03/28/17 153 lb 12.8 oz (69.8 kg)  01/21/17 155 lb (70.3 kg)  01/03/17 159 lb (72.1 kg)    Physical Exam  Constitutional: She is oriented to person, place, and time. She appears well-developed and well-nourished. No distress.  HENT:  Head: Atraumatic.  Eyes: Conjunctivae are normal. Pupils are equal, round, and reactive to light.  Neck: Normal range of motion. Neck supple.  Cardiovascular: Normal rate and normal heart sounds.  Pulmonary/Chest: Effort normal and breath sounds normal. No respiratory distress.  Musculoskeletal:  Antalgic ROM with left hip limp s/p left hip replacement  Neurological: She is alert and oriented to person, place, and time.  Skin: Skin is warm and dry. No erythema.  Psychiatric: She  has a normal mood and affect. Her behavior is normal.  Nursing note and vitals reviewed.   Results for orders placed or performed in visit on 03/28/17  Lipid Panel w/o Chol/HDL Ratio  Result Value Ref Range   Cholesterol, Total 150 100 - 199 mg/dL   Triglycerides 119 0 - 149 mg/dL   HDL 65 >39 mg/dL   VLDL Cholesterol Cal 24 5 - 40 mg/dL   LDL Calculated 61 0 - 99 mg/dL  Basic Metabolic Panel (BMET)  Result Value Ref Range   Glucose 92 65 - 99 mg/dL   BUN 12 8 - 27 mg/dL   Creatinine, Ser 0.92 0.57 - 1.00 mg/dL   GFR calc non Af Amer 66 >59 mL/min/1.73   GFR calc Af Amer 77 >59 mL/min/1.73   BUN/Creatinine Ratio 13 12 - 28   Sodium 141 134 - 144 mmol/L   Potassium 4.5 3.5 - 5.2 mmol/L   Chloride 106 96 - 106 mmol/L   CO2 21 20 - 29 mmol/L   Calcium 9.1 8.7 - 10.3 mg/dL      Assessment & Plan:   Problem List Items Addressed This Visit      Cardiovascular and Mediastinum   Atherosclerosis of coronary artery   Relevant Medications   amLODipine (NORVASC) 5 MG tablet   atorvastatin (LIPITOR) 40 MG tablet   Other Relevant Orders   Lipid Panel w/o Chol/HDL Ratio (Completed)   Hypertension - Primary    BPs stable and WNL, continue current regimen      Relevant Medications   amLODipine (NORVASC) 5 MG tablet   atorvastatin (LIPITOR) 40 MG tablet   Other Relevant Orders   Basic Metabolic Panel (BMET) (Completed)     Other   Hyperlipidemia    Await non-fasting lipid results. Continue current regimen and lifestyle modifications      Relevant Medications   amLODipine (NORVASC) 5 MG tablet   atorvastatin (LIPITOR) 40 MG tablet   Depression    Offered several options to pt, including switching to a different medication altogether or increasing trintellix. Will try d/c of wellbutrin and increasing trintellix to 20 mg. Suspect once pt able to resume a more normal activity level her moods will return back to baseline. F/u if no benefit with new regimen.       Relevant  Medications   vortioxetine HBr (TRINTELLIX) 20 MG TABS       Follow up plan: Return in about 6 months (around 09/25/2017) for CPE, AWV.

## 2017-03-29 LAB — BASIC METABOLIC PANEL
BUN / CREAT RATIO: 13 (ref 12–28)
BUN: 12 mg/dL (ref 8–27)
CO2: 21 mmol/L (ref 20–29)
Calcium: 9.1 mg/dL (ref 8.7–10.3)
Chloride: 106 mmol/L (ref 96–106)
Creatinine, Ser: 0.92 mg/dL (ref 0.57–1.00)
GFR, EST AFRICAN AMERICAN: 77 mL/min/{1.73_m2} (ref >59)
GFR, EST NON AFRICAN AMERICAN: 66 mL/min/{1.73_m2} (ref >59)
Glucose: 92 mg/dL (ref 65–99)
POTASSIUM: 4.5 mmol/L (ref 3.5–5.2)
SODIUM: 141 mmol/L (ref 134–144)

## 2017-03-29 LAB — LIPID PANEL W/O CHOL/HDL RATIO
Cholesterol, Total: 150 mg/dL (ref 100–199)
HDL: 65 mg/dL (ref >39)
LDL Calculated: 61 mg/dL (ref 0–99)
TRIGLYCERIDES: 119 mg/dL (ref 0–149)
VLDL CHOLESTEROL CAL: 24 mg/dL (ref 5–40)

## 2017-03-30 NOTE — Assessment & Plan Note (Signed)
Await non-fasting lipid results. Continue current regimen and lifestyle modifications

## 2017-03-30 NOTE — Assessment & Plan Note (Signed)
Offered several options to pt, including switching to a different medication altogether or increasing trintellix. Will try d/c of wellbutrin and increasing trintellix to 20 mg. Suspect once pt able to resume a more normal activity level her moods will return back to baseline. F/u if no benefit with new regimen.

## 2017-03-30 NOTE — Assessment & Plan Note (Signed)
BPs stable and WNL, continue current regimen 

## 2017-03-31 DIAGNOSIS — M9904 Segmental and somatic dysfunction of sacral region: Secondary | ICD-10-CM | POA: Diagnosis not present

## 2017-03-31 DIAGNOSIS — M5136 Other intervertebral disc degeneration, lumbar region: Secondary | ICD-10-CM | POA: Diagnosis not present

## 2017-03-31 DIAGNOSIS — M545 Low back pain: Secondary | ICD-10-CM | POA: Diagnosis not present

## 2017-03-31 DIAGNOSIS — Z5181 Encounter for therapeutic drug level monitoring: Secondary | ICD-10-CM | POA: Diagnosis not present

## 2017-04-03 DIAGNOSIS — Z96649 Presence of unspecified artificial hip joint: Secondary | ICD-10-CM | POA: Diagnosis not present

## 2017-04-09 ENCOUNTER — Telehealth: Payer: Self-pay

## 2017-04-09 DIAGNOSIS — F0630 Mood disorder due to known physiological condition, unspecified: Secondary | ICD-10-CM | POA: Diagnosis not present

## 2017-04-09 MED ORDER — VALACYCLOVIR HCL 500 MG PO TABS
2000.0000 mg | ORAL_TABLET | Freq: Two times a day (BID) | ORAL | 1 refills | Status: DC | PRN
Start: 2017-04-09 — End: 2020-08-10

## 2017-04-09 NOTE — Telephone Encounter (Signed)
Letter from [facility].  Valacyclovir 1000mg  is not covered.    Covered medications are: Valacyclovir 500mg  tab Acyclovir 200mg  cap Acyclovir 400mg  tab Acyclovir 800mg  tab

## 2017-04-09 NOTE — Telephone Encounter (Signed)
Rx sent to her pharmacy 

## 2017-04-21 ENCOUNTER — Telehealth: Payer: Self-pay | Admitting: Family Medicine

## 2017-04-21 DIAGNOSIS — Z5181 Encounter for therapeutic drug level monitoring: Secondary | ICD-10-CM | POA: Diagnosis not present

## 2017-04-21 DIAGNOSIS — M545 Low back pain: Secondary | ICD-10-CM | POA: Diagnosis not present

## 2017-04-21 DIAGNOSIS — M9904 Segmental and somatic dysfunction of sacral region: Secondary | ICD-10-CM | POA: Diagnosis not present

## 2017-04-21 DIAGNOSIS — M5136 Other intervertebral disc degeneration, lumbar region: Secondary | ICD-10-CM | POA: Diagnosis not present

## 2017-04-21 NOTE — Telephone Encounter (Signed)
Copied from [place] [id number]. Topic: Quick Communication - See Telephone Encounter >> Apr 21, 2017  2:49 PM Boyd Kerbs wrote: CRM for notification. See Telephone encounter for:   Pt. Wants Merrie Roof to call her back regarding ordering drug test.   Pain clinic - klonopin showing up and she does not take this.   04/21/17.

## 2017-04-22 MED ORDER — FLUCONAZOLE 150 MG PO TABS: 150.0000 mg | ORAL_TABLET | Freq: Once | ORAL | 0 refills | Status: AC

## 2017-04-22 NOTE — Telephone Encounter (Signed)
Discussed with patient the positive drug screen despite taking the medications and that it would be unhelpful for [place] to repeat the test here as they need their own testing. Pt agreeable and will just keep the matter with the pain clinic alone.   Also having resistant yeast infection, tried monistat with no relief of itching and discharge. Will try diflucan but pt aware if no relief she needs to come in for a swab

## 2017-04-23 DIAGNOSIS — Z96642 Presence of left artificial hip joint: Secondary | ICD-10-CM | POA: Diagnosis not present

## 2017-04-24 ENCOUNTER — Telehealth: Payer: Self-pay | Admitting: Family Medicine

## 2017-04-24 MED ORDER — ALBUTEROL SULFATE HFA 108 (90 BASE) MCG/ACT IN AERS: 2.0000 | INHALATION_SPRAY | Freq: Four times a day (QID) | RESPIRATORY_TRACT | 3 refills | Status: DC | PRN

## 2017-04-24 NOTE — Telephone Encounter (Signed)
Copied from [place] [id number]. Topic: Quick Communication - See Telephone Encounter >> Apr 24, 2017  1:32 PM Ether Griffins B wrote: CRM for notification. See Telephone encounter for:  Pt requesting albuterol inhaler be called in the budesonide-formoterol (SYMBICORT) 160-4.5 MCG/ACT inhaler burns her throat. Makes her feel like  she needs to clear her throat all the time. Please call in to [place], [place] 04/24/17.

## 2017-04-24 NOTE — Telephone Encounter (Signed)
-   Albuterol refilled.

## 2017-05-05 ENCOUNTER — Encounter: Payer: Self-pay | Admitting: Family Medicine

## 2017-05-05 ENCOUNTER — Ambulatory Visit (INDEPENDENT_AMBULATORY_CARE_PROVIDER_SITE_OTHER): Payer: Medicare HMO | Admitting: Family Medicine

## 2017-05-05 VITALS — BP 112/76 | HR 83 | Temp 99.1°F | Wt 151.2 lb

## 2017-05-05 DIAGNOSIS — J111 Influenza due to unidentified influenza virus with other respiratory manifestations: Secondary | ICD-10-CM | POA: Diagnosis not present

## 2017-05-05 DIAGNOSIS — R6889 Other general symptoms and signs: Secondary | ICD-10-CM | POA: Diagnosis not present

## 2017-05-05 LAB — VERITOR FLU A/B WAIVED
INFLUENZA A: NEGATIVE
INFLUENZA B: NEGATIVE

## 2017-05-05 MED ORDER — OSELTAMIVIR PHOSPHATE 75 MG PO CAPS: 75.0000 mg | ORAL_CAPSULE | Freq: Two times a day (BID) | ORAL | 0 refills | Status: DC

## 2017-05-05 MED ORDER — BALOXAVIR MARBOXIL(40 MG DOSE) 2 X 20 MG PO TBPK: 40.0000 mg | ORAL_TABLET | Freq: Once | ORAL | 0 refills | Status: AC

## 2017-05-05 MED ORDER — PREDNISONE 10 MG PO TABS: ORAL_TABLET | ORAL | 0 refills | Status: DC

## 2017-05-05 NOTE — Progress Notes (Signed)
   BP 112/76 (BP Location: Left Arm, Patient Position: Sitting, Cuff Size: Normal)   Pulse 83   Temp 99.1 F (37.3 C) (Tympanic)   Wt 151 lb 3.2 oz (68.6 kg)   LMP 09/13/1975 (Approximate)   SpO2 99%   BMI 25.95 kg/m    Subjective:    Patient ID: Amy Rocha, female    DOB: Mar 25, 1955, 62 y.o.   MRN: [id number]  HPI: Amy Rocha is a 62 y.o. female  Chief Complaint  Patient presents with  . Fatigue    x's 3 days.   . Generalized Body Aches  . Cough  . Nasal Congestion  . Itchy Eye    Left Eye  . Sore Throat  . Headache   Fever, cough, congestion, watery eyes, HA, body aches, fatigue, sore throat x 3 days. Denies CP, SOB, wheezing, N/V/D. Taking lots of OTC cold and flu products with no relief. No sick contacts that she is aware of. UTD on flu vaccine. Hx of COPD, feels her breathing is at baseline for now.   Relevant past medical, surgical, family and social history reviewed and updated as indicated. Interim medical history since our last visit reviewed. Allergies and medications reviewed and updated.  Review of Systems  Per HPI unless specifically indicated above     Objective:    BP 112/76 (BP Location: Left Arm, Patient Position: Sitting, Cuff Size: Normal)   Pulse 83   Temp 99.1 F (37.3 C) (Tympanic)   Wt 151 lb 3.2 oz (68.6 kg)   LMP 09/13/1975 (Approximate)   SpO2 99%   BMI 25.95 kg/m   Wt Readings from Last 3 Encounters:  05/05/17 151 lb 3.2 oz (68.6 kg)  03/28/17 153 lb 12.8 oz (69.8 kg)  01/21/17 155 lb (70.3 kg)    Physical Exam  Constitutional: She is oriented to person, place, and time. She appears well-developed and well-nourished. No distress.  HENT:  Head: Atraumatic.  Eyes: Conjunctivae are normal. Pupils are equal, round, and reactive to light. No scleral icterus.  Neck: Normal range of motion. Neck supple.  Cardiovascular: Normal rate and normal heart sounds.  Pulmonary/Chest: Effort normal and breath sounds normal. No  respiratory distress.  Musculoskeletal: Normal range of motion.  Lymphadenopathy:    She has no cervical adenopathy.  Neurological: She is alert and oriented to person, place, and time.  Skin: Skin is warm and dry.  Psychiatric: She has a normal mood and affect. Her behavior is normal.  Nursing note and vitals reviewed.  Results for orders placed or performed in visit on 05/05/17  Veritor Flu A/B Waived  Result Value Ref Range   Influenza A Negative Negative   Influenza B Negative Negative      Assessment & Plan:   Problem List Items Addressed This Visit    None    Visit Diagnoses    Influenza    -  Primary   Rapid flu neg, but sxs consistent. Tx w/ xofluza, mucinex, supportive care. Pred taper sent in case COPD flares during recovery. Cont allergy/inhaler regimen   Relevant Medications   oseltamivir (TAMIFLU) 75 MG capsule   Other Relevant Orders   Veritor Flu A/B Waived (Completed)       Follow up plan: Return for as scheduled.

## 2017-05-07 NOTE — Patient Instructions (Signed)
Follow up as needed

## 2017-05-14 ENCOUNTER — Other Ambulatory Visit: Payer: Self-pay | Admitting: Family Medicine

## 2017-05-14 MED ORDER — AZITHROMYCIN 250 MG PO TABS: ORAL_TABLET | ORAL | 0 refills | Status: DC

## 2017-05-14 MED ORDER — PREDNISONE 10 MG PO TABS: ORAL_TABLET | ORAL | 0 refills | Status: DC

## 2017-05-14 NOTE — Telephone Encounter (Signed)
Copied from [place] [id number]. Topic: Quick Communication - See Telephone Encounter >> May 14, 2017  8:35 AM Conception Chancy, NT wrote: CRM for notification. See Telephone encounter for: 05/14/17.  Patient is calling and was seen on 05/05/17 and was diagnosed with the flu. Patient states she is feeling worse and would like to know if she can get a refill on predniSONE (DELTASONE) 10 MG tablet  she states she is congested and a headache. Please advise.  [place], [place]  [place] [address]  Phone: [phone] Fax: [phone]

## 2017-05-14 NOTE — Telephone Encounter (Signed)
Called and let patient know about medications and Rachel's instructions.

## 2017-05-14 NOTE — Telephone Encounter (Signed)
Prednisone refills sent as well as azithromycin, continue plain mucinex. If no improvement needs to be evaluated

## 2017-05-19 DIAGNOSIS — M545 Low back pain: Secondary | ICD-10-CM | POA: Diagnosis not present

## 2017-05-19 DIAGNOSIS — M5136 Other intervertebral disc degeneration, lumbar region: Secondary | ICD-10-CM | POA: Diagnosis not present

## 2017-05-19 DIAGNOSIS — M9904 Segmental and somatic dysfunction of sacral region: Secondary | ICD-10-CM | POA: Diagnosis not present

## 2017-05-19 DIAGNOSIS — Z5181 Encounter for therapeutic drug level monitoring: Secondary | ICD-10-CM | POA: Diagnosis not present

## 2017-06-16 DIAGNOSIS — Z5181 Encounter for therapeutic drug level monitoring: Secondary | ICD-10-CM | POA: Diagnosis not present

## 2017-06-16 DIAGNOSIS — M9904 Segmental and somatic dysfunction of sacral region: Secondary | ICD-10-CM | POA: Diagnosis not present

## 2017-06-16 DIAGNOSIS — M5136 Other intervertebral disc degeneration, lumbar region: Secondary | ICD-10-CM | POA: Diagnosis not present

## 2017-06-16 DIAGNOSIS — M545 Low back pain: Secondary | ICD-10-CM | POA: Diagnosis not present

## 2017-07-15 DIAGNOSIS — M5136 Other intervertebral disc degeneration, lumbar region: Secondary | ICD-10-CM | POA: Diagnosis not present

## 2017-07-15 DIAGNOSIS — M545 Low back pain: Secondary | ICD-10-CM | POA: Diagnosis not present

## 2017-07-15 DIAGNOSIS — M9904 Segmental and somatic dysfunction of sacral region: Secondary | ICD-10-CM | POA: Diagnosis not present

## 2017-07-15 DIAGNOSIS — Z5181 Encounter for therapeutic drug level monitoring: Secondary | ICD-10-CM | POA: Diagnosis not present

## 2017-07-25 DIAGNOSIS — S72112D Displaced fracture of greater trochanter of left femur, subsequent encounter for closed fracture with routine healing: Secondary | ICD-10-CM | POA: Diagnosis not present

## 2017-07-25 DIAGNOSIS — Z96642 Presence of left artificial hip joint: Secondary | ICD-10-CM | POA: Diagnosis not present

## 2017-08-11 DIAGNOSIS — M9904 Segmental and somatic dysfunction of sacral region: Secondary | ICD-10-CM | POA: Diagnosis not present

## 2017-08-11 DIAGNOSIS — Z5181 Encounter for therapeutic drug level monitoring: Secondary | ICD-10-CM | POA: Diagnosis not present

## 2017-08-11 DIAGNOSIS — M5136 Other intervertebral disc degeneration, lumbar region: Secondary | ICD-10-CM | POA: Diagnosis not present

## 2017-08-11 DIAGNOSIS — M545 Low back pain: Secondary | ICD-10-CM | POA: Diagnosis not present

## 2017-09-08 DIAGNOSIS — M9904 Segmental and somatic dysfunction of sacral region: Secondary | ICD-10-CM | POA: Diagnosis not present

## 2017-09-08 DIAGNOSIS — Z5181 Encounter for therapeutic drug level monitoring: Secondary | ICD-10-CM | POA: Diagnosis not present

## 2017-09-08 DIAGNOSIS — M545 Low back pain: Secondary | ICD-10-CM | POA: Diagnosis not present

## 2017-09-08 DIAGNOSIS — M5136 Other intervertebral disc degeneration, lumbar region: Secondary | ICD-10-CM | POA: Diagnosis not present

## 2017-09-11 ENCOUNTER — Ambulatory Visit: Payer: Self-pay

## 2017-09-24 ENCOUNTER — Telehealth: Payer: Self-pay | Admitting: Family Medicine

## 2017-09-24 NOTE — Telephone Encounter (Signed)
Called pt to reschedule 10/02/17 appointment cust cancelled due to having surgery soon

## 2017-10-02 ENCOUNTER — Ambulatory Visit: Payer: Medicare HMO | Admitting: Family Medicine

## 2017-10-06 DIAGNOSIS — M9904 Segmental and somatic dysfunction of sacral region: Secondary | ICD-10-CM | POA: Diagnosis not present

## 2017-10-06 DIAGNOSIS — M5136 Other intervertebral disc degeneration, lumbar region: Secondary | ICD-10-CM | POA: Diagnosis not present

## 2017-10-06 DIAGNOSIS — Z5181 Encounter for therapeutic drug level monitoring: Secondary | ICD-10-CM | POA: Diagnosis not present

## 2017-10-06 DIAGNOSIS — M545 Low back pain: Secondary | ICD-10-CM | POA: Diagnosis not present

## 2017-10-07 ENCOUNTER — Encounter
Admission: RE | Admit: 2017-10-07 | Discharge: 2017-10-07 | Disposition: A | Discharge status: Home / Self Care | Payer: Medicare Other | Source: Ambulatory Visit | Attending: Orthopedic Surgery | Admitting: Orthopedic Surgery

## 2017-10-07 ENCOUNTER — Other Ambulatory Visit: Payer: Self-pay

## 2017-10-07 DIAGNOSIS — Z01810 Encounter for preprocedural cardiovascular examination: Secondary | ICD-10-CM | POA: Insufficient documentation

## 2017-10-07 DIAGNOSIS — Z01812 Encounter for preprocedural laboratory examination: Secondary | ICD-10-CM | POA: Insufficient documentation

## 2017-10-07 DIAGNOSIS — I10 Essential (primary) hypertension: Secondary | ICD-10-CM | POA: Diagnosis not present

## 2017-10-07 LAB — TYPE AND SCREEN
ABO/RH(D): A POS
ANTIBODY SCREEN: NEGATIVE

## 2017-10-07 LAB — BASIC METABOLIC PANEL
ANION GAP: 6 (ref 5–15)
BUN: 12 mg/dL (ref 8–23)
CHLORIDE: 108 mmol/L (ref 98–111)
CO2: 27 mmol/L (ref 22–32)
Calcium: 8.7 mg/dL — ABNORMAL LOW (ref 8.9–10.3)
Creatinine, Ser: 1 mg/dL (ref 0.44–1.00)
GFR calc non Af Amer: 58 mL/min — ABNORMAL LOW (ref >60)
Glucose, Bld: 91 mg/dL (ref 70–99)
POTASSIUM: 4.3 mmol/L (ref 3.5–5.1)
SODIUM: 141 mmol/L (ref 135–145)

## 2017-10-07 LAB — CBC
HCT: 41.8 % (ref 35.0–47.0)
HEMOGLOBIN: 14.3 g/dL (ref 12.0–16.0)
MCH: 30.6 pg (ref 26.0–34.0)
MCHC: 34.2 g/dL (ref 32.0–36.0)
MCV: 89.3 fL (ref 80.0–100.0)
PLATELETS: 442 10*3/uL — AB (ref 150–440)
RBC: 4.69 MIL/uL (ref 3.80–5.20)
RDW: 14.9 % — AB (ref 11.5–14.5)
WBC: 8.7 10*3/uL (ref 3.6–11.0)

## 2017-10-07 LAB — SURGICAL PCR SCREEN
MRSA, PCR: NEGATIVE
Staphylococcus aureus: POSITIVE — AB

## 2017-10-07 NOTE — Pre-Procedure Instructions (Signed)
Dr. Theodore Demark office faxed w/ CBC, BMP, Micro results- MRSA negative, SA positive.

## 2017-10-07 NOTE — Patient Instructions (Signed)
  Your procedure is scheduled on: Tuesday October 14, 2017 Report to Same Day Surgery 2nd floor medical mall ([place] Entrance-take elevator on left to 2nd floor.  Check in with surgery information desk.) To find out your arrival time please call [phone] between 1PM - 3PM on Monday October 13, 2017  Remember: Instructions that are not followed completely may result in serious medical risk, up to and including death, or upon the discretion of your surgeon and anesthesiologist your surgery may need to be rescheduled.    _x___ 1. Do not eat food (including mints, candies, chewing gum) after midnight the night before your procedure. You may drink clear liquids up to 2 hours before you are scheduled to arrive at the hospital for your procedure.  Do not drink clear liquids within 2 hours of your scheduled arrival to the hospital.  Clear liquids include  --Water or Apple juice without pulp  --Clear carbohydrate beverage such as Gatorade  --Black Coffee or Clear Tea (No milk, no creamers, do not add anything to the coffee or tea)    __x__ 2. No Alcohol for 24 hours before or after surgery.   __x__ 3. No Smoking or e-cigarettes for 24 prior to surgery.  Do not use any chewable tobacco products for at least 6 hour prior to surgery   __x__ 4. Notify your doctor if there is any change in your medical condition (cold, fever, infections).   __x__ 5. On the morning of surgery brush your teeth with toothpaste and water.  You may rinse your mouth with mouth wash if you wish.  Do not swallow any toothpaste or mouthwash.  Please read over the following fact sheets that you were given:   [facility] Preparing for Surgery and or MRSA Information    __x__ Use CHG Soap or sage wipes as directed on instruction sheet    Do not wear jewelry, make-up, hairpins, clips or nail polish.  Do not wear lotions, powders, deodorant, or perfumes.   Do not shave below the face/neck 48 hours prior to surgery.   Do  not bring valuables to the hospital.    [facility] is not responsible for any belongings or valuables.               Contacts, dentures or bridgework may not be worn into surgery.  Leave your suitcase in the car. After surgery it may be brought to your room.  For patients admitted to the hospital, discharge time is determined by your treatment team.   _x___ Take anti-hypertensive listed below, cardiac, seizure, asthma, anti-reflux and psychiatric medicines. These include:  1. Omeprazole/Prilosec- take one on the morning of surgery  _x___ Use inhalers on the day of surgery and bring to hospital day of surgery (Symbicort and Albuterol)  _x___ Follow recommendations from Cardiologist, Pulmonologist or PCP regarding stopping Aspirin, Coumadin, Plavix ,Eliquis, Effient, or Pradaxa, and Pletal.  _x___ Stop Anti-inflammatories such as Advil, Aleve, Ibuprofen, Motrin, Naproxen, Naprosyn, Goodies powders or aspirin products. OK to take Tylenol and Celebrex.   _x___ NOW: Stop supplements until after surgery.  But may continue Vitamin D, Vitamin B, and multivitamin.

## 2017-10-13 MED ORDER — CEFAZOLIN SODIUM-DEXTROSE 2-4 GM/100ML-% IV SOLN
2.0000 g | Freq: Once | INTRAVENOUS | Status: AC
  Administered 2017-10-14: 2 g via INTRAVENOUS

## 2017-10-14 ENCOUNTER — Inpatient Hospital Stay: Payer: Medicare Other | Admitting: Certified Registered Nurse Anesthetist

## 2017-10-14 ENCOUNTER — Other Ambulatory Visit: Payer: Self-pay

## 2017-10-14 ENCOUNTER — Inpatient Hospital Stay: Payer: Medicare Other

## 2017-10-14 ENCOUNTER — Inpatient Hospital Stay
Admission: RE | Admit: 2017-10-14 | Discharge: 2017-10-16 | DRG: 481 | Disposition: A | Discharge status: Home Health | Payer: Medicare Other | Attending: Orthopedic Surgery | Admitting: Orthopedic Surgery

## 2017-10-14 ENCOUNTER — Encounter
Admission: RE | Disposition: A | Discharge status: Home Health | Payer: Self-pay | Source: Home / Self Care | Attending: Orthopedic Surgery

## 2017-10-14 ENCOUNTER — Encounter: Payer: Self-pay | Admitting: *Deleted

## 2017-10-14 DIAGNOSIS — Z96642 Presence of left artificial hip joint: Secondary | ICD-10-CM | POA: Diagnosis not present

## 2017-10-14 DIAGNOSIS — Z9889 Other specified postprocedural states: Secondary | ICD-10-CM

## 2017-10-14 DIAGNOSIS — N184 Chronic kidney disease, stage 4 (severe): Secondary | ICD-10-CM | POA: Diagnosis not present

## 2017-10-14 DIAGNOSIS — F329 Major depressive disorder, single episode, unspecified: Secondary | ICD-10-CM | POA: Diagnosis present

## 2017-10-14 DIAGNOSIS — M84452A Pathological fracture, left femur, initial encounter for fracture: Secondary | ICD-10-CM | POA: Diagnosis not present

## 2017-10-14 DIAGNOSIS — F419 Anxiety disorder, unspecified: Secondary | ICD-10-CM | POA: Diagnosis present

## 2017-10-14 DIAGNOSIS — I509 Heart failure, unspecified: Secondary | ICD-10-CM | POA: Diagnosis not present

## 2017-10-14 DIAGNOSIS — Z90710 Acquired absence of both cervix and uterus: Secondary | ICD-10-CM

## 2017-10-14 DIAGNOSIS — S72115A Nondisplaced fracture of greater trochanter of left femur, initial encounter for closed fracture: Secondary | ICD-10-CM | POA: Diagnosis not present

## 2017-10-14 DIAGNOSIS — X58XXXA Exposure to other specified factors, initial encounter: Secondary | ICD-10-CM | POA: Diagnosis present

## 2017-10-14 DIAGNOSIS — G8918 Other acute postprocedural pain: Secondary | ICD-10-CM

## 2017-10-14 DIAGNOSIS — E785 Hyperlipidemia, unspecified: Secondary | ICD-10-CM | POA: Diagnosis present

## 2017-10-14 DIAGNOSIS — I959 Hypotension, unspecified: Secondary | ICD-10-CM | POA: Diagnosis not present

## 2017-10-14 DIAGNOSIS — Z87891 Personal history of nicotine dependence: Secondary | ICD-10-CM

## 2017-10-14 DIAGNOSIS — D62 Acute posthemorrhagic anemia: Secondary | ICD-10-CM | POA: Diagnosis present

## 2017-10-14 DIAGNOSIS — K219 Gastro-esophageal reflux disease without esophagitis: Secondary | ICD-10-CM | POA: Diagnosis present

## 2017-10-14 DIAGNOSIS — S72112K Displaced fracture of greater trochanter of left femur, subsequent encounter for closed fracture with nonunion: Secondary | ICD-10-CM | POA: Diagnosis not present

## 2017-10-14 DIAGNOSIS — K589 Irritable bowel syndrome without diarrhea: Secondary | ICD-10-CM | POA: Diagnosis present

## 2017-10-14 DIAGNOSIS — I2510 Atherosclerotic heart disease of native coronary artery without angina pectoris: Secondary | ICD-10-CM | POA: Diagnosis not present

## 2017-10-14 DIAGNOSIS — Z8781 Personal history of (healed) traumatic fracture: Secondary | ICD-10-CM

## 2017-10-14 DIAGNOSIS — I130 Hypertensive heart and chronic kidney disease with heart failure and stage 1 through stage 4 chronic kidney disease, or unspecified chronic kidney disease: Secondary | ICD-10-CM | POA: Diagnosis not present

## 2017-10-14 DIAGNOSIS — S72145K Nondisplaced intertrochanteric fracture of left femur, subsequent encounter for closed fracture with nonunion: Secondary | ICD-10-CM | POA: Diagnosis not present

## 2017-10-14 DIAGNOSIS — J449 Chronic obstructive pulmonary disease, unspecified: Secondary | ICD-10-CM | POA: Diagnosis not present

## 2017-10-14 DIAGNOSIS — I739 Peripheral vascular disease, unspecified: Secondary | ICD-10-CM | POA: Diagnosis not present

## 2017-10-14 DIAGNOSIS — I5032 Chronic diastolic (congestive) heart failure: Secondary | ICD-10-CM | POA: Diagnosis not present

## 2017-10-14 DIAGNOSIS — M810 Age-related osteoporosis without current pathological fracture: Secondary | ICD-10-CM | POA: Diagnosis not present

## 2017-10-14 DIAGNOSIS — G894 Chronic pain syndrome: Secondary | ICD-10-CM | POA: Diagnosis not present

## 2017-10-14 DIAGNOSIS — Z419 Encounter for procedure for purposes other than remedying health state, unspecified: Secondary | ICD-10-CM

## 2017-10-14 HISTORY — PX: INTRAMEDULLARY (IM) NAIL INTERTROCHANTERIC: SHX5875

## 2017-10-14 SURGERY — FIXATION, FRACTURE, INTERTROCHANTERIC, WITH INTRAMEDULLARY ROD
Anesthesia: Spinal | Site: Leg Upper | Laterality: Left | Wound class: Clean

## 2017-10-14 MED ORDER — FLUTICASONE PROPIONATE 50 MCG/ACT NA SUSP
2.0000 | Freq: Every day | NASAL | Status: DC | PRN
  Filled 2017-10-14: qty 16

## 2017-10-14 MED ORDER — METOCLOPRAMIDE HCL 5 MG/ML IJ SOLN: 5.0000 mg | Freq: Three times a day (TID) | INTRAMUSCULAR | Status: DC | PRN

## 2017-10-14 MED ORDER — FENTANYL CITRATE (PF) 100 MCG/2ML IJ SOLN
INTRAMUSCULAR | Status: AC
  Filled 2017-10-14: qty 2

## 2017-10-14 MED ORDER — PROPOFOL 500 MG/50ML IV EMUL
INTRAVENOUS | Status: AC
  Filled 2017-10-14: qty 50

## 2017-10-14 MED ORDER — ALBUTEROL SULFATE (2.5 MG/3ML) 0.083% IN NEBU: 2.5000 mg | INHALATION_SOLUTION | Freq: Four times a day (QID) | RESPIRATORY_TRACT | Status: DC | PRN

## 2017-10-14 MED ORDER — PANTOPRAZOLE SODIUM 40 MG PO TBEC
80.0000 mg | DELAYED_RELEASE_TABLET | Freq: Every day | ORAL | Status: DC
  Administered 2017-10-15 – 2017-10-16 (×2): 80 mg via ORAL
  Filled 2017-10-14 (×2): qty 2

## 2017-10-14 MED ORDER — ACETAMINOPHEN 500 MG PO TABS
1000.0000 mg | ORAL_TABLET | Freq: Four times a day (QID) | ORAL | Status: AC
  Administered 2017-10-14 – 2017-10-15 (×3): 1000 mg via ORAL
  Filled 2017-10-14 (×3): qty 2

## 2017-10-14 MED ORDER — MIDAZOLAM HCL 2 MG/2ML IJ SOLN
INTRAMUSCULAR | Status: AC
  Filled 2017-10-14: qty 2

## 2017-10-14 MED ORDER — TRAMADOL HCL 50 MG PO TABS
50.0000 mg | ORAL_TABLET | Freq: Four times a day (QID) | ORAL | Status: DC
  Administered 2017-10-14 – 2017-10-16 (×6): 50 mg via ORAL
  Filled 2017-10-14 (×6): qty 1

## 2017-10-14 MED ORDER — BISACODYL 5 MG PO TBEC: 5.0000 mg | DELAYED_RELEASE_TABLET | Freq: Every day | ORAL | Status: DC | PRN

## 2017-10-14 MED ORDER — ALUM & MAG HYDROXIDE-SIMETH 200-200-20 MG/5ML PO SUSP: 30.0000 mL | ORAL | Status: DC | PRN

## 2017-10-14 MED ORDER — NEOMYCIN-POLYMYXIN B GU 40-200000 IR SOLN
Status: DC | PRN
  Administered 2017-10-14: 2 mL

## 2017-10-14 MED ORDER — OXYCODONE HCL 20 MG PO TABS: 20.0000 mg | ORAL_TABLET | ORAL | Status: DC | PRN

## 2017-10-14 MED ORDER — DOCUSATE SODIUM 100 MG PO CAPS
100.0000 mg | ORAL_CAPSULE | Freq: Two times a day (BID) | ORAL | Status: DC
  Administered 2017-10-14 – 2017-10-16 (×4): 100 mg via ORAL
  Filled 2017-10-14 (×4): qty 1

## 2017-10-14 MED ORDER — ACETAMINOPHEN 500 MG PO TABS: 1000.0000 mg | ORAL_TABLET | Freq: Two times a day (BID) | ORAL | Status: DC | PRN

## 2017-10-14 MED ORDER — FENTANYL CITRATE (PF) 100 MCG/2ML IJ SOLN
INTRAMUSCULAR | Status: DC | PRN
  Administered 2017-10-14: 25 ug via INTRAVENOUS
  Administered 2017-10-14: 50 ug via INTRAVENOUS
  Administered 2017-10-14: 25 ug via INTRAVENOUS

## 2017-10-14 MED ORDER — BUPIVACAINE HCL (PF) 0.5 % IJ SOLN
INTRAMUSCULAR | Status: DC | PRN
  Administered 2017-10-14: 3 mL

## 2017-10-14 MED ORDER — PROPOFOL 500 MG/50ML IV EMUL
INTRAVENOUS | Status: DC | PRN
  Administered 2017-10-14: 100 ug/kg/min via INTRAVENOUS

## 2017-10-14 MED ORDER — HYDROMORPHONE HCL 1 MG/ML IJ SOLN
0.5000 mg | INTRAMUSCULAR | Status: DC | PRN
  Administered 2017-10-14: 1 mg via INTRAVENOUS
  Filled 2017-10-14: qty 1

## 2017-10-14 MED ORDER — ONDANSETRON 4 MG PO TBDP: 4.0000 mg | ORAL_TABLET | Freq: Three times a day (TID) | ORAL | Status: DC | PRN

## 2017-10-14 MED ORDER — ACETAMINOPHEN 10 MG/ML IV SOLN
INTRAVENOUS | Status: DC | PRN
  Administered 2017-10-14: 1000 mg via INTRAVENOUS

## 2017-10-14 MED ORDER — ACETAMINOPHEN 325 MG PO TABS
325.0000 mg | ORAL_TABLET | Freq: Four times a day (QID) | ORAL | Status: DC | PRN
  Administered 2017-10-15 – 2017-10-16 (×2): 650 mg via ORAL
  Filled 2017-10-14 (×2): qty 2

## 2017-10-14 MED ORDER — ONDANSETRON HCL 4 MG/2ML IJ SOLN: 4.0000 mg | Freq: Once | INTRAMUSCULAR | Status: DC | PRN

## 2017-10-14 MED ORDER — SENNOSIDES-DOCUSATE SODIUM 8.6-50 MG PO TABS
1.0000 | ORAL_TABLET | Freq: Every evening | ORAL | Status: DC | PRN
  Administered 2017-10-14: 1 via ORAL
  Filled 2017-10-14: qty 1

## 2017-10-14 MED ORDER — MAGNESIUM CITRATE PO SOLN
1.0000 | Freq: Once | ORAL | Status: DC | PRN
  Filled 2017-10-14: qty 296

## 2017-10-14 MED ORDER — LIDOCAINE HCL (CARDIAC) PF 100 MG/5ML IV SOSY
PREFILLED_SYRINGE | INTRAVENOUS | Status: DC | PRN
  Administered 2017-10-14: 50 mg via INTRAVENOUS

## 2017-10-14 MED ORDER — NALOXONE HCL 4 MG/0.1ML NA LIQD: 1.0000 | Freq: Once | NASAL | Status: DC

## 2017-10-14 MED ORDER — METHOCARBAMOL 500 MG PO TABS
500.0000 mg | ORAL_TABLET | Freq: Four times a day (QID) | ORAL | Status: DC | PRN
  Administered 2017-10-14 – 2017-10-16 (×4): 500 mg via ORAL
  Filled 2017-10-14 (×4): qty 1

## 2017-10-14 MED ORDER — SODIUM CHLORIDE 0.9 % IV SOLN
INTRAVENOUS | Status: DC | PRN
  Administered 2017-10-14: 50 ug/min via INTRAVENOUS

## 2017-10-14 MED ORDER — LACTATED RINGERS IV SOLN
INTRAVENOUS | Status: DC
  Administered 2017-10-14: 10:00:00 via INTRAVENOUS

## 2017-10-14 MED ORDER — GABAPENTIN 300 MG PO CAPS
300.0000 mg | ORAL_CAPSULE | Freq: Three times a day (TID) | ORAL | Status: DC
  Administered 2017-10-14 – 2017-10-16 (×6): 300 mg via ORAL
  Filled 2017-10-14 (×6): qty 1

## 2017-10-14 MED ORDER — VORTIOXETINE HBR 5 MG PO TABS
20.0000 mg | ORAL_TABLET | Freq: Every evening | ORAL | Status: DC
  Administered 2017-10-15: 20 mg via ORAL
  Filled 2017-10-14: qty 4

## 2017-10-14 MED ORDER — METOCLOPRAMIDE HCL 10 MG PO TABS: 5.0000 mg | ORAL_TABLET | Freq: Three times a day (TID) | ORAL | Status: DC | PRN

## 2017-10-14 MED ORDER — OXYCODONE HCL 5 MG PO TABS
10.0000 mg | ORAL_TABLET | ORAL | Status: DC | PRN
  Administered 2017-10-14 – 2017-10-15 (×4): 15 mg via ORAL
  Filled 2017-10-14: qty 3
  Filled 2017-10-14: qty 2
  Filled 2017-10-14 (×3): qty 3

## 2017-10-14 MED ORDER — METHOCARBAMOL 1000 MG/10ML IJ SOLN
500.0000 mg | Freq: Four times a day (QID) | INTRAVENOUS | Status: DC | PRN
  Filled 2017-10-14: qty 5

## 2017-10-14 MED ORDER — ASPIRIN EC 325 MG PO TBEC
325.0000 mg | DELAYED_RELEASE_TABLET | Freq: Every day | ORAL | Status: DC
  Administered 2017-10-15 – 2017-10-16 (×2): 325 mg via ORAL
  Filled 2017-10-14 (×2): qty 1

## 2017-10-14 MED ORDER — SODIUM CHLORIDE 0.9 % IV SOLN
INTRAVENOUS | Status: DC
  Administered 2017-10-14 – 2017-10-15 (×4): via INTRAVENOUS

## 2017-10-14 MED ORDER — ACETAMINOPHEN 10 MG/ML IV SOLN
INTRAVENOUS | Status: AC
  Filled 2017-10-14: qty 100

## 2017-10-14 MED ORDER — PROPOFOL 10 MG/ML IV BOLUS
INTRAVENOUS | Status: DC | PRN
  Administered 2017-10-14: 60 mg via INTRAVENOUS

## 2017-10-14 MED ORDER — VALACYCLOVIR HCL 500 MG PO TABS
2000.0000 mg | ORAL_TABLET | Freq: Two times a day (BID) | ORAL | Status: DC | PRN
  Filled 2017-10-14: qty 4

## 2017-10-14 MED ORDER — FLUTICASONE FUROATE-VILANTEROL 200-25 MCG/INH IN AEPB
1.0000 | INHALATION_SPRAY | Freq: Every day | RESPIRATORY_TRACT | Status: DC
  Administered 2017-10-15 – 2017-10-16 (×2): 1 via RESPIRATORY_TRACT
  Filled 2017-10-14 (×2): qty 28

## 2017-10-14 MED ORDER — ONDANSETRON HCL 4 MG PO TABS: 4.0000 mg | ORAL_TABLET | Freq: Four times a day (QID) | ORAL | Status: DC | PRN

## 2017-10-14 MED ORDER — OXYCODONE HCL 5 MG PO TABS: 5.0000 mg | ORAL_TABLET | ORAL | Status: DC | PRN

## 2017-10-14 MED ORDER — ONDANSETRON HCL 4 MG/2ML IJ SOLN: 4.0000 mg | Freq: Four times a day (QID) | INTRAMUSCULAR | Status: DC | PRN

## 2017-10-14 MED ORDER — AMLODIPINE BESYLATE 5 MG PO TABS: 5.0000 mg | ORAL_TABLET | Freq: Every day | ORAL | Status: DC

## 2017-10-14 MED ORDER — LIDOCAINE HCL (PF) 2 % IJ SOLN
INTRAMUSCULAR | Status: AC
  Filled 2017-10-14: qty 10

## 2017-10-14 MED ORDER — MAGNESIUM HYDROXIDE 400 MG/5ML PO SUSP
30.0000 mL | Freq: Every day | ORAL | Status: DC | PRN
  Administered 2017-10-15: 30 mL via ORAL
  Filled 2017-10-14: qty 30

## 2017-10-14 MED ORDER — PHENOL 1.4 % MT LIQD
1.0000 | OROMUCOSAL | Status: DC | PRN
  Filled 2017-10-14: qty 177

## 2017-10-14 MED ORDER — PHENYLEPHRINE HCL 10 MG/ML IJ SOLN
INTRAMUSCULAR | Status: DC | PRN
  Administered 2017-10-14: 100 ug via INTRAVENOUS
  Administered 2017-10-14: 200 ug via INTRAVENOUS
  Administered 2017-10-14: 100 ug via INTRAVENOUS
  Administered 2017-10-14: 200 ug via INTRAVENOUS
  Administered 2017-10-14: 150 ug via INTRAVENOUS

## 2017-10-14 MED ORDER — MENTHOL 3 MG MT LOZG
1.0000 | LOZENGE | OROMUCOSAL | Status: DC | PRN
  Filled 2017-10-14: qty 9

## 2017-10-14 MED ORDER — MIDAZOLAM HCL 5 MG/5ML IJ SOLN
INTRAMUSCULAR | Status: DC | PRN
  Administered 2017-10-14 (×2): 1 mg via INTRAVENOUS

## 2017-10-14 MED ORDER — ATORVASTATIN CALCIUM 20 MG PO TABS
40.0000 mg | ORAL_TABLET | Freq: Every day | ORAL | Status: DC
  Administered 2017-10-14 – 2017-10-15 (×2): 40 mg via ORAL
  Filled 2017-10-14 (×2): qty 2

## 2017-10-14 MED ORDER — FENTANYL CITRATE (PF) 100 MCG/2ML IJ SOLN: 25.0000 ug | INTRAMUSCULAR | Status: DC | PRN

## 2017-10-14 MED ORDER — LORATADINE 10 MG PO TABS: 10.0000 mg | ORAL_TABLET | Freq: Every day | ORAL | Status: DC | PRN

## 2017-10-14 MED ORDER — ZOLPIDEM TARTRATE 5 MG PO TABS
5.0000 mg | ORAL_TABLET | Freq: Every evening | ORAL | Status: DC | PRN
  Administered 2017-10-14 – 2017-10-15 (×2): 5 mg via ORAL
  Filled 2017-10-14 (×2): qty 1

## 2017-10-14 SURGICAL SUPPLY — 37 items
CABLE INTEGRAL SHORT GTR W/2 (Cable) ×2 IMPLANT
CANISTER SUCT 1200ML W/VALVE (MISCELLANEOUS) ×3 IMPLANT
CHLORAPREP W/TINT 26ML (MISCELLANEOUS) ×3 IMPLANT
DRAPE C-ARM XRAY 36X54 (DRAPES) ×3 IMPLANT
DRAPE C-ARMOR (DRAPES) ×3 IMPLANT
DRESSING SURGICEL FIBRLLR 1X2 (HEMOSTASIS) IMPLANT
DRSG SURGICEL FIBRILLAR 1X2 (HEMOSTASIS) ×6
ELECT REM PT RETURN 9FT ADLT (ELECTROSURGICAL) ×3
ELECTRODE REM PT RTRN 9FT ADLT (ELECTROSURGICAL) ×1 IMPLANT
GAUZE PETRO XEROFOAM 1X8 (MISCELLANEOUS) ×3 IMPLANT
GAUZE SPONGE 4X4 12PLY STRL (GAUZE/BANDAGES/DRESSINGS) ×3 IMPLANT
GLOVE BIOGEL PI IND STRL 9 (GLOVE) ×1 IMPLANT
GLOVE BIOGEL PI INDICATOR 9 (GLOVE) ×2
GLOVE SURG SYN 9.0  PF PI (GLOVE) ×2
GLOVE SURG SYN 9.0 PF PI (GLOVE) ×1 IMPLANT
GOWN SRG 2XL LVL 4 RGLN SLV (GOWNS) ×1 IMPLANT
GOWN STRL NON-REIN 2XL LVL4 (GOWNS) ×3
GOWN STRL REUS W/ TWL LRG LVL3 (GOWN DISPOSABLE) ×1 IMPLANT
GOWN STRL REUS W/TWL LRG LVL3 (GOWN DISPOSABLE) ×3
HEMOVAC 400ML (MISCELLANEOUS) ×3
KIT DRAIN HEMOVAC JP 7FR 400ML (MISCELLANEOUS) ×1 IMPLANT
KIT TURNOVER KIT A (KITS) ×3 IMPLANT
MAT ABSORB  FLUID 56X50 GRAY (MISCELLANEOUS) ×2
MAT ABSORB FLUID 56X50 GRAY (MISCELLANEOUS) ×1 IMPLANT
NDL FILTER BLUNT 18X1 1/2 (NEEDLE) ×1 IMPLANT
NEEDLE FILTER BLUNT 18X 1/2SAF (NEEDLE) ×2
NEEDLE FILTER BLUNT 18X1 1/2 (NEEDLE) ×1 IMPLANT
NS IRRIG 500ML POUR BTL (IV SOLUTION) ×3 IMPLANT
PACK TOTAL KNEE (MISCELLANEOUS) ×3 IMPLANT
PUTTY DBX 10CC (Bone Implant) ×2 IMPLANT
SCALPEL PROTECTED #10 DISP (BLADE) ×6 IMPLANT
STAPLER SKIN PROX 35W (STAPLE) ×3 IMPLANT
SUT VIC AB 0 CT1 36 (SUTURE) ×6 IMPLANT
SUT VIC AB 2-0 CT1 27 (SUTURE) ×6
SUT VIC AB 2-0 CT1 TAPERPNT 27 (SUTURE) ×2 IMPLANT
SYR 5ML LL (SYRINGE) ×3 IMPLANT
TAPE MICROFOAM 4IN (TAPE) ×3 IMPLANT

## 2017-10-14 NOTE — Anesthesia Procedure Notes (Signed)
Spinal  Patient location during procedure: OR Start time: 10/14/2017 10:43 AM End time: 10/14/2017 10:49 AM Staffing Anesthesiologist: Molli Barrows, MD Resident/CRNA: Johnna Acosta, CRNA Performed: resident/CRNA  Preanesthetic Checklist Completed: patient identified, site marked, surgical consent, pre-op evaluation, timeout performed, IV checked, risks and benefits discussed and monitors and equipment checked Spinal Block Patient position: sitting Prep: ChloraPrep Patient monitoring: heart rate, continuous pulse ox, blood pressure and cardiac monitor Approach: midline Location: L3-4 Injection technique: single-shot Needle Needle type: [place] and Introducer  Needle gauge: 24 G Needle length: 9 cm Assessment Sensory level: T10 Additional Notes Negative paresthesia. Negative blood return. Positive free-flowing CSF. Expiration date of kit checked and confirmed. Patient tolerated procedure well, without complications.

## 2017-10-14 NOTE — Anesthesia Post-op Follow-up Note (Signed)
Anesthesia QCDR form completed.        

## 2017-10-14 NOTE — NC FL2 (Signed)
[place] LEVEL OF CARE SCREENING TOOL     IDENTIFICATION  Patient Name: Amy Rocha Birthdate: [birth year] Sex: female Admission Date (Current Location): 10/14/2017  [facility] and [place] Number:  Amy Rocha ([id number] [facility]) Facility and Address:  [facility], [address] [place], [place] [address]      Provider Number: [id number]  Attending Physician Name and Address:  Hessie Knows, MD  Relative Name and Phone Number:       Current Level of Care: Hospital Recommended Level of Care: [place] Prior Approval Number:    Date Approved/Denied:   PASRR Number: [id number])  Discharge Plan: SNF    Current Diagnoses: Patient Active Problem List   Diagnosis Date Noted  . S/P ORIF (open reduction internal fixation) fracture 10/14/2017  . Failed total hip arthroplasty ([place]) 01/21/2017  . Hypertension 09/25/2016  . Oral-nasal fistula 02/06/2016  . Atherosclerotic peripheral vascular disease ([place]) 09/21/2015  . Insomnia 09/06/2015  . Complex regional pain syndrome of lower extremity 07/11/2015  . Bilateral occipital neuralgia 05/10/2015  . DDD (degenerative disc disease), lumbar 10/26/2014  . Facet syndrome, lumbar 10/26/2014  . History of total hip replacement 10/26/2014  . Avascular necrosis of bones of both hips ([place]) 10/26/2014  . Sacroiliac joint dysfunction 10/26/2014  . Calculus of kidney 09/08/2014  . Acute anxiety 09/08/2014  . GERD (gastroesophageal reflux disease) 09/08/2014  . Joint pain of leg 09/08/2014  . Urge incontinence 09/08/2014  . Hypertensive heart failure ([place]) 09/08/2014  . Avascular necrosis ([place]) 09/08/2014  . Anemia 09/08/2014  . Atherosclerosis of coronary artery 09/08/2014  . IBS (irritable bowel syndrome) 09/08/2014  . Asthma 09/08/2014  . CHF (congestive heart failure) ([place]) 09/08/2014  . Hyperlipidemia 09/08/2014  . COPD (chronic obstructive pulmonary disease) ([place]) 09/08/2014  .  Depression 09/08/2014  . Hypertensive CKD (chronic kidney disease) 09/08/2014  . CKD (chronic kidney disease), stage IV ([place]) 09/08/2014  . Proteinuria 09/08/2014  . Chronic pain 09/08/2014    Orientation RESPIRATION BLADDER Height & Weight     Self, Time, Situation, Place  Normal Continent Weight: 152 lb 8 oz (69.2 kg) Height:  5\' 4"  (162.6 cm)  BEHAVIORAL SYMPTOMS/MOOD NEUROLOGICAL BOWEL NUTRITION STATUS      Continent Diet(Diet: Regular )  AMBULATORY STATUS COMMUNICATION OF NEEDS Skin   Extensive Assist Verbally Surgical wounds, Wound Vac(Incision: Left Leg. provena wound vac. )                       Personal Care Assistance Level of Assistance  Bathing, Feeding, Dressing Bathing Assistance: Limited assistance Feeding assistance: Independent Dressing Assistance: Limited assistance     Functional Limitations Info  Sight, Hearing, Speech Sight Info: Adequate Hearing Info: Adequate Speech Info: Adequate    SPECIAL CARE FACTORS FREQUENCY  PT (By licensed PT), OT (By licensed OT)     PT Frequency: (5) OT Frequency: (5)            Contractures      Additional Factors Info  Code Status, Allergies Code Status Info: (Full Code. ) Allergies Info: (Fluticasone-salmeterol, Lactose Intolerance (Gi), Tetracyclines & Related)           Current Medications (10/14/2017):  This is the current hospital active medication list Current Facility-Administered Medications  Medication Dose Route Frequency Provider Last Rate Last Dose  . 0.9 %  sodium chloride infusion   Intravenous Continuous Hessie Knows, MD      . acetaminophen (TYLENOL) tablet 1,000 mg  1,000 mg  Oral Q6H Hessie Knows, MD      . Derrill Memo ON 10/15/2017] acetaminophen (TYLENOL) tablet 325-650 mg  325-650 mg Oral Q6H PRN Hessie Knows, MD      . albuterol (PROVENTIL) (2.5 MG/3ML) 0.083% nebulizer solution 2.5 mg  2.5 mg Inhalation Q6H PRN Hessie Knows, MD      . alum & mag hydroxide-simeth (MAALOX/MYLANTA)  200-200-20 MG/5ML suspension 30 mL  30 mL Oral Q4H PRN Hessie Knows, MD      . amLODipine (NORVASC) tablet 5 mg  5 mg Oral QHS Hessie Knows, MD      . Derrill Memo ON 10/15/2017] aspirin EC tablet 325 mg  325 mg Oral Q breakfast Hessie Knows, MD      . atorvastatin (LIPITOR) tablet 40 mg  40 mg Oral QHS Hessie Knows, MD      . bisacodyl (DULCOLAX) EC tablet 5 mg  5 mg Oral Daily PRN Hessie Knows, MD      . docusate sodium (COLACE) capsule 100 mg  100 mg Oral BID Hessie Knows, MD      . fluticasone (FLONASE) 50 MCG/ACT nasal spray 2 spray  2 spray Each Nare Daily PRN Hessie Knows, MD      . fluticasone furoate-vilanterol (BREO ELLIPTA) 200-25 MCG/INH 1 puff  1 puff Inhalation Daily Hessie Knows, MD      . gabapentin (NEURONTIN) capsule 300 mg  300 mg Oral TID Hessie Knows, MD      . HYDROmorphone (DILAUDID) injection 0.5-1 mg  0.5-1 mg Intravenous Q4H PRN Hessie Knows, MD      . loratadine (CLARITIN) tablet 10 mg  10 mg Oral Daily PRN Hessie Knows, MD      . magnesium citrate solution 1 Bottle  1 Bottle Oral Once PRN Hessie Knows, MD      . magnesium hydroxide (MILK OF MAGNESIA) suspension 30 mL  30 mL Oral Daily PRN Hessie Knows, MD      . menthol-cetylpyridinium (CEPACOL) lozenge 3 mg  1 lozenge Oral PRN Hessie Knows, MD       Or  . phenol (CHLORASEPTIC) mouth spray 1 spray  1 spray Mouth/Throat PRN Hessie Knows, MD      . methocarbamol (ROBAXIN) tablet 500 mg  500 mg Oral Q6H PRN Hessie Knows, MD       Or  . methocarbamol (ROBAXIN) 500 mg in dextrose 5 % 50 mL IVPB  500 mg Intravenous Q6H PRN Hessie Knows, MD      . metoCLOPramide (REGLAN) tablet 5-10 mg  5-10 mg Oral Q8H PRN Hessie Knows, MD       Or  . metoCLOPramide (REGLAN) injection 5-10 mg  5-10 mg Intravenous Q8H PRN Hessie Knows, MD      . ondansetron [facility]) tablet 4 mg  4 mg Oral Q6H PRN Hessie Knows, MD       Or  . ondansetron [facility]) injection 4 mg  4 mg Intravenous Q6H PRN Hessie Knows, MD      . oxyCODONE (Oxy  IR/ROXICODONE) immediate release tablet 10-15 mg  10-15 mg Oral Q4H PRN Hessie Knows, MD      . oxyCODONE (Oxy IR/ROXICODONE) immediate release tablet 5-10 mg  5-10 mg Oral Q4H PRN Hessie Knows, MD      . Derrill Memo ON 10/15/2017] pantoprazole (PROTONIX) EC tablet 80 mg  80 mg Oral Daily Hessie Knows, MD      . senna-docusate (Senokot-S) tablet 1 tablet  1 tablet Oral QHS PRN Hessie Knows, MD      .  traMADol (ULTRAM) tablet 50 mg  50 mg Oral Q6H Hessie Knows, MD      . valACYclovir (VALTREX) tablet 2,000 mg  2,000 mg Oral BID PRN Hessie Knows, MD      . vortioxetine HBr (TRINTELLIX) tablet 20 mg  20 mg Oral QPM Hessie Knows, MD      . zolpidem (AMBIEN) tablet 5 mg  5 mg Oral QHS PRN Hessie Knows, MD         Discharge Medications: Please see discharge summary for a list of discharge medications.  Relevant Imaging Results:  Relevant Lab Results:   Additional Information (SSN: [id number])  Ariel Wingrove, Veronia Beets, LCSW

## 2017-10-14 NOTE — Anesthesia Preprocedure Evaluation (Signed)
Anesthesia Evaluation  Patient identified by MRN, date of birth, ID band Patient awake    Reviewed: Allergy & Precautions, H&P , NPO status , Patient's Chart, lab work & pertinent test results, reviewed documented beta blocker date and time   History of Anesthesia Complications (+) PONV and history of anesthetic complications  Airway Mallampati: II   Neck ROM: full    Dental  (+) Poor Dentition, Teeth Intact   Pulmonary neg pulmonary ROS, neg shortness of breath, asthma , COPD, former smoker,    Pulmonary exam normal        Cardiovascular Exercise Tolerance: Poor hypertension, On Medications + CAD, + Peripheral Vascular Disease and +CHF  negative cardio ROS Normal cardiovascular exam Rhythm:regular Rate:Normal     Neuro/Psych PSYCHIATRIC DISORDERS Anxiety Depression  Neuromuscular disease negative neurological ROS  negative psych ROS   GI/Hepatic negative GI ROS, Neg liver ROS, GERD  Medicated,  Endo/Other  negative endocrine ROS  Renal/GU CRFRenal diseasenegative Renal ROS  negative genitourinary   Musculoskeletal   Abdominal   Peds  Hematology negative hematology ROS (+) anemia ,   Anesthesia Other Findings Past Medical History: No date: Anxiety No date: Arthritis No date: Asthma No date: Avascular necrosis (HCC) No date: Blood transfusion without reported diagnosis No date: Calculus of kidney No date: CHF (congestive heart failure) (HCC) No date: Chronic pain syndrome No date: Complication of anesthesia     Comment:  aspirated in recovery after  hip replacement 1998 No date: COPD (chronic obstructive pulmonary disease) (HCC) No date: Depression No date: GERD (gastroesophageal reflux disease) No date: Heart failure (HCC) No date: Hyperlipidemia No date: Hypertension No date: IBS (irritable bowel syndrome) No date: Joint pain No date: Osteoporosis No date: Oxygen deficiency No date: Peripheral  vascular disease (HCC) No date: PONV (postoperative nausea and vomiting) No date: Urge incontinence Past Surgical History: No date: ABDOMINAL HYSTERECTOMY No date: avascular necrosis 12/01/2015: CARDIAC CATHETERIZATION; Left     Comment:  Procedure: Left Heart Cath and Coronary Angiography;                Surgeon: Teodoro Spray, MD;  Location: [facility];  Service: Cardiovascular;  Laterality: Left; No date: eyelid lift No date: HERNIA REPAIR No date: JOINT REPLACEMENT     Comment:  hip No date: NASAL SINUS SURGERY 01/21/2017: TOTAL HIP REVISION; Left     Comment:  Procedure: TOTAL HIP REVISION FEMORAL STEM WITH               POLYETHYLENE EXCHANGE WITH CUPLINER;  Surgeon: Hessie Knows, MD;  Location: [facility];  Service: Orthopedics;               Laterality: Left; BMI    Body Mass Index:  26.18 kg/m     Reproductive/Obstetrics negative OB ROS                             Anesthesia Physical Anesthesia Plan  ASA: III  Anesthesia Plan: General and Spinal   Post-op Pain Management:    Induction:   PONV Risk Score and Plan:   Airway Management Planned:   Additional Equipment:   Intra-op Plan:   Post-operative Plan:   Informed Consent: I have reviewed the patients History and Physical, chart, labs and discussed  the procedure including the risks, benefits and alternatives for the proposed anesthesia with the patient or authorized representative who has indicated his/her understanding and acceptance.   Dental Advisory Given  Plan Discussed with: CRNA  Anesthesia Plan Comments:         Anesthesia Quick Evaluation

## 2017-10-14 NOTE — H&P (Signed)
Reviewed paper H+P, will be scanned into chart. No changes noted.  

## 2017-10-14 NOTE — Transfer of Care (Signed)
Immediate Anesthesia Transfer of Care Note  Patient: Amy Rocha  Procedure(s) Performed: REPAIR OF LEFT GREATER TROCHANTER NONUNION (Left Leg Upper)  Patient Location: PACU  Anesthesia Type:General  Level of Consciousness: awake, alert  and oriented  Airway & Oxygen Therapy: Patient Spontanous Breathing and Patient connected to face mask oxygen  Post-op Assessment: Report given to RN and Post -op Vital signs reviewed and stable  Post vital signs: Reviewed and stable  Last Vitals:  Vitals Value Taken Time  BP 98/65 10/14/2017 12:39 PM  Temp 36.5 C 10/14/2017 12:39 PM  Pulse 72 10/14/2017 12:42 PM  Resp 12 10/14/2017 12:42 PM  SpO2 100 % 10/14/2017 12:42 PM  Vitals shown include unvalidated device data.  Last Pain:  Vitals:   10/14/17 1239  TempSrc:   PainSc: 0-No pain      Patients Stated Pain Goal: 2 (70/92/95 7473)  Complications: No apparent anesthesia complications

## 2017-10-14 NOTE — Op Note (Signed)
10/14/2017  12:44 PM  PATIENT:  Amy Rocha  62 y.o. 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  PROCEDURE:  Procedure(s): REPAIR OF LEFT GREATER TROCHANTER NONUNION (Left)  SURGEON: Laurene Footman, MD  ASSISTANTS: None  ANESTHESIA:   spinal  EBL:  Total I/O In: 900 [I.V.:900] Out: 450 [Urine:150; Blood:300]  BLOOD ADMINISTERED:none  DRAINS: none   LOCAL MEDICATIONS USED:  NONE  SPECIMEN:  No Specimen  DISPOSITION OF SPECIMEN:  N/A  COUNTS:  YES  TOURNIQUET:  * No tourniquets in log *  IMPLANTS: [employer] trochanteric grip with 2 wires  DICTATION: .Dragon Dictation she was brought the operating room and after adequate anesthesia was obtained patient placed in a semilateral position with the left side up.  After patient identification and timeout procedure after having prepped and draped you sterile manner and using C arm to determine position of the greater trochanter a prior incision was made laterally over the greater trochanter curving anteriorly.  The skin and subcutaneous tissue incised the IT band split with exposure trochanteric nonunion.  The area of the nonunion distally was opened and DBX bone putty was inserted into this area to try to aid in creating union.  Also compartment the case was elevating the tissues anteriorly and posteriorly and plate placing placing 2 wires around the femur getting them distal to the implants at the level of the lesser trochanter and then laterally coming up to the greater trochanter to the trochanteric implant.  After these were passed they were sequentially tightened until there was compression of the trochanter against the lateral aspect of the prior femoral component this was stable through range of motion under fluoroscopy.  When this was  determined to be adequately stabilized the and 10 both wires tensioned the cramping lock was then tightened laterally and this appeared to give stable fixation wires were cut flush and the wound thoroughly irrigated with fibrillar placed posteriorly because of posterior capsular bleeding that could not be controlled otherwise after the fibrillar was placed the bleeding stopped.  The IT band was closed with a running #1 Vicryl, 3-0V-LOC subcutaneously followed by skin staples and incisional wound VAC  PLAN OF CARE: Admit to inpatient   PATIENT DISPOSITION:  PACU - hemodynamically stable.

## 2017-10-14 NOTE — Anesthesia Procedure Notes (Signed)
Date/Time: 10/14/2017 10:52 AM Performed by: Johnna Acosta, CRNA Pre-anesthesia Checklist: Patient identified, Emergency Drugs available, Suction available, Patient being monitored and Timeout performed Patient Re-evaluated:Patient Re-evaluated prior to induction Oxygen Delivery Method: Simple face mask Preoxygenation: Pre-oxygenation with 100% oxygen

## 2017-10-14 NOTE — Anesthesia Procedure Notes (Signed)
Performed by: Johnetta Sloniker, CRNA       

## 2017-10-15 ENCOUNTER — Encounter: Payer: Self-pay | Admitting: Orthopedic Surgery

## 2017-10-15 LAB — BASIC METABOLIC PANEL
ANION GAP: 4 — AB (ref 5–15)
BUN: 12 mg/dL (ref 8–23)
CO2: 23 mmol/L (ref 22–32)
Calcium: 8.2 mg/dL — ABNORMAL LOW (ref 8.9–10.3)
Chloride: 113 mmol/L — ABNORMAL HIGH (ref 98–111)
Creatinine, Ser: 0.81 mg/dL (ref 0.44–1.00)
GFR calc Af Amer: >60 mL/min (ref >60)
GLUCOSE: 133 mg/dL — AB (ref 70–99)
POTASSIUM: 3.8 mmol/L (ref 3.5–5.1)
Sodium: 140 mmol/L (ref 135–145)

## 2017-10-15 LAB — CBC
HEMATOCRIT: 32.9 % — AB (ref 35.0–47.0)
Hemoglobin: 11.3 g/dL — ABNORMAL LOW (ref 12.0–16.0)
MCH: 30.4 pg (ref 26.0–34.0)
MCHC: 34.3 g/dL (ref 32.0–36.0)
MCV: 88.8 fL (ref 80.0–100.0)
PLATELETS: 298 10*3/uL (ref 150–440)
RBC: 3.71 MIL/uL — AB (ref 3.80–5.20)
RDW: 14.8 % — ABNORMAL HIGH (ref 11.5–14.5)
WBC: 10.6 10*3/uL (ref 3.6–11.0)

## 2017-10-15 MED ORDER — OXYCODONE HCL 5 MG PO TABS
20.0000 mg | ORAL_TABLET | ORAL | Status: DC | PRN
  Administered 2017-10-16: 20 mg via ORAL
  Filled 2017-10-15: qty 4

## 2017-10-15 MED ORDER — SODIUM CHLORIDE 0.9 % IV BOLUS
500.0000 mL | Freq: Once | INTRAVENOUS | Status: AC
  Administered 2017-10-15: 500 mL via INTRAVENOUS

## 2017-10-15 NOTE — Progress Notes (Signed)
Patient requesting pain medicine. Pain level rated 6/10.  BP reassessed 87/46.  PA [facility] paged and received verbal orders for 500 ml NS bolus. See MAR Will reassess BP after bolus is completed.

## 2017-10-15 NOTE — Progress Notes (Signed)
Physical Therapy Treatment Patient Details Name: Amy Rocha MRN: [id number] DOB: 25-Apr-1955 Today's Date: 10/15/2017    History of Present Illness Pt is 62 y.o. female who was addmitted to the hospital for s/p ORIF L hip. With PMH to include HTN, CAD, CHF, COPD, chronic pain syndrome, and hyperlipidemia. Pt reports 6 hip surgeries on the same hip, but unclear dates.    PT Comments    Pt is progressing well towards goals today, despite still having increased pain. Pt does state that pain improves once standing and with ambulation. Pt required less assist to get OOB, but still Min A to get into bed. Pt was able to progress ambulation but is limited by pain and mild fatigue. See below for mobility details. Pt only c/o dizziness with ambulation that quickly resolved. Current d/c recommendations remain appropriate and pt should continue to progress well as pain is managed.  Follow Up Recommendations  Home health PT     Equipment Recommendations  Rolling walker with 5" wheels    Recommendations for Other Services       Precautions / Restrictions Precautions Precautions: Fall Restrictions Weight Bearing Restrictions: Yes LLE Weight Bearing: Weight bearing as tolerated    Mobility  Bed Mobility Overal bed mobility: Needs Assistance Bed Mobility: Supine to Sit;Sit to Supine     Supine to sit: Modified independent (Device/Increase time);HOB elevated Sit to supine: Min assist   General bed mobility comments: Pt required Min A at LE 2/2 increased pain. Pt benefited from verbal quieng to improve sequencing. PT will instruct pt next visit to hook LE when getting in bed if still limited by pain  Transfers Overall transfer level: Needs assistance Equipment used: Rolling walker (2 wheeled) Transfers: Sit to/from Stand Sit to Stand: Min guard         General transfer comment: Pt benefited from CGA for unsteadines. Pt denies dizziness with standing  EOB.  Ambulation/Gait Ambulation/Gait assistance: Min guard Gait Distance (Feet): 75 Feet Assistive device: Rolling walker (2 wheeled) Gait Pattern/deviations: Step-through pattern     General Gait Details: Pt able to ambulate 80' with CGA/RW/ 1 standing restbreak 2/2 unsteadiness, fatigue, and mild dizziness reported with ambulation. Pt showed imporved weight bearing through L LE, via decreased flexed hip, knee, and ankle. Pt benefited from mutlimodal cueing to safely use RW. Pt able to increase gait speed and dizziness resolved with standing rest break.   Stairs             Wheelchair Mobility    Modified Rankin (Stroke Patients Only)       Balance Overall balance assessment: Needs assistance Sitting-balance support: No upper extremity supported;Feet supported Sitting balance-Leahy Scale: Good Sitting balance - Comments: pt limiting weight through L hip with slight lean to R.   Standing balance support: No upper extremity supported Standing balance-Leahy Scale: Fair                              Cognition Arousal/Alertness: Awake/alert Behavior During Therapy: WFL for tasks assessed/performed Overall Cognitive Status: Within Functional Limits for tasks assessed                                        Exercises Other Exercises Other Exercises: pt instructed in supine ther.ex to include ankle pumps x10, quad sets x10, glute sets x10, Min A hip abd/add  x10, and SAQ x10 all on L LE.    General Comments        Pertinent Vitals/Pain Pain Assessment: 0-10 Pain Score: 6  Pain Location: L hip Pain Descriptors / Indicators: Guarding;Grimacing Pain Intervention(s): Limited activity within patient's tolerance;Monitored during session;Repositioned    Home Living Family/patient expects to be discharged to:: Private residence Living Arrangements: Spouse/significant other Available Help at Discharge: Family;Available 24 hours/day Type of  Home: House Home Access: Stairs to enter Entrance Stairs-Rails: Left Home Layout: One level Home Equipment: Walker - standard;Cane - single point;Shower seat;Grab bars - tub/shower      Prior Function Level of Independence: Needs assistance  Gait / Transfers Assistance Needed: Pt independent with ambulation and transfers, and has not used AD in a "year or more". ADL's / Homemaking Assistance Needed: Pt independent with ADLs and IADLs but husband supervised her with satirs and provided physcial assist for stairs in community. Comments: Pt states no falls in the last year   PT Goals (current goals can now be found in the care plan section) Acute Rehab PT Goals Patient Stated Goal: go home PT Goal Formulation: With patient Time For Goal Achievement: 10/29/17 Potential to Achieve Goals: Good Progress towards PT goals: Progressing toward goals    Frequency    BID      PT Plan Current plan remains appropriate    Co-evaluation              AM-PAC PT "6 Clicks" Daily Activity  Outcome Measure  Difficulty turning over in bed (including adjusting bedclothes, sheets and blankets)?: Unable Difficulty moving from lying on back to sitting on the side of the bed? : Unable Difficulty sitting down on and standing up from a chair with arms (e.g., wheelchair, bedside commode, etc,.)?: Unable Help needed moving to and from a bed to chair (including a wheelchair)?: A Little Help needed walking in hospital room?: A Little Help needed climbing 3-5 steps with a railing? : A Lot 6 Click Score: 11    End of Session Equipment Utilized During Treatment: Gait belt Activity Tolerance: Patient limited by pain;Patient limited by fatigue Patient left: in bed;with call bell/phone within reach;with bed alarm set;with SCD's reapplied Nurse Communication: Mobility status PT Visit Diagnosis: Unsteadiness on feet (R26.81);Other abnormalities of gait and mobility (R26.89);Muscle weakness (generalized)  (M62.81);Difficulty in walking, not elsewhere classified (R26.2);Pain Pain - Right/Left: Left Pain - part of body: Hip     Time: 9937-1696 PT Time Calculation (min) (ACUTE ONLY): 25 min  Charges:  $Therapeutic Activity: 8-22 mins                     Rosario Adie, SPT    Rosario Adie 10/15/2017, 3:50 PM

## 2017-10-15 NOTE — Progress Notes (Signed)
Clinical Social Worker (CSW) received SNF consult. PT is recommending home health. RN case manager aware of above. Please reconsult if future social work needs arise. CSW signing off.   Kaely Hollan, LCSW [phone] 

## 2017-10-15 NOTE — Progress Notes (Signed)
   Subjective: 1 Day Post-Op Procedure(s) (LRB): REPAIR OF LEFT GREATER TROCHANTER NONUNION (Left) Patient reports pain as moderate, improved from last night Patient is well, and has had no acute complaints or problems Denies any CP, SOB, ABD pain. We will continue therapy today.    Objective: Vital signs in last 24 hours: Temp:  [97.6 F (36.4 C)-98.6 F (37 C)] 98.2 F (36.8 C) (08/28 0730) Pulse Rate:  [50-104] 75 (08/28 0730) Resp:  [9-24] 16 (08/28 0730) BP: (89-121)/(59-84) 91/70 (08/28 0730) SpO2:  [93 %-100 %] 96 % (08/28 0730) Weight:  [69.2 kg] 69.2 kg (08/27 0920)  Intake/Output from previous day: 08/27 0701 - 08/28 0700 In: 2176 [I.V.:2176] Out: 1800 [Urine:1500; Blood:300] Intake/Output this shift: No intake/output data recorded.  Recent Labs    10/15/17 0443  HGB 11.3*   Recent Labs    10/15/17 0443  WBC 10.6  RBC 3.71*  HCT 32.9*  PLT 298   Recent Labs    10/15/17 0443  NA 140  K 3.8  CL 113*  CO2 23  BUN 12  CREATININE 0.81  GLUCOSE 133*  CALCIUM 8.2*   No results for input(s): LABPT, INR in the last 72 hours.  EXAM General - Patient is Alert, Appropriate and Oriented Extremity - Neurovascular intact Sensation intact distally Intact pulses distally Dorsiflexion/Plantar flexion intact No cellulitis present Compartment soft Dressing - dressing C/D/I and no drainage, wound vac intact Motor Function - intact, moving foot and toes well on exam.   Past Medical History:  Diagnosis Date  . Anxiety   . Arthritis   . Asthma   . Avascular necrosis ([place])   . Blood transfusion without reported diagnosis   . Calculus of kidney   . CHF (congestive heart failure) ([place])   . Chronic pain syndrome   . Complication of anesthesia    aspirated in recovery after  hip replacement 1998  . COPD (chronic obstructive pulmonary disease) ([place])   . Depression   . GERD (gastroesophageal reflux disease)   . Heart failure ([place])   . Hyperlipidemia   .  Hypertension   . IBS (irritable bowel syndrome)   . Joint pain   . Osteoporosis   . Oxygen deficiency   . Peripheral vascular disease ([place])   . PONV (postoperative nausea and vomiting)   . Urge incontinence     Assessment/Plan:   1 Day Post-Op Procedure(s) (LRB): REPAIR OF LEFT GREATER TROCHANTER NONUNION (Left) Active Problems:   S/P ORIF (open reduction internal fixation) fracture  Estimated body mass index is 26.18 kg/m as calculated from the following:   Height as of this encounter: 5\' 4"  (1.626 m).   Weight as of this encounter: 69.2 kg. Advance diet Up with therapy  Needs bowel movement Recheck labs in the morning Hypotensive, hold amlodipine Pain moderate, continue with current pain regimen, continue to monitor mental status and blood pressure Care management to assist with discharge  DVT Prophylaxis - Aspirin, TED hose and SCD Weight-Bearing as tolerated to left leg   T. Rachelle Hora, PA-C [place] 10/15/2017, 7:59 AM

## 2017-10-15 NOTE — Anesthesia Postprocedure Evaluation (Signed)
Anesthesia Post Note  Patient: Amy Rocha  Procedure(s) Performed: REPAIR OF LEFT GREATER TROCHANTER NONUNION (Left Leg Upper)  Patient location during evaluation: Other Anesthesia Type: Spinal Level of consciousness: awake and alert Pain management: pain level controlled Vital Signs Assessment: post-procedure vital signs reviewed and stable Respiratory status: spontaneous breathing and respiratory function stable Cardiovascular status: blood pressure returned to baseline and stable Postop Assessment: spinal receding Anesthetic complications: no     Last Vitals:  Vitals:   10/15/17 0857 10/15/17 0900  BP: (!) 87/46 (!) 89/58  Pulse: 78 83  Resp:    Temp:    SpO2:      Last Pain:  Vitals:   10/15/17 0730  TempSrc: Oral  PainSc:                  Alison Stalling

## 2017-10-15 NOTE — Evaluation (Signed)
Physical Therapy Evaluation Patient Details Name: Amy Rocha MRN: [id number] DOB: March 03, 1955 Today's Date: 10/15/2017   History of Present Illness  Pt is 62 y.o. female who was addmitted to the hospital for s/p ORIF L hip. With PMH to include HTN, CAD, CHF, COPD, chronic pain syndrome, and hyperlipidemia. Pt reports 6 hip surgeries on the same hip, but unclear dates.  Clinical Impression  Pt is a pleasant 62 year old female who was admitted for s/p ORIF L hip with PMH listed above. Pt is very pain limited this session. PT monitored BP throughout session: supine(104/63), sitting(116/73), and standing(124/65), and pt reports only short mild bouts of dizzines that resolved quickly. Pt performs bed mobility with Min A, transfers with CGA, and ambulates with CGA, see below for mobility details. Pt demonstrates deficits with functional mobility 2/2 pain, weakness, dizziness, fatigue, and unsteadiness. Pt Would benefit from skilled PT to address above deficits and promote optimal return to PLOF. PT recommends d/c to home with home health PT to work on above stated deficits.    Follow Up Recommendations Home health PT    Equipment Recommendations  Rolling walker with 5" wheels    Recommendations for Other Services       Precautions / Restrictions Precautions Precautions: Fall Restrictions Weight Bearing Restrictions: Yes LLE Weight Bearing: Weight bearing as tolerated      Mobility  Bed Mobility Overal bed mobility: Needs Assistance Bed Mobility: Supine to Sit     Supine to sit: Min assist     General bed mobility comments: Pt required Min A at LE 2/2 increased pain. Pt benefited from verbal quieng to improve sequencing.  Transfers Overall transfer level: Needs assistance Equipment used: Rolling walker (2 wheeled) Transfers: Sit to/from Stand Sit to Stand: Min guard         General transfer comment: Pt benefited from CGA for unsteadiness and slight dizziness that improved  with standing.  Ambulation/Gait Ambulation/Gait assistance: Min guard Gait Distance (Feet): 10 Feet Assistive device: Rolling walker (2 wheeled) Gait Pattern/deviations: Step-to pattern     General Gait Details: Pt able to ambulate 10' in room with CGA/RW 2/2 unsteadiness, mild dizziness reported with standing, and diminshed balance. Pt limited weight through L LE, PT noted slighly flexed hip, knee, and ankle. Pt benefited from mutlimodal cueing to safely use RW.  Stairs            Wheelchair Mobility    Modified Rankin (Stroke Patients Only)       Balance Overall balance assessment: Needs assistance Sitting-balance support: No upper extremity supported;Feet supported Sitting balance-Leahy Scale: Good Sitting balance - Comments: pt limiting weight through L hip with slight lean to R.   Standing balance support: Bilateral upper extremity supported Standing balance-Leahy Scale: Fair                               Pertinent Vitals/Pain Pain Assessment: 0-10 Pain Score: 8  Pain Location: L hip Pain Descriptors / Indicators: Guarding;Grimacing Pain Intervention(s): Limited activity within patient's tolerance;Monitored during session;Repositioned    Home Living Family/patient expects to be discharged to:: Private residence Living Arrangements: Spouse/significant other Available Help at Discharge: Family;Available 24 hours/day Type of Home: House Home Access: Stairs to enter Entrance Stairs-Rails: Left Entrance Stairs-Number of Steps: 1 Home Layout: One level Home Equipment: Walker - standard;Cane - single point;Shower seat;Grab bars - tub/shower      Prior Function Level of Independence: Needs assistance  Gait / Transfers Assistance Needed: Pt independent with ambulation and transfers, and has not used AD in a "year or more".  ADL's / Homemaking Assistance Needed: Pt independent with ADLs and IADLs but husband supervised her with satirs and provided  physcial assist for stairs in community.  Comments: Pt states no falls in the last year     Hand Dominance   Dominant Hand: Right    Extremity/Trunk Assessment   Upper Extremity Assessment Upper Extremity Assessment: Overall WFL for tasks assessed    Lower Extremity Assessment Lower Extremity Assessment: Generalized weakness(L LE grossly 3-/5 MMT)       Communication   Communication: No difficulties  Cognition Arousal/Alertness: Awake/alert Behavior During Therapy: WFL for tasks assessed/performed Overall Cognitive Status: Within Functional Limits for tasks assessed                                        General Comments      Exercises Other Exercises Other Exercises: PT provided CGA and cueing to safely ambulate pt 4' to [facility]. PT monitored BP/HR/O2 saturation and they were [facility].   Assessment/Plan    PT Assessment Patient needs continued PT services  PT Problem List Decreased strength;Decreased range of motion;Decreased activity tolerance;Decreased balance;Decreased mobility;Decreased coordination;Decreased cognition;Pain       PT Treatment Interventions DME instruction;Gait training;Stair training;Functional mobility training;Therapeutic activities;Therapeutic exercise;Balance training;Neuromuscular re-education;Patient/family education    PT Goals (Current goals can be found in the Care Plan section)  Acute Rehab PT Goals Patient Stated Goal: go home PT Goal Formulation: With patient Time For Goal Achievement: 10/29/17 Potential to Achieve Goals: Good    Frequency BID   Barriers to discharge        Co-evaluation               AM-PAC PT "6 Clicks" Daily Activity  Outcome Measure Difficulty turning over in bed (including adjusting bedclothes, sheets and blankets)?: Unable Difficulty moving from lying on back to sitting on the side of the bed? : Unable Difficulty sitting down on and standing up from a chair with arms (e.g., wheelchair,  bedside commode, etc,.)?: Unable Help needed moving to and from a bed to chair (including a wheelchair)?: A Little Help needed walking in hospital room?: A Little Help needed climbing 3-5 steps with a railing? : A Lot 6 Click Score: 11    End of Session Equipment Utilized During Treatment: Gait belt Activity Tolerance: Patient limited by pain;Patient limited by fatigue Patient left: in chair;with call bell/phone within reach;with chair alarm set;with family/visitor present Nurse Communication: Mobility status;Other (comment)(BPs) PT Visit Diagnosis: Unsteadiness on feet (R26.81);Other abnormalities of gait and mobility (R26.89);Muscle weakness (generalized) (M62.81);Difficulty in walking, not elsewhere classified (R26.2);Pain Pain - Right/Left: Left Pain - part of body: Hip    Time: 1024-1050 PT Time Calculation (min) (ACUTE ONLY): 26 min   Charges:              Rosario Adie, SPT   Rosario Adie 10/15/2017, 2:52 PM

## 2017-10-16 ENCOUNTER — Telehealth: Payer: Self-pay | Admitting: Orthopedic Surgery

## 2017-10-16 DIAGNOSIS — Z419 Encounter for procedure for purposes other than remedying health state, unspecified: Secondary | ICD-10-CM

## 2017-10-16 LAB — CBC
HEMATOCRIT: 29.5 % — AB (ref 35.0–47.0)
Hemoglobin: 10.1 g/dL — ABNORMAL LOW (ref 12.0–16.0)
MCH: 30.4 pg (ref 26.0–34.0)
MCHC: 34.1 g/dL (ref 32.0–36.0)
MCV: 89 fL (ref 80.0–100.0)
Platelets: 260 10*3/uL (ref 150–440)
RBC: 3.32 MIL/uL — ABNORMAL LOW (ref 3.80–5.20)
RDW: 14.7 % — ABNORMAL HIGH (ref 11.5–14.5)
WBC: 12.9 10*3/uL — ABNORMAL HIGH (ref 3.6–11.0)

## 2017-10-16 LAB — BASIC METABOLIC PANEL
Anion gap: 7 (ref 5–15)
BUN: 9 mg/dL (ref 8–23)
CHLORIDE: 114 mmol/L — AB (ref 98–111)
CO2: 21 mmol/L — ABNORMAL LOW (ref 22–32)
Calcium: 7.8 mg/dL — ABNORMAL LOW (ref 8.9–10.3)
Creatinine, Ser: 0.66 mg/dL (ref 0.44–1.00)
GFR calc Af Amer: >60 mL/min (ref >60)
GFR calc non Af Amer: >60 mL/min (ref >60)
GLUCOSE: 133 mg/dL — AB (ref 70–99)
POTASSIUM: 3.7 mmol/L (ref 3.5–5.1)
Sodium: 142 mmol/L (ref 135–145)

## 2017-10-16 MED ORDER — ASPIRIN 325 MG PO TBEC: 325.0000 mg | DELAYED_RELEASE_TABLET | Freq: Every day | ORAL | 0 refills | Status: AC

## 2017-10-16 MED ORDER — METHOCARBAMOL 500 MG PO TABS: 500.0000 mg | ORAL_TABLET | Freq: Four times a day (QID) | ORAL | 0 refills | Status: DC | PRN

## 2017-10-16 NOTE — Progress Notes (Addendum)
Physical Therapy Treatment Patient Details Name: Amy Rocha MRN: [id number] DOB: 1955/10/23 Today's Date: 10/16/2017    History of Present Illness Pt is 62 y.o. female who was addmitted to the hospital for s/p ORIF L hip. With PMH to include HTN, CAD, CHF, COPD, chronic pain syndrome, and hyperlipidemia. Pt reports 6 hip surgeries on the same hip, but unclear dates.    PT Comments    Pt ready for session.  Min assist in and out of bed this am due to pain.  Stood with min guard and was able to complete lap around unit with walker and min verbal cues for safety and decreased speed.  Participated in exercises as described below.    Pt refused stair training.  Stated she has one small step onto porch which she has navigated for years with her walker and feels comfortable doing so.  She was encouraged again but refused.  Pt has stnd walker at home (+64 years old) and requesting rolling walker.   Follow Up Recommendations  Home health PT;Supervision for mobility/OOB     Equipment Recommendations  Rolling walker with 5" wheels    Recommendations for Other Services       Precautions / Restrictions Precautions Precautions: Fall Restrictions Weight Bearing Restrictions: Yes LLE Weight Bearing: Weight bearing as tolerated    Mobility  Bed Mobility Overal bed mobility: Needs Assistance Bed Mobility: Supine to Sit;Sit to Supine     Supine to sit: Min assist Sit to supine: Min assist   General bed mobility comments: assist today due to pain  Transfers Overall transfer level: Needs assistance Equipment used: Rolling walker (2 wheeled) Transfers: Sit to/from Stand Sit to Stand: Min guard            Ambulation/Gait Ambulation/Gait assistance: Min guard Gait Distance (Feet): 200 Feet Assistive device: Rolling walker (2 wheeled) Gait Pattern/deviations: Step-through pattern   Gait velocity interpretation: 1.31 - 2.62 ft/sec, indicative of limited community  ambulator General Gait Details: verbal cues for safety and to slow down.  no dizziness noted today.  refused stair training.     Stairs             Wheelchair Mobility    Modified Rankin (Stroke Patients Only)       Balance Overall balance assessment: Needs assistance Sitting-balance support: No upper extremity supported;Feet supported Sitting balance-Leahy Scale: Good     Standing balance support: Bilateral upper extremity supported Standing balance-Leahy Scale: Fair                              Cognition Arousal/Alertness: Awake/alert Behavior During Therapy: WFL for tasks assessed/performed Overall Cognitive Status: Within Functional Limits for tasks assessed                                        Exercises Other Exercises Other Exercises: pt instructed in supine ther.ex to include ankle pumps x10, quad sets x10, glute sets x10, Min A hip abd/add x10, and SAQ x10 all on L LE.    General Comments        Pertinent Vitals/Pain Pain Assessment: 0-10 Pain Score: 6  Pain Location: L hip Pain Descriptors / Indicators: Guarding;Grimacing Pain Intervention(s): Limited activity within patient's tolerance;Monitored during session    Home Living  Prior Function            PT Goals (current goals can now be found in the care plan section) Progress towards PT goals: Progressing toward goals    Frequency    BID      PT Plan Current plan remains appropriate    Co-evaluation              AM-PAC PT "6 Clicks" Daily Activity  Outcome Measure  Difficulty turning over in bed (including adjusting bedclothes, sheets and blankets)?: A Little Difficulty moving from lying on back to sitting on the side of the bed? : Unable Difficulty sitting down on and standing up from a chair with arms (e.g., wheelchair, bedside commode, etc,.)?: Unable Help needed moving to and from a bed to chair (including a  wheelchair)?: A Little Help needed walking in hospital room?: A Little Help needed climbing 3-5 steps with a railing? : A Little 6 Click Score: 14    End of Session Equipment Utilized During Treatment: Gait belt Activity Tolerance: Patient tolerated treatment well Patient left: in bed;with bed alarm set;with call bell/phone within reach   Pain - Right/Left: Left Pain - part of body: Hip     Time: 4481-8563 PT Time Calculation (min) (ACUTE ONLY): 19 min  Charges:  $Gait Training: 8-22 mins                     Chesley Noon, PTA 10/16/17, 9:59 AM

## 2017-10-16 NOTE — Progress Notes (Addendum)
Per RN patient is ready to discharge home this morning. Clinical Education officer, museum (CSW) met with patient and offered her home health agency choice. She chose [facility]. Gilford Rile was delivered to the room by [place] representative. Per RN patient will discharge home on aspirin. CSW notified Helene Kelp [facility] representative of discharge today. CSW paged Gerald Stabs PA and asked him to put in orders for home health PT and a walker. Please reconsult if future social work needs arise. CSW signing off.   [employer], LCSW [phone]

## 2017-10-16 NOTE — Discharge Instructions (Signed)
POSTERIOR TOTAL HIP REPLACEMENT POSTOPERATIVE DIRECTIONS  Hip Rehabilitation, Guidelines Following Surgery  The results of a hip operation are greatly improved after range of motion and muscle strengthening exercises. Follow all safety measures which are given to protect your hip. If any of these exercises cause increased pain or swelling in your joint, decrease the amount until you are comfortable again. Then slowly increase the exercises. Call your caregiver if you have problems or questions.   HOME CARE INSTRUCTIONS  Remove items at home which could result in a fall. This includes throw rugs or furniture in walking pathways.   ICE to the affected hip every three hours for 30 minutes at a time and then as needed for pain and swelling.  Continue to use ice on the hip for pain and swelling from surgery. You may notice swelling that will progress down to the foot and ankle.  This is normal after surgery.  Elevate the leg when you are not up walking on it.    Continue to use the breathing machine which will help keep your temperature down.  It is common for your temperature to cycle up and down following surgery, especially at night when you are not up moving around and exerting yourself.  The breathing machine keeps your lungs expanded and your temperature down.  DIET You may resume your previous home diet once your are discharged from the hospital.  DRESSING / WOUND CARE / SHOWERING Please remove provena negative pressure dressing on 10/23/2017 and apply honey comb dressing. Keep dressing clean and dry at all times.     ACTIVITY Walk with your walker as instructed. Use walker as long as suggested by your caregivers. Avoid periods of inactivity such as sitting longer than an hour when not asleep. This helps prevent blood clots.  You may resume a sexual relationship in one month or when given the OK by your doctor.  You may return to work once you are cleared by your doctor.  Do not drive  a car for 6 weeks or until released by you surgeon.  Do not drive while taking narcotics.  WEIGHT BEARING Weight bearing as tolerated  POSTOPERATIVE CONSTIPATION PROTOCOL Constipation - defined medically as fewer than three stools per week and severe constipation as less than one stool per week.  One of the most common issues patients have following surgery is constipation.  Even if you have a regular bowel pattern at home, your normal regimen is likely to be disrupted due to multiple reasons following surgery.  Combination of anesthesia, postoperative narcotics, change in appetite and fluid intake all can affect your bowels.  In order to avoid complications following surgery, here are some recommendations in order to help you during your recovery period.  Colace (docusate) - Pick up an over-the-counter form of Colace or another stool softener and take twice a day as long as you are requiring postoperative pain medications.  Take with a full glass of water daily.  If you experience loose stools or diarrhea, hold the colace until you stool forms back up.  If your symptoms do not get better within 1 week or if they get worse, check with your doctor.  Dulcolax (bisacodyl) - Pick up over-the-counter and take as directed by the product packaging as needed to assist with the movement of your bowels.  Take with a full glass of water.  Use this product as needed if not relieved by Colace only.   MiraLax (polyethylene glycol) - Pick up over-the-counter  to have on hand.  MiraLax is a solution that will increase the amount of water in your bowels to assist with bowel movements.  Take as directed and can mix with a glass of water, juice, soda, coffee, or tea.  Take if you go more than two days without a movement. Do not use MiraLax more than once per day. Call your doctor if you are still constipated or irregular after using this medication for 7 days in a row.  If you continue to have problems with  postoperative constipation, please contact the office for further assistance and recommendations.  If you experience "the worst abdominal pain ever" or develop nausea or vomiting, please contact the office immediatly for further recommendations for treatment.  ITCHING  If you experience itching with your medications, try taking only a single pain pill, or even half a pain pill at a time.  You can also use Benadryl over the counter for itching or also to help with sleep.   TED HOSE STOCKINGS Wear the elastic stockings on both legs for six weeks following surgery during the day but you may remove then at night for sleeping.  MEDICATIONS See your medication summary on the After Visit Summary that the nursing staff will review with you prior to discharge.  You may have some home medications which will be placed on hold until you complete the course of blood thinner medication.  It is important for you to complete the blood thinner medication as prescribed by your surgeon.  Continue your approved medications as instructed at time of discharge.  PRECAUTIONS If you experience chest pain or shortness of breath - call 911 immediately for transfer to the hospital emergency department.  If you develop a fever greater that 101 F, purulent drainage from wound, increased redness or drainage from wound, foul odor from the wound/dressing, or calf pain - CONTACT YOUR SURGEON.                                                   FOLLOW-UP APPOINTMENTS Make sure you keep all of your appointments after your operation with your surgeon and caregivers. You should call the office at the above phone number and make an appointment for approximately two weeks after the date of your surgery or on the date instructed by your surgeon outlined in the "After Visit Summary".  RANGE OF MOTION AND STRENGTHENING EXERCISES  These exercises are designed to help you keep full movement of your hip joint. Follow your caregiver's or  physical therapist's instructions. Perform all exercises about fifteen times, three times per day or as directed. Exercise both hips, even if you have had only one joint replacement. These exercises can be done on a training (exercise) mat, on the floor, on a table or on a bed. Use whatever works the best and is most comfortable for you. Use music or television while you are exercising so that the exercises are a pleasant break in your day. This will make your life better with the exercises acting as a break in routine you can look forward to.  Lying on your back, slowly slide your foot toward your buttocks, raising your knee up off the floor. Then slowly slide your foot back down until your leg is straight again.  Lying on your back spread your legs as far apart as you  can without causing discomfort.  Lying on your side, raise your upper leg and foot straight up from the floor as far as is comfortable. Slowly lower the leg and repeat.  Lying on your back, tighten up the muscle in the front of your thigh (quadriceps muscles). You can do this by keeping your leg straight and trying to raise your heel off the floor. This helps strengthen the largest muscle supporting your knee.  Lying on your back, tighten up the muscles of your buttocks both with the legs straight and with the knee bent at a comfortable angle while keeping your heel on the floor.      IF YOU ARE TRANSFERRED TO A SKILLED REHAB FACILITY If the patient is transferred to a skilled rehab facility following release from the hospital, a list of the current medications will be sent to the facility for the patient to continue.  When discharged from the skilled rehab facility, please have the facility set up the patient's [place] prior to being released. Also, the skilled facility will be responsible for providing the patient with their medications at time of release from the facility to include their pain medication, the muscle  relaxants, and their blood thinner medication. If the patient is still at the rehab facility at time of the two week follow up appointment, the skilled rehab facility will also need to assist the patient in arranging follow up appointment in our office and any transportation needs.  MAKE SURE YOU:  Understand these instructions.  Get help right away if you are not doing well or get worse.    Pick up stool softner and laxative for home use following surgery while on pain medications. Continue to use ice for pain and swelling after surgery. Do not use any lotions or creams on the incision until instructed by your surgeon.

## 2017-10-16 NOTE — Discharge Summary (Signed)
Physician Discharge Summary  Patient ID: Amy Rocha MRN: [id number] DOB/AGE: May 01, 1955 62 y.o.  Admit date: 10/14/2017 Discharge date: 10/16/2017  Admission Diagnoses:  trochanterclosed displaced fracture of greater trochanter of left femur   Discharge Diagnoses: Patient Active Problem List   Diagnosis Date Noted  . S/P ORIF (open reduction internal fixation) fracture 10/14/2017  . Failed total hip arthroplasty ([place]) 01/21/2017  . Hypertension 09/25/2016  . Oral-nasal fistula 02/06/2016  . Atherosclerotic peripheral vascular disease ([place]) 09/21/2015  . Insomnia 09/06/2015  . Complex regional pain syndrome of lower extremity 07/11/2015  . Bilateral occipital neuralgia 05/10/2015  . DDD (degenerative disc disease), lumbar 10/26/2014  . Facet syndrome, lumbar 10/26/2014  . History of total hip replacement 10/26/2014  . Avascular necrosis of bones of both hips ([place]) 10/26/2014  . Sacroiliac joint dysfunction 10/26/2014  . Calculus of kidney 09/08/2014  . Acute anxiety 09/08/2014  . GERD (gastroesophageal reflux disease) 09/08/2014  . Joint pain of leg 09/08/2014  . Urge incontinence 09/08/2014  . Hypertensive heart failure ([place]) 09/08/2014  . Avascular necrosis ([place]) 09/08/2014  . Anemia 09/08/2014  . Atherosclerosis of coronary artery 09/08/2014  . IBS (irritable bowel syndrome) 09/08/2014  . Asthma 09/08/2014  . CHF (congestive heart failure) ([place]) 09/08/2014  . Hyperlipidemia 09/08/2014  . COPD (chronic obstructive pulmonary disease) ([place]) 09/08/2014  . Depression 09/08/2014  . Hypertensive CKD (chronic kidney disease) 09/08/2014  . CKD (chronic kidney disease), stage IV ([place]) 09/08/2014  . Proteinuria 09/08/2014  . Chronic pain 09/08/2014    Past Medical History:  Diagnosis Date  . Anxiety   . Arthritis   . Asthma   . Avascular necrosis ([place])   . Blood transfusion without reported diagnosis   . Calculus of kidney   . CHF (congestive heart failure) ([place])    . Chronic pain syndrome   . Complication of anesthesia    aspirated in recovery after  hip replacement 1998  . COPD (chronic obstructive pulmonary disease) ([place])   . Depression   . GERD (gastroesophageal reflux disease)   . Heart failure ([place])   . Hyperlipidemia   . Hypertension   . IBS (irritable bowel syndrome)   . Joint pain   . Osteoporosis   . Oxygen deficiency   . Peripheral vascular disease ([place])   . PONV (postoperative nausea and vomiting)   . Urge incontinence      Transfusion: none   Consultants (if any):   Discharged Condition: Improved  Hospital Course: IZORA BENN is an 62 y.o. female who was admitted 10/14/2017 with a diagnosis of nonunion greater trochanteric displaced fracture.  And went to the operating room on 10/14/2017 and underwent the above named procedures.    Surgeries: Procedure(s): REPAIR OF LEFT GREATER TROCHANTER NONUNION on 10/14/2017 Patient tolerated the surgery well. Taken to PACU where she was stabilized and then transferred to the orthopedic floor.  Started on aspirin 325 mg daily, SCDs, TED hose. Heels elevated on bed with rolled towels. No evidence of DVT. Negative [place]. Physical therapy started on day #1 for gait training and transfer. OT started day #1 for ADL and assisted devices.  Patient's foley was d/c on day #1. Patient's IV  d/c on day #2.  On post op day #2 patient was stable and ready for discharge to home with home health PT.  Implants: [employer] trochanteric grip with 2 wires  She was given perioperative antibiotics:  Anti-infectives (From admission, onward)   Start     Dose/Rate Route Frequency  Ordered Stop   10/14/17 1400  valACYclovir (VALTREX) tablet 2,000 mg     2,000 mg Oral 2 times daily PRN 10/14/17 1401     10/13/17 2145  ceFAZolin (ANCEF) IVPB 2g/100 mL premix     2 g 200 mL/hr over 30 Minutes Intravenous  Once 10/13/17 2135 10/14/17 1111    .  She was given sequential compression devices, early  ambulation, and aspirin for DVT prophylaxis.  She benefited maximally from the hospital stay and there were no complications.    Recent vital signs:  Vitals:   10/15/17 2306 10/16/17 0726  BP: (!) 109/54 96/63  Pulse: 95 85  Resp: 18 17  Temp: 98 F (36.7 C) 98.1 F (36.7 C)  SpO2: 100%     Recent laboratory studies:  Lab Results  Component Value Date   HGB 10.1 (L) 10/16/2017   HGB 11.3 (L) 10/15/2017   HGB 14.3 10/07/2017   Lab Results  Component Value Date   WBC 12.9 (H) 10/16/2017   PLT 260 10/16/2017   Lab Results  Component Value Date   INR 0.89 01/03/2017   Lab Results  Component Value Date   NA 142 10/16/2017   K 3.7 10/16/2017   CL 114 (H) 10/16/2017   CO2 21 (L) 10/16/2017   BUN 9 10/16/2017   CREATININE 0.66 10/16/2017   GLUCOSE 133 (H) 10/16/2017    Discharge Medications:   Allergies as of 10/16/2017      Reactions   Fluticasone-salmeterol Swelling, Other (See Comments)   Tongue sores   Lactose Intolerance (gi) Other (See Comments)   Bloating, GI upset, diarrhea   Tetracyclines & Related Other (See Comments)   Went in to the sun and broke out      Medication List    TAKE these medications   acetaminophen 500 MG tablet Commonly known as:  TYLENOL Take 1,000 mg by mouth 2 (two) times daily as needed for moderate pain or headache.   albuterol 108 (90 Base) MCG/ACT inhaler Commonly known as:  PROVENTIL HFA;VENTOLIN HFA Inhale 2 puffs into the lungs every 6 (six) hours as needed for wheezing or shortness of breath.   amLODipine 5 MG tablet Commonly known as:  NORVASC Take 1 tablet (5 mg total) by mouth daily. What changed:  when to take this   aspirin 325 MG EC tablet Take 1 tablet (325 mg total) by mouth daily with breakfast for 28 days.   atorvastatin 40 MG tablet Commonly known as:  LIPITOR Take 1 tablet (40 mg total) by mouth at bedtime.   budesonide-formoterol 160-4.5 MCG/ACT inhaler Commonly known as:  SYMBICORT Inhale 2 puffs  into the lungs 2 (two) times daily. What changed:    when to take this  reasons to take this   cromolyn 4 % ophthalmic solution Commonly known as:  OPTICROM Place 1 drop into both eyes 4 (four) times daily.   dicyclomine 20 MG tablet Commonly known as:  BENTYL Take 1 tablet (20 mg total) by mouth every 6 (six) hours.   fluticasone 50 MCG/ACT nasal spray Commonly known as:  FLONASE Place 2 sprays into both nostrils daily. What changed:    when to take this  reasons to take this   loratadine 10 MG tablet Commonly known as:  CLARITIN Take 10 mg by mouth daily as needed for allergies.   magnesium hydroxide 400 MG/5ML suspension Commonly known as:  MILK OF MAGNESIA Take 30 mLs by mouth daily as needed for mild constipation.  methocarbamol 500 MG tablet Commonly known as:  ROBAXIN Take 1 tablet (500 mg total) by mouth every 6 (six) hours as needed for muscle spasms.   NARCAN 4 MG/0.1ML Liqd nasal spray kit Generic drug:  naloxone Place 1 spray into the nose once.   omeprazole 20 MG capsule Commonly known as:  PRILOSEC TAKE 1 CAPSULE BY MOUTH DAILY. What changed:  when to take this   ondansetron 4 MG disintegrating tablet Commonly known as:  ZOFRAN-ODT Take 4 mg every 8 (eight) hours as needed by mouth for nausea or vomiting.   Oxycodone HCl 20 MG Tabs Take 20 mg by mouth every 4 (four) hours as needed (pain).   valACYclovir 500 MG tablet Commonly known as:  VALTREX Take 4 tablets (2,000 mg total) by mouth 2 (two) times daily as needed. What changed:  reasons to take this   vortioxetine HBr 20 MG Tabs tablet Commonly known as:  TRINTELLIX Take 20 mg by mouth daily. What changed:  when to take this       Diagnostic Studies: Dg C-arm 1-60 Min  Result Date: 10/14/2017 CLINICAL DATA:  Left greater trochanter nonunion. EXAM: LEFT FEMUR 2 VIEWS; DG C-ARM 61-120 MIN COMPARISON:  CT 12/11/2016. FINDINGS: Plate wire fixation left greater trochanter. Total left hip  replacement. Hardware intact. Two images obtained. 1 minutes 36 seconds fluoroscopy time utilized. IMPRESSION: Postsurgical changes left hip. Electronically Signed   By: Marcello Moores  Register   On: 10/14/2017 12:40   Dg Hip Unilat W Or W/o Pelvis 2-3 Views Left  Result Date: 10/14/2017 CLINICAL DATA:  Left hip post op pain EXAM: DG HIP (WITH OR WITHOUT PELVIS) 2-3V LEFT COMPARISON:  01/21/2017 FINDINGS: Remote LEFT hip total arthroplasty. Cerclage wires encircle the mid shaft of the femur, unchanged in appearance. There has been interval screw plate fixation in the region of the greater trochanter, combined with cerclage wires of the intertrochanteric region and femoral neck. Expected postoperative gas and surgical clips are identified LATERAL to the hip. Remote fractures of the LEFT inferior pubic ramus and possible remote fracture of the LEFT superior pubic ramus. IMPRESSION: Postoperative changes following ORIF of the LEFT greater trochanter region. Electronically Signed   By: Nolon Nations M.D.   On: 10/14/2017 13:30   Dg Femur Min 2 Views Left  Result Date: 10/14/2017 CLINICAL DATA:  Left greater trochanter nonunion. EXAM: LEFT FEMUR 2 VIEWS; DG C-ARM 61-120 MIN COMPARISON:  CT 12/11/2016. FINDINGS: Plate wire fixation left greater trochanter. Total left hip replacement. Hardware intact. Two images obtained. 1 minutes 36 seconds fluoroscopy time utilized. IMPRESSION: Postsurgical changes left hip. Electronically Signed   By: [place]   On: 10/14/2017 12:40    Disposition:     Follow-up Information    Hessie Knows, MD Follow up in 2 week(s).   Specialty:  Orthopedic Surgery Why:  Staple removal and Steri-Strip application. Contact information: [place] [address] [phone]            Signed: [place] 10/16/2017, 8:09 AM

## 2017-10-16 NOTE — Progress Notes (Signed)
   Subjective: 2 Days Post-Op Procedure(s) (LRB): REPAIR OF LEFT GREATER TROCHANTER NONUNION (Left) Patient reports pain as mild, improved from yesterday Patient is well, and has had no acute complaints or problems Denies any CP, SOB, ABD pain. We will continue therapy today.    Objective: Vital signs in last 24 hours: Temp:  [98 F (36.7 C)-98.1 F (36.7 C)] 98.1 F (36.7 C) (08/29 0726) Pulse Rate:  [64-97] 85 (08/29 0726) Resp:  [17-20] 17 (08/29 0726) BP: (87-124)/(46-73) 96/63 (08/29 0726) SpO2:  [99 %-100 %] 100 % (08/28 2306)  Intake/Output from previous day: 08/28 0701 - 08/29 0700 In: 1580.8 [I.V.:1580.8] Out: 1025 [Urine:1025] Intake/Output this shift: No intake/output data recorded.  Recent Labs    10/15/17 0443 10/16/17 0349  HGB 11.3* 10.1*   Recent Labs    10/15/17 0443 10/16/17 0349  WBC 10.6 12.9*  RBC 3.71* 3.32*  HCT 32.9* 29.5*  PLT 298 260   Recent Labs    10/15/17 0443 10/16/17 0349  NA 140 142  K 3.8 3.7  CL 113* 114*  CO2 23 21*  BUN 12 9  CREATININE 0.81 0.66  GLUCOSE 133* 133*  CALCIUM 8.2* 7.8*   No results for input(s): LABPT, INR in the last 72 hours.  EXAM General - Patient is Alert, Appropriate and Oriented Extremity - Neurovascular intact Sensation intact distally Intact pulses distally Dorsiflexion/Plantar flexion intact No cellulitis present Compartment soft Dressing - dressing C/D/I and no drainage, wound vac intact Motor Function - intact, moving foot and toes well on exam.   Past Medical History:  Diagnosis Date  . Anxiety   . Arthritis   . Asthma   . Avascular necrosis ([place])   . Blood transfusion without reported diagnosis   . Calculus of kidney   . CHF (congestive heart failure) ([place])   . Chronic pain syndrome   . Complication of anesthesia    aspirated in recovery after  hip replacement 1998  . COPD (chronic obstructive pulmonary disease) ([place])   . Depression   . GERD (gastroesophageal reflux  disease)   . Heart failure ([place])   . Hyperlipidemia   . Hypertension   . IBS (irritable bowel syndrome)   . Joint pain   . Osteoporosis   . Oxygen deficiency   . Peripheral vascular disease ([place])   . PONV (postoperative nausea and vomiting)   . Urge incontinence     Assessment/Plan:   2 Days Post-Op Procedure(s) (LRB): REPAIR OF LEFT GREATER TROCHANTER NONUNION (Left) Active Problems:   S/P ORIF (open reduction internal fixation) fracture  Estimated body mass index is 26.18 kg/m as calculated from the following:   Height as of this encounter: 5\' 4"  (1.626 m).   Weight as of this encounter: 69.2 kg. Advance diet Up with therapy  Acute post op blood loss anemia -hemoglobin stable at 10.1. Blood pressure soft but stable, patient asymptomatic.  Hold blood pressure medicine this morning. Continue with current pain regimen Care management to assist with discharge to home with home health PT today pending progress with physical therapy and completion of PT goals.  DVT Prophylaxis - Aspirin, TED hose and SCD Weight-Bearing as tolerated to left leg   T. Rachelle Hora, PA-C [place] 10/16/2017, 8:02 AM

## 2017-10-16 NOTE — Progress Notes (Signed)
Pt axox4, vital signs stable. Discharge education provided, pt verbalized understanding. PIV removed. Pt wheeled down to visitor's entranced. No acute distress.

## 2017-10-22 ENCOUNTER — Other Ambulatory Visit: Payer: Self-pay

## 2017-10-22 DIAGNOSIS — M4726 Other spondylosis with radiculopathy, lumbar region: Secondary | ICD-10-CM | POA: Diagnosis not present

## 2017-10-22 DIAGNOSIS — J449 Chronic obstructive pulmonary disease, unspecified: Secondary | ICD-10-CM | POA: Diagnosis not present

## 2017-10-22 DIAGNOSIS — M5136 Other intervertebral disc degeneration, lumbar region: Secondary | ICD-10-CM | POA: Diagnosis not present

## 2017-10-22 DIAGNOSIS — I509 Heart failure, unspecified: Secondary | ICD-10-CM | POA: Diagnosis not present

## 2017-10-22 DIAGNOSIS — I2510 Atherosclerotic heart disease of native coronary artery without angina pectoris: Secondary | ICD-10-CM | POA: Diagnosis not present

## 2017-10-22 DIAGNOSIS — I130 Hypertensive heart and chronic kidney disease with heart failure and stage 1 through stage 4 chronic kidney disease, or unspecified chronic kidney disease: Secondary | ICD-10-CM | POA: Diagnosis not present

## 2017-10-22 DIAGNOSIS — I739 Peripheral vascular disease, unspecified: Secondary | ICD-10-CM | POA: Diagnosis not present

## 2017-10-22 DIAGNOSIS — Z96643 Presence of artificial hip joint, bilateral: Secondary | ICD-10-CM | POA: Diagnosis not present

## 2017-10-22 DIAGNOSIS — G894 Chronic pain syndrome: Secondary | ICD-10-CM | POA: Diagnosis not present

## 2017-10-22 DIAGNOSIS — M80052K Age-related osteoporosis with current pathological fracture, left femur, subsequent encounter for fracture with nonunion: Secondary | ICD-10-CM | POA: Diagnosis not present

## 2017-10-22 DIAGNOSIS — N184 Chronic kidney disease, stage 4 (severe): Secondary | ICD-10-CM | POA: Diagnosis not present

## 2017-10-22 DIAGNOSIS — Z9181 History of falling: Secondary | ICD-10-CM | POA: Diagnosis not present

## 2017-10-22 DIAGNOSIS — M1991 Primary osteoarthritis, unspecified site: Secondary | ICD-10-CM | POA: Diagnosis not present

## 2017-10-22 DIAGNOSIS — D631 Anemia in chronic kidney disease: Secondary | ICD-10-CM | POA: Diagnosis not present

## 2017-10-22 NOTE — Patient Outreach (Signed)
[facility]) Care Management  10/22/2017  Amy Rocha Dec 30, 1955 [id number]     EMMI-General Discharge RED ON EMMI ALERT Day # 4 Date: 10/21/17 Red Alert Reason: " Unfilled prescriptions? Yes"   Outreach attempt # 1 to patient. Bad phone connection with patient and delay in hearing patient. She states that she is doing fairly well. She reports that HHPT just left her home. Reviewed and addressed red alert with patient. She reports error in response. She states that due to her phone issues the automated machine was having difficulty hearing and recording her responses correctly. She denies any issues regarding meds. She confirmed that she has all her meds in the home. She reports that she had an issue with the wound VAC not working properly. The therapist removed the VAC during visit today and patient wanting to know if she can get it wet and shower. Advised patient to contact MD office to alert them that [facility] was removed and to get special instructions on how to care for area. She voiced understanding that she would do so. She reports that therapist told her that per d/c instructions VAC was to be removed on 10/23/17 anyway. She states that she is having pain. She is taking pain meds as needed. RN CM provided education to patient regarding pain mgmt and pre-medicating prior to therapy sessions. Patient was able to voice that she feels pain is getting better and not worse. She denies any further RN CM needs or concerns at this time. Patient has completed post discharge automated calls. She was appreciative of follow up call.       Plan: RN CM will close case at this time as no further interventions needed.    Enzo Montgomery, RN,BSN,CCM [facility] Telephonic Care Management Coordinator Direct Phone: [phone] Toll Free: [phone] Fax: [phone]

## 2017-10-27 DIAGNOSIS — M25552 Pain in left hip: Secondary | ICD-10-CM | POA: Diagnosis not present

## 2017-11-03 DIAGNOSIS — M545 Low back pain: Secondary | ICD-10-CM | POA: Diagnosis not present

## 2017-11-03 DIAGNOSIS — Z5181 Encounter for therapeutic drug level monitoring: Secondary | ICD-10-CM | POA: Diagnosis not present

## 2017-11-03 DIAGNOSIS — M9904 Segmental and somatic dysfunction of sacral region: Secondary | ICD-10-CM | POA: Diagnosis not present

## 2017-11-03 DIAGNOSIS — M5136 Other intervertebral disc degeneration, lumbar region: Secondary | ICD-10-CM | POA: Diagnosis not present

## 2017-11-24 ENCOUNTER — Ambulatory Visit: Payer: Medicaid Other | Admitting: Family Medicine

## 2017-12-01 DIAGNOSIS — M9904 Segmental and somatic dysfunction of sacral region: Secondary | ICD-10-CM | POA: Diagnosis not present

## 2017-12-01 DIAGNOSIS — M545 Low back pain: Secondary | ICD-10-CM | POA: Diagnosis not present

## 2017-12-01 DIAGNOSIS — M5136 Other intervertebral disc degeneration, lumbar region: Secondary | ICD-10-CM | POA: Diagnosis not present

## 2017-12-01 DIAGNOSIS — Z5181 Encounter for therapeutic drug level monitoring: Secondary | ICD-10-CM | POA: Diagnosis not present

## 2017-12-04 ENCOUNTER — Other Ambulatory Visit: Payer: Self-pay | Admitting: Family Medicine

## 2017-12-04 NOTE — Telephone Encounter (Signed)
Requested medication (s) are due for refill today:   Yes  Was due in July/August.  Wasn't sure whether to fill this or not.   Criteria not met and appt 2 weeks away.  Requested medication (s) are on the active medication list:   Yes  Future visit scheduled:   Yes 12/22/17 with Merrie Roof.   Last ordered: 03/28/17  #30  6 refills   Requested Prescriptions  Pending Prescriptions Disp Refills   TRINTELLIX 20 MG TABS tablet [Pharmacy Med Name: TRINTELLIX 20 MG TABLET 20 TAB] 30 tablet 6    Sig: TAKE 1 TABLET BY MOUTH DAILY     Psychiatry: Antidepressants - Serotonin Modulator Failed - 12/04/2017 10:24 AM      Failed - Completed PHQ-2 or PHQ-9 in the last 360 days.      Failed - Valid encounter within last 6 months    Recent Outpatient Visits          7 months ago [place], [place], [place]   8 months ago Essential hypertension   [facility] Merrie Roof [place], [place]   1 year ago Annual physical exam   [facility] [place], [place]   1 year ago Acute lower UTI   [facility], [place], [place]   1 year ago Upper respiratory tract infection, unspecified type   [facility], [place], [place]      Future Appointments            In 2 weeks [facility], [place], [place], [place]   In 2 months  [employer], [place]

## 2017-12-16 ENCOUNTER — Encounter: Payer: Self-pay | Admitting: Family Medicine

## 2017-12-16 ENCOUNTER — Ambulatory Visit (INDEPENDENT_AMBULATORY_CARE_PROVIDER_SITE_OTHER): Payer: Medicare Other | Admitting: Family Medicine

## 2017-12-16 VITALS — BP 115/74 | HR 85 | Temp 97.8°F | Ht 64.0 in | Wt 156.2 lb

## 2017-12-16 DIAGNOSIS — Z23 Encounter for immunization: Secondary | ICD-10-CM

## 2017-12-16 DIAGNOSIS — M810 Age-related osteoporosis without current pathological fracture: Secondary | ICD-10-CM

## 2017-12-16 DIAGNOSIS — I10 Essential (primary) hypertension: Secondary | ICD-10-CM

## 2017-12-16 DIAGNOSIS — I70209 Unspecified atherosclerosis of native arteries of extremities, unspecified extremity: Secondary | ICD-10-CM | POA: Diagnosis not present

## 2017-12-16 DIAGNOSIS — E782 Mixed hyperlipidemia: Secondary | ICD-10-CM | POA: Diagnosis not present

## 2017-12-16 NOTE — Progress Notes (Signed)
BP 115/74 (BP Location: Right Arm, Patient Position: Sitting, Cuff Size: Normal)   Pulse 85   Temp 97.8 F (36.6 C) (Oral)   Ht 5\' 4"  (1.626 m)   Wt 156 lb 3.2 oz (70.9 kg)   LMP 09/13/1975 (Approximate)   SpO2 97%   BMI 26.81 kg/m    Subjective:    Patient ID: Amy Rocha, female    DOB: [birth year], 62 y.o.   MRN: [id number]  HPI: Amy Rocha is a 62 y.o. female  Chief Complaint  Patient presents with  . Hypotenstion  . Osteoporosis   Here today with concern for low blood pressures the past few months, particularly since her last hip surgery 2 months ago. Running low 100-110s-60s-70s. Stopped her amlodipine over a month ago which helped some but still having low readings most of the time. Denies pre-syncopal sxs. Drinking plenty of water. Is taking 20 mg oxycodone 4 times daily.   Has been diagnosed in the past with significant osteoporosis and was previously treated with fosamax "years ago". Wondering if she should be doing anything further for treatment. Not currently on OTC supplementation of vit D or calcium.   Taking atorvastatin daily for her cholesterol without issue. Trying to eat well. Unable to exercise due to significant hip and joint pain issues. Denies CP, SOB, myalgias, claudication.   Past Medical History:  Diagnosis Date  . Anxiety   . Arthritis   . Asthma   . Avascular necrosis ([place])   . Blood transfusion without reported diagnosis   . Calculus of kidney   . CHF (congestive heart failure) ([place])   . Chronic pain syndrome   . Complication of anesthesia    aspirated in recovery after  hip replacement 1998  . COPD (chronic obstructive pulmonary disease) ([place])   . Depression   . GERD (gastroesophageal reflux disease)   . Heart failure ([place])   . Hyperlipidemia   . Hypertension   . IBS (irritable bowel syndrome)   . Joint pain   . Osteoporosis   . Oxygen deficiency   . Peripheral vascular disease ([place])   . PONV (postoperative nausea and  vomiting)   . Urge incontinence    Social History   Socioeconomic History  . Marital status: Married    Spouse name: Not on file  . Number of children: Not on file  . Years of education: Not on file  . Highest education level: Not on file  Occupational History  . Not on file  Social Needs  . Financial resource strain: Not on file  . Food insecurity:    Worry: Not on file    Inability: Not on file  . Transportation needs:    Medical: Not on file    Non-medical: Not on file  Tobacco Use  . Smoking status: Former Smoker    Last attempt to quit: 02/19/1996    Years since quitting: 21.8  . Smokeless tobacco: Never Used  Substance and Sexual Activity  . Alcohol use: No    Alcohol/week: 0.0 standard drinks  . Drug use: No  . Sexual activity: Yes  Lifestyle  . Physical activity:    Days per week: Not on file    Minutes per session: Not on file  . Stress: Not on file  Relationships  . Social connections:    Talks on phone: Not on file    Gets together: Not on file    Attends religious service: Not on file    Active  member of club or organization: Not on file    Attends meetings of clubs or organizations: Not on file    Relationship status: Not on file  . Intimate partner violence:    Fear of current or ex partner: Not on file    Emotionally abused: Not on file    Physically abused: Not on file    Forced sexual activity: Not on file  Other Topics Concern  . Not on file  Social History Narrative  . Not on file    Relevant past medical, surgical, family and social history reviewed and updated as indicated. Interim medical history since our last visit reviewed. Allergies and medications reviewed and updated.  Review of Systems  Per HPI unless specifically indicated above     Objective:    BP 115/74 (BP Location: Right Arm, Patient Position: Sitting, Cuff Size: Normal)   Pulse 85   Temp 97.8 F (36.6 C) (Oral)   Ht 5\' 4"  (1.626 m)   Wt 156 lb 3.2 oz (70.9 kg)    LMP 09/13/1975 (Approximate)   SpO2 97%   BMI 26.81 kg/m   Wt Readings from Last 3 Encounters:  12/16/17 156 lb 3.2 oz (70.9 kg)  10/14/17 152 lb 8 oz (69.2 kg)  10/07/17 152 lb 8 oz (69.2 kg)    Physical Exam  Constitutional: She is oriented to person, place, and time. She appears well-developed and well-nourished.  HENT:  Head: Atraumatic.  Eyes: Conjunctivae and EOM are normal.  Neck: Normal range of motion. Neck supple.  Cardiovascular: Normal rate, regular rhythm and normal heart sounds.  Pulmonary/Chest: Effort normal and breath sounds normal.  Musculoskeletal: Normal range of motion.  Neurological: She is alert and oriented to person, place, and time.  Skin: Skin is warm and dry.  Psychiatric: She has a normal mood and affect. Her behavior is normal. Thought content normal.  Nursing note and vitals reviewed.   Results for orders placed or performed in visit on 12/16/17  Vitamin D (25 hydroxy)  Result Value Ref Range   Vit D, 25-Hydroxy 7.6 (L) 30.0 - 100.0 ng/mL  Comprehensive metabolic panel  Result Value Ref Range   Glucose 96 65 - 99 mg/dL   BUN 10 8 - 27 mg/dL   Creatinine, Ser 0.96 0.57 - 1.00 mg/dL   GFR calc non Af Amer 63 >59 mL/min/1.73   GFR calc Af Amer 72 >59 mL/min/1.73   BUN/Creatinine Ratio 10 (L) 12 - 28   Sodium 144 134 - 144 mmol/L   Potassium 5.7 (H) 3.5 - 5.2 mmol/L   Chloride 105 96 - 106 mmol/L   CO2 23 20 - 29 mmol/L   Calcium 9.4 8.7 - 10.3 mg/dL   Total Protein 6.4 6.0 - 8.5 g/dL   Albumin 4.0 3.6 - 4.8 g/dL   Globulin, Total 2.4 1.5 - 4.5 g/dL   Albumin/Globulin Ratio 1.7 1.2 - 2.2   Bilirubin Total 0.2 0.0 - 1.2 mg/dL   Alkaline Phosphatase 141 (H) 39 - 117 IU/L   AST 45 (H) 0 - 40 IU/L   ALT 37 (H) 0 - 32 IU/L  Lipid Panel w/o Chol/HDL Ratio  Result Value Ref Range   Cholesterol, Total 192 100 - 199 mg/dL   Triglycerides 174 (H) 0 - 149 mg/dL   HDL 55 >39 mg/dL   VLDL Cholesterol Cal 35 5 - 40 mg/dL   LDL Calculated 102 (H) 0 -  99 mg/dL      Assessment & Plan:  Problem List Items Addressed This Visit      Cardiovascular and Mediastinum   Atherosclerotic peripheral vascular disease ([place])    Recheck lipids, adjust as needed. Continue current regimen      Hypertension - Primary    Remain off hypertensive medications given low readings. Work on weaning down on pain medicine as appropriate as this may be dropping things further. Push fluids, increase sodium intake when dropping low.       Relevant Orders   Comprehensive metabolic panel (Completed)     Musculoskeletal and Integument   Osteoporosis    Discussed multiple tx options for significant osteoporosis, pt opting for referral to Endocrinology to discuss reclast infusions. Also discussed calcium and vit D supplementation. Will check vit D today to see how much supplementation is necessary       Relevant Orders   Ambulatory referral to Endocrinology   Vitamin D (25 hydroxy) (Completed)     Other   Hyperlipidemia    Check lipids, adjust as needed. COntinue lipitor      Relevant Orders   Lipid Panel w/o Chol/HDL Ratio (Completed)    Other Visit Diagnoses    Need for influenza vaccination       Relevant Orders   Flu Vaccine QUAD 6+ mos PF IM (Fluarix Quad PF) (Completed)       Follow up plan: Return in about 6 months (around 06/17/2018) for CPE.

## 2017-12-16 NOTE — Patient Instructions (Addendum)
Calcium - 1200 mg Vitamin D - 800 IU    Influenza (Flu) Vaccine (Inactivated or Recombinant): What You Need to Know 1. Why get vaccinated? Influenza ("flu") is a contagious disease that spreads around the [place] every year, usually between October and May. Flu is caused by influenza viruses, and is spread mainly by coughing, sneezing, and close contact. Anyone can get flu. Flu strikes suddenly and can last several days. Symptoms vary by age, but can include:  fever/chills  sore throat  muscle aches  fatigue  cough  headache  runny or stuffy nose  Flu can also lead to pneumonia and blood infections, and cause diarrhea and seizures in children. If you have a medical condition, such as heart or lung disease, flu can make it worse. Flu is more dangerous for some people. Infants and young children, people 46 years of age and older, pregnant women, and people with certain health conditions or a weakened immune system are at greatest risk. Each year thousands of people in the [place] die from flu, and many more are hospitalized. Flu vaccine can:  keep you from getting flu,  make flu less severe if you do get it, and  keep you from spreading flu to your family and other people. 2. Inactivated and recombinant flu vaccines A dose of flu vaccine is recommended every flu season. Children 6 months through 33 years of age may need two doses during the same flu season. Everyone else needs only one dose each flu season. Some inactivated flu vaccines contain a very small amount of a mercury-based preservative called thimerosal. Studies have not shown thimerosal in vaccines to be harmful, but flu vaccines that do not contain thimerosal are available. There is no live flu virus in flu shots. They cannot cause the flu. There are many flu viruses, and they are always changing. Each year a new flu vaccine is made to protect against three or four viruses that are likely to cause disease  in the upcoming flu season. But even when the vaccine doesn't exactly match these viruses, it may still provide some protection. Flu vaccine cannot prevent:  flu that is caused by a virus not covered by the vaccine, or  illnesses that look like flu but are not.  It takes about 2 weeks for protection to develop after vaccination, and protection lasts through the flu season. 3. Some people should not get this vaccine Tell the person who is giving you the vaccine:  If you have any severe, life-threatening allergies. If you ever had a life-threatening allergic reaction after a dose of flu vaccine, or have a severe allergy to any part of this vaccine, you may be advised not to get vaccinated. Most, but not all, types of flu vaccine contain a small amount of egg protein.  If you ever had Guillain-Barr Syndrome (also called GBS). Some people with a history of GBS should not get this vaccine. This should be discussed with your doctor.  If you are not feeling well. It is usually okay to get flu vaccine when you have a mild illness, but you might be asked to come back when you feel better.  4. Risks of a vaccine reaction With any medicine, including vaccines, there is a chance of reactions. These are usually mild and go away on their own, but serious reactions are also possible. Most people who get a flu shot do not have any problems with it. Minor problems following a flu shot include:  soreness,  redness, or swelling where the shot was given  hoarseness  sore, red or itchy eyes  cough  fever  aches  headache  itching  fatigue  If these problems occur, they usually begin soon after the shot and last 1 or 2 days. More serious problems following a flu shot can include the following:  There may be a small increased risk of Guillain-Barre Syndrome (GBS) after inactivated flu vaccine. This risk has been estimated at 1 or 2 additional cases per million people vaccinated. This is much  lower than the risk of severe complications from flu, which can be prevented by flu vaccine.  Young children who get the flu shot along with pneumococcal vaccine (PCV13) and/or DTaP vaccine at the same time might be slightly more likely to have a seizure caused by fever. Ask your doctor for more information. Tell your doctor if a child who is getting flu vaccine has ever had a seizure.  Problems that could happen after any injected vaccine:  People sometimes faint after a medical procedure, including vaccination. Sitting or lying down for about 15 minutes can help prevent fainting, and injuries caused by a fall. Tell your doctor if you feel dizzy, or have vision changes or ringing in the ears.  Some people get severe pain in the shoulder and have difficulty moving the arm where a shot was given. This happens very rarely.  Any medication can cause a severe allergic reaction. Such reactions from a vaccine are very rare, estimated at about 1 in a million doses, and would happen within a few minutes to a few hours after the vaccination. As with any medicine, there is a very remote chance of a vaccine causing a serious injury or death. The safety of vaccines is always being monitored. For more information, visit: http://www.aguilar.org/ 5. What if there is a serious reaction? What should I look for? Look for anything that concerns you, such as signs of a severe allergic reaction, very high fever, or unusual behavior. Signs of a severe allergic reaction can include hives, swelling of the face and throat, difficulty breathing, a fast heartbeat, dizziness, and weakness. These would start a few minutes to a few hours after the vaccination. What should I do?  If you think it is a severe allergic reaction or other emergency that can't wait, call 9-1-1 and get the person to the nearest hospital. Otherwise, call your doctor.  Reactions should be reported to the Vaccine Adverse Event Reporting System  (VAERS). Your doctor should file this report, or you can do it yourself through the VAERS web site at www.vaers.SamedayNews.es, or by calling 564-020-4389. ? VAERS does not give medical advice. 6. The National Vaccine Injury Compensation Program The [employer] Vaccine Injury Compensation Program (VICP) is a federal program that was created to compensate people who may have been injured by certain vaccines. Persons who believe they may have been injured by a vaccine can learn about the program and about filing a claim by calling (702) 280-3949 or visiting the [facility] website at GoldCloset.com.ee. There is a time limit to file a claim for compensation. 7. How can I learn more?  Ask your healthcare provider. He or she can give you the vaccine package insert or suggest other sources of information.  Call your local or state health department.  Contact the Centers for Disease Control and Prevention (CDC): ? Call 828-204-0925 (1-800-CDC-INFO) or ? Visit CDC's website at https://gibson.com/ Vaccine Information Statement, Inactivated Influenza Vaccine (09/24/2013) This information is not intended to  replace advice given to you by your health care provider. Make sure you discuss any questions you have with your health care provider. Document Released: 11/29/2005 Document Revised: 10/26/2015 Document Reviewed: 10/26/2015 Elsevier Interactive Patient Education  2017 [employer].

## 2017-12-17 ENCOUNTER — Other Ambulatory Visit: Payer: Self-pay | Admitting: Family Medicine

## 2017-12-17 LAB — COMPREHENSIVE METABOLIC PANEL
A/G RATIO: 1.7 (ref 1.2–2.2)
ALT: 37 IU/L — AB (ref 0–32)
AST: 45 IU/L — AB (ref 0–40)
Albumin: 4 g/dL (ref 3.6–4.8)
Alkaline Phosphatase: 141 IU/L — ABNORMAL HIGH (ref 39–117)
BUN/Creatinine Ratio: 10 — ABNORMAL LOW (ref 12–28)
BUN: 10 mg/dL (ref 8–27)
Bilirubin Total: 0.2 mg/dL (ref 0.0–1.2)
CALCIUM: 9.4 mg/dL (ref 8.7–10.3)
CO2: 23 mmol/L (ref 20–29)
CREATININE: 0.96 mg/dL (ref 0.57–1.00)
Chloride: 105 mmol/L (ref 96–106)
GFR, EST AFRICAN AMERICAN: 72 mL/min/{1.73_m2} (ref >59)
GFR, EST NON AFRICAN AMERICAN: 63 mL/min/{1.73_m2} (ref >59)
GLOBULIN, TOTAL: 2.4 g/dL (ref 1.5–4.5)
Glucose: 96 mg/dL (ref 65–99)
POTASSIUM: 5.7 mmol/L — AB (ref 3.5–5.2)
Sodium: 144 mmol/L (ref 134–144)
TOTAL PROTEIN: 6.4 g/dL (ref 6.0–8.5)

## 2017-12-17 LAB — LIPID PANEL W/O CHOL/HDL RATIO
Cholesterol, Total: 192 mg/dL (ref 100–199)
HDL: 55 mg/dL (ref >39)
LDL CALC: 102 mg/dL — AB (ref 0–99)
TRIGLYCERIDES: 174 mg/dL — AB (ref 0–149)
VLDL Cholesterol Cal: 35 mg/dL (ref 5–40)

## 2017-12-17 LAB — VITAMIN D 25 HYDROXY (VIT D DEFICIENCY, FRACTURES): Vit D, 25-Hydroxy: 7.6 ng/mL — ABNORMAL LOW (ref 30.0–100.0)

## 2017-12-17 MED ORDER — VITAMIN D (ERGOCALCIFEROL) 1.25 MG (50000 UNIT) PO CAPS: 50000.0000 [IU] | ORAL_CAPSULE | ORAL | 3 refills | Status: DC

## 2017-12-18 NOTE — Assessment & Plan Note (Signed)
Recheck lipids, adjust as needed. Continue current regimen 

## 2017-12-18 NOTE — Assessment & Plan Note (Signed)
Discussed multiple tx options for significant osteoporosis, pt opting for referral to Endocrinology to discuss reclast infusions. Also discussed calcium and vit D supplementation. Will check vit D today to see how much supplementation is necessary

## 2017-12-18 NOTE — Assessment & Plan Note (Signed)
Remain off hypertensive medications given low readings. Work on weaning down on pain medicine as appropriate as this may be dropping things further. Push fluids, increase sodium intake when dropping low.

## 2017-12-18 NOTE — Assessment & Plan Note (Signed)
Check lipids, adjust as needed. COntinue lipitor

## 2017-12-19 DIAGNOSIS — Z5181 Encounter for therapeutic drug level monitoring: Secondary | ICD-10-CM | POA: Diagnosis not present

## 2017-12-19 DIAGNOSIS — M9904 Segmental and somatic dysfunction of sacral region: Secondary | ICD-10-CM | POA: Diagnosis not present

## 2017-12-19 DIAGNOSIS — M545 Low back pain: Secondary | ICD-10-CM | POA: Diagnosis not present

## 2017-12-19 DIAGNOSIS — M5136 Other intervertebral disc degeneration, lumbar region: Secondary | ICD-10-CM | POA: Diagnosis not present

## 2017-12-22 ENCOUNTER — Ambulatory Visit: Payer: Medicaid Other | Admitting: Family Medicine

## 2017-12-24 ENCOUNTER — Ambulatory Visit: Payer: Medicaid Other | Admitting: Family Medicine

## 2017-12-31 DIAGNOSIS — M810 Age-related osteoporosis without current pathological fracture: Secondary | ICD-10-CM | POA: Insufficient documentation

## 2018-01-01 DIAGNOSIS — E559 Vitamin D deficiency, unspecified: Secondary | ICD-10-CM | POA: Diagnosis not present

## 2018-01-01 DIAGNOSIS — M810 Age-related osteoporosis without current pathological fracture: Secondary | ICD-10-CM | POA: Diagnosis not present

## 2018-01-06 DIAGNOSIS — M810 Age-related osteoporosis without current pathological fracture: Secondary | ICD-10-CM | POA: Diagnosis not present

## 2018-01-06 DIAGNOSIS — E559 Vitamin D deficiency, unspecified: Secondary | ICD-10-CM | POA: Insufficient documentation

## 2018-01-23 ENCOUNTER — Other Ambulatory Visit: Payer: Self-pay | Admitting: Family Medicine

## 2018-01-26 DIAGNOSIS — Z5181 Encounter for therapeutic drug level monitoring: Secondary | ICD-10-CM | POA: Diagnosis not present

## 2018-01-26 DIAGNOSIS — M9904 Segmental and somatic dysfunction of sacral region: Secondary | ICD-10-CM | POA: Diagnosis not present

## 2018-01-26 DIAGNOSIS — M5136 Other intervertebral disc degeneration, lumbar region: Secondary | ICD-10-CM | POA: Diagnosis not present

## 2018-01-26 DIAGNOSIS — M545 Low back pain: Secondary | ICD-10-CM | POA: Diagnosis not present

## 2018-02-05 DIAGNOSIS — Z96642 Presence of left artificial hip joint: Secondary | ICD-10-CM | POA: Diagnosis not present

## 2018-02-05 DIAGNOSIS — M7062 Trochanteric bursitis, left hip: Secondary | ICD-10-CM | POA: Diagnosis not present

## 2018-02-19 ENCOUNTER — Ambulatory Visit: Payer: Medicare Other

## 2018-02-23 DIAGNOSIS — M5136 Other intervertebral disc degeneration, lumbar region: Secondary | ICD-10-CM | POA: Diagnosis not present

## 2018-02-23 DIAGNOSIS — Z5181 Encounter for therapeutic drug level monitoring: Secondary | ICD-10-CM | POA: Diagnosis not present

## 2018-02-23 DIAGNOSIS — M9904 Segmental and somatic dysfunction of sacral region: Secondary | ICD-10-CM | POA: Diagnosis not present

## 2018-02-23 DIAGNOSIS — M545 Low back pain: Secondary | ICD-10-CM | POA: Diagnosis not present

## 2018-03-23 DIAGNOSIS — M9904 Segmental and somatic dysfunction of sacral region: Secondary | ICD-10-CM | POA: Diagnosis not present

## 2018-03-23 DIAGNOSIS — Z5181 Encounter for therapeutic drug level monitoring: Secondary | ICD-10-CM | POA: Diagnosis not present

## 2018-03-23 DIAGNOSIS — M5136 Other intervertebral disc degeneration, lumbar region: Secondary | ICD-10-CM | POA: Diagnosis not present

## 2018-03-23 DIAGNOSIS — M545 Low back pain: Secondary | ICD-10-CM | POA: Diagnosis not present

## 2018-04-02 ENCOUNTER — Ambulatory Visit (INDEPENDENT_AMBULATORY_CARE_PROVIDER_SITE_OTHER): Payer: Medicare Other

## 2018-04-02 VITALS — BP 118/70 | HR 68 | Temp 98.0°F | Resp 16 | Ht 64.0 in | Wt 162.8 lb

## 2018-04-02 DIAGNOSIS — Z1239 Encounter for other screening for malignant neoplasm of breast: Secondary | ICD-10-CM

## 2018-04-02 DIAGNOSIS — Z0000 Encounter for general adult medical examination without abnormal findings: Secondary | ICD-10-CM

## 2018-04-02 DIAGNOSIS — Z114 Encounter for screening for human immunodeficiency virus [HIV]: Secondary | ICD-10-CM | POA: Diagnosis not present

## 2018-04-02 MED ORDER — ALBUTEROL SULFATE HFA 108 (90 BASE) MCG/ACT IN AERS: 2.0000 | INHALATION_SPRAY | Freq: Four times a day (QID) | RESPIRATORY_TRACT | 3 refills | Status: DC | PRN

## 2018-04-02 NOTE — Patient Instructions (Addendum)
Amy Rocha , Thank you for taking time to come for your Medicare Wellness Visit. I appreciate your ongoing commitment to your health goals. Please review the following plan we discussed and let me know if I can assist you in the future.   Screening recommendations/referrals: Colonoscopy: completed 11/29/2013 Mammogram: due now Please call [phone] to schedule your mammogram.  Bone Density: completed Recommended yearly ophthalmology/optometry visit for glaucoma screening and checkup Recommended yearly dental visit for hygiene and checkup  Vaccinations: Influenza vaccine: up to date Pneumococcal vaccine: due at age 65 Tdap vaccine: completed 04/12/2008 Shingles vaccine: shingrix eligible, check with your insurance company for coverage    Advanced directives: Advance directive discussed with you today. I have provided a copy for you to complete at home and have notarized. Once this is complete please bring a copy in to our office so we can scan it into your chart.  Conditions/risks identified: none  Next appointment: Follow up on 06/17/2018 at 1:00pm with Wynona Dove. Follow up in one year for your annual wellness exam.   Preventive Care 40-64 Years, Female Preventive care refers to lifestyle choices and visits with your health care provider that can promote health and wellness. What does preventive care include?  A yearly physical exam. This is also called an annual well check.  Dental exams once or twice a year.  Routine eye exams. Ask your health care provider how often you should have your eyes checked.  Personal lifestyle choices, including:  Daily care of your teeth and gums.  Regular physical activity.  Eating a healthy diet.  Avoiding tobacco and drug use.  Limiting alcohol use.  Practicing safe sex.  Taking low-dose aspirin daily starting at age 65.  Taking vitamin and mineral supplements as recommended by your health care provider. What happens during an  annual well check? The services and screenings done by your health care provider during your annual well check will depend on your age, overall health, lifestyle risk factors, and family history of disease. Counseling  Your health care provider may ask you questions about your:  Alcohol use.  Tobacco use.  Drug use.  Emotional well-being.  Home and relationship well-being.  Sexual activity.  Eating habits.  Work and work Statistician.  Method of birth control.  Menstrual cycle.  Pregnancy history. Screening  You may have the following tests or measurements:  Height, weight, and BMI.  Blood pressure.  Lipid and cholesterol levels. These may be checked every 5 years, or more frequently if you are over 48 years old.  Skin check.  Lung cancer screening. You may have this screening every year starting at age 65 if you have a 30-pack-year history of smoking and currently smoke or have quit within the past 15 years.  Fecal occult blood test (FOBT) of the stool. You may have this test every year starting at age 65.  Flexible sigmoidoscopy or colonoscopy. You may have a sigmoidoscopy every 5 years or a colonoscopy every 10 years starting at age 71.  Hepatitis C blood test.  Hepatitis B blood test.  Sexually transmitted disease (STD) testing.  Diabetes screening. This is done by checking your blood sugar (glucose) after you have not eaten for a while (fasting). You may have this done every 1-3 years.  Mammogram. This may be done every 1-2 years. Talk to your health care provider about when you should start having regular mammograms. This may depend on whether you have a family history of breast cancer.  BRCA-related cancer  screening. This may be done if you have a family history of breast, ovarian, tubal, or peritoneal cancers.  Pelvic exam and Pap test. This may be done every 3 years starting at age 65. Starting at age 65, this may be done every 5 years if you have a Pap  test in combination with an HPV test.  Bone density scan. This is done to screen for osteoporosis. You may have this scan if you are at high risk for osteoporosis. Discuss your test results, treatment options, and if necessary, the need for more tests with your health care provider. Vaccines  Your health care provider may recommend certain vaccines, such as:  Influenza vaccine. This is recommended every year.  Tetanus, diphtheria, and acellular pertussis (Tdap, Td) vaccine. You may need a Td booster every 10 years.  Zoster vaccine. You may need this after age 65.  Pneumococcal 13-valent conjugate (PCV13) vaccine. You may need this if you have certain conditions and were not previously vaccinated.  Pneumococcal polysaccharide (PPSV23) vaccine. You may need one or two doses if you smoke cigarettes or if you have certain conditions. Talk to your health care provider about which screenings and vaccines you need and how often you need them. This information is not intended to replace advice given to you by your health care provider. Make sure you discuss any questions you have with your health care provider. Document Released: 03/03/2015 Document Revised: 10/25/2015 Document Reviewed: 12/06/2014 Elsevier Interactive Patient Education  2017 [place] Prevention in the Home Falls can cause injuries. They can happen to people of all ages. There are many things you can do to make your home safe and to help prevent falls. What can I do on the outside of my home?  Regularly fix the edges of walkways and driveways and fix any cracks.  Remove anything that might make you trip as you walk through a door, such as a raised step or threshold.  Trim any bushes or trees on the path to your home.  Use bright outdoor lighting.  Clear any walking paths of anything that might make someone trip, such as rocks or tools.  Regularly check to see if handrails are loose or broken. Make sure that  both sides of any steps have handrails.  Any raised decks and porches should have guardrails on the edges.  Have any leaves, snow, or ice cleared regularly.  Use sand or salt on walking paths during winter.  Clean up any spills in your garage right away. This includes oil or grease spills. What can I do in the bathroom?  Use night lights.  Install grab bars by the toilet and in the tub and shower. Do not use towel bars as grab bars.  Use non-skid mats or decals in the tub or shower.  If you need to sit down in the shower, use a plastic, non-slip stool.  Keep the floor dry. Clean up any water that spills on the floor as soon as it happens.  Remove soap buildup in the tub or shower regularly.  Attach bath mats securely with double-sided non-slip rug tape.  Do not have throw rugs and other things on the floor that can make you trip. What can I do in the bedroom?  Use night lights.  Make sure that you have a light by your bed that is easy to reach.  Do not use any sheets or blankets that are too big for your bed. They should not  hang down onto the floor.  Have a firm chair that has side arms. You can use this for support while you get dressed.  Do not have throw rugs and other things on the floor that can make you trip. What can I do in the kitchen?  Clean up any spills right away.  Avoid walking on wet floors.  Keep items that you use a lot in easy-to-reach places.  If you need to reach something above you, use a strong step stool that has a grab bar.  Keep electrical cords out of the way.  Do not use floor polish or wax that makes floors slippery. If you must use wax, use non-skid floor wax.  Do not have throw rugs and other things on the floor that can make you trip. What can I do with my stairs?  Do not leave any items on the stairs.  Make sure that there are handrails on both sides of the stairs and use them. Fix handrails that are broken or loose. Make sure  that handrails are as long as the stairways.  Check any carpeting to make sure that it is firmly attached to the stairs. Fix any carpet that is loose or worn.  Avoid having throw rugs at the top or bottom of the stairs. If you do have throw rugs, attach them to the floor with carpet tape.  Make sure that you have a light switch at the top of the stairs and the bottom of the stairs. If you do not have them, ask someone to add them for you. What else can I do to help prevent falls?  Wear shoes that:  Do not have high heels.  Have rubber bottoms.  Are comfortable and fit you well.  Are closed at the toe. Do not wear sandals.  If you use a stepladder:  Make sure that it is fully opened. Do not climb a closed stepladder.  Make sure that both sides of the stepladder are locked into place.  Ask someone to hold it for you, if possible.  Clearly mark and make sure that you can see:  Any grab bars or handrails.  First and last steps.  Where the edge of each step is.  Use tools that help you move around (mobility aids) if they are needed. These include:  Canes.  Walkers.  Scooters.  Crutches.  Turn on the lights when you go into a dark area. Replace any light bulbs as soon as they burn out.  Set up your furniture so you have a clear path. Avoid moving your furniture around.  If any of your floors are uneven, fix them.  If there are any pets around you, be aware of where they are.  Review your medicines with your doctor. Some medicines can make you feel dizzy. This can increase your chance of falling. Ask your doctor what other things that you can do to help prevent falls. This information is not intended to replace advice given to you by your health care provider. Make sure you discuss any questions you have with your health care provider. Document Released: 12/01/2008 Document Revised: 07/13/2015 Document Reviewed: 03/11/2014 Elsevier Interactive Patient Education   2017 [employer].

## 2018-04-02 NOTE — Progress Notes (Signed)
Subjective:   Amy Rocha is a 63 y.o. female who presents for Medicare Annual (Subsequent) preventive examination.  Review of Systems:  Cardiac Risk Factors include: hypertension;dyslipidemia     Objective:     Vitals: BP 118/70 (BP Location: Left Arm, Patient Position: Sitting, Cuff Size: Normal)   Pulse 68   Temp 98 F (36.7 C) (Temporal)   Resp 16   Ht '5\' 4"'  (1.626 m)   Wt 162 lb 12.8 oz (73.8 kg)   LMP 09/13/1975 (Approximate)   BMI 27.94 kg/m   Body mass index is 27.94 kg/m.  Advanced Directives 04/02/2018 10/14/2017 10/07/2017 01/23/2017 01/21/2017 01/03/2017 11/27/2016  Does Patient Have a Medical Advance Directive? No No No No No No No  Does patient want to make changes to medical advance directive? Yes (MAU/Ambulatory/Procedural Areas - Information given) - - - - - -  Would patient like information on creating a medical advance directive? - No - Patient declined No - Patient declined - Yes (Inpatient - patient requests chaplain consult to create a medical advance directive) No - Patient declined No - Patient declined    Tobacco Social History   Tobacco Use  Smoking Status Former Smoker  . Last attempt to quit: 02/19/1996  . Years since quitting: 22.1  Smokeless Tobacco Never Used     Counseling given: Not Answered   Clinical Intake:  Pre-visit preparation completed: Yes  Pain : 0-10 Pain Score: 5  Pain Type: Chronic pain Pain Location: Leg Pain Orientation: Left Pain Descriptors / Indicators: Aching Pain Onset: More than a month ago(3 months ago ) Pain Frequency: Constant     Nutritional Status: BMI 25 -29 Overweight Nutritional Risks: None Diabetes: No  How often do you need to have someone help you when you read instructions, pamphlets, or other written materials from your doctor or pharmacy?: 1 - Never What is the last grade level you completed in school?: 12th grade  Interpreter Needed?: No  Information entered by ::  ,LPN    Past Medical History:  Diagnosis Date  . Anxiety   . Arthritis   . Asthma   . Avascular necrosis ([place])   . Blood transfusion without reported diagnosis   . Calculus of kidney   . CHF (congestive heart failure) ([place])   . Chronic pain syndrome   . Complication of anesthesia    aspirated in recovery after  hip replacement 1998  . COPD (chronic obstructive pulmonary disease) ([place])   . Depression   . GERD (gastroesophageal reflux disease)   . Heart failure ([place])   . Hyperlipidemia   . Hypertension   . IBS (irritable bowel syndrome)   . Joint pain   . Osteoporosis   . Oxygen deficiency   . Peripheral vascular disease ([place])   . PONV (postoperative nausea and vomiting)   . Urge incontinence    Past Surgical History:  Procedure Laterality Date  . ABDOMINAL HYSTERECTOMY    . avascular necrosis    . CARDIAC CATHETERIZATION Left 12/01/2015   Procedure: Left Heart Cath and Coronary Angiography;  Surgeon: Teodoro Spray, MD;  Location: [facility];  Service: Cardiovascular;  Laterality: Left;  . eyelid lift    . HERNIA REPAIR    . INTRAMEDULLARY (IM) NAIL INTERTROCHANTERIC Left 10/14/2017   Procedure: REPAIR OF LEFT GREATER TROCHANTER NONUNION;  Surgeon: Hessie Knows, MD;  Location: [facility];  Service: Orthopedics;  Laterality: Left;  . JOINT REPLACEMENT     hip  . NASAL  SINUS SURGERY    . TOTAL HIP REVISION Left 01/21/2017   Procedure: TOTAL HIP REVISION FEMORAL STEM WITH POLYETHYLENE EXCHANGE WITH CUPLINER;  Surgeon: Hessie Knows, MD;  Location: [facility];  Service: Orthopedics;  Laterality: Left;   Family History  Problem Relation Age of Onset  . Dementia Mother   . Cancer Father        colon  . Arthritis Brother   . Hypertension Daughter   . Aneurysm Brother    Social History   Socioeconomic History  . Marital status: Married    Spouse name: Not on file  . Number of children: Not on file  . Years of education: Not on file  . Highest education level: 12th  grade  Occupational History  . Occupation: disabled   Social Needs  . Financial resource strain: Not hard at all  . Food insecurity:    Worry: Never true    Inability: Never true  . Transportation needs:    Medical: No    Non-medical: No  Tobacco Use  . Smoking status: Former Smoker    Last attempt to quit: 02/19/1996    Years since quitting: 22.1  . Smokeless tobacco: Never Used  Substance and Sexual Activity  . Alcohol use: No    Alcohol/week: 0.0 standard drinks  . Drug use: No  . Sexual activity: Yes  Lifestyle  . Physical activity:    Days per week: 0 days    Minutes per session: 0 min  . Stress: Not at all  Relationships  . Social connections:    Talks on phone: More than three times a week    Gets together: More than three times a week    Attends religious service: More than 4 times per year    Active member of club or organization: No    Attends meetings of clubs or organizations: Never    Relationship status: Married  Other Topics Concern  . Not on file  Social History Narrative  . Not on file    Outpatient Encounter Medications as of 04/02/2018  Medication Sig  . acetaminophen (TYLENOL) 500 MG tablet Take 1,000 mg by mouth 2 (two) times daily as needed for moderate pain or headache.   . albuterol (PROVENTIL HFA;VENTOLIN HFA) 108 (90 Base) MCG/ACT inhaler Inhale 2 puffs into the lungs every 6 (six) hours as needed for wheezing or shortness of breath.  Marland Kitchen atorvastatin (LIPITOR) 40 MG tablet Take 1 tablet (40 mg total) by mouth at bedtime.  . budesonide-formoterol (SYMBICORT) 160-4.5 MCG/ACT inhaler Inhale 2 puffs into the lungs 2 (two) times daily. (Patient taking differently: Inhale 2 puffs into the lungs 2 (two) times daily as needed (shortness of breath). )  . cromolyn (OPTICROM) 4 % ophthalmic solution Place 1 drop into both eyes 4 (four) times daily.  . fluticasone (FLONASE) 50 MCG/ACT nasal spray Place 2 sprays into both nostrils daily. (Patient taking  differently: Place 2 sprays into both nostrils daily as needed for allergies. )  . loratadine (CLARITIN) 10 MG tablet Take 10 mg by mouth daily as needed for allergies.  . magnesium hydroxide (MILK OF MAGNESIA) 400 MG/5ML suspension Take 30 mLs by mouth daily as needed for mild constipation.  Marland Kitchen omeprazole (PRILOSEC) 20 MG capsule TAKE 1 CAPSULE BY MOUTH DAILY.  Marland Kitchen ondansetron (ZOFRAN-ODT) 4 MG disintegrating tablet Take 4 mg every 8 (eight) hours as needed by mouth for nausea or vomiting.   . Oxycodone HCl 20 MG TABS Take 20 mg by mouth  every 4 (four) hours as needed (pain).  . TRINTELLIX 20 MG TABS tablet TAKE 1 TABLET BY MOUTH DAILY  . valACYclovir (VALTREX) 500 MG tablet Take 4 tablets (2,000 mg total) by mouth 2 (two) times daily as needed. (Patient taking differently: Take 2,000 mg by mouth 2 (two) times daily as needed (fever blisters). )  . Vitamin D, Ergocalciferol, (DRISDOL) 50000 units CAPS capsule Take 1 capsule (50,000 Units total) by mouth every 7 (seven) days.  . [DISCONTINUED] albuterol (PROVENTIL HFA;VENTOLIN HFA) 108 (90 Base) MCG/ACT inhaler Inhale 2 puffs into the lungs every 6 (six) hours as needed for wheezing or shortness of breath.  Karma Greaser 4 MG/0.1ML LIQD nasal spray kit Place 1 spray into the nose once.    No facility-administered encounter medications on file as of 04/02/2018.     Activities of Daily Living In your present state of health, do you have any difficulty performing the following activities: 04/02/2018 10/14/2017  Hearing? N N  Vision? Y N  Comment goes to [facility]  -  Difficulty concentrating or making decisions? Y N  Comment comes back  -  Walking or climbing stairs? Y Y  Comment due to hip  -  Dressing or bathing? N N  Doing errands, shopping? N N  Preparing Food and eating ? N -  Using the Toilet? N -  In the past six months, have you accidently leaked urine? Y -  Comment wear pads  -  Do you have problems with loss of bowel control? N -   Managing your Medications? N -  Managing your Finances? N -  Housekeeping or managing your Housekeeping? N -  Some recent data might be hidden    Patient Care Team: Volney American, PA-C as PCP - General (Family Medicine)    Assessment:   This is a routine wellness examination for Samantha.  Exercise Activities and Dietary recommendations Current Exercise Habits: The patient does not participate in regular exercise at present, Exercise limited by: orthopedic condition(s)(hips)  Goals    . Increase water intake     Recommend drinking at least 5-6 glasses of water a day        Fall Risk Fall Risk  04/02/2018 09/25/2016 09/12/2016 08/08/2016 09/25/2015  Falls in the past year? 0 No No No No  Comment - - - - -  Number falls in past yr: 0 - - - -  Injury with Fall? 0 - - - -  Comment - - - - -  Risk Factor Category  - - - - -  Risk for fall due to : - - - - -  Follow up - - - - -  [place]:  Any stairs in or around the home WITH handrails? No  stairs  Home free of loose throw rugs in walkways, pet beds, electrical cords, etc? No  Adequate lighting in your home to reduce risk of falls? No   ASSISTIVE DEVICES UTILIZED TO PREVENT FALLS:  Life alert? No  Use of a cane, walker or w/c? Cane if needed Grab bars in the bathroom? No  Shower chair or bench in shower? Yes  Elevated toilet seat or a handicapped toilet? Yes   DME ORDERS:  DME order needed?  No   TIMED UP AND GO:  Was the test performed? No .  Length of time to ambulate 10 feet: 11 sec.   GAIT:  Appearance of gait: Gait stead-fast without the  use of an assistive device.  Education: Fall risk prevention has been discussed.  Intervention(s) required? No    Depression Screen PHQ 2/9 Scores 04/02/2018 09/25/2016 09/12/2016 08/08/2016  PHQ - 2 Score 3 0 0 3  PHQ- 9 Score 9 - - 7  Exception Documentation - - - -     Cognitive Function     6CIT Screen 04/02/2018 09/12/2016   What Year? 0 points 0 points  What month? 0 points 0 points  What time? 0 points 0 points  Count back from 20 0 points 0 points  Months in reverse 0 points 0 points  Repeat phrase 0 points 0 points  Total Score 0 0    Immunization History  Administered Date(s) Administered  . Influenza,inj,Quad PF,6+ Mos 01/22/2017, 12/16/2017  . Influenza-Unspecified 10/29/2011, 12/30/2012, 12/02/2013, 01/30/2015, 11/22/2015  . Pneumococcal Polysaccharide-23 11/10/2009, 01/22/2017  . Td 04/12/2008  . Zoster 06/28/2015  . Zoster Recombinat (Shingrix) 01/06/2018    Qualifies for Shingles Vaccine?Yes  Zostavax completed 06/28/2015 Shingrix - first dose completed 01/06/2018  Tdap: up to date  Flu Vaccine:up to date   Pneumococcal Vaccine: not indicated   Screening Tests Health Maintenance  Topic Date Due  . HIV Screening  04/24/1968  . MAMMOGRAM  12/15/2015  . TETANUS/TDAP  04/12/2018  . COLONOSCOPY  11/30/2018  . INFLUENZA VACCINE  Completed  . Hepatitis C Screening  Completed    Cancer Screenings:  Colorectal Screening: Completed 11/29/2013. Repeat every 5 years  Mammogram: . Ordered 04/02/2018. Pt provided with contact info and advised to call to schedule appt. Pt aware the office will call re: appt.  Bone Density: not indicated   Lung Cancer Screening: (Low Dose CT Chest recommended if Age 76-80 years, 30 pack-year currently smoking OR have quit w/in 15years.) does not qualify.   Lung Cancer Screening Referral: An Epic message has been sent to Burgess Estelle, RN (Oncology Nurse Navigator) regarding the possible need for this exam. Raquel Sarna will review the patient's chart to determine if the patient truly qualifies for the exam. If the patient qualifies, Raquel Sarna will order the Low Dose CT of the chest to facilitate the scheduling of this exam.  Additional Screening:  Hepatitis C Screening: does qualify; Completed 09/06/2015  Vision Screening: Recommended annual ophthalmology exams for  early detection of glaucoma and other disorders of the eye. Is the patient up to date with their annual eye exam?  Yes  Who is the provider or what is the name of the office in which the pt attends annual eye exams? [facility]   Dental Screening: Recommended annual dental exams for proper oral hygiene  Community Resource Referral:  CRR required this visit?  No      Plan:    I have personally reviewed and addressed the Medicare Annual Wellness questionnaire and have noted the following in the patient's chart:  A. Medical and social history B. Use of alcohol, tobacco or illicit drugs  C. Current medications and supplements D. Functional ability and status E.  Nutritional status F.  Physical activity G. Advance directives H. List of other physicians I.  Hospitalizations, surgeries, and ER visits in previous 12 months J.  [place] such as hearing and vision if needed, cognitive and depression L. Referrals and appointments   In addition, I have reviewed and discussed with patient certain preventive protocols, quality metrics, and best practice recommendations. A written personalized care plan for preventive services as well as general preventive health recommendations were provided to  patient.   Signed,  Tyler Aas, LPN Nurse Health Advisor   Nurse Notes:pateint requests refill on albuterol to get her to her appt on 06/17/2018, verbal order received from [facility]. Order sent to pharmacy

## 2018-04-20 DIAGNOSIS — M9904 Segmental and somatic dysfunction of sacral region: Secondary | ICD-10-CM | POA: Diagnosis not present

## 2018-04-20 DIAGNOSIS — M47897 Other spondylosis, lumbosacral region: Secondary | ICD-10-CM | POA: Diagnosis not present

## 2018-04-20 DIAGNOSIS — M792 Neuralgia and neuritis, unspecified: Secondary | ICD-10-CM | POA: Diagnosis not present

## 2018-04-20 DIAGNOSIS — G894 Chronic pain syndrome: Secondary | ICD-10-CM | POA: Diagnosis not present

## 2018-04-20 DIAGNOSIS — M48062 Spinal stenosis, lumbar region with neurogenic claudication: Secondary | ICD-10-CM | POA: Diagnosis not present

## 2018-04-20 DIAGNOSIS — I739 Peripheral vascular disease, unspecified: Secondary | ICD-10-CM | POA: Diagnosis not present

## 2018-04-20 DIAGNOSIS — M5136 Other intervertebral disc degeneration, lumbar region: Secondary | ICD-10-CM | POA: Diagnosis not present

## 2018-04-20 DIAGNOSIS — Z5181 Encounter for therapeutic drug level monitoring: Secondary | ICD-10-CM | POA: Diagnosis not present

## 2018-04-20 DIAGNOSIS — M545 Low back pain: Secondary | ICD-10-CM | POA: Diagnosis not present

## 2018-04-20 DIAGNOSIS — G8929 Other chronic pain: Secondary | ICD-10-CM | POA: Diagnosis not present

## 2018-04-27 ENCOUNTER — Other Ambulatory Visit: Payer: Self-pay | Admitting: Family Medicine

## 2018-04-27 DIAGNOSIS — Z1231 Encounter for screening mammogram for malignant neoplasm of breast: Secondary | ICD-10-CM

## 2018-05-04 ENCOUNTER — Other Ambulatory Visit: Payer: Self-pay | Admitting: Family Medicine

## 2018-05-18 DIAGNOSIS — M9904 Segmental and somatic dysfunction of sacral region: Secondary | ICD-10-CM | POA: Diagnosis not present

## 2018-05-18 DIAGNOSIS — M5136 Other intervertebral disc degeneration, lumbar region: Secondary | ICD-10-CM | POA: Diagnosis not present

## 2018-05-18 DIAGNOSIS — Z5181 Encounter for therapeutic drug level monitoring: Secondary | ICD-10-CM | POA: Diagnosis not present

## 2018-05-18 DIAGNOSIS — M545 Low back pain: Secondary | ICD-10-CM | POA: Diagnosis not present

## 2018-05-27 ENCOUNTER — Ambulatory Visit: Payer: Self-pay | Admitting: Pharmacist

## 2018-05-27 DIAGNOSIS — F331 Major depressive disorder, recurrent, moderate: Secondary | ICD-10-CM

## 2018-05-27 NOTE — Chronic Care Management (AMB) (Signed)
  Chronic Care Management   Telephone Outreach Note  05/27/2018 Name: JAYLON GRODE MRN: [id number] DOB: Oct 09, 1955  Referred by: patient's health plan.  I reached out to Ms. Mady Gemma today by phone in response to a referral sent by Ms. Vergia Alcon Lana's health plan. Ms. GALI SPINNEY and I briefly discussed care management needs related to depression/anxiety, CHF, COPD, PVD, CKD, and osteoporosis.  Ms. Lenis was given information about Chronic Care Management services today including:  1. CCM service includes personalized support from designated clinical staff supervised by her physician, including individualized plan of care and coordination with other care providers 2. 24/7 contact phone numbers for assistance for urgent and routine care needs. 3. Service will only be billed when office clinical staff spend 20 minutes or more in a month to coordinate care. 4. Only one practitioner may furnish and bill the service in a calendar month. 5. The patient may stop CCM services at any time (effective at the end of the month) by phone call to the office staff. 6. The patient will be responsible for cost sharing (co-pay) of up to 20% of the service fee (after annual deductible is met).  Patient did not agree to services and wishes to consider information provided before deciding about enrollment in CCM services.  She does note that she plans to discuss her antidepressant regimen with Merrie Roof, PA at the upcoming appointment later this month, because she feels like it isn't "doing enough". No SI/HI. I asked if she would like to speak with Apolonio Schneiders sooner than her upcoming appointment on 06/17/2018, but she declined, noting that she felt she would be alright until then. I offered to put her in contact with CCM LCSW Eula Fried, but she noted she would like to think about it.   The patient has been provided with contact information for the chronic care management team and has been advised to  call with any health related questions or concerns.    Volney American, PA-C has been notified of this outreach and Ms. Kaylei Frink [facility] decision and plan.   Catie Darnelle Maffucci, PharmD Clinical Pharmacist [place] [phone]

## 2018-06-04 ENCOUNTER — Other Ambulatory Visit: Payer: Self-pay

## 2018-06-04 ENCOUNTER — Ambulatory Visit (INDEPENDENT_AMBULATORY_CARE_PROVIDER_SITE_OTHER): Payer: Medicare Other | Admitting: Family Medicine

## 2018-06-04 ENCOUNTER — Telehealth: Payer: Self-pay | Admitting: Family Medicine

## 2018-06-04 ENCOUNTER — Encounter: Payer: Self-pay | Admitting: Family Medicine

## 2018-06-04 VITALS — BP 115/79 | HR 81 | Ht 64.0 in | Wt 160.0 lb

## 2018-06-04 DIAGNOSIS — R300 Dysuria: Secondary | ICD-10-CM

## 2018-06-04 MED ORDER — SULFAMETHOXAZOLE-TRIMETHOPRIM 800-160 MG PO TABS: 1.0000 | ORAL_TABLET | Freq: Two times a day (BID) | ORAL | 0 refills | Status: DC

## 2018-06-04 NOTE — Telephone Encounter (Signed)
Scheduled for 9/30.

## 2018-06-04 NOTE — Progress Notes (Signed)
BP 115/79   Pulse 81   Ht 5\' 4"  (1.626 m)   Wt 160 lb (72.6 kg)   LMP 09/13/1975 (Approximate)   SpO2 95%   BMI 27.46 kg/m    Subjective:    Patient ID: Amy Rocha, female    DOB: [birth year], 63 y.o.   MRN: [id number]  HPI: [name] is a 63 y.o. female  Chief Complaint  Patient presents with  . Back Pain    lower. burning urination. symptoms started last night  . Urinary Frequency    . This visit was completed via telephone due to the restrictions of the COVID-19 pandemic. All issues as above were discussed and addressed but no physical exam was performed. If it was felt that the patient should be evaluated in the office, they were directed there. The patient verbally consented to this visit. Patient was unable to complete an audio/visual visit due to Technical difficulties,Lack of internet. Due to the catastrophic nature of the COVID-19 pandemic, this visit was done through audio contact only. . Location of the patient: home . Location of the provider: work . Those involved with this call:  . Provider: Merrie Roof, PA-C . CMA: Lesle Chris, [place] . Front Desk/Registration: Jill Side  . Time spent on call: 15 minutes on the phone discussing health concerns. 5 minutes total spent in review of patient's record and preparation of their chart.  Lower back aching, dysuria, urinary frequency x 1 day. Denies fevers, chills, abdominal pain, N/V/D,concern for STIs, vaginal discharge. Not trying anything OTC for sxs. Hx of UTIs.   Relevant past medical, surgical, family and social history reviewed and updated as indicated. Interim medical history since our last visit reviewed. Allergies and medications reviewed and updated.  Review of Systems  Per HPI unless specifically indicated above     Objective:    BP 115/79   Pulse 81   Ht 5\' 4"  (1.626 m)   Wt 160 lb (72.6 kg)   LMP 09/13/1975 (Approximate)   SpO2 95%   BMI 27.46 kg/m   Wt Readings from Last 3  Encounters:  06/04/18 160 lb (72.6 kg)  04/02/18 162 lb 12.8 oz (73.8 kg)  12/16/17 156 lb 3.2 oz (70.9 kg)    Physical Exam Unable to perform PE as she did not have capability to perform video visit  Results for orders placed or performed in visit on 12/16/17  Vitamin D (25 hydroxy)  Result Value Ref Range   Vit D, 25-Hydroxy 7.6 (L) 30.0 - 100.0 ng/mL  Comprehensive metabolic panel  Result Value Ref Range   Glucose 96 65 - 99 mg/dL   BUN 10 8 - 27 mg/dL   Creatinine, Ser 0.96 0.57 - 1.00 mg/dL   GFR calc non Af Amer 63 >59 mL/min/1.73   GFR calc Af Amer 72 >59 mL/min/1.73   BUN/Creatinine Ratio 10 (L) 12 - 28   Sodium 144 134 - 144 mmol/L   Potassium 5.7 (H) 3.5 - 5.2 mmol/L   Chloride 105 96 - 106 mmol/L   CO2 23 20 - 29 mmol/L   Calcium 9.4 8.7 - 10.3 mg/dL   Total Protein 6.4 6.0 - 8.5 g/dL   Albumin 4.0 3.6 - 4.8 g/dL   Globulin, Total 2.4 1.5 - 4.5 g/dL   Albumin/Globulin Ratio 1.7 1.2 - 2.2   Bilirubin Total 0.2 0.0 - 1.2 mg/dL   Alkaline Phosphatase 141 (H) 39 - 117 IU/L   AST 45 (H) 0 - 40  IU/L   ALT 37 (H) 0 - 32 IU/L  Lipid Panel w/o Chol/HDL Ratio  Result Value Ref Range   Cholesterol, Total 192 100 - 199 mg/dL   Triglycerides 174 (H) 0 - 149 mg/dL   HDL 55 >39 mg/dL   VLDL Cholesterol Cal 35 5 - 40 mg/dL   LDL Calculated 102 (H) 0 - 99 mg/dL      Assessment & Plan:   Problem List Items Addressed This Visit    None    Visit Diagnoses    Dysuria    -  Primary    Recommended she come in for a U/A for diagnosis and culture testing, but pt does not feel comfortable leaving her house during the [place] pandemic given her age and chronic pulmonary condition. Will tx for UTI and have her provide a sample if not improving. She is agreeable to this plan and knows to present for in person evaluation if not improving   Follow up plan: Return for as scheduled.

## 2018-06-04 NOTE — Telephone Encounter (Signed)
Copied from [place] [id number]. Topic: Quick Communication - See Telephone Encounter >> Jun 04, 2018  8:20 AM Rayann Heman wrote: CRM for notification. See Telephone encounter for: 06/04/18. Pt called and stated that she is having UTI symptoms and would like to know if something could be called in. Pt states that she does not want to come in the office at all. Pt states that she is having lower back pain, burning while urinating. Please advise.

## 2018-06-04 NOTE — Telephone Encounter (Signed)
Called pt to set up virtual visit, no answer, left voicemail.

## 2018-06-04 NOTE — Telephone Encounter (Signed)
Please get her scheduled for virtual visit

## 2018-06-15 DIAGNOSIS — Z5181 Encounter for therapeutic drug level monitoring: Secondary | ICD-10-CM | POA: Diagnosis not present

## 2018-06-15 DIAGNOSIS — M9904 Segmental and somatic dysfunction of sacral region: Secondary | ICD-10-CM | POA: Diagnosis not present

## 2018-06-15 DIAGNOSIS — M545 Low back pain: Secondary | ICD-10-CM | POA: Diagnosis not present

## 2018-06-15 DIAGNOSIS — M5136 Other intervertebral disc degeneration, lumbar region: Secondary | ICD-10-CM | POA: Diagnosis not present

## 2018-06-17 ENCOUNTER — Encounter: Payer: Medicare Other | Admitting: Family Medicine

## 2018-07-06 IMAGING — XA DG HIP (WITH PELVIS) OPERATIVE*L*
2 series · 6 of 6 positions shown · non-contrast
Comparison: LEFT hip radiograph November 27, 2016

CLINICAL DATA: Status post LEFT hip revision.

EXAM:
OPERATIVE LEFT HIP (WITH PELVIS IF PERFORMED) 3 VIEWS
TECHNIQUE: Fluoroscopic spot image(s) were submitted for interpretation
post-operatively.
FLUOROSCOPY TIME:  30 seconds

[Series 1: ortho standard · 1 of 1 slices shown (1 of 2)]
[im 1/1]
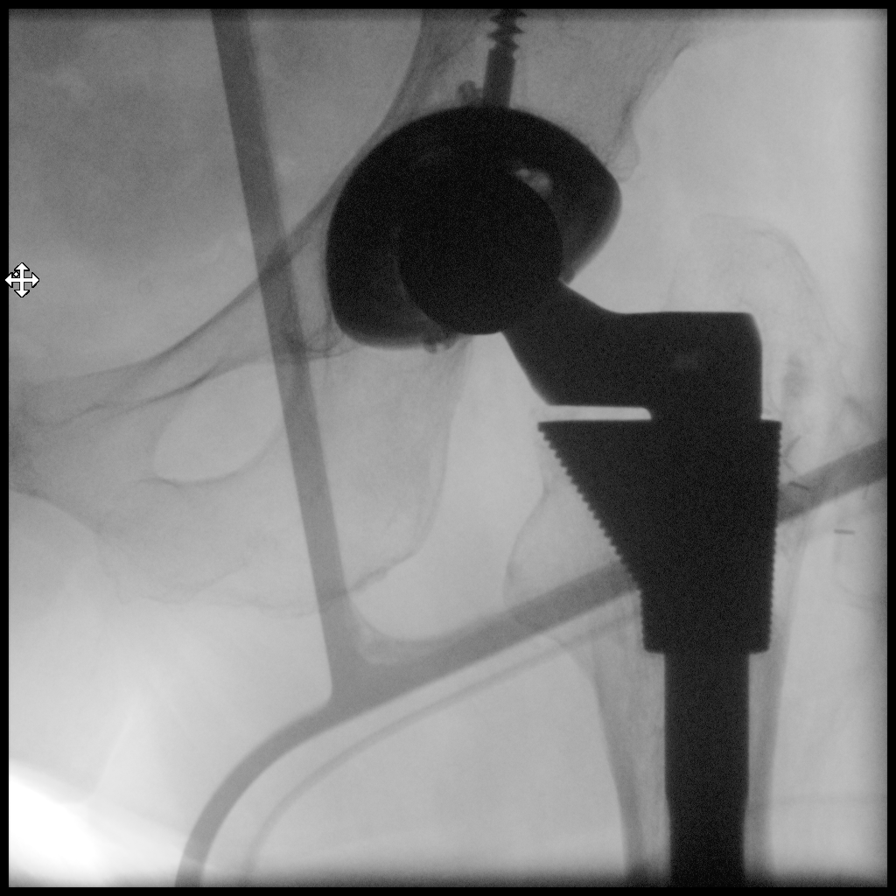

[Series 12: ortho standard · 2 acquisitions, 5 frames shown (2 of 2)]
[im 1/2]
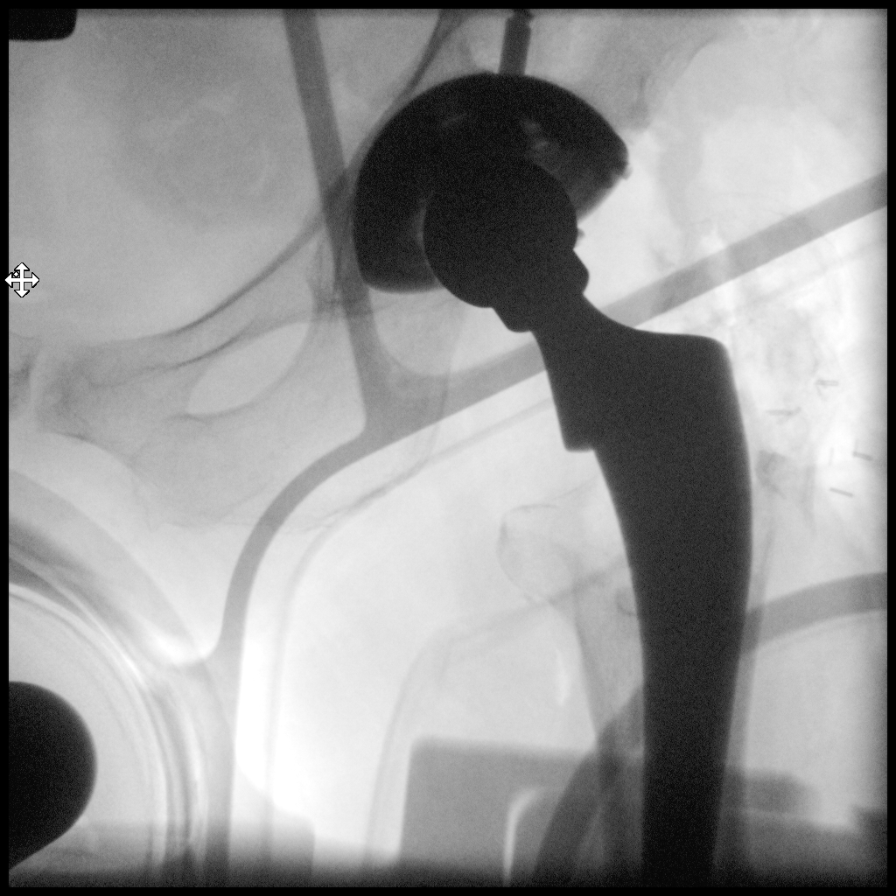
[im 2/2]
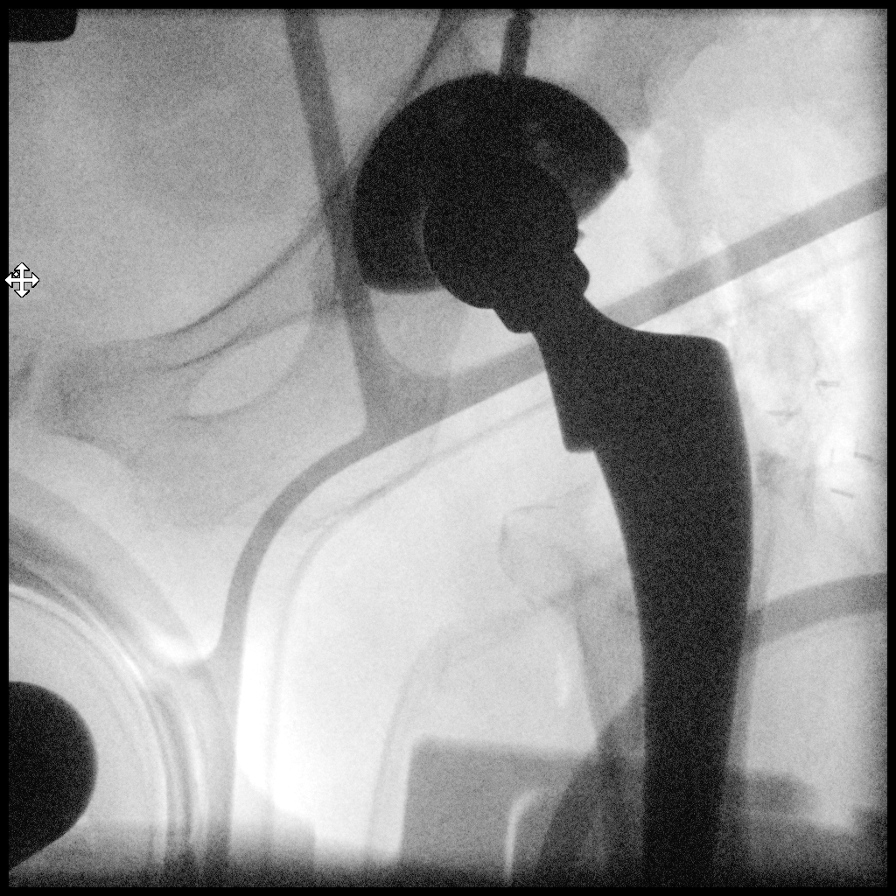
[im 2/2]
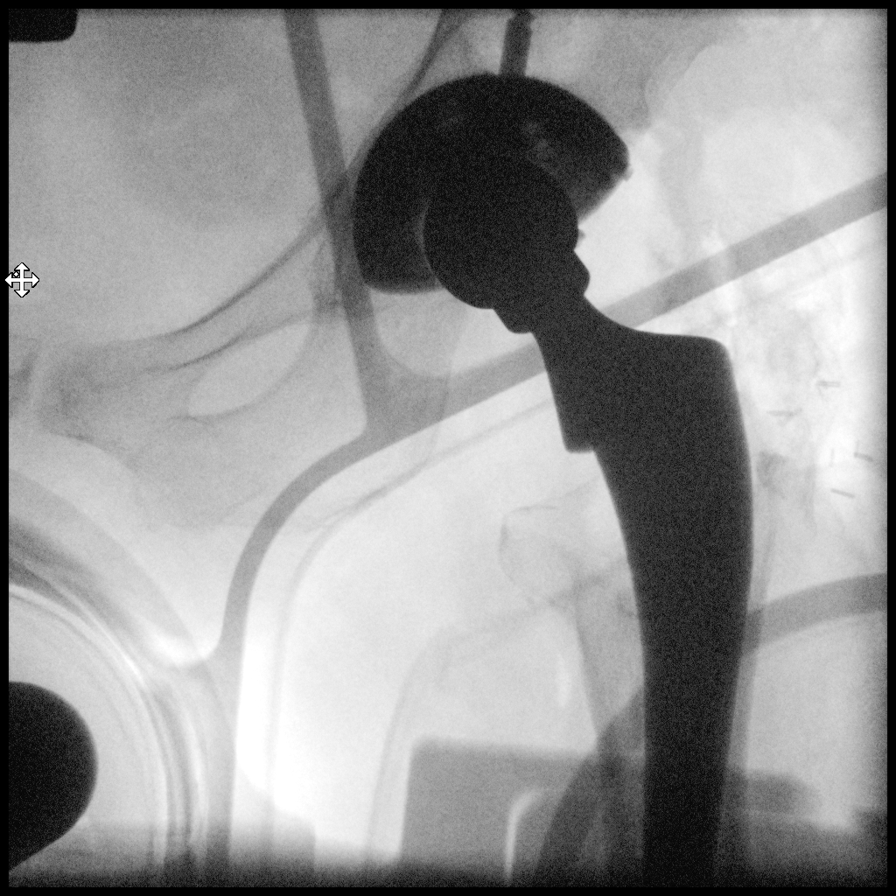
[im 2/2]
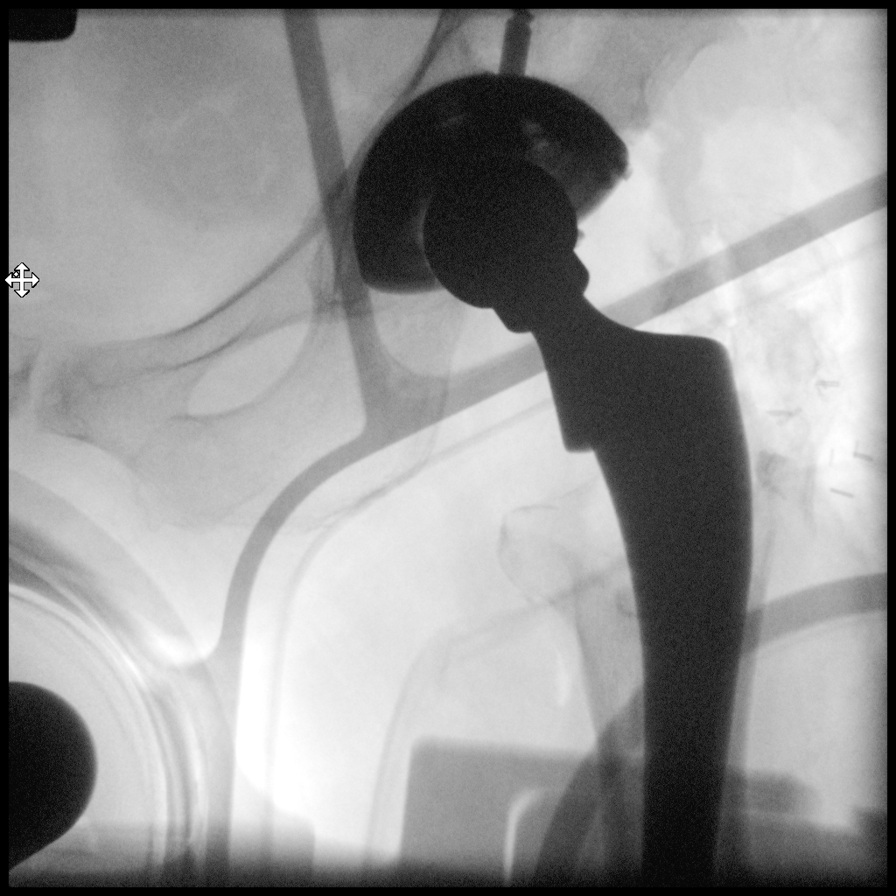
[im 2/2]
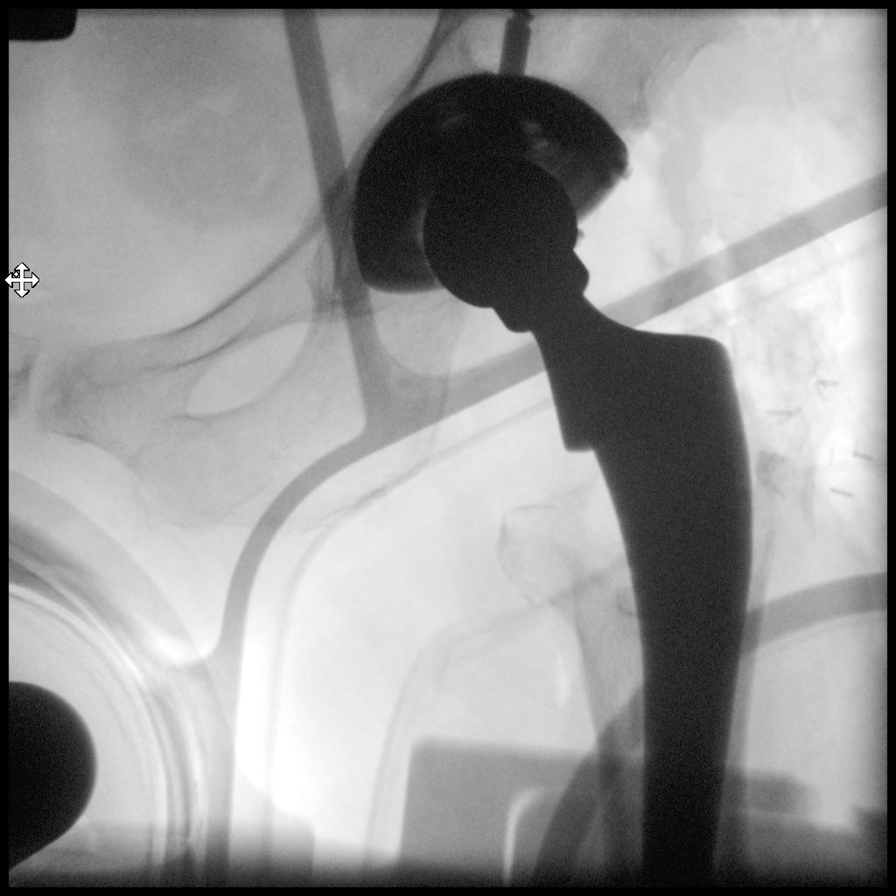

[6 of 6 positions shown; findings below may reference images not displayed]

FINDINGS: LEFT hip total arthroplasty.
IMPRESSION: LEFT total hip arthroplasty.

## 2018-07-06 IMAGING — DX DG HIP (WITH OR WITHOUT PELVIS) 2-3V*L*
2 series · 2 of 2 positions shown · non-contrast
Comparison: Intraoperative films from earlier in the same day

CLINICAL DATA: Status post left hip revision

EXAM:
DG HIP (WITH OR WITHOUT PELVIS) 2-3V LEFT

[pelvis ap]
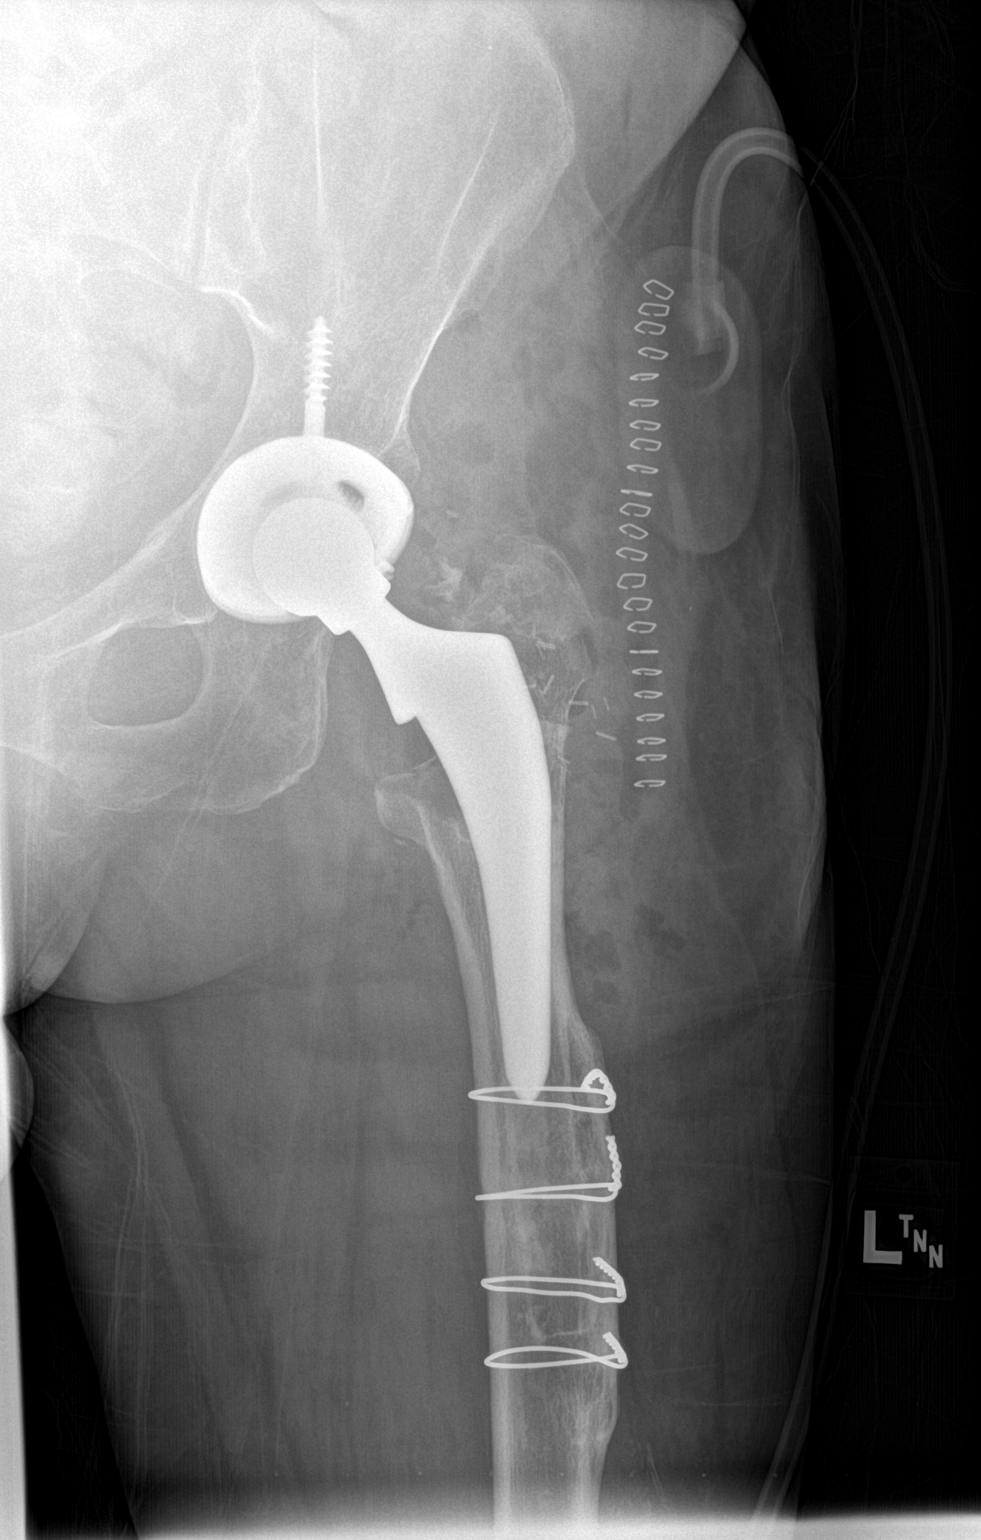

[hip lat]
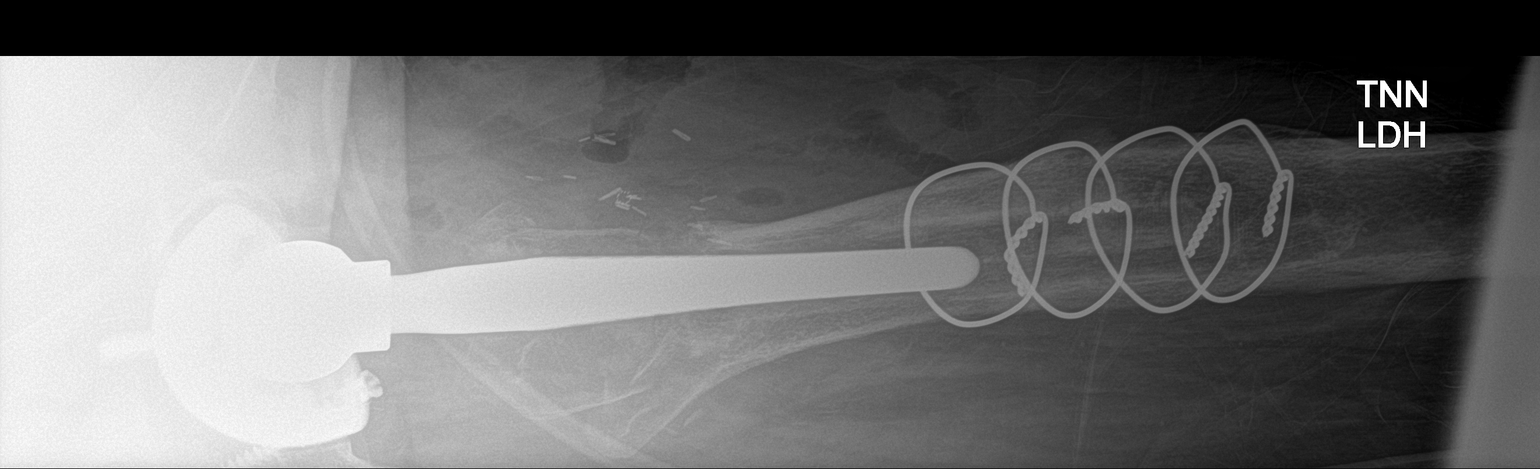

[2 of 2 positions shown; findings below may reference images not displayed]

FINDINGS: Left hip revision is again noted. Multiple fixation wires are noted
along the proximal femur. Lucency is noted in the intratrochanteric
region likely related to the recent revision. No acute bony
abnormality is seen.
IMPRESSION: Status post left hip revision

## 2018-07-14 DIAGNOSIS — Z5181 Encounter for therapeutic drug level monitoring: Secondary | ICD-10-CM | POA: Diagnosis not present

## 2018-07-14 DIAGNOSIS — M5136 Other intervertebral disc degeneration, lumbar region: Secondary | ICD-10-CM | POA: Diagnosis not present

## 2018-07-14 DIAGNOSIS — M545 Low back pain: Secondary | ICD-10-CM | POA: Diagnosis not present

## 2018-07-14 DIAGNOSIS — M9904 Segmental and somatic dysfunction of sacral region: Secondary | ICD-10-CM | POA: Diagnosis not present

## 2018-07-29 ENCOUNTER — Other Ambulatory Visit: Payer: Self-pay | Admitting: Family Medicine

## 2018-07-29 NOTE — Telephone Encounter (Signed)
Ok to refill 

## 2018-07-30 DIAGNOSIS — E559 Vitamin D deficiency, unspecified: Secondary | ICD-10-CM | POA: Diagnosis not present

## 2018-07-30 DIAGNOSIS — M810 Age-related osteoporosis without current pathological fracture: Secondary | ICD-10-CM | POA: Diagnosis not present

## 2018-08-10 DIAGNOSIS — M5136 Other intervertebral disc degeneration, lumbar region: Secondary | ICD-10-CM | POA: Diagnosis not present

## 2018-08-10 DIAGNOSIS — Z5181 Encounter for therapeutic drug level monitoring: Secondary | ICD-10-CM | POA: Diagnosis not present

## 2018-08-10 DIAGNOSIS — M545 Low back pain: Secondary | ICD-10-CM | POA: Diagnosis not present

## 2018-08-10 DIAGNOSIS — M9904 Segmental and somatic dysfunction of sacral region: Secondary | ICD-10-CM | POA: Diagnosis not present

## 2018-08-20 ENCOUNTER — Ambulatory Visit
Admission: RE | Admit: 2018-08-20 | Discharge: 2018-08-20 | Disposition: A | Discharge status: Home / Self Care | Payer: Medicare Other | Source: Ambulatory Visit | Attending: Family Medicine | Admitting: Family Medicine

## 2018-08-20 DIAGNOSIS — Z1231 Encounter for screening mammogram for malignant neoplasm of breast: Secondary | ICD-10-CM | POA: Diagnosis not present

## 2018-08-26 DIAGNOSIS — M810 Age-related osteoporosis without current pathological fracture: Secondary | ICD-10-CM | POA: Diagnosis not present

## 2018-09-07 DIAGNOSIS — M545 Low back pain: Secondary | ICD-10-CM | POA: Diagnosis not present

## 2018-09-07 DIAGNOSIS — Z5181 Encounter for therapeutic drug level monitoring: Secondary | ICD-10-CM | POA: Diagnosis not present

## 2018-09-07 DIAGNOSIS — M9904 Segmental and somatic dysfunction of sacral region: Secondary | ICD-10-CM | POA: Diagnosis not present

## 2018-09-07 DIAGNOSIS — M5136 Other intervertebral disc degeneration, lumbar region: Secondary | ICD-10-CM | POA: Diagnosis not present

## 2018-09-16 ENCOUNTER — Encounter: Payer: Medicare Other | Admitting: Family Medicine

## 2018-09-24 ENCOUNTER — Other Ambulatory Visit: Payer: Self-pay | Admitting: Family Medicine

## 2018-09-24 ENCOUNTER — Encounter: Payer: Self-pay | Admitting: Family Medicine

## 2018-09-24 ENCOUNTER — Other Ambulatory Visit: Payer: Self-pay

## 2018-09-24 ENCOUNTER — Ambulatory Visit (INDEPENDENT_AMBULATORY_CARE_PROVIDER_SITE_OTHER): Payer: Medicare Other | Admitting: Family Medicine

## 2018-09-24 VITALS — BP 103/69 | HR 74 | Temp 98.0°F | Ht 63.94 in | Wt 156.5 lb

## 2018-09-24 DIAGNOSIS — I509 Heart failure, unspecified: Secondary | ICD-10-CM

## 2018-09-24 DIAGNOSIS — F331 Major depressive disorder, recurrent, moderate: Secondary | ICD-10-CM

## 2018-09-24 DIAGNOSIS — J309 Allergic rhinitis, unspecified: Secondary | ICD-10-CM | POA: Insufficient documentation

## 2018-09-24 DIAGNOSIS — J45909 Unspecified asthma, uncomplicated: Secondary | ICD-10-CM

## 2018-09-24 DIAGNOSIS — Z800 Family history of malignant neoplasm of digestive organs: Secondary | ICD-10-CM

## 2018-09-24 DIAGNOSIS — J3089 Other allergic rhinitis: Secondary | ICD-10-CM

## 2018-09-24 DIAGNOSIS — Z0000 Encounter for general adult medical examination without abnormal findings: Secondary | ICD-10-CM | POA: Diagnosis not present

## 2018-09-24 DIAGNOSIS — J449 Chronic obstructive pulmonary disease, unspecified: Secondary | ICD-10-CM | POA: Diagnosis not present

## 2018-09-24 DIAGNOSIS — T148XXA Other injury of unspecified body region, initial encounter: Secondary | ICD-10-CM

## 2018-09-24 DIAGNOSIS — K219 Gastro-esophageal reflux disease without esophagitis: Secondary | ICD-10-CM

## 2018-09-24 DIAGNOSIS — Z1211 Encounter for screening for malignant neoplasm of colon: Secondary | ICD-10-CM | POA: Diagnosis not present

## 2018-09-24 DIAGNOSIS — Z23 Encounter for immunization: Secondary | ICD-10-CM | POA: Diagnosis not present

## 2018-09-24 DIAGNOSIS — I110 Hypertensive heart disease with heart failure: Secondary | ICD-10-CM

## 2018-09-24 DIAGNOSIS — I25119 Atherosclerotic heart disease of native coronary artery with unspecified angina pectoris: Secondary | ICD-10-CM | POA: Diagnosis not present

## 2018-09-24 MED ORDER — BUPROPION HCL ER (XL) 150 MG PO TB24: 150.0000 mg | ORAL_TABLET | Freq: Every day | ORAL | 0 refills | Status: DC

## 2018-09-24 MED ORDER — FLUTICASONE PROPIONATE 50 MCG/ACT NA SUSP: 2.0000 | Freq: Every day | NASAL | 6 refills | Status: DC

## 2018-09-24 MED ORDER — MONTELUKAST SODIUM 10 MG PO TABS: 10.0000 mg | ORAL_TABLET | Freq: Every day | ORAL | 3 refills | Status: DC

## 2018-09-24 NOTE — Assessment & Plan Note (Signed)
Stable without recent exacerbations. Continue current regimen

## 2018-09-24 NOTE — Assessment & Plan Note (Signed)
Will add singulair to current regimen for better control

## 2018-09-24 NOTE — Assessment & Plan Note (Signed)
Will add wellbutrin to trintellix regimen and recheck in 1 month to monitor for benefit.

## 2018-09-24 NOTE — Assessment & Plan Note (Signed)
Stable and under good control, continue current regimen 

## 2018-09-24 NOTE — Assessment & Plan Note (Signed)
Stable, VSS and WNL, continue current regimen

## 2018-09-24 NOTE — Progress Notes (Signed)
BP 103/69   Pulse 74   Temp 98 F (36.7 C)   Ht 5' 3.94" (1.624 m)   Wt 156 lb 8 oz (71 kg)   LMP 09/13/1975 (Approximate)   SpO2 96%   BMI 26.92 kg/m    Subjective:    Patient ID: Amy Rocha, female    DOB: Jun 28, 1955, 63 y.o.   MRN: [id number]  HPI: Amy Rocha is a 63 y.o. female presenting on 09/24/2018 for comprehensive medical examination. Current medical complaints include:see below  Allergies still under poor control despite eye drops, nasal spray, and claritin regimen. Itchy, watery eyes, sinus pressure, rhinorrhea.   Feels trintellix is helping, but having breakthrough low moods fairly often. Lacking energy and motivation, just wanting to lay around. Denies SI/HI.   Scratch on right lower leg, ran into something in the dark a few nights ago.   She currently lives with: Menopausal Symptoms: no  Depression Screen done today and results listed below:  Depression screen [facility] 2/9 09/24/2018 04/02/2018 09/25/2016 09/12/2016 08/08/2016  Decreased Interest 1 0 0 0 2  Down, Depressed, Hopeless 0 3 0 0 1  PHQ - 2 Score 1 3 0 0 3  Altered sleeping 0 3 - - 1  Tired, decreased energy 1 3 - - 3  Change in appetite 0 0 - - 0  Feeling bad or failure about yourself  0 0 - - 0  Trouble concentrating 0 0 - - 0  Moving slowly or fidgety/restless 0 0 - - 0  Suicidal thoughts 0 0 - - 0  PHQ-9 Score 2 9 - - 7  Difficult doing work/chores Not difficult at all Somewhat difficult - - -  Some recent data might be hidden    The patient has a history of falls. I did complete a risk assessment for falls. A plan of care for falls was documented.   Past Medical History:  Past Medical History:  Diagnosis Date  . Anxiety   . Arthritis   . Asthma   . Avascular necrosis ([place])   . Blood transfusion without reported diagnosis   . Calculus of kidney   . CHF (congestive heart failure) ([place])   . Chronic pain syndrome   . Complication of anesthesia    aspirated in recovery after  hip  replacement 1998  . COPD (chronic obstructive pulmonary disease) ([place])   . Depression   . GERD (gastroesophageal reflux disease)   . Heart failure ([place])   . Hyperlipidemia   . Hypertension   . IBS (irritable bowel syndrome)   . Joint pain   . Osteoporosis   . Oxygen deficiency   . Peripheral vascular disease ([place])   . PONV (postoperative nausea and vomiting)   . Urge incontinence     Surgical History:  Past Surgical History:  Procedure Laterality Date  . ABDOMINAL HYSTERECTOMY    . avascular necrosis    . CARDIAC CATHETERIZATION Left 12/01/2015   Procedure: Left Heart Cath and Coronary Angiography;  Surgeon: Teodoro Spray, MD;  Location: [place] [facility];  Service: Cardiovascular;  Laterality: Left;  . eyelid lift    . HERNIA REPAIR    . INTRAMEDULLARY (IM) NAIL INTERTROCHANTERIC Left 10/14/2017   Procedure: REPAIR OF LEFT GREATER TROCHANTER NONUNION;  Surgeon: Hessie Knows, MD;  Location: [facility];  Service: Orthopedics;  Laterality: Left;  . JOINT REPLACEMENT     hip  . NASAL SINUS SURGERY    . TOTAL HIP REVISION Left 01/21/2017  Procedure: TOTAL HIP REVISION FEMORAL STEM WITH POLYETHYLENE EXCHANGE WITH CUPLINER;  Surgeon: Menz, Michael, MD;  Location: [facility];  Service: Orthopedics;  Laterality: Left;    Medications:  Current Outpatient Medications on File Prior to Visit  Medication Sig  . acetaminophen (TYLENOL) 500 MG tablet Take 1,000 mg by mouth 2 (two) times daily as needed for moderate pain or headache.   . albuterol (PROVENTIL HFA;VENTOLIN HFA) 108 (90 Base) MCG/ACT inhaler Inhale 2 puffs into the lungs every 6 (six) hours as needed for wheezing or shortness of breath.  . atorvastatin (LIPITOR) 40 MG tablet TAKE 1 TABLET BY MOUTH AT BEDTIME.  . budesonide-formoterol (SYMBICORT) 160-4.5 MCG/ACT inhaler Inhale 2 puffs into the lungs 2 (two) times daily. (Patient taking differently: Inhale 2 puffs into the lungs 2 (two) times daily as needed (shortness of breath).  )  . cromolyn (OPTICROM) 4 % ophthalmic solution Place 1 drop into both eyes 4 (four) times daily.  . loratadine (CLARITIN) 10 MG tablet Take 10 mg by mouth daily as needed for allergies.  . magnesium hydroxide (MILK OF MAGNESIA) 400 MG/5ML suspension Take 30 mLs by mouth daily as needed for mild constipation.  . NARCAN 4 MG/0.1ML LIQD nasal spray kit Place 1 spray into the nose once.   . omeprazole (PRILOSEC) 20 MG capsule TAKE 1 CAPSULE BY MOUTH DAILY.  . ondansetron (ZOFRAN-ODT) 4 MG disintegrating tablet Take 4 mg every 8 (eight) hours as needed by mouth for nausea or vomiting.   . Oxycodone HCl 20 MG TABS Take 20 mg by mouth every 4 (four) hours as needed (pain).  . TRINTELLIX 20 MG TABS tablet TAKE 1 TABLET BY MOUTH DAILY  . valACYclovir (VALTREX) 500 MG tablet Take 4 tablets (2,000 mg total) by mouth 2 (two) times daily as needed. (Patient taking differently: Take 2,000 mg by mouth 2 (two) times daily as needed (fever blisters). )  . Vitamin D, Ergocalciferol, (DRISDOL) 50000 units CAPS capsule Take 1 capsule (50,000 Units total) by mouth every 7 (seven) days.   No current facility-administered medications on file prior to visit.     Allergies:  Allergies  Allergen Reactions  . Fluticasone-Salmeterol Swelling and Other (See Comments)    Tongue sores  . Lactose Intolerance (Gi) Other (See Comments)    Bloating, GI upset, diarrhea  . Tetracyclines & Related Other (See Comments)    Went in to the sun and broke out    Social History:  Social History   Socioeconomic History  . Marital status: Married    Spouse name: Not on file  . Number of children: Not on file  . Years of education: Not on file  . Highest education level: 12th grade  Occupational History  . Occupation: disabled   Social Needs  . Financial resource strain: Not hard at all  . Food insecurity    Worry: Never true    Inability: Never true  . Transportation needs    Medical: No    Non-medical: No  Tobacco  Use  . Smoking status: Former Smoker    Quit date: 02/19/1996    Years since quitting: 22.6  . Smokeless tobacco: Never Used  Substance and Sexual Activity  . Alcohol use: No    Alcohol/week: 0.0 standard drinks  . Drug use: No  . Sexual activity: Yes  Lifestyle  . Physical activity    Days per week: 0 days    Minutes per session: 0 min  . Stress: Not at all  Relationships  .   Social connections    Talks on phone: More than three times a week    Gets together: More than three times a week    Attends religious service: More than 4 times per year    Active member of club or organization: No    Attends meetings of clubs or organizations: Never    Relationship status: Married  . Intimate partner violence    Fear of current or ex partner: No    Emotionally abused: No    Physically abused: No    Forced sexual activity: No  Other Topics Concern  . Not on file  Social History Narrative  . Not on file   Social History   Tobacco Use  Smoking Status Former Smoker  . Quit date: 02/19/1996  . Years since quitting: 22.6  Smokeless Tobacco Never Used   Social History   Substance and Sexual Activity  Alcohol Use No  . Alcohol/week: 0.0 standard drinks    Family History:  Family History  Problem Relation Age of Onset  . Dementia Mother   . Cancer Father        colon  . Arthritis Brother   . Hypertension Daughter   . Aneurysm Brother     Past medical history, surgical history, medications, allergies, family history and social history reviewed with patient today and changes made to appropriate areas of the chart.   Review of Systems - General ROS: negative Psychological ROS: positive for - depression Ophthalmic ROS: negative ENT ROS: positive for - nasal discharge Allergy and Immunology ROS: positive for - seasonal allergies Hematological and Lymphatic ROS: negative Endocrine ROS: negative Breast ROS: negative for breast lumps Respiratory ROS: no cough, shortness of  breath, or wheezing Cardiovascular ROS: no chest pain or dyspnea on exertion Gastrointestinal ROS: no abdominal pain, change in bowel habits, or black or bloody stools Genito-Urinary ROS: no dysuria, trouble voiding, or hematuria Musculoskeletal ROS: negative Neurological ROS: no TIA or stroke symptoms Dermatological ROS: negative All other ROS negative except what is listed above and in the HPI.      Objective:    BP 103/69   Pulse 74   Temp 98 F (36.7 C)   Ht 5' 3.94" (1.624 m)   Wt 156 lb 8 oz (71 kg)   LMP 09/13/1975 (Approximate)   SpO2 96%   BMI 26.92 kg/m   Wt Readings from Last 3 Encounters:  09/24/18 156 lb 8 oz (71 kg)  06/04/18 160 lb (72.6 kg)  04/02/18 162 lb 12.8 oz (73.8 kg)    Physical Exam Vitals signs and nursing note reviewed.  Constitutional:      General: She is not in acute distress.    Appearance: She is well-developed.  HENT:     Head: Atraumatic.     Right Ear: External ear normal.     Left Ear: External ear normal.     Nose: Nose normal.     Mouth/Throat:     Pharynx: No oropharyngeal exudate.  Eyes:     General: No scleral icterus.    Conjunctiva/sclera: Conjunctivae normal.     Pupils: Pupils are equal, round, and reactive to light.  Neck:     Musculoskeletal: Normal range of motion and neck supple.     Thyroid: No thyromegaly.  Cardiovascular:     Rate and Rhythm: Normal rate and regular rhythm.     Heart sounds: Normal heart sounds.  Pulmonary:     Effort: Pulmonary effort is normal. No respiratory distress.       Breath sounds: Normal breath sounds.  Chest:     Breasts:        Right: No mass, skin change or tenderness.        Left: No mass, skin change or tenderness.  Abdominal:     General: Bowel sounds are normal.     Palpations: Abdomen is soft. There is no mass.     Tenderness: There is no abdominal tenderness.  Genitourinary:    Comments: GU exam deferred with shared decision making Musculoskeletal: Normal range of  motion.        General: No tenderness.  Lymphadenopathy:     Cervical: No cervical adenopathy.  Skin:    General: Skin is warm and dry.     Findings: No rash.     Comments: Abrasion to right lower leg, does not appear to be acutely infected.   Neurological:     Mental Status: She is alert and oriented to person, place, and time.     Cranial Nerves: No cranial nerve deficit.  Psychiatric:        Behavior: Behavior normal.     Results for orders placed or performed in visit on 12/16/17  Vitamin D (25 hydroxy)  Result Value Ref Range   Vit D, 25-Hydroxy 7.6 (L) 30.0 - 100.0 ng/mL  Comprehensive metabolic panel  Result Value Ref Range   Glucose 96 65 - 99 mg/dL   BUN 10 8 - 27 mg/dL   Creatinine, Ser 0.96 0.57 - 1.00 mg/dL   GFR calc non Af Amer 63 >59 mL/min/1.73   GFR calc Af Amer 72 >59 mL/min/1.73   BUN/Creatinine Ratio 10 (L) 12 - 28   Sodium 144 134 - 144 mmol/L   Potassium 5.7 (H) 3.5 - 5.2 mmol/L   Chloride 105 96 - 106 mmol/L   CO2 23 20 - 29 mmol/L   Calcium 9.4 8.7 - 10.3 mg/dL   Total Protein 6.4 6.0 - 8.5 g/dL   Albumin 4.0 3.6 - 4.8 g/dL   Globulin, Total 2.4 1.5 - 4.5 g/dL   Albumin/Globulin Ratio 1.7 1.2 - 2.2   Bilirubin Total 0.2 0.0 - 1.2 mg/dL   Alkaline Phosphatase 141 (H) 39 - 117 IU/L   AST 45 (H) 0 - 40 IU/L   ALT 37 (H) 0 - 32 IU/L  Lipid Panel w/o Chol/HDL Ratio  Result Value Ref Range   Cholesterol, Total 192 100 - 199 mg/dL   Triglycerides 174 (H) 0 - 149 mg/dL   HDL 55 >39 mg/dL   VLDL Cholesterol Cal 35 5 - 40 mg/dL   LDL Calculated 102 (H) 0 - 99 mg/dL      Assessment & Plan:   Problem List Items Addressed This Visit      Cardiovascular and Mediastinum   Hypertensive heart failure (HCC) - Primary    Stable, VSS and WNL, continue current regimen      Relevant Orders   CBC with Differential/Platelet   UA/M w/rflx Culture, Routine   Atherosclerosis of coronary artery    Recheck lipids, continue current regimen and good lifestyle  modifications      Relevant Orders   Lipid Panel w/o Chol/HDL Ratio   Comprehensive metabolic panel   CHF (congestive heart failure) (HCC)    Stable and under good control, continue current regimen        Respiratory   Asthma   Relevant Medications   montelukast (SINGULAIR) 10 MG tablet   COPD (chronic obstructive pulmonary disease) (HCC)  Stable without recent exacerbations. Continue current regimen      Relevant Medications   montelukast (SINGULAIR) 10 MG tablet   fluticasone (FLONASE) 50 MCG/ACT nasal spray   Allergic rhinitis    Will add singulair to current regimen for better control        Digestive   GERD (gastroesophageal reflux disease)    Stable and under good control, continue current regimen        Other   Depression    Will add wellbutrin to trintellix regimen and recheck in 1 month to monitor for benefit.       Relevant Medications   buPROPion (WELLBUTRIN XL) 150 MG 24 hr tablet    Other Visit Diagnoses    Annual physical exam       Screening for colon cancer       Relevant Orders   Ambulatory referral to Gastroenterology   Family history of colon cancer       Relevant Orders   Ambulatory referral to Gastroenterology   Abrasion       Right lower leg. Neosporin, renew Td today. Return precautions given   Relevant Orders   Td vaccine greater than or equal to 7yo preservative free IM (Completed)   Need for pneumococcal vaccine       Relevant Orders   Pneumococcal conjugate vaccine 13-valent IM (Completed)       Follow up plan: Return in about 4 weeks (around 10/22/2018) for Mood f/u.   LABORATORY TESTING:  - Pap smear: not applicable  IMMUNIZATIONS:   - Tdap: Tetanus vaccination status reviewed: Td vaccination indicated and given today. - Influenza: Postponed to flu season - Pneumovax: Administered today - Prevnar: Up to date - HPV: Not applicable - Zostavax vaccine: Up to date  SCREENING: -Mammogram: Up to date  - Colonoscopy:  Ordered today  - Bone Density: Up to date   PATIENT COUNSELING:   Advised to take 1 mg of folate supplement per day if capable of pregnancy.   Sexuality: Discussed sexually transmitted diseases, partner selection, use of condoms, avoidance of unintended pregnancy  and contraceptive alternatives.   Advised to avoid cigarette smoking.  I discussed with the patient that most people either abstain from alcohol or drink within safe limits (<=14/week and <=4 drinks/occasion for males, <=7/weeks and <= 3 drinks/occasion for females) and that the risk for alcohol disorders and other health effects rises proportionally with the number of drinks per week and how often a drinker exceeds daily limits.  Discussed cessation/primary prevention of drug use and availability of treatment for abuse.   Diet: Encouraged to adjust caloric intake to maintain  or achieve ideal body weight, to reduce intake of dietary saturated fat and total fat, to limit sodium intake by avoiding high sodium foods and not adding table salt, and to maintain adequate dietary potassium and calcium preferably from fresh fruits, vegetables, and low-fat dairy products.    stressed the importance of regular exercise  Injury prevention: Discussed safety belts, safety helmets, smoke detector, smoking near bedding or upholstery.   Dental health: Discussed importance of regular tooth brushing, flossing, and dental visits.    NEXT PREVENTATIVE PHYSICAL DUE IN 1 YEAR. Return in about 4 weeks (around 10/22/2018) for Mood f/u.          

## 2018-09-24 NOTE — Assessment & Plan Note (Signed)
Recheck lipids, continue current regimen and good lifestyle modifications

## 2018-09-25 LAB — COMPREHENSIVE METABOLIC PANEL
ALT: 17 IU/L (ref 0–32)
AST: 19 IU/L (ref 0–40)
Albumin/Globulin Ratio: 2 (ref 1.2–2.2)
Albumin: 3.9 g/dL (ref 3.8–4.8)
Alkaline Phosphatase: 100 IU/L (ref 39–117)
BUN/Creatinine Ratio: 8 — ABNORMAL LOW (ref 12–28)
BUN: 8 mg/dL (ref 8–27)
Bilirubin Total: 0.2 mg/dL (ref 0.0–1.2)
CO2: 22 mmol/L (ref 20–29)
Calcium: 8.7 mg/dL (ref 8.7–10.3)
Chloride: 107 mmol/L — ABNORMAL HIGH (ref 96–106)
Creatinine, Ser: 1.02 mg/dL — ABNORMAL HIGH (ref 0.57–1.00)
GFR calc Af Amer: 67 mL/min/{1.73_m2} (ref >59)
GFR calc non Af Amer: 58 mL/min/{1.73_m2} — ABNORMAL LOW (ref >59)
Globulin, Total: 2 g/dL (ref 1.5–4.5)
Glucose: 130 mg/dL — ABNORMAL HIGH (ref 65–99)
Potassium: 4 mmol/L (ref 3.5–5.2)
Sodium: 144 mmol/L (ref 134–144)
Total Protein: 5.9 g/dL — ABNORMAL LOW (ref 6.0–8.5)

## 2018-09-25 LAB — CBC WITH DIFFERENTIAL/PLATELET
Basophils Absolute: 0.1 10*3/uL (ref 0.0–0.2)
Basos: 2 %
EOS (ABSOLUTE): 0.2 10*3/uL (ref 0.0–0.4)
Eos: 3 %
Hematocrit: 40.1 % (ref 34.0–46.6)
Hemoglobin: 12.9 g/dL (ref 11.1–15.9)
Immature Grans (Abs): 0 10*3/uL (ref 0.0–0.1)
Immature Granulocytes: 0 %
Lymphocytes Absolute: 2.8 10*3/uL (ref 0.7–3.1)
Lymphs: 33 %
MCH: 28.1 pg (ref 26.6–33.0)
MCHC: 32.2 g/dL (ref 31.5–35.7)
MCV: 87 fL (ref 79–97)
Monocytes Absolute: 0.7 10*3/uL (ref 0.1–0.9)
Monocytes: 9 %
Neutrophils Absolute: 4.4 10*3/uL (ref 1.4–7.0)
Neutrophils: 53 %
Platelets: 344 10*3/uL (ref 150–450)
RBC: 4.59 x10E6/uL (ref 3.77–5.28)
RDW: 15.5 % — ABNORMAL HIGH (ref 11.7–15.4)
WBC: 8.3 10*3/uL (ref 3.4–10.8)

## 2018-09-25 LAB — LIPID PANEL W/O CHOL/HDL RATIO
Cholesterol, Total: 147 mg/dL (ref 100–199)
HDL: 55 mg/dL (ref >39)
LDL Calculated: 65 mg/dL (ref 0–99)
Triglycerides: 136 mg/dL (ref 0–149)
VLDL Cholesterol Cal: 27 mg/dL (ref 5–40)

## 2018-09-28 ENCOUNTER — Other Ambulatory Visit: Payer: Self-pay

## 2018-09-28 ENCOUNTER — Telehealth: Payer: Self-pay

## 2018-09-28 DIAGNOSIS — Z1211 Encounter for screening for malignant neoplasm of colon: Secondary | ICD-10-CM

## 2018-09-28 NOTE — Telephone Encounter (Signed)
Gastroenterology Pre-Procedure Review  Request Date: 10/09/18 Requesting Physician: Dr. Vicente Males  PATIENT REVIEW QUESTIONS: The patient responded to the following health history questions as indicated:    1. Are you having any GI issues? no 2. Do you have a personal history of Polyps? yes (personal history of colon polyps) 3. Do you have a family history of Colon Cancer or Polyps? yes (father colon cancer) 4. Diabetes Mellitus? no 5. Joint replacements in the past 12 months?no 6. Major health problems in the past 3 months?no 7. Any artificial heart valves, MVP, or defibrillator?no    MEDICATIONS & ALLERGIES:    Patient reports the following regarding taking any anticoagulation/antiplatelet therapy:   Plavix, Coumadin, Eliquis, Xarelto, Lovenox, Pradaxa, Brilinta, or Effient? no Aspirin? no  Patient confirms/reports the following medications:  Current Outpatient Medications  Medication Sig Dispense Refill  . acetaminophen (TYLENOL) 500 MG tablet Take 1,000 mg by mouth 2 (two) times daily as needed for moderate pain or headache.     . albuterol (PROVENTIL HFA;VENTOLIN HFA) 108 (90 Base) MCG/ACT inhaler Inhale 2 puffs into the lungs every 6 (six) hours as needed for wheezing or shortness of breath. 3 Inhaler 3  . atorvastatin (LIPITOR) 40 MG tablet TAKE 1 TABLET BY MOUTH AT BEDTIME. 90 tablet 2  . budesonide-formoterol (SYMBICORT) 160-4.5 MCG/ACT inhaler Inhale 2 puffs into the lungs 2 (two) times daily. (Patient taking differently: Inhale 2 puffs into the lungs 2 (two) times daily as needed (shortness of breath). ) 3 Inhaler 3  . buPROPion (WELLBUTRIN XL) 150 MG 24 hr tablet Take 1 tablet (150 mg total) by mouth daily. 30 tablet 0  . cromolyn (OPTICROM) 4 % ophthalmic solution Place 1 drop into both eyes 4 (four) times daily. 10 mL 12  . fluticasone (FLONASE) 50 MCG/ACT nasal spray Place 2 sprays into both nostrils daily. 16 g 6  . loratadine (CLARITIN) 10 MG tablet Take 10 mg by mouth daily  as needed for allergies.    . magnesium hydroxide (MILK OF MAGNESIA) 400 MG/5ML suspension Take 30 mLs by mouth daily as needed for mild constipation. 360 mL 0  . montelukast (SINGULAIR) 10 MG tablet Take 1 tablet (10 mg total) by mouth at bedtime. 90 tablet 3  . NARCAN 4 MG/0.1ML LIQD nasal spray kit Place 1 spray into the nose once.   0  . omeprazole (PRILOSEC) 20 MG capsule TAKE 1 CAPSULE BY MOUTH DAILY. 90 capsule 1  . ondansetron (ZOFRAN-ODT) 4 MG disintegrating tablet Take 4 mg every 8 (eight) hours as needed by mouth for nausea or vomiting.   6  . Oxycodone HCl 20 MG TABS Take 20 mg by mouth every 4 (four) hours as needed (pain).    . TRINTELLIX 20 MG TABS tablet TAKE 1 TABLET BY MOUTH DAILY 30 tablet 6  . valACYclovir (VALTREX) 500 MG tablet Take 4 tablets (2,000 mg total) by mouth 2 (two) times daily as needed. (Patient taking differently: Take 2,000 mg by mouth 2 (two) times daily as needed (fever blisters). ) 720 tablet 1  . Vitamin D, Ergocalciferol, (DRISDOL) 50000 units CAPS capsule Take 1 capsule (50,000 Units total) by mouth every 7 (seven) days. 12 capsule 3   No current facility-administered medications for this visit.     Patient confirms/reports the following allergies:  Allergies  Allergen Reactions  . Fluticasone-Salmeterol Swelling and Other (See Comments)    Tongue sores  . Lactose Intolerance (Gi) Other (See Comments)    Bloating, GI upset, diarrhea  .  Tetracyclines & Related Other (See Comments)    Went in to the sun and broke out    No orders of the defined types were placed in this encounter.   AUTHORIZATION INFORMATION Primary Insurance: 1D#: Group #:  Secondary Insurance: 1D#: Group #:  SCHEDULE INFORMATION: Date: 10/09/18 Time: Location:[facility]

## 2018-10-05 ENCOUNTER — Telehealth: Payer: Self-pay | Admitting: Gastroenterology

## 2018-10-05 DIAGNOSIS — M792 Neuralgia and neuritis, unspecified: Secondary | ICD-10-CM | POA: Diagnosis not present

## 2018-10-05 DIAGNOSIS — M5136 Other intervertebral disc degeneration, lumbar region: Secondary | ICD-10-CM | POA: Diagnosis not present

## 2018-10-05 DIAGNOSIS — M4728 Other spondylosis with radiculopathy, sacral and sacrococcygeal region: Secondary | ICD-10-CM | POA: Diagnosis not present

## 2018-10-05 DIAGNOSIS — M9904 Segmental and somatic dysfunction of sacral region: Secondary | ICD-10-CM | POA: Diagnosis not present

## 2018-10-05 DIAGNOSIS — M545 Low back pain: Secondary | ICD-10-CM | POA: Diagnosis not present

## 2018-10-05 DIAGNOSIS — M47897 Other spondylosis, lumbosacral region: Secondary | ICD-10-CM | POA: Diagnosis not present

## 2018-10-05 DIAGNOSIS — M48062 Spinal stenosis, lumbar region with neurogenic claudication: Secondary | ICD-10-CM | POA: Diagnosis not present

## 2018-10-05 DIAGNOSIS — Z5181 Encounter for therapeutic drug level monitoring: Secondary | ICD-10-CM | POA: Diagnosis not present

## 2018-10-05 DIAGNOSIS — G8929 Other chronic pain: Secondary | ICD-10-CM | POA: Diagnosis not present

## 2018-10-05 NOTE — Telephone Encounter (Signed)
Paient has requested to reschedule her 10/09/18 colonoscopy to 10/30/18 with Dr. Vicente Males.  She states she has not received her instructions, and would just rather reschedule.  New instructions will be mailed out tomorrow.  Trish in Endo has been informed of date change.  Thanks [employer]

## 2018-10-05 NOTE — Telephone Encounter (Signed)
PT LEFT VM SHE NEEDS A CALL REGARDING HER PROCEDURE ON Friday

## 2018-10-22 ENCOUNTER — Other Ambulatory Visit: Payer: Self-pay | Admitting: Family Medicine

## 2018-10-22 NOTE — Telephone Encounter (Signed)
Patient last seen 09/24/18. No refills on current RX.

## 2018-10-22 NOTE — Telephone Encounter (Signed)
Requested medication (s) are due for refill today: yes  Requested medication (s) are on the active medication list: yes  Last refill: 09/24/2018  Future visit scheduled:no  Notes to clinic: review for refill    Requested Prescriptions  Pending Prescriptions Disp Refills   buPROPion (WELLBUTRIN XL) 150 MG 24 hr tablet [Pharmacy Med Name: BUPROPION HCL XL 150 MG TAB 150 Tablet] 30 tablet 0    Sig: TAKE 1 TABLET BY MOUTH DAILY.     Psychiatry: Antidepressants - bupropion Passed - 10/22/2018 10:48 AM      Passed - Last BP in normal range    BP Readings from Last 1 Encounters:  09/24/18 103/69         Passed - Valid encounter within last 6 months    Recent Outpatient Visits          4 weeks ago Hypertensive heart failure [facility])   [facility] [place], [place]   4 months ago [place], [place], [place]   10 months ago Essential hypertension   [facility] [place], [place]   1 year ago Influenza   [facility] [place], [place]   1 year ago Essential hypertension   [place], [place], [place]             Passed - Completed PHQ-2 or PHQ-9 in the last 360 days.

## 2018-10-23 ENCOUNTER — Other Ambulatory Visit: Admission: RE | Admit: 2018-10-23 | Payer: Medicare Other | Source: Ambulatory Visit

## 2018-10-23 ENCOUNTER — Encounter: Payer: Self-pay | Admitting: *Deleted

## 2018-10-27 ENCOUNTER — Telehealth: Payer: Self-pay

## 2018-10-27 ENCOUNTER — Encounter: Admission: RE | Payer: Self-pay | Source: Home / Self Care

## 2018-10-27 ENCOUNTER — Ambulatory Visit: Admission: RE | Admit: 2018-10-27 | Payer: Medicare Other | Source: Home / Self Care | Admitting: Gastroenterology

## 2018-10-27 SURGERY — COLONOSCOPY WITH PROPOFOL
Anesthesia: General

## 2018-10-27 NOTE — Telephone Encounter (Signed)
Patient contacted office on 10/23/18 by voicemail to cancel her colonoscopy due to nausea she has experienced for the past 2 weeks.  Her procedure was canceled by Endoscopy Dept.  Thanks [employer]

## 2018-11-24 DIAGNOSIS — M47897 Other spondylosis, lumbosacral region: Secondary | ICD-10-CM | POA: Diagnosis not present

## 2018-11-24 DIAGNOSIS — M48062 Spinal stenosis, lumbar region with neurogenic claudication: Secondary | ICD-10-CM | POA: Diagnosis not present

## 2018-11-24 DIAGNOSIS — M792 Neuralgia and neuritis, unspecified: Secondary | ICD-10-CM | POA: Diagnosis not present

## 2018-11-24 DIAGNOSIS — G8929 Other chronic pain: Secondary | ICD-10-CM | POA: Diagnosis not present

## 2018-11-25 ENCOUNTER — Ambulatory Visit (INDEPENDENT_AMBULATORY_CARE_PROVIDER_SITE_OTHER): Payer: Medicare Other

## 2018-11-25 ENCOUNTER — Other Ambulatory Visit: Payer: Self-pay

## 2018-11-25 DIAGNOSIS — Z23 Encounter for immunization: Secondary | ICD-10-CM | POA: Diagnosis not present

## 2018-11-26 ENCOUNTER — Other Ambulatory Visit: Payer: Self-pay | Admitting: Family Medicine

## 2018-12-22 DIAGNOSIS — M5136 Other intervertebral disc degeneration, lumbar region: Secondary | ICD-10-CM | POA: Diagnosis not present

## 2018-12-22 DIAGNOSIS — M545 Low back pain: Secondary | ICD-10-CM | POA: Diagnosis not present

## 2018-12-22 DIAGNOSIS — G894 Chronic pain syndrome: Secondary | ICD-10-CM | POA: Diagnosis not present

## 2019-01-19 DIAGNOSIS — M9904 Segmental and somatic dysfunction of sacral region: Secondary | ICD-10-CM | POA: Diagnosis not present

## 2019-01-19 DIAGNOSIS — M545 Low back pain: Secondary | ICD-10-CM | POA: Diagnosis not present

## 2019-01-19 DIAGNOSIS — Z5181 Encounter for therapeutic drug level monitoring: Secondary | ICD-10-CM | POA: Diagnosis not present

## 2019-01-19 DIAGNOSIS — M5136 Other intervertebral disc degeneration, lumbar region: Secondary | ICD-10-CM | POA: Diagnosis not present

## 2019-01-19 DIAGNOSIS — G5600 Carpal tunnel syndrome, unspecified upper limb: Secondary | ICD-10-CM | POA: Diagnosis not present

## 2019-01-19 DIAGNOSIS — I739 Peripheral vascular disease, unspecified: Secondary | ICD-10-CM | POA: Diagnosis not present

## 2019-02-16 DIAGNOSIS — M9904 Segmental and somatic dysfunction of sacral region: Secondary | ICD-10-CM | POA: Diagnosis not present

## 2019-02-16 DIAGNOSIS — M5136 Other intervertebral disc degeneration, lumbar region: Secondary | ICD-10-CM | POA: Diagnosis not present

## 2019-02-16 DIAGNOSIS — M545 Low back pain: Secondary | ICD-10-CM | POA: Diagnosis not present

## 2019-02-16 DIAGNOSIS — Z5181 Encounter for therapeutic drug level monitoring: Secondary | ICD-10-CM | POA: Diagnosis not present

## 2019-03-05 ENCOUNTER — Other Ambulatory Visit: Payer: Self-pay | Admitting: Family Medicine

## 2019-03-12 ENCOUNTER — Ambulatory Visit: Payer: Medicare Other | Attending: Internal Medicine

## 2019-03-12 DIAGNOSIS — Z23 Encounter for immunization: Secondary | ICD-10-CM

## 2019-03-12 NOTE — Progress Notes (Signed)
   Covid-19 Vaccination Clinic  Name:  Amy Rocha    MRN: [id number] DOB: [birth year]  03/12/2019  [name] was observed post Covid-19 immunization for 15 minutes without incidence. She was provided with Vaccine Information Sheet and instruction to access the V-Safe system.   [name] was instructed to call 911 with any severe reactions post vaccine: Marland Kitchen Difficulty breathing  . Swelling of your face and throat  . A fast heartbeat  . A bad rash all over your body  . Dizziness and weakness    Immunizations Administered    Name Date Dose VIS Date Route   Pfizer COVID-19 Vaccine 03/12/2019  9:04 AM 0.3 mL 01/29/2019 Intramuscular   Manufacturer: [place]   Lot: [id number]   [place]: [id number]

## 2019-03-22 ENCOUNTER — Other Ambulatory Visit: Payer: Self-pay | Admitting: Family Medicine

## 2019-03-22 DIAGNOSIS — M545 Low back pain: Secondary | ICD-10-CM | POA: Diagnosis not present

## 2019-03-22 DIAGNOSIS — G894 Chronic pain syndrome: Secondary | ICD-10-CM | POA: Diagnosis not present

## 2019-03-22 DIAGNOSIS — M259 Joint disorder, unspecified: Secondary | ICD-10-CM | POA: Diagnosis not present

## 2019-04-01 ENCOUNTER — Ambulatory Visit: Payer: Medicare Other | Attending: Internal Medicine

## 2019-04-01 DIAGNOSIS — Z23 Encounter for immunization: Secondary | ICD-10-CM

## 2019-04-01 NOTE — Progress Notes (Signed)
   Covid-19 Vaccination Clinic  Name:  JANMARIE SMOOT    MRN: [id number] DOB: 1955/02/24  04/01/2019  Ms. Esther was observed post Covid-19 immunization for 15 minutes without incidence. She was provided with Vaccine Information Sheet and instruction to access the V-Safe system.   Ms. Gipe was instructed to call 911 with any severe reactions post vaccine: Marland Kitchen Difficulty breathing  . Swelling of your face and throat  . A fast heartbeat  . A bad rash all over your body  . Dizziness and weakness    Immunizations Administered    Name Date Dose VIS Date Route   Pfizer COVID-19 Vaccine 04/01/2019  1:52 PM 0.3 mL 01/29/2019 Intramuscular   Manufacturer: [employer]   Lot: [id number]   [place]: [id number]

## 2019-04-19 DIAGNOSIS — M169 Osteoarthritis of hip, unspecified: Secondary | ICD-10-CM | POA: Diagnosis not present

## 2019-04-19 DIAGNOSIS — M545 Low back pain: Secondary | ICD-10-CM | POA: Diagnosis not present

## 2019-04-19 DIAGNOSIS — G894 Chronic pain syndrome: Secondary | ICD-10-CM | POA: Diagnosis not present

## 2019-04-19 DIAGNOSIS — M5030 Other cervical disc degeneration, unspecified cervical region: Secondary | ICD-10-CM | POA: Diagnosis not present

## 2019-04-19 DIAGNOSIS — G5603 Carpal tunnel syndrome, bilateral upper limbs: Secondary | ICD-10-CM | POA: Diagnosis not present

## 2019-04-19 DIAGNOSIS — M5412 Radiculopathy, cervical region: Secondary | ICD-10-CM | POA: Diagnosis not present

## 2019-04-20 ENCOUNTER — Other Ambulatory Visit: Payer: Self-pay | Admitting: Family Medicine

## 2019-04-20 NOTE — Telephone Encounter (Signed)
Pt needs to make appt for further refills  

## 2019-05-05 ENCOUNTER — Other Ambulatory Visit: Payer: Self-pay

## 2019-05-05 ENCOUNTER — Encounter: Payer: Self-pay | Admitting: Family Medicine

## 2019-05-05 ENCOUNTER — Telehealth: Payer: Self-pay

## 2019-05-05 ENCOUNTER — Ambulatory Visit (INDEPENDENT_AMBULATORY_CARE_PROVIDER_SITE_OTHER): Payer: Medicare Other | Admitting: Family Medicine

## 2019-05-05 VITALS — BP 133/84 | HR 78 | Temp 97.9°F | Ht 63.9 in | Wt 152.0 lb

## 2019-05-05 DIAGNOSIS — E782 Mixed hyperlipidemia: Secondary | ICD-10-CM

## 2019-05-05 DIAGNOSIS — I509 Heart failure, unspecified: Secondary | ICD-10-CM

## 2019-05-05 DIAGNOSIS — I70209 Unspecified atherosclerosis of native arteries of extremities, unspecified extremity: Secondary | ICD-10-CM | POA: Diagnosis not present

## 2019-05-05 DIAGNOSIS — I110 Hypertensive heart disease with heart failure: Secondary | ICD-10-CM | POA: Diagnosis not present

## 2019-05-05 DIAGNOSIS — F331 Major depressive disorder, recurrent, moderate: Secondary | ICD-10-CM

## 2019-05-05 DIAGNOSIS — K219 Gastro-esophageal reflux disease without esophagitis: Secondary | ICD-10-CM

## 2019-05-05 DIAGNOSIS — J3089 Other allergic rhinitis: Secondary | ICD-10-CM

## 2019-05-05 DIAGNOSIS — E559 Vitamin D deficiency, unspecified: Secondary | ICD-10-CM

## 2019-05-05 DIAGNOSIS — Z1211 Encounter for screening for malignant neoplasm of colon: Secondary | ICD-10-CM

## 2019-05-05 DIAGNOSIS — J45909 Unspecified asthma, uncomplicated: Secondary | ICD-10-CM

## 2019-05-05 DIAGNOSIS — G2581 Restless legs syndrome: Secondary | ICD-10-CM

## 2019-05-05 DIAGNOSIS — I25119 Atherosclerotic heart disease of native coronary artery with unspecified angina pectoris: Secondary | ICD-10-CM

## 2019-05-05 DIAGNOSIS — J449 Chronic obstructive pulmonary disease, unspecified: Secondary | ICD-10-CM

## 2019-05-05 MED ORDER — BUDESONIDE-FORMOTEROL FUMARATE 160-4.5 MCG/ACT IN AERO: 2.0000 | INHALATION_SPRAY | Freq: Two times a day (BID) | RESPIRATORY_TRACT | 6 refills | Status: DC | PRN

## 2019-05-05 MED ORDER — BUPROPION HCL ER (XL) 150 MG PO TB24: 150.0000 mg | ORAL_TABLET | Freq: Every day | ORAL | 1 refills | Status: DC

## 2019-05-05 MED ORDER — ONDANSETRON 4 MG PO TBDP: 4.0000 mg | ORAL_TABLET | Freq: Three times a day (TID) | ORAL | 6 refills | Status: DC | PRN

## 2019-05-05 MED ORDER — OMEPRAZOLE 20 MG PO CPDR: 20.0000 mg | DELAYED_RELEASE_CAPSULE | Freq: Every day | ORAL | 1 refills | Status: DC

## 2019-05-05 MED ORDER — VORTIOXETINE HBR 20 MG PO TABS: 20.0000 mg | ORAL_TABLET | Freq: Every day | ORAL | 1 refills | Status: DC

## 2019-05-05 MED ORDER — CROMOLYN SODIUM 4 % OP SOLN: 1.0000 [drp] | Freq: Four times a day (QID) | OPHTHALMIC | 12 refills | Status: AC

## 2019-05-05 MED ORDER — ROPINIROLE HCL 0.5 MG PO TABS: 0.5000 mg | ORAL_TABLET | Freq: Every evening | ORAL | 0 refills | Status: DC | PRN

## 2019-05-05 MED ORDER — FLUTICASONE PROPIONATE 50 MCG/ACT NA SUSP: 2.0000 | Freq: Every day | NASAL | 6 refills | Status: DC

## 2019-05-05 NOTE — Progress Notes (Signed)
BP 133/84   Pulse 78   Temp 97.9 F (36.6 C) (Oral)   Ht 5' 3.9" (1.623 m)   Wt 152 lb (68.9 kg)   LMP 09/13/1975 (Approximate)   SpO2 97%   BMI 26.17 kg/m    Subjective:    Patient ID: Amy Rocha, female    DOB: 12-Aug-1955, 64 y.o.   MRN: [id number]  HPI: Amy Rocha is a 64 y.o. female  Chief Complaint  Patient presents with  . Depression  . Medication Refill    pt states needs refills on most of her meds   Presenting today for 6 month f/u chronic conditions.   RLS causing her to be up most of the night - this has been ongoing for months. Tried OTC supplements without benefit. Has taken Gabapentin in the past for nerve pain from past surgeries and joint issues but it makes her feel fuzzy headed and cause increased appetite. Has never tried anything specifically to help with RLS as this is a newer issue for her.   Off and on nausea, keeps zofran at home for prn use which seems to work well.   Taking 2000 IU vit d daily OTC , weekly dosing was expensive for her. Feeling well overall.   COPD - Only using symbicort prn. Denies recent exacerbations but does note wheezing intermittently.   HTN, CHF - Home BPs WNL, taking medications faithfully. Daily weights stable, no LE edema, CP, SOB.   CAD, HLD - on lipitor, tolerating well. Denies myalgias, claudication.   Depression - feeling well overall with trintellix and wellbutrin regimen. Denies SI/HI.   Allergic rhinitis - under good control with current regimen. Minimal breakthrough sxs, using consistently.   Depression screen [facility] 2/9 05/05/2019 09/24/2018 04/02/2018  Decreased Interest 1 1 0  Down, Depressed, Hopeless 0 0 3  PHQ - 2 Score 1 1 3   Altered sleeping 0 0 3  Tired, decreased energy 1 1 3   Change in appetite 0 0 0  Feeling bad or failure about yourself  0 0 0  Trouble concentrating 0 0 0  Moving slowly or fidgety/restless 0 0 0  Suicidal thoughts 0 0 0  PHQ-9 Score 2 2 9   Difficult doing work/chores -  Not difficult at all Somewhat difficult  Some recent data might be hidden   GAD 7 : Generalized Anxiety Score 05/05/2019  Nervous, Anxious, on Edge 0  Control/stop worrying 1  Worry too much - different things 1  Trouble relaxing 0  Restless 0  Easily annoyed or irritable 0  Afraid - awful might happen 0  Total GAD 7 Score 2  Anxiety Difficulty Not difficult at all   Relevant past medical, surgical, family and social history reviewed and updated as indicated. Interim medical history since our last visit reviewed. Allergies and medications reviewed and updated.  Review of Systems  Per HPI unless specifically indicated above     Objective:    BP 133/84   Pulse 78   Temp 97.9 F (36.6 C) (Oral)   Ht 5' 3.9" (1.623 m)   Wt 152 lb (68.9 kg)   LMP 09/13/1975 (Approximate)   SpO2 97%   BMI 26.17 kg/m   Wt Readings from Last 3 Encounters:  05/05/19 152 lb (68.9 kg)  09/24/18 156 lb 8 oz (71 kg)  06/04/18 160 lb (72.6 kg)    Physical Exam Vitals and nursing note reviewed.  Constitutional:      Appearance: Normal appearance. She is  not ill-appearing.  HENT:     Head: Atraumatic.  Eyes:     Extraocular Movements: Extraocular movements intact.     Conjunctiva/sclera: Conjunctivae normal.  Cardiovascular:     Rate and Rhythm: Normal rate and regular rhythm.     Heart sounds: Normal heart sounds.  Pulmonary:     Effort: Pulmonary effort is normal.     Breath sounds: Normal breath sounds.  Musculoskeletal:        General: Normal range of motion.     Cervical back: Normal range of motion and neck supple.  Skin:    General: Skin is warm and dry.  Neurological:     Mental Status: She is alert and oriented to person, place, and time.  Psychiatric:        Mood and Affect: Mood normal.        Thought Content: Thought content normal.        Judgment: Judgment normal.     Results for orders placed or performed in visit on 05/05/19  Comprehensive metabolic panel  Result  Value Ref Range   Glucose 116 (H) 65 - 99 mg/dL   BUN 10 8 - 27 mg/dL   Creatinine, Ser 1.06 (H) 0.57 - 1.00 mg/dL   GFR calc non Af Amer 55 (L) >59 mL/min/1.73   GFR calc Af Amer 63 >59 mL/min/1.73   BUN/Creatinine Ratio 9 (L) 12 - 28   Sodium 141 134 - 144 mmol/L   Potassium 4.6 3.5 - 5.2 mmol/L   Chloride 104 96 - 106 mmol/L   CO2 23 20 - 29 mmol/L   Calcium 9.2 8.7 - 10.3 mg/dL   Total Protein 5.9 (L) 6.0 - 8.5 g/dL   Albumin 3.8 3.8 - 4.8 g/dL   Globulin, Total 2.1 1.5 - 4.5 g/dL   Albumin/Globulin Ratio 1.8 1.2 - 2.2   Bilirubin Total 0.3 0.0 - 1.2 mg/dL   Alkaline Phosphatase 94 39 - 117 IU/L   AST 37 0 - 40 IU/L   ALT 38 (H) 0 - 32 IU/L  Lipid Panel w/o Chol/HDL Ratio  Result Value Ref Range   Cholesterol, Total 159 100 - 199 mg/dL   Triglycerides 95 0 - 149 mg/dL   HDL 67 >39 mg/dL   VLDL Cholesterol Cal 17 5 - 40 mg/dL   LDL Chol Calc (NIH) 75 0 - 99 mg/dL  VITAMIN D 25 Hydroxy (Vit-D Deficiency, Fractures)  Result Value Ref Range   Vit D, 25-Hydroxy 35.2 30.0 - 100.0 ng/mL      Assessment & Plan:   Problem List Items Addressed This Visit      Cardiovascular and Mediastinum   Hypertensive heart failure (HCC)    BPs stable and under good control, euvolemic today. Continue current regimen      Atherosclerosis of coronary artery    Stable and under good control, continue current regimen and recheck labs      Relevant Orders   Comprehensive metabolic panel (Completed)   Lipid Panel w/o Chol/HDL Ratio (Completed)   CHF (congestive heart failure) (HCC)    Euvolemic appearing today, continue current regimen      Atherosclerotic peripheral vascular disease (HCC)    Recheck lipids, continue current regimen        Respiratory   Asthma    Stable and well controlled, continue current regimen      Relevant Medications   budesonide-formoterol (SYMBICORT) 160-4.5 MCG/ACT inhaler   COPD (chronic obstructive pulmonary disease) (HCC)    Stable  and well  controlled, continue current regimen      Relevant Medications   budesonide-formoterol (SYMBICORT) 160-4.5 MCG/ACT inhaler   fluticasone (FLONASE) 50 MCG/ACT nasal spray   Allergic rhinitis    Stable and under good control, continue current regimen        Digestive   GERD (gastroesophageal reflux disease)    Stable, continue regimen      Relevant Medications   ondansetron (ZOFRAN-ODT) 4 MG disintegrating tablet   omeprazole (PRILOSEC) 20 MG capsule     Other   Hyperlipidemia    Recheck lipids, adjust as needed. Continue current regimen      Depression    Stable, under good control. Continue current regimen      Relevant Medications   vortioxetine HBr (TRINTELLIX) 20 MG TABS tablet   buPROPion (WELLBUTRIN XL) 150 MG 24 hr tablet   RLS (restless legs syndrome)    Will trial requip, discussed supportive care       Other Visit Diagnoses    Colon cancer screening    -  Primary   Relevant Orders   Ambulatory referral to Gastroenterology   Vitamin D deficiency       Relevant Orders   VITAMIN D 25 Hydroxy (Vit-D Deficiency, Fractures) (Completed)       Follow up plan: Return in about 4 weeks (around 06/02/2019) for RLS.

## 2019-05-05 NOTE — Telephone Encounter (Signed)
Gastroenterology Pre-Procedure Review  Request Date: 05/27/19 Requesting Physician: Dr. Vicente Males  PATIENT REVIEW QUESTIONS: The patient responded to the following health history questions as indicated:    1. Are you having any GI issues? no 2. Do you have a personal history of Polyps? yes (previous colonoscopies she said she had polyps year unknown) 3. Do you have a family history of Colon Cancer or Polyps? yes (dad colon cancer) 4. Diabetes Mellitus? no 5. Joint replacements in the past 12 months?yes (hip replacement 2019) 6. Major health problems in the past 3 months?no 7. Any artificial heart valves, MVP, or defibrillator?no    MEDICATIONS & ALLERGIES:    Patient reports the following regarding taking any anticoagulation/antiplatelet therapy:   Plavix, Coumadin, Eliquis, Xarelto, Lovenox, Pradaxa, Brilinta, or Effient? no Aspirin? no  Patient confirms/reports the following medications:  Current Outpatient Medications  Medication Sig Dispense Refill  . acetaminophen (TYLENOL) 500 MG tablet Take 1,000 mg by mouth 2 (two) times daily as needed for moderate pain or headache.     . albuterol (PROVENTIL HFA;VENTOLIN HFA) 108 (90 Base) MCG/ACT inhaler Inhale 2 puffs into the lungs every 6 (six) hours as needed for wheezing or shortness of breath. 3 Inhaler 3  . atorvastatin (LIPITOR) 40 MG tablet TAKE 1 TABLET BY MOUTH AT BEDTIME. 90 tablet 2  . budesonide-formoterol (SYMBICORT) 160-4.5 MCG/ACT inhaler Inhale 2 puffs into the lungs 2 (two) times daily as needed (shortness of breath). 1 Inhaler 6  . buPROPion (WELLBUTRIN XL) 150 MG 24 hr tablet Take 1 tablet (150 mg total) by mouth daily. 90 tablet 1  . cromolyn (OPTICROM) 4 % ophthalmic solution Place 1 drop into both eyes 4 (four) times daily. 10 mL 12  . fluticasone (FLONASE) 50 MCG/ACT nasal spray Place 2 sprays into both nostrils daily. 16 g 6  . loratadine (CLARITIN) 10 MG tablet Take 10 mg by mouth daily as needed for allergies.    .  magnesium hydroxide (MILK OF MAGNESIA) 400 MG/5ML suspension Take 30 mLs by mouth daily as needed for mild constipation. 360 mL 0  . montelukast (SINGULAIR) 10 MG tablet Take 1 tablet (10 mg total) by mouth at bedtime. 90 tablet 3  . NARCAN 4 MG/0.1ML LIQD nasal spray kit Place 1 spray into the nose once.   0  . omeprazole (PRILOSEC) 20 MG capsule Take 1 capsule (20 mg total) by mouth daily. 90 capsule 1  . ondansetron (ZOFRAN-ODT) 4 MG disintegrating tablet Take 1 tablet (4 mg total) by mouth every 8 (eight) hours as needed for nausea or vomiting. 20 tablet 6  . Oxycodone HCl 20 MG TABS Take 20 mg by mouth every 4 (four) hours as needed (pain).    Marland Kitchen rOPINIRole (REQUIP) 0.5 MG tablet Take 1 tablet (0.5 mg total) by mouth at bedtime as needed. 30 tablet 0  . valACYclovir (VALTREX) 500 MG tablet Take 4 tablets (2,000 mg total) by mouth 2 (two) times daily as needed. (Patient taking differently: Take 2,000 mg by mouth 2 (two) times daily as needed (fever blisters). ) 720 tablet 1  . Vitamin D, Ergocalciferol, (DRISDOL) 50000 units CAPS capsule Take 1 capsule (50,000 Units total) by mouth every 7 (seven) days. 12 capsule 3  . vortioxetine HBr (TRINTELLIX) 20 MG TABS tablet Take 1 tablet (20 mg total) by mouth daily. 90 tablet 1   No current facility-administered medications for this visit.    Patient confirms/reports the following allergies:  Allergies  Allergen Reactions  . Fluticasone-Salmeterol Swelling and  Other (See Comments)    Tongue sores  . Lactose Intolerance (Gi) Other (See Comments)    Bloating, GI upset, diarrhea  . Tetracyclines & Related Other (See Comments)    Went in to the sun and broke out    No orders of the defined types were placed in this encounter.   AUTHORIZATION INFORMATION Primary Insurance: 1D#: Group #:  Secondary Insurance: 1D#: Group #:  SCHEDULE INFORMATION: Date: 05/27/19 Time: Location:[facility]

## 2019-05-06 LAB — COMPREHENSIVE METABOLIC PANEL
ALT: 38 IU/L — ABNORMAL HIGH (ref 0–32)
AST: 37 IU/L (ref 0–40)
Albumin/Globulin Ratio: 1.8 (ref 1.2–2.2)
Albumin: 3.8 g/dL (ref 3.8–4.8)
Alkaline Phosphatase: 94 IU/L (ref 39–117)
BUN/Creatinine Ratio: 9 — ABNORMAL LOW (ref 12–28)
BUN: 10 mg/dL (ref 8–27)
Bilirubin Total: 0.3 mg/dL (ref 0.0–1.2)
CO2: 23 mmol/L (ref 20–29)
Calcium: 9.2 mg/dL (ref 8.7–10.3)
Chloride: 104 mmol/L (ref 96–106)
Creatinine, Ser: 1.06 mg/dL — ABNORMAL HIGH (ref 0.57–1.00)
GFR calc Af Amer: 63 mL/min/{1.73_m2} (ref >59)
GFR calc non Af Amer: 55 mL/min/{1.73_m2} — ABNORMAL LOW (ref >59)
Globulin, Total: 2.1 g/dL (ref 1.5–4.5)
Glucose: 116 mg/dL — ABNORMAL HIGH (ref 65–99)
Potassium: 4.6 mmol/L (ref 3.5–5.2)
Sodium: 141 mmol/L (ref 134–144)
Total Protein: 5.9 g/dL — ABNORMAL LOW (ref 6.0–8.5)

## 2019-05-06 LAB — LIPID PANEL W/O CHOL/HDL RATIO
Cholesterol, Total: 159 mg/dL (ref 100–199)
HDL: 67 mg/dL (ref >39)
LDL Chol Calc (NIH): 75 mg/dL (ref 0–99)
Triglycerides: 95 mg/dL (ref 0–149)
VLDL Cholesterol Cal: 17 mg/dL (ref 5–40)

## 2019-05-06 LAB — VITAMIN D 25 HYDROXY (VIT D DEFICIENCY, FRACTURES): Vit D, 25-Hydroxy: 35.2 ng/mL (ref 30.0–100.0)

## 2019-05-07 ENCOUNTER — Other Ambulatory Visit: Payer: Self-pay | Admitting: Family Medicine

## 2019-05-07 DIAGNOSIS — R7309 Other abnormal glucose: Secondary | ICD-10-CM

## 2019-05-07 NOTE — Progress Notes (Signed)
Hg a

## 2019-05-17 ENCOUNTER — Telehealth: Payer: Self-pay

## 2019-05-17 NOTE — Telephone Encounter (Signed)
Patient has requested to cancel her colonoscopy 05/27/19 with Dr. Vicente Males.  She is canceling because her daughter has cancer and she needs to be there with her during this time.  Thanks,  [place], [place]

## 2019-05-18 DIAGNOSIS — M259 Joint disorder, unspecified: Secondary | ICD-10-CM | POA: Diagnosis not present

## 2019-05-18 DIAGNOSIS — M5136 Other intervertebral disc degeneration, lumbar region: Secondary | ICD-10-CM | POA: Diagnosis not present

## 2019-05-18 DIAGNOSIS — M545 Low back pain: Secondary | ICD-10-CM | POA: Diagnosis not present

## 2019-05-18 DIAGNOSIS — G894 Chronic pain syndrome: Secondary | ICD-10-CM | POA: Diagnosis not present

## 2019-05-23 DIAGNOSIS — G2581 Restless legs syndrome: Secondary | ICD-10-CM | POA: Insufficient documentation

## 2019-05-23 NOTE — Assessment & Plan Note (Signed)
Stable, under good control. Continue current regimen 

## 2019-05-23 NOTE — Assessment & Plan Note (Signed)
Recheck lipids, adjust as needed. Continue current regimen 

## 2019-05-23 NOTE — Assessment & Plan Note (Signed)
Stable, continue regimen. 

## 2019-05-23 NOTE — Assessment & Plan Note (Signed)
Will trial requip, discussed supportive care

## 2019-05-23 NOTE — Assessment & Plan Note (Signed)
Stable and under good control, continue current regimen 

## 2019-05-23 NOTE — Assessment & Plan Note (Signed)
BPs stable and under good control, euvolemic today. Continue current regimen

## 2019-05-23 NOTE — Assessment & Plan Note (Signed)
Recheck lipids, continue current regimen 

## 2019-05-23 NOTE — Assessment & Plan Note (Signed)
Stable and well controlled, continue current regimen 

## 2019-05-23 NOTE — Assessment & Plan Note (Signed)
Euvolemic appearing today, continue current regimen

## 2019-05-23 NOTE — Assessment & Plan Note (Signed)
Stable and under good control, continue current regimen and recheck labs

## 2019-05-27 ENCOUNTER — Encounter: Admission: RE | Payer: Self-pay | Source: Home / Self Care

## 2019-05-27 ENCOUNTER — Ambulatory Visit: Admission: RE | Admit: 2019-05-27 | Payer: Medicare Other | Source: Home / Self Care | Admitting: Gastroenterology

## 2019-05-27 SURGERY — COLONOSCOPY WITH PROPOFOL
Anesthesia: General

## 2019-06-14 ENCOUNTER — Ambulatory Visit: Payer: Medicare Other

## 2019-06-14 DIAGNOSIS — R519 Headache, unspecified: Secondary | ICD-10-CM | POA: Diagnosis not present

## 2019-06-14 DIAGNOSIS — G5602 Carpal tunnel syndrome, left upper limb: Secondary | ICD-10-CM | POA: Diagnosis not present

## 2019-06-14 DIAGNOSIS — G5601 Carpal tunnel syndrome, right upper limb: Secondary | ICD-10-CM | POA: Diagnosis not present

## 2019-06-14 DIAGNOSIS — I739 Peripheral vascular disease, unspecified: Secondary | ICD-10-CM | POA: Diagnosis not present

## 2019-06-14 DIAGNOSIS — G894 Chronic pain syndrome: Secondary | ICD-10-CM | POA: Diagnosis not present

## 2019-06-14 DIAGNOSIS — M5136 Other intervertebral disc degeneration, lumbar region: Secondary | ICD-10-CM | POA: Diagnosis not present

## 2019-06-14 DIAGNOSIS — M259 Joint disorder, unspecified: Secondary | ICD-10-CM | POA: Diagnosis not present

## 2019-06-14 DIAGNOSIS — M545 Low back pain: Secondary | ICD-10-CM | POA: Diagnosis not present

## 2019-06-14 DIAGNOSIS — M47897 Other spondylosis, lumbosacral region: Secondary | ICD-10-CM | POA: Diagnosis not present

## 2019-06-23 ENCOUNTER — Ambulatory Visit (INDEPENDENT_AMBULATORY_CARE_PROVIDER_SITE_OTHER): Payer: Medicare Other

## 2019-06-23 VITALS — Ht 63.9 in | Wt 152.0 lb

## 2019-06-23 DIAGNOSIS — Z1231 Encounter for screening mammogram for malignant neoplasm of breast: Secondary | ICD-10-CM

## 2019-06-23 DIAGNOSIS — Z0000 Encounter for general adult medical examination without abnormal findings: Secondary | ICD-10-CM | POA: Diagnosis not present

## 2019-06-23 NOTE — Patient Instructions (Signed)
Amy Rocha , Thank you for taking time to come for your Medicare Wellness Visit. I appreciate your ongoing commitment to your health goals. Please review the following plan we discussed and let me know if I can assist you in the future.   Screening recommendations/referrals: Colonoscopy:schedule  Mammogram: Please call [phone] to schedule your mammogram.  Bone Density: up to date  Recommended yearly ophthalmology/optometry visit for glaucoma screening and checkup Recommended yearly dental visit for hygiene and checkup  Vaccinations: Influenza vaccine: up to date  Pneumococcal vaccine: up to date Tdap vaccine: up to date  Shingles vaccine: up to date    Covid-19: completed   Advanced directives: Advance directive discussed with you today.  Conditions/risks identified: none   Next appointment: Follow up one year for your annual wellness visit    Preventive Care 65 Years and Older, Female Preventive care refers to lifestyle choices and visits with your health care provider that can promote health and wellness. What does preventive care include?  A yearly physical exam. This is also called an annual well check.  Dental exams once or twice a year.  Routine eye exams. Ask your health care provider how often you should have your eyes checked.  Personal lifestyle choices, including:  Daily care of your teeth and gums.  Regular physical activity.  Eating a healthy diet.  Avoiding tobacco and drug use.  Limiting alcohol use.  Practicing safe sex.  Taking low-dose aspirin every day.  Taking vitamin and mineral supplements as recommended by your health care provider. What happens during an annual well check? The services and screenings done by your health care provider during your annual well check will depend on your age, overall health, lifestyle risk factors, and family history of disease. Counseling  Your health care provider may ask you questions about  your:  Alcohol use.  Tobacco use.  Drug use.  Emotional well-being.  Home and relationship well-being.  Sexual activity.  Eating habits.  History of falls.  Memory and ability to understand (cognition).  Work and work Statistician.  Reproductive health. Screening  You may have the following tests or measurements:  Height, weight, and BMI.  Blood pressure.  Lipid and cholesterol levels. These may be checked every 5 years, or more frequently if you are over 30 years old.  Skin check.  Lung cancer screening. You may have this screening every year starting at age 68 if you have a 30-pack-year history of smoking and currently smoke or have quit within the past 15 years.  Fecal occult blood test (FOBT) of the stool. You may have this test every year starting at age 51.  Flexible sigmoidoscopy or colonoscopy. You may have a sigmoidoscopy every 5 years or a colonoscopy every 10 years starting at age 31.  Hepatitis C blood test.  Hepatitis B blood test.  Sexually transmitted disease (STD) testing.  Diabetes screening. This is done by checking your blood sugar (glucose) after you have not eaten for a while (fasting). You may have this done every 1-3 years.  Bone density scan. This is done to screen for osteoporosis. You may have this done starting at age 36.  Mammogram. This may be done every 1-2 years. Talk to your health care provider about how often you should have regular mammograms. Talk with your health care provider about your test results, treatment options, and if necessary, the need for more tests. Vaccines  Your health care provider may recommend certain vaccines, such as:  Influenza vaccine. This  is recommended every year.  Tetanus, diphtheria, and acellular pertussis (Tdap, Td) vaccine. You may need a Td booster every 10 years.  Zoster vaccine. You may need this after age 79.  Pneumococcal 13-valent conjugate (PCV13) vaccine. One dose is recommended  after age 27.  Pneumococcal polysaccharide (PPSV23) vaccine. One dose is recommended after age 67. Talk to your health care provider about which screenings and vaccines you need and how often you need them. This information is not intended to replace advice given to you by your health care provider. Make sure you discuss any questions you have with your health care provider. Document Released: 03/03/2015 Document Revised: 10/25/2015 Document Reviewed: 12/06/2014 Elsevier Interactive Patient Education  2017 [place] Prevention in the Home Falls can cause injuries. They can happen to people of all ages. There are many things you can do to make your home safe and to help prevent falls. What can I do on the outside of my home?  Regularly fix the edges of walkways and driveways and fix any cracks.  Remove anything that might make you trip as you walk through a door, such as a raised step or threshold.  Trim any bushes or trees on the path to your home.  Use bright outdoor lighting.  Clear any walking paths of anything that might make someone trip, such as rocks or tools.  Regularly check to see if handrails are loose or broken. Make sure that both sides of any steps have handrails.  Any raised decks and porches should have guardrails on the edges.  Have any leaves, snow, or ice cleared regularly.  Use sand or salt on walking paths during winter.  Clean up any spills in your garage right away. This includes oil or grease spills. What can I do in the bathroom?  Use night lights.  Install grab bars by the toilet and in the tub and shower. Do not use towel bars as grab bars.  Use non-skid mats or decals in the tub or shower.  If you need to sit down in the shower, use a plastic, non-slip stool.  Keep the floor dry. Clean up any water that spills on the floor as soon as it happens.  Remove soap buildup in the tub or shower regularly.  Attach bath mats securely with  double-sided non-slip rug tape.  Do not have throw rugs and other things on the floor that can make you trip. What can I do in the bedroom?  Use night lights.  Make sure that you have a light by your bed that is easy to reach.  Do not use any sheets or blankets that are too big for your bed. They should not hang down onto the floor.  Have a firm chair that has side arms. You can use this for support while you get dressed.  Do not have throw rugs and other things on the floor that can make you trip. What can I do in the kitchen?  Clean up any spills right away.  Avoid walking on wet floors.  Keep items that you use a lot in easy-to-reach places.  If you need to reach something above you, use a strong step stool that has a grab bar.  Keep electrical cords out of the way.  Do not use floor polish or wax that makes floors slippery. If you must use wax, use non-skid floor wax.  Do not have throw rugs and other things on the floor that can make you  trip. What can I do with my stairs?  Do not leave any items on the stairs.  Make sure that there are handrails on both sides of the stairs and use them. Fix handrails that are broken or loose. Make sure that handrails are as long as the stairways.  Check any carpeting to make sure that it is firmly attached to the stairs. Fix any carpet that is loose or worn.  Avoid having throw rugs at the top or bottom of the stairs. If you do have throw rugs, attach them to the floor with carpet tape.  Make sure that you have a light switch at the top of the stairs and the bottom of the stairs. If you do not have them, ask someone to add them for you. What else can I do to help prevent falls?  Wear shoes that:  Do not have high heels.  Have rubber bottoms.  Are comfortable and fit you well.  Are closed at the toe. Do not wear sandals.  If you use a stepladder:  Make sure that it is fully opened. Do not climb a closed stepladder.  Make  sure that both sides of the stepladder are locked into place.  Ask someone to hold it for you, if possible.  Clearly mark and make sure that you can see:  Any grab bars or handrails.  First and last steps.  Where the edge of each step is.  Use tools that help you move around (mobility aids) if they are needed. These include:  Canes.  Walkers.  Scooters.  Crutches.  Turn on the lights when you go into a dark area. Replace any light bulbs as soon as they burn out.  Set up your furniture so you have a clear path. Avoid moving your furniture around.  If any of your floors are uneven, fix them.  If there are any pets around you, be aware of where they are.  Review your medicines with your doctor. Some medicines can make you feel dizzy. This can increase your chance of falling. Ask your doctor what other things that you can do to help prevent falls. This information is not intended to replace advice given to you by your health care provider. Make sure you discuss any questions you have with your health care provider. Document Released: 12/01/2008 Document Revised: 07/13/2015 Document Reviewed: 03/11/2014 Elsevier Interactive Patient Education  2017 [employer].

## 2019-06-23 NOTE — Progress Notes (Signed)
Subjective:   Amy Rocha is a 64 y.o. female who presents for Medicare Annual (Subsequent) preventive examination.  This visit is being conducted via phone call  - after an attmept to do on video chat - due to the COVID-19 pandemic. This patient has given me verbal consent via phone to conduct this visit, patient states they are participating from their home address. Some vital signs may be absent or patient reported.   Patient identification: identified by name, DOB, and current address.    Review of Systems:   Cardiac Risk Factors include: advanced age (>8mn, >>72women)     Objective:     Vitals: Ht 5' 3.9" (1.623 m)   Wt 152 lb (68.9 kg)   LMP 09/13/1975 (Approximate)   BMI 26.17 kg/m   Body mass index is 26.17 kg/m.  Advanced Directives 04/02/2018 10/14/2017 10/07/2017 01/23/2017 01/21/2017 01/03/2017 11/27/2016  Does Patient Have a Medical Advance Directive? No No No No No No No  Does patient want to make changes to medical advance directive? Yes (MAU/Ambulatory/Procedural Areas - Information given) - - - - - -  Would patient like information on creating a medical advance directive? - No - Patient declined No - Patient declined - Yes (Inpatient - patient requests chaplain consult to create a medical advance directive) No - Patient declined No - Patient declined    Tobacco Social History   Tobacco Use  Smoking Status Former Smoker  . Quit date: 02/19/1996  . Years since quitting: 23.3  Smokeless Tobacco Never Used     Counseling given: Not Answered   Clinical Intake:  Pre-visit preparation completed: Yes  Pain : No/denies pain     Nutritional Status: BMI 25 -29 Overweight Nutritional Risks: None Diabetes: No  How often do you need to have someone help you when you read instructions, pamphlets, or other written materials from your doctor or pharmacy?: 1 - Never     Information entered by :: Kerin Cecchi,LPN  Past Medical History:  Diagnosis Date  .  Anxiety   . Arthritis   . Asthma   . Avascular necrosis ([place]   . Blood transfusion without reported diagnosis   . Calculus of kidney   . CHF (congestive heart failure) ([facility]   . Chronic pain syndrome   . Complication of anesthesia    aspirated in recovery after  hip replacement 1998  . COPD (chronic obstructive pulmonary disease) ([place]   . Depression   . GERD (gastroesophageal reflux disease)   . Heart failure ([facility]   . Hyperlipidemia   . Hypertension   . IBS (irritable bowel syndrome)   . Joint pain   . Osteoporosis   . Oxygen deficiency   . Peripheral vascular disease ([place]   . PONV (postoperative nausea and vomiting)   . Urge incontinence    Past Surgical History:  Procedure Laterality Date  . ABDOMINAL HYSTERECTOMY    . avascular necrosis    . CARDIAC CATHETERIZATION Left 12/01/2015   Procedure: Left Heart Cath and Coronary Angiography;  Surgeon: KTeodoro Spray MD;  [facility];  Service: Cardiovascular;  Laterality: Left;  . eyelid lift    . HERNIA REPAIR    . INTRAMEDULLARY (IM) NAIL INTERTROCHANTERIC Left 10/14/2017   Procedure: REPAIR OF LEFT GREATER TROCHANTER NONUNION;  Surgeon: MHessie Knows MD;  Location: [facility];  Service: Orthopedics;  Laterality: Left;  . JOINT REPLACEMENT     hip  . NASAL SINUS SURGERY    .  TOTAL HIP REVISION Left 01/21/2017   Procedure: TOTAL HIP REVISION FEMORAL STEM WITH POLYETHYLENE EXCHANGE WITH CUPLINER;  Surgeon: Hessie Knows, MD;  Location: [facility];  Service: Orthopedics;  Laterality: Left;   Family History  Problem Relation Age of Onset  . Dementia Mother   . Cancer Father        colon  . Arthritis Brother   . Hypertension Daughter   . Aneurysm Brother    Social History   Socioeconomic History  . Marital status: Married    Spouse name: Not on file  . Number of children: Not on file  . Years of education: Not on file  . Highest education level: 12th grade  Occupational History  . Occupation:  disabled   Tobacco Use  . Smoking status: Former Smoker    Quit date: 02/19/1996    Years since quitting: 23.3  . Smokeless tobacco: Never Used  Substance and Sexual Activity  . Alcohol use: No    Alcohol/week: 0.0 standard drinks  . Drug use: No  . Sexual activity: Yes  Other Topics Concern  . Not on file  Social History Narrative  . Not on file   Social Determinants of Health   Financial Resource Strain:   . Difficulty of Paying Living Expenses:   Food Insecurity:   . Worried About Charity fundraiser in the Last Year:   . Arboriculturist in the Last Year:   Transportation Needs:   . Film/video editor (Medical):   Marland Kitchen Lack of Transportation (Non-Medical):   Physical Activity:   . Days of Exercise per Week:   . Minutes of Exercise per Session:   Stress:   . Feeling of Stress :   Social Connections:   . Frequency of Communication with Friends and Family:   . Frequency of Social Gatherings with Friends and Family:   . Attends Religious Services:   . Active Member of Clubs or Organizations:   . Attends Archivist Meetings:   Marland Kitchen Marital Status:     Outpatient Encounter Medications as of 06/23/2019  Medication Sig  . acetaminophen (TYLENOL) 500 MG tablet Take 1,000 mg by mouth 2 (two) times daily as needed for moderate pain or headache.   . albuterol (PROVENTIL HFA;VENTOLIN HFA) 108 (90 Base) MCG/ACT inhaler Inhale 2 puffs into the lungs every 6 (six) hours as needed for wheezing or shortness of breath.  Marland Kitchen amoxicillin (AMOXIL) 500 MG tablet Take 1,000 mg by mouth 2 (two) times daily.  Marland Kitchen atorvastatin (LIPITOR) 40 MG tablet TAKE 1 TABLET BY MOUTH AT BEDTIME.  . budesonide-formoterol (SYMBICORT) 160-4.5 MCG/ACT inhaler Inhale 2 puffs into the lungs 2 (two) times daily as needed (shortness of breath).  Marland Kitchen buPROPion (WELLBUTRIN XL) 150 MG 24 hr tablet Take 1 tablet (150 mg total) by mouth daily.  . cromolyn (OPTICROM) 4 % ophthalmic solution Place 1 drop into both eyes 4  (four) times daily. (Patient taking differently: Place 1 drop into both eyes 4 (four) times daily. As needed)  . fluticasone (FLONASE) 50 MCG/ACT nasal spray Place 2 sprays into both nostrils daily.  Marland Kitchen loratadine (CLARITIN) 10 MG tablet Take 10 mg by mouth daily as needed for allergies.  . magnesium hydroxide (MILK OF MAGNESIA) 400 MG/5ML suspension Take 30 mLs by mouth daily as needed for mild constipation.  . montelukast (SINGULAIR) 10 MG tablet Take 1 tablet (10 mg total) by mouth at bedtime.  Marland Kitchen omeprazole (PRILOSEC) 20 MG capsule Take 1 capsule (  20 mg total) by mouth daily.  . ondansetron (ZOFRAN-ODT) 4 MG disintegrating tablet Take 1 tablet (4 mg total) by mouth every 8 (eight) hours as needed for nausea or vomiting.  . Oxycodone HCl 20 MG TABS Take 20 mg by mouth every 4 (four) hours as needed (pain).  Marland Kitchen rOPINIRole (REQUIP) 0.5 MG tablet Take 1 tablet (0.5 mg total) by mouth at bedtime as needed.  . valACYclovir (VALTREX) 500 MG tablet Take 4 tablets (2,000 mg total) by mouth 2 (two) times daily as needed. (Patient taking differently: Take 2,000 mg by mouth 2 (two) times daily as needed (fever blisters). )  . VITAMIN D PO Take 2,000 mg by mouth.  . vortioxetine HBr (TRINTELLIX) 20 MG TABS tablet Take 1 tablet (20 mg total) by mouth daily.  Marland Kitchen NARCAN 4 MG/0.1ML LIQD nasal spray kit Place 1 spray into the nose once.   . [DISCONTINUED] Vitamin D, Ergocalciferol, (DRISDOL) 50000 units CAPS capsule Take 1 capsule (50,000 Units total) by mouth every 7 (seven) days. (Patient not taking: Reported on 06/23/2019)   No facility-administered encounter medications on file as of 06/23/2019.    Activities of Daily Living In your present state of health, do you have any difficulty performing the following activities: 06/23/2019 09/24/2018  Hearing? N N  Comment no hearing aids -  Vision? N N  Comment reading glasses, [facility] -  Difficulty concentrating or making decisions? N N  Walking or climbing  stairs? N Y  Dressing or bathing? N N  Doing errands, shopping? N N  Preparing Food and eating ? N -  Using the Toilet? N -  In the past six months, have you accidently leaked urine? Y -  Comment pads -  Do you have problems with loss of bowel control? N -  Managing your Medications? N -  Managing your Finances? N -  Housekeeping or managing your Housekeeping? N -  Some recent data might be hidden    Patient Care Team: Volney American, PA-C as PCP - General (Family Medicine) De Hollingshead, [facility] as Pharmacist    Assessment:   This is a routine wellness examination for Emmalynne.  Exercise Activities and Dietary recommendations Current Exercise Habits: The patient does not participate in regular exercise at present, Exercise limited by: None identified  Goals Addressed   None     Fall Risk: Fall Risk  06/23/2019 05/05/2019 09/24/2018 04/02/2018 09/25/2016  Falls in the past year? 0 0 0 0 No  Comment - - - - -  Number falls in past yr: 0 0 0 0 -  Injury with Fall? 0 0 0 0 -  Comment - - - - -  Risk Factor Category  - - - - -  Risk for fall due to : - - - - -  Follow up - - - - -    [place]:  Any stairs in or around the home? No  If so, are there any without handrails? No   Home free of loose throw rugs in walkways, pet beds, electrical cords, etc? Yes  Adequate lighting in your home to reduce risk of falls? Yes   ASSISTIVE DEVICES UTILIZED TO PREVENT FALLS:  Life alert? No  Use of a cane, walker or w/c? Yes  cane as needed Grab bars in the bathroom? Yes  Shower chair or bench in shower? No  Elevated toilet seat or a handicapped toilet? No   DME ORDERS:  DME order needed?  No   TIMED UP AND GO:  Unable to perform    Depression Screen PHQ 2/9 Scores 06/23/2019 05/05/2019 09/24/2018 04/02/2018  PHQ - 2 Score '1 1 1 3  ' PHQ- 9 Score - '2 2 9  ' Exception Documentation - - - -     Cognitive Function     6CIT Screen  04/02/2018 09/12/2016  What Year? 0 points 0 points  What month? 0 points 0 points  What time? 0 points 0 points  Count back from 20 0 points 0 points  Months in reverse 0 points 0 points  Repeat phrase 0 points 0 points  Total Score 0 0    Immunization History  Administered Date(s) Administered  . Fluad Quad(high Dose 65+) 11/25/2018  . Influenza,inj,Quad PF,6+ Mos 01/22/2017, 12/16/2017  . Influenza-Unspecified 10/29/2011, 12/30/2012, 12/02/2013, 01/30/2015, 11/22/2015  . PFIZER SARS-COV-2 Vaccination 03/12/2019, 04/01/2019  . Pneumococcal Conjugate-13 09/24/2018  . Pneumococcal Polysaccharide-23 11/10/2009, 01/22/2017  . Td 04/12/2008, 09/24/2018  . Zoster 06/28/2015  . Zoster Recombinat (Shingrix) 01/06/2018, 09/04/2018, 09/15/2018    Qualifies for Shingles Vaccine? Shingrix completed   Tdap: up to date   Flu Vaccine: up to date   Pneumococcal Vaccine: up to date   Covid-19 Vaccine:  Completed vaccines  Screening Tests Health Maintenance  Topic Date Due  . COLONOSCOPY  11/30/2018  . INFLUENZA VACCINE  09/19/2019  . MAMMOGRAM  08/19/2020  . PNA vac Low Risk Adult (2 of 2 - PPSV23) 01/22/2022  . TETANUS/TDAP  09/23/2028  . DEXA SCAN  Completed  . COVID-19 Vaccine  Completed  . Hepatitis C Screening  Completed    Cancer Screenings:  Colorectal Screening: will call and reschedule.   Mammogram: Completed 08/20/2018. Repeat every year  Bone Density: Completed 09/2012.   Lung Cancer Screening: (Low Dose CT Chest recommended if Age 11-80 years, 30 pack-year currently smoking OR have quit w/in 15years.) does not qualify.     Additional Screening:  Hepatitis C Screening: does qualify; Completed 2017  Vision Screening: Recommended annual ophthalmology exams for early detection of glaucoma and other disorders of the eye. Is the patient up to date with their annual eye exam?  Yes  Who is the provider or what is the name of the office in which the pt attends annual  eye exams? [facility]   Dental Screening: Recommended annual dental exams for proper oral hygiene  Community Resource Referral:  CRR required this visit?  No       Plan:  I have personally reviewed and addressed the Medicare Annual Wellness questionnaire and have noted the following in the patient's chart:  A. Medical and social history B. Use of alcohol, tobacco or illicit drugs  C. Current medications and supplements D. Functional ability and status E.  Nutritional status F.  Physical activity G. Advance directives H. List of other physicians I.  Hospitalizations, surgeries, and ER visits in previous 12 months J.  [place] such as hearing and vision if needed, cognitive and depression L. Referrals and appointments   In addition, I have reviewed and discussed with patient certain preventive protocols, quality metrics, and best practice recommendations. A written personalized care plan for preventive services as well as general preventive health recommendations were provided to patient.  Signed,    Bevelyn Ngo, LPN  03/22/1939 Nurse Health Advisor   Nurse Notes: patient states she has a bad rash on her face where her mask is. States she started taking her amoxicillin  she had at home. Advised her to stop taking the abx until seen by provider. Scheduled appt with [facility] for tomorrow 06/24/2019. Patient requested virtual appt.

## 2019-06-24 ENCOUNTER — Telehealth (INDEPENDENT_AMBULATORY_CARE_PROVIDER_SITE_OTHER): Payer: Medicare Other | Admitting: Nurse Practitioner

## 2019-06-24 ENCOUNTER — Other Ambulatory Visit: Payer: Self-pay

## 2019-06-24 ENCOUNTER — Encounter: Payer: Self-pay | Admitting: Nurse Practitioner

## 2019-06-24 DIAGNOSIS — L309 Dermatitis, unspecified: Secondary | ICD-10-CM | POA: Diagnosis not present

## 2019-06-24 MED ORDER — CLOBETASOL PROPIONATE 0.05 % EX CREA: 1.0000 "application " | TOPICAL_CREAM | Freq: Two times a day (BID) | CUTANEOUS | 0 refills | Status: DC

## 2019-06-24 MED ORDER — PREDNISONE 20 MG PO TABS
40.0000 mg | ORAL_TABLET | Freq: Every day | ORAL | 0 refills | Status: AC
Start: 2019-06-24 — End: 2019-06-29

## 2019-06-24 NOTE — Assessment & Plan Note (Signed)
Acute, ? Eczema from new product used or mask.  She has used Prednisone in past and used steroid cream without issue.  Will send in short period of Prednisone and Clobetasol cream, scripts sent in.  Recommend avoiding harsh skin products or anything with fragrance until area improved.  Return to office in one week for visit with PCP to have face to face exam, sooner if any worsening presents.

## 2019-06-24 NOTE — Progress Notes (Signed)
Ht 5\' 4"  (1.626 m)   Wt 145 lb (65.8 kg)   LMP 09/13/1975 (Approximate)   BMI 24.89 kg/m    Subjective:    Patient ID: Amy Rocha, female    DOB: Jul 17, 1955, 64 y.o.   MRN: [id number]  HPI: Amy Rocha is a 64 y.o. female  Chief Complaint  Patient presents with  . Rash    Patient states she has sores on her face. Ongoing appx 2 weeks. Has tried neosporin with no relief. States she doesn't know whether it's from her mask or new moisturrizer.     . This visit was completed via MyChart due to the restrictions of the COVID-19 pandemic. All issues as above were discussed and addressed. Physical exam was done as above through visual confirmation on MyChart. If it was felt that the patient should be evaluated in the office, they were directed there. The patient verbally consented to this visit. . Location of the patient: home . Location of the provider: home . Those involved with this call:  . Provider: Marnee Guarneri, DNP . CMA: Merilyn Baba, CMA . Front Desk/Registration: Don Perking  . Time spent on call: 15 minutes with patient face to face via video conference. More than 50% of this time was spent in counseling and coordination of care. 10 minutes total spent in review of patient's record and preparation of their chart.  . I verified patient identity using two factors (patient name and date of birth). Patient consents verbally to being seen via telemedicine visit today.    RASH Face is "broke out", this started about 1 1/2 weeks.  Had not been doing any gardening or anything in yard.  Rash to left side of face, has had no blisters to area.  Reports it is like a crusty, scab like.  There are three spots the size of end of pencil.  Reports it is not shingles, she knows it is not that. States she has taken Prednisone before and it fixes everything, on review she has taken this before, last in 2019. Duration:  weeks  Location: face  Itching: yes Burning: no Redness:  yes Oozing: no Scaling: yes Blisters: no Painful: no Fevers: no Change in detergents/soaps/personal care products: started using new moisturizer from [employer] -- stopped using and it is still there Recent illness: no Recent travel:no History of same: no Context: stable Alleviating factors: nothing Treatments attempted:lotion/moisturizer Shortness of breath: no  Throat/tongue swelling: no Myalgias/arthralgias: no  Relevant past medical, surgical, family and social history reviewed and updated as indicated. Interim medical history since our last visit reviewed. Allergies and medications reviewed and updated.  Review of Systems  Constitutional: Negative for activity change, appetite change, diaphoresis, fatigue and fever.  Respiratory: Negative for cough, chest tightness and shortness of breath.   Cardiovascular: Negative for chest pain, palpitations and leg swelling.  Skin: Positive for rash.  Psychiatric/Behavioral: Negative.     Per HPI unless specifically indicated above     Objective:    Ht 5\' 4"  (1.626 m)   Wt 145 lb (65.8 kg)   LMP 09/13/1975 (Approximate)   BMI 24.89 kg/m   Wt Readings from Last 3 Encounters:  06/24/19 145 lb (65.8 kg)  06/23/19 152 lb (68.9 kg)  05/05/19 152 lb (68.9 kg)    Physical Exam Vitals and nursing note reviewed.  Constitutional:      General: She is awake. She is not in acute distress.    Appearance: She is well-developed. She is  not ill-appearing.  HENT:     Head: Normocephalic.     Right Ear: Hearing normal.     Left Ear: Hearing normal.  Eyes:     General: Lids are normal.        Right eye: No discharge.        Left eye: No discharge.     Conjunctiva/sclera: Conjunctivae normal.  Pulmonary:     Effort: Pulmonary effort is normal. No accessory muscle usage or respiratory distress.  Musculoskeletal:     Cervical back: Normal range of motion.  Skin:    Findings: Rash present.     Comments: Difficult to view as patient  video very blurry, able to see three small areas of round erythema with crusty, scaling to left side of face.  No blisters noted and skin appears intact.  Neurological:     Mental Status: She is alert and oriented to person, place, and time.  Psychiatric:        Attention and Perception: Attention normal.        Mood and Affect: Mood normal.        Behavior: Behavior normal. Behavior is cooperative.        Thought Content: Thought content normal.        Judgment: Judgment normal.     Results for orders placed or performed in visit on 05/05/19  Comprehensive metabolic panel  Result Value Ref Range   Glucose 116 (H) 65 - 99 mg/dL   BUN 10 8 - 27 mg/dL   Creatinine, Ser 1.06 (H) 0.57 - 1.00 mg/dL   GFR calc non Af Amer 55 (L) >59 mL/min/1.73   GFR calc Af Amer 63 >59 mL/min/1.73   BUN/Creatinine Ratio 9 (L) 12 - 28   Sodium 141 134 - 144 mmol/L   Potassium 4.6 3.5 - 5.2 mmol/L   Chloride 104 96 - 106 mmol/L   CO2 23 20 - 29 mmol/L   Calcium 9.2 8.7 - 10.3 mg/dL   Total Protein 5.9 (L) 6.0 - 8.5 g/dL   Albumin 3.8 3.8 - 4.8 g/dL   Globulin, Total 2.1 1.5 - 4.5 g/dL   Albumin/Globulin Ratio 1.8 1.2 - 2.2   Bilirubin Total 0.3 0.0 - 1.2 mg/dL   Alkaline Phosphatase 94 39 - 117 IU/L   AST 37 0 - 40 IU/L   ALT 38 (H) 0 - 32 IU/L  Lipid Panel w/o Chol/HDL Ratio  Result Value Ref Range   Cholesterol, Total 159 100 - 199 mg/dL   Triglycerides 95 0 - 149 mg/dL   HDL 67 >39 mg/dL   VLDL Cholesterol Cal 17 5 - 40 mg/dL   LDL Chol Calc (NIH) 75 0 - 99 mg/dL  VITAMIN D 25 Hydroxy (Vit-D Deficiency, Fractures)  Result Value Ref Range   Vit D, 25-Hydroxy 35.2 30.0 - 100.0 ng/mL      Assessment & Plan:   Problem List Items Addressed This Visit      Musculoskeletal and Integument   Dermatitis    Acute, ? Eczema from new product used or mask.  She has used Prednisone in past and used steroid cream without issue.  Will send in short period of Prednisone and Clobetasol cream, scripts  sent in.  Recommend avoiding harsh skin products or anything with fragrance until area improved.  Return to office in one week for visit with PCP to have face to face exam, sooner if any worsening presents.          Follow  up plan: Return in about 1 week (around 07/01/2019) for Rash follow-up.

## 2019-06-24 NOTE — Patient Instructions (Signed)
Rash, Adult  A rash is a change in the color of your skin. A rash can also change the way your skin feels. There are many different conditions and factors that can cause a rash. Follow these instructions at home: The goal of treatment is to stop the itching and keep the rash from spreading. Watch for any changes in your symptoms. Let your doctor know about them. Follow these instructions to help with your condition: Medicine Take or apply over-the-counter and prescription medicines only as told by your doctor. These may include medicines:  To treat red or swollen skin (corticosteroid creams).  To treat itching.  To treat an allergy (oral antihistamines).  To treat very bad symptoms (oral corticosteroids).  Skin care  Put cool cloths (compresses) on the affected areas.  Do not scratch or rub your skin.  Avoid covering the rash. Make sure that the rash is exposed to air as much as possible. Managing itching and discomfort  Avoid hot showers or baths. These can make itching worse. A cold shower may help.  Try taking a bath with: ? Epsom salts. You can get these at your local pharmacy or grocery store. Follow the instructions on the package. ? Baking soda. Pour a small amount into the bath as told by your doctor. ? Colloidal oatmeal. You can get this at your local pharmacy or grocery store. Follow the instructions on the package.  Try putting baking soda paste onto your skin. Stir water into baking soda until it gets like a paste.  Try putting on a lotion that relieves itchiness (calamine lotion).  Keep cool and out of the sun. Sweating and being hot can make itching worse. General instructions   Rest as needed.  Drink enough fluid to keep your pee (urine) pale yellow.  Wear loose-fitting clothing.  Avoid scented soaps, detergents, and perfumes. Use gentle soaps, detergents, perfumes, and other cosmetic products.  Avoid anything that causes your rash. Keep a journal to  help track what causes your rash. Write down: ? What you eat. ? What cosmetic products you use. ? What you drink. ? What you wear. This includes jewelry.  Keep all follow-up visits as told by your doctor. This is important. Contact a doctor if:  You sweat at night.  You lose weight.  You pee (urinate) more than normal.  You pee less than normal, or you notice that your pee is a darker color than normal.  You feel weak.  You throw up (vomit).  Your skin or the whites of your eyes look yellow (jaundice).  Your skin: ? Tingles. ? Is numb.  Your rash: ? Does not go away after a few days. ? Gets worse.  You are: ? More thirsty than normal. ? More tired than normal.  You have: ? New symptoms. ? Pain in your belly (abdomen). ? A fever. ? Watery poop (diarrhea). Get help right away if:  You have a fever and your symptoms suddenly get worse.  You start to feel mixed up (confused).  You have a very bad headache or a stiff neck.  You have very bad joint pains or stiffness.  You have jerky movements that you cannot control (seizure).  Your rash covers all or most of your body. The rash may or may not be painful.  You have blisters that: ? Are on top of the rash. ? Grow larger. ? Grow together. ? Are painful. ? Are inside your nose or mouth.  You have a rash   that: ? Looks like purple pinprick-sized spots all over your body. ? Has a "bull's eye" or looks like a target. ? Is red and painful, causes your skin to peel, and is not from being in the sun too long. Summary  A rash is a change in the color of your skin. A rash can also change the way your skin feels.  The goal of treatment is to stop the itching and keep the rash from spreading.  Take or apply over-the-counter and prescription medicines only as told by your doctor.  Contact a doctor if you have new symptoms or symptoms that get worse.  Keep all follow-up visits as told by your doctor. This is  important. This information is not intended to replace advice given to you by your health care provider. Make sure you discuss any questions you have with your health care provider. Document Revised: 05/29/2018 Document Reviewed: 09/08/2017 Elsevier Patient Education  2020 [employer]  

## 2019-06-29 ENCOUNTER — Telehealth (INDEPENDENT_AMBULATORY_CARE_PROVIDER_SITE_OTHER): Payer: Medicare Other | Admitting: Family Medicine

## 2019-06-29 ENCOUNTER — Encounter: Payer: Self-pay | Admitting: Family Medicine

## 2019-06-29 DIAGNOSIS — R21 Rash and other nonspecific skin eruption: Secondary | ICD-10-CM | POA: Diagnosis not present

## 2019-06-29 NOTE — Progress Notes (Signed)
LMP 09/13/1975 (Approximate)    Subjective:    Patient ID: Amy Rocha, female    DOB: 08-Apr-1955, 64 y.o.   MRN: [id number]  HPI: Amy Rocha is a 64 y.o. female  Chief Complaint  Patient presents with  . Rash    On face, patient states that it is a lot better    . This visit was completed via MyChart due to the restrictions of the COVID-19 pandemic. All issues as above were discussed and addressed. Physical exam was done as above through visual confirmation on MyChart. If it was felt that the patient should be evaluated in the office, they were directed there. The patient verbally consented to this visit. . Location of the patient: home . Location of the provider: home . Those involved with this call:  . Provider: Merrie Roof, PA-C . CMA: Tiffany Reel, CMA . Front Desk/Registration: Jill Side  . Time spent on call: 15 minutes with patient face to face via video conference. More than 50% of this time was spent in counseling and coordination of care. 5 minutes total spent in review of patient's record and preparation of their chart. I verified patient identity using two factors (patient name and date of birth). Patient consents verbally to being seen via telemedicine visit today.   Here today for 1 week f/u facial rash after course of prednisone and clobetasol topical. Rash much improved per patient, but still a lingering dry patch on left cheek. No major itching, burning, discharge from area and not using any irritating or scented products. Denies any new places breaking out in rash.   Relevant past medical, surgical, family and social history reviewed and updated as indicated. Interim medical history since our last visit reviewed. Allergies and medications reviewed and updated.  Review of Systems  Per HPI unless specifically indicated above     Objective:    LMP 09/13/1975 (Approximate)   Wt Readings from Last 3 Encounters:  06/24/19 145 lb (65.8 kg)    06/23/19 152 lb (68.9 kg)  05/05/19 152 lb (68.9 kg)    Physical Exam Vitals and nursing note reviewed.  Constitutional:      General: She is not in acute distress.    Appearance: Normal appearance.  HENT:     Head: Atraumatic.     Right Ear: External ear normal.     Left Ear: External ear normal.     Nose: Nose normal. No congestion.     Mouth/Throat:     Mouth: Mucous membranes are moist.     Pharynx: Oropharynx is clear. No posterior oropharyngeal erythema.  Eyes:     Extraocular Movements: Extraocular movements intact.     Conjunctiva/sclera: Conjunctivae normal.  Cardiovascular:     Comments: Unable to assess via virtual visit Pulmonary:     Effort: Pulmonary effort is normal. No respiratory distress.  Musculoskeletal:        General: Normal range of motion.     Cervical back: Normal range of motion.  Skin:    General: Skin is dry.     Findings: Rash (unable to discern rash of left cheek due to video quality, overall skin appears healthy and intact) present. No erythema.  Neurological:     Mental Status: She is alert and oriented to person, place, and time.  Psychiatric:        Mood and Affect: Mood normal.        Thought Content: Thought content normal.  Judgment: Judgment normal.     Results for orders placed or performed in visit on 05/05/19  Comprehensive metabolic panel  Result Value Ref Range   Glucose 116 (H) 65 - 99 mg/dL   BUN 10 8 - 27 mg/dL   Creatinine, Ser 1.06 (H) 0.57 - 1.00 mg/dL   GFR calc non Af Amer 55 (L) >59 mL/min/1.73   GFR calc Af Amer 63 >59 mL/min/1.73   BUN/Creatinine Ratio 9 (L) 12 - 28   Sodium 141 134 - 144 mmol/L   Potassium 4.6 3.5 - 5.2 mmol/L   Chloride 104 96 - 106 mmol/L   CO2 23 20 - 29 mmol/L   Calcium 9.2 8.7 - 10.3 mg/dL   Total Protein 5.9 (L) 6.0 - 8.5 g/dL   Albumin 3.8 3.8 - 4.8 g/dL   Globulin, Total 2.1 1.5 - 4.5 g/dL   Albumin/Globulin Ratio 1.8 1.2 - 2.2   Bilirubin Total 0.3 0.0 - 1.2 mg/dL    Alkaline Phosphatase 94 39 - 117 IU/L   AST 37 0 - 40 IU/L   ALT 38 (H) 0 - 32 IU/L  Lipid Panel w/o Chol/HDL Ratio  Result Value Ref Range   Cholesterol, Total 159 100 - 199 mg/dL   Triglycerides 95 0 - 149 mg/dL   HDL 67 >39 mg/dL   VLDL Cholesterol Cal 17 5 - 40 mg/dL   LDL Chol Calc (NIH) 75 0 - 99 mg/dL  VITAMIN D 25 Hydroxy (Vit-D Deficiency, Fractures)  Result Value Ref Range   Vit D, 25-Hydroxy 35.2 30.0 - 100.0 ng/mL      Assessment & Plan:   Problem List Items Addressed This Visit    None    Visit Diagnoses    Rash    -  Primary   Likely eczema based on description, continue moisturizer and small dilute amounts of clobetasol prn. If not resolved in 1 month return in person for eval       Follow up plan: Return if symptoms worsen or fail to improve.

## 2019-07-12 DIAGNOSIS — M545 Low back pain: Secondary | ICD-10-CM | POA: Diagnosis not present

## 2019-07-12 DIAGNOSIS — G894 Chronic pain syndrome: Secondary | ICD-10-CM | POA: Diagnosis not present

## 2019-08-09 DIAGNOSIS — M545 Low back pain: Secondary | ICD-10-CM | POA: Diagnosis not present

## 2019-08-09 DIAGNOSIS — G894 Chronic pain syndrome: Secondary | ICD-10-CM | POA: Diagnosis not present

## 2019-08-09 DIAGNOSIS — M5136 Other intervertebral disc degeneration, lumbar region: Secondary | ICD-10-CM | POA: Diagnosis not present

## 2019-08-11 DIAGNOSIS — G894 Chronic pain syndrome: Secondary | ICD-10-CM | POA: Diagnosis not present

## 2019-08-13 ENCOUNTER — Other Ambulatory Visit: Payer: Self-pay

## 2019-08-13 ENCOUNTER — Encounter: Payer: Self-pay | Admitting: Family Medicine

## 2019-08-13 ENCOUNTER — Ambulatory Visit (INDEPENDENT_AMBULATORY_CARE_PROVIDER_SITE_OTHER): Payer: Medicare Other | Admitting: Family Medicine

## 2019-08-13 VITALS — BP 141/80 | HR 78 | Temp 98.2°F | Wt 151.0 lb

## 2019-08-13 DIAGNOSIS — M2170 Unequal limb length (acquired), unspecified site: Secondary | ICD-10-CM

## 2019-08-13 DIAGNOSIS — Z7689 Persons encountering health services in other specified circumstances: Secondary | ICD-10-CM | POA: Diagnosis not present

## 2019-08-13 NOTE — Progress Notes (Signed)
BP (!) 141/80    Pulse 78    Temp 98.2 F (36.8 C) (Oral)    Wt 151 lb (68.5 kg)    LMP 09/13/1975 (Approximate)    SpO2 98%    BMI 25.92 kg/m    Subjective:    Patient ID: Amy Rocha, female    DOB: 1956/02/05, 64 y.o.   MRN: [id number]  HPI: Amy Rocha is a 64 y.o. female  Chief Complaint  Patient presents with   Referral    for sleep study. pt states that her O2 drops down to 89 during asleep   Has been wearing an activity monitor on the wrist lately and notes her O2 overnight drops down to 89%. Does have known COPD that is fairly well controlled, baseline O2 saturation tends to be above 96%. Denies snoring, daytime somnolence, apneic episodes noticed. Requesting referral for sleep study.   Pain Specialist notes left leg is shorter than the other and is recommending a shoe wedge. Pt requesting order for one.   Relevant past medical, surgical, family and social history reviewed and updated as indicated. Interim medical history since our last visit reviewed. Allergies and medications reviewed and updated.  Review of Systems  Per HPI unless specifically indicated above     Objective:    BP (!) 141/80    Pulse 78    Temp 98.2 F (36.8 C) (Oral)    Wt 151 lb (68.5 kg)    LMP 09/13/1975 (Approximate)    SpO2 98%    BMI 25.92 kg/m   Wt Readings from Last 3 Encounters:  08/13/19 151 lb (68.5 kg)  06/24/19 145 lb (65.8 kg)  06/23/19 152 lb (68.9 kg)    Physical Exam Vitals and nursing note reviewed.  Constitutional:      Appearance: Normal appearance. She is not ill-appearing.  HENT:     Head: Atraumatic.  Eyes:     Extraocular Movements: Extraocular movements intact.     Conjunctiva/sclera: Conjunctivae normal.  Cardiovascular:     Rate and Rhythm: Normal rate and regular rhythm.     Heart sounds: Normal heart sounds.  Pulmonary:     Effort: Pulmonary effort is normal.     Breath sounds: Normal breath sounds.  Musculoskeletal:        General: Normal  range of motion.     Cervical back: Normal range of motion and neck supple.  Skin:    General: Skin is warm and dry.  Neurological:     Mental Status: She is alert and oriented to person, place, and time.  Psychiatric:        Mood and Affect: Mood normal.        Thought Content: Thought content normal.        Judgment: Judgment normal.     Results for orders placed or performed in visit on 05/05/19  Comprehensive metabolic panel  Result Value Ref Range   Glucose 116 (H) 65 - 99 mg/dL   BUN 10 8 - 27 mg/dL   Creatinine, Ser 1.06 (H) 0.57 - 1.00 mg/dL   GFR calc non Af Amer 55 (L) >59 mL/min/1.73   GFR calc Af Amer 63 >59 mL/min/1.73   BUN/Creatinine Ratio 9 (L) 12 - 28   Sodium 141 134 - 144 mmol/L   Potassium 4.6 3.5 - 5.2 mmol/L   Chloride 104 96 - 106 mmol/L   CO2 23 20 - 29 mmol/L   Calcium 9.2 8.7 - 10.3 mg/dL  Total Protein 5.9 (L) 6.0 - 8.5 g/dL   Albumin 3.8 3.8 - 4.8 g/dL   Globulin, Total 2.1 1.5 - 4.5 g/dL   Albumin/Globulin Ratio 1.8 1.2 - 2.2   Bilirubin Total 0.3 0.0 - 1.2 mg/dL   Alkaline Phosphatase 94 39 - 117 IU/L   AST 37 0 - 40 IU/L   ALT 38 (H) 0 - 32 IU/L  Lipid Panel w/o Chol/HDL Ratio  Result Value Ref Range   Cholesterol, Total 159 100 - 199 mg/dL   Triglycerides 95 0 - 149 mg/dL   HDL 67 >39 mg/dL   VLDL Cholesterol Cal 17 5 - 40 mg/dL   LDL Chol Calc (NIH) 75 0 - 99 mg/dL  VITAMIN D 25 Hydroxy (Vit-D Deficiency, Fractures)  Result Value Ref Range   Vit D, 25-Hydroxy 35.2 30.0 - 100.0 ng/mL      Assessment & Plan:   Problem List Items Addressed This Visit    None    Visit Diagnoses    Sleep concern    -  Primary   Asymptomatic, but will place order for sleep eval at patient's request   Relevant Orders   Ambulatory referral to Sleep Studies   Leg length discrepancy       Order written for heel lift, discussed with pt to let us know if having adjustment issues with usage so we can place PT referral if needed       Follow up  plan: Return in about 3 months (around 11/13/2019) for 6 month f/u.

## 2019-08-16 ENCOUNTER — Telehealth: Payer: Self-pay | Admitting: Family Medicine

## 2019-08-16 NOTE — Telephone Encounter (Signed)
Copied from [place] [id number]. Topic: Medical Record Request - Provider/Facility Request >> Aug 16, 2019 12:37 PM Erick Blinks wrote: Maudie Mercury called from [facility], she states she needs the most recent in office appointment notes faxed to her office. Please advise Fax number [phone] [facility]  Address: [address].

## 2019-08-16 NOTE — Telephone Encounter (Signed)
Office visit note printed and faxed per request.

## 2019-08-18 ENCOUNTER — Telehealth: Payer: Self-pay | Admitting: Family Medicine

## 2019-08-18 MED ORDER — ZOLPIDEM TARTRATE 5 MG PO TABS
5.0000 mg | ORAL_TABLET | Freq: Once | ORAL | 0 refills | Status: DC
Start: 2019-08-18 — End: 2019-12-21

## 2019-08-18 NOTE — Telephone Encounter (Signed)
Spoke with Pt. Pt states she has tried these and they do not help. She is requesting 1 pill to help for the night of her sleep study. I advised she would need an appt, pt stated if she could not get a prescription she will probably cancel her sleep study as she feels she will not be able to sleep and it will be pointless. I expressed understanding, however in order to get a Rx she would need to be seen by Apolonio Schneiders. Pt asked if this could be a virtual appt?

## 2019-08-18 NOTE — Telephone Encounter (Signed)
Can try melatonin or unisom OTC, needs appt if this does not help or not wanting to try these

## 2019-08-18 NOTE — Telephone Encounter (Signed)
Copied from [place] [phone]. Topic: General - Other >> Aug 18, 2019  2:50 PM Hinda Lenis D wrote: Reason for CRM: PT needs over the counter mediation to help her go to sleep / please advise would she need an appointment?

## 2019-08-18 NOTE — Addendum Note (Signed)
Addended by: Merrie Roof E on: 08/18/2019 04:19 PM   Modules accepted: Orders

## 2019-08-18 NOTE — Telephone Encounter (Signed)
Called patient and left detailed message (DPR reviewed) and asked patient to return call if have any questions.

## 2019-08-18 NOTE — Telephone Encounter (Signed)
The initial message did not convey her request well. Happy to send in an [place] for her sleep study. If she is wanting to discuss long term sleep treatment this will require an appointment, though.

## 2019-08-24 DIAGNOSIS — G4730 Sleep apnea, unspecified: Secondary | ICD-10-CM | POA: Diagnosis not present

## 2019-09-06 DIAGNOSIS — M169 Osteoarthritis of hip, unspecified: Secondary | ICD-10-CM | POA: Diagnosis not present

## 2019-09-06 DIAGNOSIS — M259 Joint disorder, unspecified: Secondary | ICD-10-CM | POA: Diagnosis not present

## 2019-09-06 DIAGNOSIS — M545 Low back pain: Secondary | ICD-10-CM | POA: Diagnosis not present

## 2019-09-06 DIAGNOSIS — M5136 Other intervertebral disc degeneration, lumbar region: Secondary | ICD-10-CM | POA: Diagnosis not present

## 2019-10-03 DIAGNOSIS — G894 Chronic pain syndrome: Secondary | ICD-10-CM | POA: Diagnosis not present

## 2019-10-05 ENCOUNTER — Other Ambulatory Visit: Payer: Self-pay

## 2019-10-05 DIAGNOSIS — M79609 Pain in unspecified limb: Secondary | ICD-10-CM | POA: Diagnosis not present

## 2019-10-05 DIAGNOSIS — G8929 Other chronic pain: Secondary | ICD-10-CM | POA: Diagnosis not present

## 2019-10-05 DIAGNOSIS — M25519 Pain in unspecified shoulder: Secondary | ICD-10-CM | POA: Diagnosis not present

## 2019-10-05 DIAGNOSIS — M48062 Spinal stenosis, lumbar region with neurogenic claudication: Secondary | ICD-10-CM | POA: Diagnosis not present

## 2019-10-05 DIAGNOSIS — M792 Neuralgia and neuritis, unspecified: Secondary | ICD-10-CM | POA: Diagnosis not present

## 2019-10-05 DIAGNOSIS — M47897 Other spondylosis, lumbosacral region: Secondary | ICD-10-CM | POA: Diagnosis not present

## 2019-10-05 DIAGNOSIS — Z1211 Encounter for screening for malignant neoplasm of colon: Secondary | ICD-10-CM

## 2019-10-05 DIAGNOSIS — R519 Headache, unspecified: Secondary | ICD-10-CM | POA: Diagnosis not present

## 2019-10-05 DIAGNOSIS — M25559 Pain in unspecified hip: Secondary | ICD-10-CM | POA: Diagnosis not present

## 2019-10-05 DIAGNOSIS — G894 Chronic pain syndrome: Secondary | ICD-10-CM | POA: Diagnosis not present

## 2019-10-05 DIAGNOSIS — I739 Peripheral vascular disease, unspecified: Secondary | ICD-10-CM | POA: Diagnosis not present

## 2019-10-06 ENCOUNTER — Other Ambulatory Visit: Payer: Self-pay

## 2019-10-06 ENCOUNTER — Other Ambulatory Visit
Admission: RE | Admit: 2019-10-06 | Discharge: 2019-10-06 | Disposition: A | Discharge status: Home / Self Care | Payer: Medicare Other | Source: Ambulatory Visit | Attending: Gastroenterology | Admitting: Gastroenterology

## 2019-10-06 DIAGNOSIS — Z01812 Encounter for preprocedural laboratory examination: Secondary | ICD-10-CM | POA: Diagnosis not present

## 2019-10-06 DIAGNOSIS — Z20822 Contact with and (suspected) exposure to covid-19: Secondary | ICD-10-CM | POA: Insufficient documentation

## 2019-10-06 LAB — SARS CORONAVIRUS 2 (TAT 6-24 HRS): SARS Coronavirus 2: NEGATIVE

## 2019-10-08 ENCOUNTER — Encounter: Payer: Self-pay | Admitting: Gastroenterology

## 2019-10-08 ENCOUNTER — Encounter
Admission: RE | Disposition: A | Discharge status: Home / Self Care | Payer: Self-pay | Source: Home / Self Care | Attending: Gastroenterology

## 2019-10-08 ENCOUNTER — Ambulatory Visit: Payer: Medicare Other | Admitting: Certified Registered"

## 2019-10-08 ENCOUNTER — Other Ambulatory Visit: Payer: Self-pay

## 2019-10-08 ENCOUNTER — Ambulatory Visit
Admission: RE | Admit: 2019-10-08 | Discharge: 2019-10-08 | Disposition: A | Discharge status: Home / Self Care | Payer: Medicare Other | Attending: Gastroenterology | Admitting: Gastroenterology

## 2019-10-08 DIAGNOSIS — Z7951 Long term (current) use of inhaled steroids: Secondary | ICD-10-CM | POA: Insufficient documentation

## 2019-10-08 DIAGNOSIS — K219 Gastro-esophageal reflux disease without esophagitis: Secondary | ICD-10-CM | POA: Insufficient documentation

## 2019-10-08 DIAGNOSIS — I130 Hypertensive heart and chronic kidney disease with heart failure and stage 1 through stage 4 chronic kidney disease, or unspecified chronic kidney disease: Secondary | ICD-10-CM | POA: Diagnosis not present

## 2019-10-08 DIAGNOSIS — K635 Polyp of colon: Secondary | ICD-10-CM | POA: Diagnosis not present

## 2019-10-08 DIAGNOSIS — Z79899 Other long term (current) drug therapy: Secondary | ICD-10-CM | POA: Insufficient documentation

## 2019-10-08 DIAGNOSIS — I739 Peripheral vascular disease, unspecified: Secondary | ICD-10-CM | POA: Insufficient documentation

## 2019-10-08 DIAGNOSIS — Z800 Family history of malignant neoplasm of digestive organs: Secondary | ICD-10-CM | POA: Diagnosis not present

## 2019-10-08 DIAGNOSIS — D123 Benign neoplasm of transverse colon: Secondary | ICD-10-CM | POA: Insufficient documentation

## 2019-10-08 DIAGNOSIS — I110 Hypertensive heart disease with heart failure: Secondary | ICD-10-CM | POA: Diagnosis not present

## 2019-10-08 DIAGNOSIS — Z1211 Encounter for screening for malignant neoplasm of colon: Secondary | ICD-10-CM | POA: Insufficient documentation

## 2019-10-08 DIAGNOSIS — I509 Heart failure, unspecified: Secondary | ICD-10-CM | POA: Insufficient documentation

## 2019-10-08 DIAGNOSIS — K5730 Diverticulosis of large intestine without perforation or abscess without bleeding: Secondary | ICD-10-CM | POA: Diagnosis not present

## 2019-10-08 DIAGNOSIS — I2510 Atherosclerotic heart disease of native coronary artery without angina pectoris: Secondary | ICD-10-CM | POA: Diagnosis not present

## 2019-10-08 DIAGNOSIS — Z87891 Personal history of nicotine dependence: Secondary | ICD-10-CM | POA: Diagnosis not present

## 2019-10-08 DIAGNOSIS — J449 Chronic obstructive pulmonary disease, unspecified: Secondary | ICD-10-CM | POA: Diagnosis not present

## 2019-10-08 DIAGNOSIS — Z96642 Presence of left artificial hip joint: Secondary | ICD-10-CM | POA: Insufficient documentation

## 2019-10-08 DIAGNOSIS — N184 Chronic kidney disease, stage 4 (severe): Secondary | ICD-10-CM | POA: Diagnosis not present

## 2019-10-08 HISTORY — DX: Personal history of urinary calculi: Z87.442

## 2019-10-08 HISTORY — PX: COLONOSCOPY WITH PROPOFOL: SHX5780

## 2019-10-08 SURGERY — COLONOSCOPY WITH PROPOFOL
Anesthesia: General

## 2019-10-08 MED ORDER — GLYCOPYRROLATE 0.2 MG/ML IJ SOLN
INTRAMUSCULAR | Status: DC | PRN
  Administered 2019-10-08: .2 mg via INTRAVENOUS

## 2019-10-08 MED ORDER — PHENYLEPHRINE HCL (PRESSORS) 10 MG/ML IV SOLN
INTRAVENOUS | Status: DC | PRN
  Administered 2019-10-08: 200 ug via INTRAVENOUS

## 2019-10-08 MED ORDER — SODIUM CHLORIDE 0.9 % IV SOLN: INTRAVENOUS | Status: DC

## 2019-10-08 MED ORDER — PROPOFOL 10 MG/ML IV BOLUS
INTRAVENOUS | Status: DC | PRN
  Administered 2019-10-08: 10 mg via INTRAVENOUS
  Administered 2019-10-08: 50 mg via INTRAVENOUS

## 2019-10-08 MED ORDER — PROPOFOL 500 MG/50ML IV EMUL
INTRAVENOUS | Status: DC | PRN
  Administered 2019-10-08: 155 ug/kg/min via INTRAVENOUS

## 2019-10-08 MED ORDER — LIDOCAINE HCL (CARDIAC) PF 100 MG/5ML IV SOSY
PREFILLED_SYRINGE | INTRAVENOUS | Status: DC | PRN
  Administered 2019-10-08: 100 mg via INTRAVENOUS

## 2019-10-08 NOTE — Transfer of Care (Signed)
Immediate Anesthesia Transfer of Care Note  Patient: Amy Rocha  Procedure(s) Performed: COLONOSCOPY WITH PROPOFOL (N/A )  Patient Location: Endoscopy Unit  Anesthesia Type:General  Level of Consciousness: awake and alert   Airway & Oxygen Therapy: Patient Spontanous Breathing and Patient connected to face mask oxygen  Post-op Assessment: Report given to RN and Post -op Vital signs reviewed and stable  Post vital signs: Reviewed  Last Vitals:  Vitals Value Taken Time  BP 105/80 10/08/19 1042  Temp    Pulse 93 10/08/19 1047  Resp 20 10/08/19 1047  SpO2 100 % 10/08/19 1047  Vitals shown include unvalidated device data.  Last Pain:  Vitals:   10/08/19 1044  TempSrc:   PainSc: 7          Complications: No complications documented.

## 2019-10-08 NOTE — Anesthesia Preprocedure Evaluation (Signed)
Anesthesia Evaluation  Patient identified by MRN, date of birth, ID band Patient awake    Reviewed: Allergy & Precautions, H&P , NPO status , Patient's Chart, lab work & pertinent test results, reviewed documented beta blocker date and time   History of Anesthesia Complications (+) PONV and history of anesthetic complications  Airway Mallampati: II   Neck ROM: full    Dental  (+) Poor Dentition, Teeth Intact   Pulmonary neg shortness of breath, asthma , COPD, neg recent URI, former smoker,    Pulmonary exam normal        Cardiovascular Exercise Tolerance: Poor hypertension, On Medications (-) angina+ CAD, + Peripheral Vascular Disease and +CHF  (-) Past MI and (-) Cardiac Stents negative cardio ROS Normal cardiovascular exam(-) dysrhythmias (-) Valvular Problems/Murmurs Rhythm:regular Rate:Normal     Neuro/Psych neg Seizures PSYCHIATRIC DISORDERS Anxiety Depression  Neuromuscular disease negative psych ROS   GI/Hepatic negative GI ROS, Neg liver ROS, GERD  Medicated,  Endo/Other  negative endocrine ROS  Renal/GU CRFRenal diseasenegative Renal ROS  negative genitourinary   Musculoskeletal   Abdominal   Peds  Hematology  (+) Blood dyscrasia, anemia ,   Anesthesia Other Findings Past Medical History: No date: Anxiety No date: Arthritis No date: Asthma No date: Avascular necrosis (HCC) No date: Blood transfusion without reported diagnosis No date: Calculus of kidney No date: CHF (congestive heart failure) (HCC) No date: Chronic pain syndrome No date: Complication of anesthesia     Comment:  aspirated in recovery after  hip replacement 1998 No date: COPD (chronic obstructive pulmonary disease) (HCC) No date: Depression No date: GERD (gastroesophageal reflux disease) No date: Heart failure (HCC) No date: Hyperlipidemia No date: Hypertension No date: IBS (irritable bowel syndrome) No date: Joint pain No date:  Osteoporosis No date: Oxygen deficiency No date: Peripheral vascular disease (HCC) No date: PONV (postoperative nausea and vomiting) No date: Urge incontinence Past Surgical History: No date: ABDOMINAL HYSTERECTOMY No date: avascular necrosis 12/01/2015: CARDIAC CATHETERIZATION; Left     Comment:  Procedure: Left Heart Cath and Coronary Angiography;                Surgeon: Teodoro Spray, MD;  Location: [place] [facility]               [facility];  Service: Cardiovascular;  Laterality: Left; No date: eyelid lift No date: HERNIA REPAIR No date: JOINT REPLACEMENT     Comment:  hip No date: NASAL SINUS SURGERY 01/21/2017: TOTAL HIP REVISION; Left     Comment:  Procedure: TOTAL HIP REVISION FEMORAL STEM WITH               POLYETHYLENE EXCHANGE WITH CUPLINER;  Surgeon: Hessie Knows, MD;  Location: [facility] OR[facility];  Service: Orthopedics;               Laterality: Left; BMI    Body Mass Index:  26.18 kg/m     Reproductive/Obstetrics negative OB ROS                             Anesthesia Physical  Anesthesia Plan  ASA: III  Anesthesia Plan: General and Spinal   Post-op Pain Management:    Induction:   PONV Risk Score and Plan:   Airway Management Planned:   Additional Equipment:   Intra-op Plan:   Post-operative Plan:   Informed Consent: I  have reviewed the patients History and Physical, chart, labs and discussed the procedure including the risks, benefits and alternatives for the proposed anesthesia with the patient or authorized representative who has indicated his/her understanding and acceptance.     Dental Advisory Given  Plan Discussed with: CRNA  Anesthesia Plan Comments:         Anesthesia Quick Evaluation

## 2019-10-08 NOTE — Anesthesia Postprocedure Evaluation (Signed)
Anesthesia Post Note  Patient: Amy Rocha  Procedure(s) Performed: COLONOSCOPY WITH PROPOFOL (N/A )  Patient location during evaluation: Endoscopy Anesthesia Type: Spinal Level of consciousness: awake and alert Pain management: pain level controlled Vital Signs Assessment: post-procedure vital signs reviewed and stable Respiratory status: spontaneous breathing, nonlabored ventilation, respiratory function stable and patient connected to nasal cannula oxygen Cardiovascular status: blood pressure returned to baseline and stable Postop Assessment: no apparent nausea or vomiting Anesthetic complications: no   No complications documented.   Last Vitals:  Vitals:   10/08/19 1102 10/08/19 1112  BP: 115/75 (!) 99/57  Pulse: 88   Resp: 14 14  Temp:    SpO2: 100%     Last Pain:  Vitals:   10/08/19 1044  TempSrc:   PainSc: 7                  Martha Clan

## 2019-10-08 NOTE — H&P (Signed)
[name], MD [address], [place], [place], [place], [address] [address], [place], [place], [place], [address] Phone: [phone]  Fax: [phone]  Primary Care Physician:  Volney American, PA-C   Pre-Procedure History & Physical: HPI:  Amy Rocha is a 64 y.o. female is here for an colonoscopy.   Past Medical History:  Diagnosis Date  . Anxiety   . Arthritis   . Asthma   . Avascular necrosis ([place])   . Blood transfusion without reported diagnosis   . Calculus of kidney   . CHF (congestive heart failure) ([place])   . Chronic pain syndrome   . Complication of anesthesia    aspirated in recovery after  hip replacement 1998  . COPD (chronic obstructive pulmonary disease) ([place])   . Depression   . GERD (gastroesophageal reflux disease)   . Heart failure ([place])   . History of kidney stones   . Hyperlipidemia   . Hypertension   . IBS (irritable bowel syndrome)   . Joint pain   . Osteoporosis   . Oxygen deficiency   . Peripheral vascular disease ([place])   . PONV (postoperative nausea and vomiting)   . Urge incontinence     Past Surgical History:  Procedure Laterality Date  . ABDOMINAL HYSTERECTOMY    . avascular necrosis    . CARDIAC CATHETERIZATION Left 12/01/2015   Procedure: Left Heart Cath and Coronary Angiography;  Surgeon: Teodoro Spray, MD;  Location: [facility];  Service: Cardiovascular;  Laterality: Left;  . eyelid lift    . HERNIA REPAIR    . INTRAMEDULLARY (IM) NAIL INTERTROCHANTERIC Left 10/14/2017   Procedure: REPAIR OF LEFT GREATER TROCHANTER NONUNION;  Surgeon: Hessie Knows, MD;  Location: [facility];  Service: Orthopedics;  Laterality: Left;  . JOINT REPLACEMENT     hip  . NASAL SINUS SURGERY    . TOTAL HIP REVISION Left 01/21/2017   Procedure: TOTAL HIP REVISION FEMORAL STEM WITH POLYETHYLENE EXCHANGE WITH CUPLINER;  Surgeon: Hessie Knows, MD;  Location: [facility];  Service: Orthopedics;  Laterality: Left;    Prior to  Admission medications   Medication Sig Start Date End Date Taking? Authorizing Provider  acetaminophen (TYLENOL) 500 MG tablet Take 1,000 mg by mouth 2 (two) times daily as needed for moderate pain or headache.    Yes [provider]  albuterol (PROVENTIL HFA;VENTOLIN HFA) 108 (90 Base) MCG/ACT inhaler Inhale 2 puffs into the lungs every 6 (six) hours as needed for wheezing or shortness of breath. 04/02/18  Yes Volney American, PA-C  atorvastatin (LIPITOR) 40 MG tablet TAKE 1 TABLET BY MOUTH AT BEDTIME. 03/05/19  Yes Volney American, PA-C  budesonide-formoterol [facility]) 160-4.5 MCG/ACT inhaler Inhale 2 puffs into the lungs 2 (two) times daily as needed (shortness of breath). 05/05/19  Yes Volney American, PA-C  buPROPion (WELLBUTRIN XL) 150 MG 24 hr tablet Take 1 tablet (150 mg total) by mouth daily. 05/05/19  Yes Volney American, PA-C  clobetasol cream (TEMOVATE) 9.82 % Apply 1 application topically 2 (two) times daily. 06/24/19  Yes Cannady, Jolene T, NP  cromolyn (OPTICROM) 4 % ophthalmic solution Place 1 drop into both eyes 4 (four) times daily. Patient taking differently: Place 1 drop into both eyes 4 (four) times daily. As needed 05/05/19  Yes Volney American, PA-C  fluticasone [facility]) 50 MCG/ACT nasal spray Place 2 sprays into both nostrils daily. 05/05/19  Yes Volney American, PA-C  loratadine [facility])  10 MG tablet Take 10 mg by mouth daily as needed for allergies.   Yes [provider]  magnesium hydroxide (MILK OF MAGNESIA) 400 MG/5ML suspension Take 30 mLs by mouth daily as needed for mild constipation. 01/23/17  Yes Duanne Guess, PA-C  montelukast (SINGULAIR) 10 MG tablet Take 1 tablet (10 mg total) by mouth at bedtime. 09/24/18  Yes Volney American, PA-C  NARCAN 4 MG/0.1ML LIQD nasal spray kit Place 1 spray into the nose once.  03/24/17  Yes [provider]  omeprazole (PRILOSEC) 20 MG capsule Take 1 capsule (20 mg  total) by mouth daily. 05/05/19  Yes Volney American, PA-C  ondansetron (ZOFRAN-ODT) 4 MG disintegrating tablet Take 1 tablet (4 mg total) by mouth every 8 (eight) hours as needed for nausea or vomiting. 05/05/19  Yes Volney American, PA-C  Oxycodone HCl 20 MG TABS Take 20 mg by mouth every 4 (four) hours as needed (pain).   Yes [provider]  rOPINIRole (REQUIP) 0.5 MG tablet Take 1 tablet (0.5 mg total) by mouth at bedtime as needed. 05/05/19  Yes Volney American, PA-C  valACYclovir (VALTREX) 500 MG tablet Take 4 tablets (2,000 mg total) by mouth 2 (two) times daily as needed. Patient taking differently: Take 2,000 mg by mouth 2 (two) times daily as needed (fever blisters).  04/09/17  Yes Johnson, Megan P, DO  VITAMIN D PO Take 2,000 mg by mouth.   Yes [provider]  vortioxetine HBr (TRINTELLIX) 20 MG TABS tablet Take 1 tablet (20 mg total) by mouth daily. 05/05/19  Yes Volney American, PA-C  zolpidem (AMBIEN) 5 MG tablet Take 1 tablet (5 mg total) by mouth once for 1 dose. 08/18/19 08/18/19  Volney American, PA-C    Allergies as of 10/06/2019 - Review Complete 08/13/2019  Allergen Reaction Noted  . Fluticasone-salmeterol Swelling and Other (See Comments) 09/08/2014  . Lactose intolerance (gi) Other (See Comments) 09/06/2015  . Tetracyclines & related Other (See Comments) 09/08/2014    Family History  Problem Relation Age of Onset  . Dementia Mother   . Cancer Father        colon  . Arthritis Brother   . Hypertension Daughter   . Aneurysm Brother     Social History   Socioeconomic History  . Marital status: Married    Spouse name: Not on file  . Number of children: Not on file  . Years of education: Not on file  . Highest education level: 12th grade  Occupational History  . Occupation: disabled   Tobacco Use  . Smoking status: Former Smoker    Quit date: 02/19/1996    Years since quitting: 23.6  . Smokeless tobacco: Never  Used  Vaping Use  . Vaping Use: Never used  Substance and Sexual Activity  . Alcohol use: No    Alcohol/week: 0.0 standard drinks  . Drug use: No  . Sexual activity: Yes  Other Topics Concern  . Not on file  Social History Narrative  . Not on file   Social Determinants of Health   Financial Resource Strain:   . Difficulty of Paying Living Expenses: Not on file  Food Insecurity:   . Worried About Charity fundraiser in the Last Year: Not on file  . Ran Out of Food in the Last Year: Not on file  Transportation Needs:   . Lack of Transportation (Medical): Not on file  . Lack of Transportation (Non-Medical): Not on file  Physical Activity:   . Days of Exercise per Week: Not on file  . Minutes of Exercise per Session: Not on file  Stress:   . Feeling of Stress : Not on file  Social Connections:   . Frequency of Communication with Friends and Family: Not on file  . Frequency of Social Gatherings with Friends and Family: Not on file  . Attends Religious Services: Not on file  . Active Member of Clubs or Organizations: Not on file  . Attends Archivist Meetings: Not on file  . Marital Status: Not on file  Intimate Partner Violence:   . Fear of Current or Ex-Partner: Not on file  . Emotionally Abused: Not on file  . Physically Abused: Not on file  . Sexually Abused: Not on file    Review of Systems: See HPI, otherwise negative ROS  Physical Exam: BP 133/72   Pulse 71   Temp (!) 96.8 F (36 C) (Temporal)   Resp 20   Ht '5\' 4"'  (1.626 m)   Wt 65.8 kg   LMP 09/13/1975 (Approximate)   SpO2 99%   BMI 24.89 kg/m  General:   Alert,  pleasant and cooperative in NAD Head:  Normocephalic and atraumatic. Neck:  Supple; no masses or thyromegaly. Lungs:  Clear throughout to auscultation, normal respiratory effort.    Heart:  +S1, +S2, Regular rate and rhythm, No edema. Abdomen:  Soft, nontender and nondistended. Normal bowel sounds, without guarding, and without  rebound.   Neurologic:  Alert and  oriented x4;  grossly normal neurologically.  Impression/Plan: Amy Rocha is here for an colonoscopy to be performed for Screening colonoscopy father had colon cancer and she has had colon polyps in the past  Risks, benefits, limitations, and alternatives regarding  colonoscopy have been reviewed with the patient.  Questions have been answered.  All parties agreeable.   [name], MD  10/08/2019, 10:10 AM

## 2019-10-08 NOTE — Op Note (Signed)
[facility] Gastroenterology Patient Name: Amy Rocha Procedure Date: 10/08/2019 10:13 AM MRN: [id number] Account #: [id number] Date of Birth: Mar 25, 1955 Admit Type: Outpatient Age: 64 Room: [facility] ENDO ROOM 4 Gender: Female Note Status: Finalized Procedure:             Colonoscopy Indications:           Screening in patient at increased risk: Family history                         of 1st-degree relative with colorectal cancer Providers:             [name] MD, MD Referring MD:          Volney American (Referring MD) Medicines:             Monitored Anesthesia Care Complications:         No immediate complications. Procedure:             Pre-Anesthesia Assessment:                        - Prior to the procedure, a History and Physical was                         performed, and patient medications, allergies and                         sensitivities were reviewed. The patient's tolerance                         of previous anesthesia was reviewed.                        - The risks and benefits of the procedure and the                         sedation options and risks were discussed with the                         patient. All questions were answered and informed                         consent was obtained.                        - ASA Grade Assessment: II - A patient with mild                         systemic disease.                        After obtaining informed consent, the colonoscope was                         passed under direct vision. Throughout the procedure,                         the patient's blood pressure, pulse, and oxygen                         saturations were monitored  continuously. The                         Colonoscope was introduced through the anus and                         advanced to the the cecum, identified by the                         appendiceal orifice. The colonoscopy was performed                         with  ease. The patient tolerated the procedure well.                         The quality of the bowel preparation was excellent. Findings:      The perianal and digital rectal examinations were normal.      A 3 mm polyp was found in the ascending colon. The polyp was sessile.       The polyp was removed with a cold biopsy forceps. Resection and       retrieval were complete.      A 5 mm polyp was found in the transverse colon. The polyp was sessile.       The polyp was removed with a cold snare. Resection and retrieval were       complete.      Multiple small-mouthed diverticula were found in the sigmoid colon.      The exam was otherwise without abnormality on direct and retroflexion       views. Impression:            - One 3 mm polyp in the ascending colon, removed with                         a cold biopsy forceps. Resected and retrieved.                        - One 5 mm polyp in the transverse colon, removed with                         a cold snare. Resected and retrieved.                        - Diverticulosis in the sigmoid colon.                        - The examination was otherwise normal on direct and                         retroflexion views. Recommendation:        - Discharge patient to home (with escort).                        - Resume previous diet.                        - Continue present medications.                        - Await pathology results.                        -  Repeat colonoscopy for surveillance based on                         pathology results. Procedure Code(s):     --- Professional ---                        [phone], Colonoscopy, flexible; with removal of                         tumor(s), polyp(s), or other lesion(s) by snare                         technique                        45380, 98, Colonoscopy, flexible; with biopsy, single                         or multiple Diagnosis Code(s):     --- Professional ---                        Z80.0, Family  history of malignant neoplasm of                         digestive organs                        K63.5, Polyp of colon                        K57.30, Diverticulosis of large intestine without                         perforation or abscess without bleeding CPT copyright 2019 American Medical Association. All rights reserved. The codes documented in this report are preliminary and upon coder review may  be revised to meet current compliance requirements. [name], MD [name] MD, MD 10/08/2019 10:39:26 AM This report has been signed electronically. Number of Addenda: 0 Note Initiated On: 10/08/2019 10:13 AM Scope Withdrawal Time: 0 hours 13 minutes 25 seconds  Total Procedure Duration: 0 hours 19 minutes 18 seconds  Estimated Blood Loss:  Estimated blood loss: none.      [facility]

## 2019-10-08 NOTE — Anesthesia Procedure Notes (Signed)
Procedure Name: General with mask airway Performed by: Fletcher-Harrison, Ary Rudnick, CRNA Pre-anesthesia Checklist: Patient identified, Emergency Drugs available, Suction available and Patient being monitored Patient Re-evaluated:Patient Re-evaluated prior to induction Oxygen Delivery Method: Simple face mask Induction Type: IV induction Placement Confirmation: positive ETCO2 and CO2 detector Dental Injury: Teeth and Oropharynx as per pre-operative assessment        

## 2019-10-11 ENCOUNTER — Encounter: Payer: Self-pay | Admitting: Gastroenterology

## 2019-10-11 LAB — SURGICAL PATHOLOGY

## 2019-10-12 ENCOUNTER — Encounter: Payer: Self-pay | Admitting: Gastroenterology

## 2019-10-26 ENCOUNTER — Telehealth: Payer: Self-pay

## 2019-10-26 NOTE — Telephone Encounter (Signed)
Patient states that since she had her colonoscopy she has been getting choke on her food more frequently.  I've advised her to eat only soft foods.  She wants to know if it would be possible to schedule her for an EGD with dilation.  I told her I would check with you to see.  She said she has had this done in the past but I didn't see it in chart.  Please advise.  Thanks,  [place], [place]

## 2019-10-27 NOTE — Telephone Encounter (Signed)
WE have not seen her in the past at the office - she was open access colonoscopy . If having dysphagia will need an office visit - virtual video or in officice before scheduling EGD  If ok schedule visit within 2 weeks

## 2019-10-27 NOTE — Telephone Encounter (Signed)
Called pt and scheduled her for ov with Dr. Vicente Males on 9.9.21 @1 :45PM.

## 2019-10-28 ENCOUNTER — Other Ambulatory Visit: Payer: Self-pay

## 2019-10-28 ENCOUNTER — Ambulatory Visit (INDEPENDENT_AMBULATORY_CARE_PROVIDER_SITE_OTHER): Payer: Medicare Other | Admitting: Gastroenterology

## 2019-10-28 VITALS — BP 118/72 | HR 87 | Temp 97.4°F | Ht 63.0 in | Wt 148.0 lb

## 2019-10-28 DIAGNOSIS — R1310 Dysphagia, unspecified: Secondary | ICD-10-CM | POA: Diagnosis not present

## 2019-10-28 NOTE — Progress Notes (Signed)
[name] MD, MRCP(U.K) [address]  [place]  [place], [place] [address]  Main: [phone]  Fax: [phone]   Gastroenterology Consultation  Referring Provider:     Volney American,* Primary Care Physician:  Lesleigh Noe, MD Primary Gastroenterologist:  Dr. [name]  Reason for Consultation:     Dysphagia         HPI:   Amy Rocha is a 64 y.o. y/o female here to see me today for dysphagia.   I have last performed a colonoscopy on her on 10/08/2019.  She has never been seen in the office before.  She has a family history of colorectal cancer.  Colonoscopy demonstrated 2 polyps 3 to 5 mm in size which were completely resected,  benign but one of them was a tubular adenoma   Today she is here to see me for dysphagia  Dysphagia: Onset and any progression: Began after her colonoscopy Frequency: Every time she eats Foods affected : Solids and liquids Prior episodes of impaction: No History of asthma/allergy : Yes History of heartburn/Reflux : Yes Weight loss/weight gain : No Prior EGD: No PPI/H2 blocker use : Yes, Prilosec 20 mg at night before bedtime   Past Medical History:  Diagnosis Date  . Anxiety   . Arthritis   . Asthma   . Avascular necrosis ([place])   . Blood transfusion without reported diagnosis   . Calculus of kidney   . CHF (congestive heart failure) ([place])   . Chronic pain syndrome   . Complication of anesthesia    aspirated in recovery after  hip replacement 1998  . COPD (chronic obstructive pulmonary disease) ([place])   . Depression   . GERD (gastroesophageal reflux disease)   . Heart failure ([place])   . History of kidney stones   . Hyperlipidemia   . Hypertension   . IBS (irritable bowel syndrome)   . Joint pain   . Osteoporosis   . Oxygen deficiency   . Peripheral vascular disease ([place])   . PONV (postoperative nausea and vomiting)   . Urge incontinence     Past Surgical History:  Procedure Laterality Date  .  ABDOMINAL HYSTERECTOMY    . avascular necrosis    . CARDIAC CATHETERIZATION Left 12/01/2015   Procedure: Left Heart Cath and Coronary Angiography;  Surgeon: Teodoro Spray, MD;  Location: [facility];  Service: Cardiovascular;  Laterality: Left;  . COLONOSCOPY WITH PROPOFOL N/A 10/08/2019   Procedure: COLONOSCOPY WITH PROPOFOL;  Surgeon: [name], MD;  Location: [facility] ENDOSCOPY;  Service: Gastroenterology;  Laterality: N/A;  . eyelid lift    . HERNIA REPAIR    . INTRAMEDULLARY (IM) NAIL INTERTROCHANTERIC Left 10/14/2017   Procedure: REPAIR OF LEFT GREATER TROCHANTER NONUNION;  Surgeon: Hessie Knows, MD;  Location: [facility] ORS;  Service: Orthopedics;  Laterality: Left;  . JOINT REPLACEMENT     hip  . NASAL SINUS SURGERY    . TOTAL HIP REVISION Left 01/21/2017   Procedure: TOTAL HIP REVISION FEMORAL STEM WITH POLYETHYLENE EXCHANGE WITH CUPLINER;  Surgeon: Hessie Knows, MD;  Location: [facility] ORS;  Service: Orthopedics;  Laterality: Left;    Prior to Admission medications   Medication Sig Start Date End Date Taking? Authorizing Provider  acetaminophen (TYLENOL) 500 MG tablet Take 1,000 mg by mouth 2 (two) times daily as needed for moderate pain or headache.     [provider]  albuterol (PROVENTIL HFA;VENTOLIN HFA) 108 (90 Base) MCG/ACT inhaler Inhale 2 puffs  into the lungs every 6 (six) hours as needed for wheezing or shortness of breath. 04/02/18   Volney American, PA-C  atorvastatin (LIPITOR) 40 MG tablet TAKE 1 TABLET BY MOUTH AT BEDTIME. 03/05/19   Volney American, PA-C  budesonide-formoterol [facility]) 160-4.5 MCG/ACT inhaler Inhale 2 puffs into the lungs 2 (two) times daily as needed (shortness of breath). 05/05/19   Volney American, PA-C  buPROPion (WELLBUTRIN XL) 150 MG 24 hr tablet Take 1 tablet (150 mg total) by mouth daily. 05/05/19   Volney American, PA-C  clobetasol cream (TEMOVATE) 8.31 % Apply 1 application topically 2 (two) times daily.  06/24/19   Cannady, Henrine Screws T, NP  cromolyn (OPTICROM) 4 % ophthalmic solution Place 1 drop into both eyes 4 (four) times daily. Patient taking differently: Place 1 drop into both eyes 4 (four) times daily. As needed 05/05/19   Volney American, PA-C  fluticasone [facility]) 50 MCG/ACT nasal spray Place 2 sprays into both nostrils daily. 05/05/19   Volney American, PA-C  loratadine (CLARITIN) 10 MG tablet Take 10 mg by mouth daily as needed for allergies.    [provider]  magnesium hydroxide (MILK OF MAGNESIA) 400 MG/5ML suspension Take 30 mLs by mouth daily as needed for mild constipation. 01/23/17   Duanne Guess, PA-C  montelukast (SINGULAIR) 10 MG tablet Take 1 tablet (10 mg total) by mouth at bedtime. 09/24/18   Volney American, PA-C  NARCAN 4 MG/0.1ML LIQD nasal spray kit Place 1 spray into the nose once.  03/24/17   [provider]  omeprazole (PRILOSEC) 20 MG capsule Take 1 capsule (20 mg total) by mouth daily. 05/05/19   Volney American, PA-C  ondansetron (ZOFRAN-ODT) 4 MG disintegrating tablet Take 1 tablet (4 mg total) by mouth every 8 (eight) hours as needed for nausea or vomiting. 05/05/19   Volney American, PA-C  Oxycodone HCl 20 MG TABS Take 20 mg by mouth every 4 (four) hours as needed (pain).    [provider]  rOPINIRole (REQUIP) 0.5 MG tablet Take 1 tablet (0.5 mg total) by mouth at bedtime as needed. 05/05/19   Volney American, PA-C  valACYclovir (VALTREX) 500 MG tablet Take 4 tablets (2,000 mg total) by mouth 2 (two) times daily as needed. Patient taking differently: Take 2,000 mg by mouth 2 (two) times daily as needed (fever blisters).  04/09/17   Johnson, Megan P, DO  VITAMIN D PO Take 2,000 mg by mouth.    [provider]  vortioxetine HBr (TRINTELLIX) 20 MG TABS tablet Take 1 tablet (20 mg total) by mouth daily. 05/05/19   Volney American, PA-C  zolpidem (AMBIEN) 5 MG tablet Take 1 tablet (5 mg total)  by mouth once for 1 dose. 08/18/19 08/18/19  Volney American, PA-C    Family History  Problem Relation Age of Onset  . Dementia Mother   . Cancer Father        colon  . Arthritis Brother   . Hypertension Daughter   . Aneurysm Brother      Social History   Tobacco Use  . Smoking status: Former Smoker    Quit date: 02/19/1996    Years since quitting: 23.7  . Smokeless tobacco: Never Used  Vaping Use  . Vaping Use: Never used  Substance Use Topics  . Alcohol use: No    Alcohol/week: 0.0 standard drinks  . Drug use: No    Allergies as of 10/28/2019 - Review  Complete 10/08/2019  Allergen Reaction Noted  . Fluticasone-salmeterol Swelling and Other (See Comments) 09/08/2014  . Lactose intolerance (gi) Other (See Comments) 09/06/2015  . Tetracyclines & related Other (See Comments) 09/08/2014    Review of Systems:    All systems reviewed and negative except where noted in HPI.   Physical Exam:  LMP 09/13/1975 (Approximate)  Patient's last menstrual period was 09/13/1975 (approximate). Psych:  Alert and cooperative. Normal mood and affect. General:   Alert,  Well-developed, well-nourished, pleasant and cooperative in NAD Head:  Normocephalic and atraumatic. Eyes:  Sclera clear, no icterus.   Conjunctiva pink. Ears:  Normal auditory acuity. Lungs:  Respirations even and unlabored.  Clear throughout to auscultation.   No wheezes, crackles, or rhonchi. No acute distress. Heart:  Regular rate and rhythm; no murmurs, clicks, rubs, or gallops. Abdomen:  Normal bowel sounds.  No bruits.  Soft, non-tender and non-distended without masses, hepatosplenomegaly or hernias noted.  No guarding or rebound tenderness.    Neurologic:  Alert and oriented x3;  grossly normal neurologically. Psych:  Alert and cooperative. Normal mood and affect.  Imaging Studies: No results found.  Assessment and Plan:   Amy Rocha is a 64 y.o. y/o female has been referred for dysphagia.  She has  dysphagia for solids and liquids likely secondary to acid reflux and possible esophagitis  Plan 1.  EGD 2.  Commence on 40 mg of Prilosec taken first thing in the morning on an empty stomach.  Presently she takes 20 mg at night before bedtime which I explained to her does not work as well after meals.  I have discussed alternative options, risks & benefits,  which include, but are not limited to, bleeding, infection, perforation,respiratory complication & drug reaction.  The patient agrees with this plan & written consent will be obtained.     Follow up in 6 weeks telephone visit  Dr [name] MD,MRCP(U.K)

## 2019-11-02 DIAGNOSIS — M5136 Other intervertebral disc degeneration, lumbar region: Secondary | ICD-10-CM | POA: Diagnosis not present

## 2019-11-02 DIAGNOSIS — M545 Low back pain: Secondary | ICD-10-CM | POA: Diagnosis not present

## 2019-11-02 DIAGNOSIS — M169 Osteoarthritis of hip, unspecified: Secondary | ICD-10-CM | POA: Diagnosis not present

## 2019-11-02 DIAGNOSIS — M259 Joint disorder, unspecified: Secondary | ICD-10-CM | POA: Diagnosis not present

## 2019-11-04 ENCOUNTER — Other Ambulatory Visit
Admission: RE | Admit: 2019-11-04 | Discharge: 2019-11-04 | Disposition: A | Discharge status: Home / Self Care | Payer: Medicare Other | Source: Ambulatory Visit | Attending: Gastroenterology | Admitting: Gastroenterology

## 2019-11-04 ENCOUNTER — Other Ambulatory Visit: Payer: Self-pay

## 2019-11-04 DIAGNOSIS — Z20822 Contact with and (suspected) exposure to covid-19: Secondary | ICD-10-CM | POA: Insufficient documentation

## 2019-11-04 DIAGNOSIS — Z01812 Encounter for preprocedural laboratory examination: Secondary | ICD-10-CM | POA: Diagnosis not present

## 2019-11-05 LAB — SARS CORONAVIRUS 2 (TAT 6-24 HRS): SARS Coronavirus 2: NEGATIVE

## 2019-11-08 ENCOUNTER — Ambulatory Visit: Payer: Medicare Other | Admitting: Registered Nurse

## 2019-11-08 ENCOUNTER — Other Ambulatory Visit: Payer: Self-pay

## 2019-11-08 ENCOUNTER — Ambulatory Visit
Admission: RE | Admit: 2019-11-08 | Discharge: 2019-11-08 | Disposition: A | Discharge status: Home / Self Care | Payer: Medicare Other | Attending: Gastroenterology | Admitting: Gastroenterology

## 2019-11-08 ENCOUNTER — Encounter
Admission: RE | Disposition: A | Discharge status: Home / Self Care | Payer: Self-pay | Source: Home / Self Care | Attending: Gastroenterology

## 2019-11-08 ENCOUNTER — Encounter: Payer: Self-pay | Admitting: Gastroenterology

## 2019-11-08 DIAGNOSIS — I509 Heart failure, unspecified: Secondary | ICD-10-CM | POA: Diagnosis not present

## 2019-11-08 DIAGNOSIS — N184 Chronic kidney disease, stage 4 (severe): Secondary | ICD-10-CM | POA: Diagnosis not present

## 2019-11-08 DIAGNOSIS — M1990 Unspecified osteoarthritis, unspecified site: Secondary | ICD-10-CM | POA: Insufficient documentation

## 2019-11-08 DIAGNOSIS — Z96642 Presence of left artificial hip joint: Secondary | ICD-10-CM | POA: Insufficient documentation

## 2019-11-08 DIAGNOSIS — K222 Esophageal obstruction: Secondary | ICD-10-CM | POA: Diagnosis not present

## 2019-11-08 DIAGNOSIS — Z87891 Personal history of nicotine dependence: Secondary | ICD-10-CM | POA: Diagnosis not present

## 2019-11-08 DIAGNOSIS — L83 Acanthosis nigricans: Secondary | ICD-10-CM | POA: Diagnosis not present

## 2019-11-08 DIAGNOSIS — Z79899 Other long term (current) drug therapy: Secondary | ICD-10-CM | POA: Insufficient documentation

## 2019-11-08 DIAGNOSIS — R1310 Dysphagia, unspecified: Secondary | ICD-10-CM | POA: Diagnosis not present

## 2019-11-08 DIAGNOSIS — I739 Peripheral vascular disease, unspecified: Secondary | ICD-10-CM | POA: Diagnosis not present

## 2019-11-08 DIAGNOSIS — K219 Gastro-esophageal reflux disease without esophagitis: Secondary | ICD-10-CM | POA: Diagnosis not present

## 2019-11-08 DIAGNOSIS — I110 Hypertensive heart disease with heart failure: Secondary | ICD-10-CM | POA: Insufficient documentation

## 2019-11-08 DIAGNOSIS — F419 Anxiety disorder, unspecified: Secondary | ICD-10-CM | POA: Diagnosis not present

## 2019-11-08 DIAGNOSIS — I130 Hypertensive heart and chronic kidney disease with heart failure and stage 1 through stage 4 chronic kidney disease, or unspecified chronic kidney disease: Secondary | ICD-10-CM | POA: Diagnosis not present

## 2019-11-08 DIAGNOSIS — E785 Hyperlipidemia, unspecified: Secondary | ICD-10-CM | POA: Insufficient documentation

## 2019-11-08 DIAGNOSIS — J449 Chronic obstructive pulmonary disease, unspecified: Secondary | ICD-10-CM | POA: Diagnosis not present

## 2019-11-08 DIAGNOSIS — Z7951 Long term (current) use of inhaled steroids: Secondary | ICD-10-CM | POA: Insufficient documentation

## 2019-11-08 HISTORY — PX: ESOPHAGOGASTRODUODENOSCOPY (EGD) WITH PROPOFOL: SHX5813

## 2019-11-08 SURGERY — ESOPHAGOGASTRODUODENOSCOPY (EGD) WITH PROPOFOL
Anesthesia: General

## 2019-11-08 MED ORDER — GLYCOPYRROLATE 0.2 MG/ML IJ SOLN
INTRAMUSCULAR | Status: DC | PRN
  Administered 2019-11-08: .2 mg via INTRAVENOUS

## 2019-11-08 MED ORDER — PROPOFOL 500 MG/50ML IV EMUL
INTRAVENOUS | Status: DC | PRN
  Administered 2019-11-08: 140 ug/kg/min via INTRAVENOUS

## 2019-11-08 MED ORDER — LIDOCAINE HCL (CARDIAC) PF 100 MG/5ML IV SOSY
PREFILLED_SYRINGE | INTRAVENOUS | Status: DC | PRN
  Administered 2019-11-08: 100 mg via INTRAVENOUS

## 2019-11-08 MED ORDER — PROPOFOL 10 MG/ML IV BOLUS
INTRAVENOUS | Status: DC | PRN
  Administered 2019-11-08: 20 mg via INTRAVENOUS
  Administered 2019-11-08: 80 mg via INTRAVENOUS

## 2019-11-08 MED ORDER — SODIUM CHLORIDE 0.9 % IV SOLN: INTRAVENOUS | Status: DC

## 2019-11-08 NOTE — H&P (Signed)
Amy Bellows, MD [address] [place], [place], [place], [address] [address], [place], [place], [place], [address] Phone: [phone]  Fax: [phone]  Primary Care Physician:  Amy Noe, MD   Pre-Procedure History & Physical: HPI:  Amy Rocha is a 64 y.o. female is here for an endoscopy    Past Medical History:  Diagnosis Date  . Anxiety   . Arthritis   . Asthma   . Avascular necrosis ([place])   . Blood transfusion without reported diagnosis   . Calculus of kidney   . CHF (congestive heart failure) ([place])   . Chronic pain syndrome   . Complication of anesthesia    aspirated in recovery after  hip replacement 1998  . COPD (chronic obstructive pulmonary disease) ([place])   . Depression   . GERD (gastroesophageal reflux disease)   . Heart failure ([place])   . History of kidney stones   . Hyperlipidemia   . Hypertension   . IBS (irritable bowel syndrome)   . Joint pain   . Osteoporosis   . Oxygen deficiency   . Peripheral vascular disease ([place])   . PONV (postoperative nausea and vomiting)   . Urge incontinence     Past Surgical History:  Procedure Laterality Date  . ABDOMINAL HYSTERECTOMY    . avascular necrosis    . CARDIAC CATHETERIZATION Left 12/01/2015   Procedure: Left Heart Cath and Coronary Angiography;  Surgeon: Teodoro Spray, MD;  Location: [facility] CV LAB;  Service: Cardiovascular;  Laterality: Left;  . COLONOSCOPY WITH PROPOFOL N/A 10/08/2019   Procedure: COLONOSCOPY WITH PROPOFOL;  Surgeon: Amy Bellows, MD;  Location: [facility] ENDOSCOPY;  Service: Gastroenterology;  Laterality: N/A;  . eyelid lift    . HERNIA REPAIR    . INTRAMEDULLARY (IM) NAIL INTERTROCHANTERIC Left 10/14/2017   Procedure: REPAIR OF LEFT GREATER TROCHANTER NONUNION;  Surgeon: Hessie Knows, MD;  Location: [facility] ORS;  Service: Orthopedics;  Laterality: Left;  . JOINT REPLACEMENT     hip  . NASAL SINUS SURGERY    . TOTAL HIP REVISION Left 01/21/2017   Procedure: TOTAL  HIP REVISION FEMORAL STEM WITH POLYETHYLENE EXCHANGE WITH CUPLINER;  Surgeon: Hessie Knows, MD;  Location: [facility] ORS;  Service: Orthopedics;  Laterality: Left;    Prior to Admission medications   Medication Sig Start Date End Date Taking? Authorizing Provider  omeprazole (PRILOSEC) 20 MG capsule Take 1 capsule (20 mg total) by mouth daily. 05/05/19  Yes Volney American, PA-C  Oxycodone HCl 20 MG TABS Take 20 mg by mouth every 4 (four) hours as needed (pain).   Yes [provider]  vortioxetine HBr (TRINTELLIX) 20 MG TABS tablet Take 1 tablet (20 mg total) by mouth daily. 05/05/19  Yes Volney American, PA-C  acetaminophen (TYLENOL) 500 MG tablet Take 1,000 mg by mouth 2 (two) times daily as needed for moderate pain or headache.     [provider]  albuterol (PROVENTIL HFA;VENTOLIN HFA) 108 (90 Base) MCG/ACT inhaler Inhale 2 puffs into the lungs every 6 (six) hours as needed for wheezing or shortness of breath. 04/02/18   Volney American, PA-C  atorvastatin (LIPITOR) 40 MG tablet TAKE 1 TABLET BY MOUTH AT BEDTIME. 03/05/19   Volney American, PA-C  budesonide-formoterol [facility]) 160-4.5 MCG/ACT inhaler Inhale 2 puffs into the lungs 2 (two) times daily as needed (shortness of breath). 05/05/19   Volney American, PA-C  buPROPion (WELLBUTRIN XL) 150 MG 24 hr tablet Take  1 tablet (150 mg total) by mouth daily. 05/05/19   Volney American, PA-C  clobetasol cream (TEMOVATE) 2.54 % Apply 1 application topically 2 (two) times daily. Patient not taking: Reported on 10/28/2019 06/24/19   Marnee Guarneri T, NP  cromolyn (OPTICROM) 4 % ophthalmic solution Place 1 drop into both eyes 4 (four) times daily. Patient taking differently: Place 1 drop into both eyes 4 (four) times daily. As needed 05/05/19   Volney American, PA-C  fluticasone [facility]) 50 MCG/ACT nasal spray Place 2 sprays into both nostrils daily. 05/05/19   Volney American, PA-C    loratadine (CLARITIN) 10 MG tablet Take 10 mg by mouth daily as needed for allergies.    [provider]  magnesium hydroxide (MILK OF MAGNESIA) 400 MG/5ML suspension Take 30 mLs by mouth daily as needed for mild constipation. Patient not taking: Reported on 10/28/2019 01/23/17   Duanne Guess, PA-C  montelukast (SINGULAIR) 10 MG tablet Take 1 tablet (10 mg total) by mouth at bedtime. 09/24/18   Volney American, PA-C  NARCAN 4 MG/0.1ML LIQD nasal spray kit Place 1 spray into the nose once.  03/24/17   [provider]  ondansetron (ZOFRAN-ODT) 4 MG disintegrating tablet Take 1 tablet (4 mg total) by mouth every 8 (eight) hours as needed for nausea or vomiting. 05/05/19   Volney American, PA-C  rOPINIRole (REQUIP) 0.5 MG tablet Take 1 tablet (0.5 mg total) by mouth at bedtime as needed. 05/05/19   Volney American, PA-C  valACYclovir (VALTREX) 500 MG tablet Take 4 tablets (2,000 mg total) by mouth 2 (two) times daily as needed. Patient taking differently: Take 2,000 mg by mouth 2 (two) times daily as needed (fever blisters).  04/09/17   Johnson, Megan P, DO  VITAMIN D PO Take 2,000 mg by mouth.    [provider]  zolpidem (AMBIEN) 5 MG tablet Take 1 tablet (5 mg total) by mouth once for 1 dose. 08/18/19 08/18/19  Volney American, PA-C    Allergies as of 10/28/2019 - Review Complete 10/28/2019  Allergen Reaction Noted  . Fluticasone-salmeterol Swelling and Other (See Comments) 09/08/2014  . Lactose intolerance (gi) Other (See Comments) 09/06/2015  . Tetracyclines & related Other (See Comments) 09/08/2014    Family History  Problem Relation Age of Onset  . Dementia Mother   . Cancer Father        colon  . Arthritis Brother   . Hypertension Daughter   . Aneurysm Brother     Social History   Socioeconomic History  . Marital status: Married    Spouse name: Not on file  . Number of children: Not on file  . Years of education: Not on file  .  Highest education level: 12th grade  Occupational History  . Occupation: disabled   Tobacco Use  . Smoking status: Former Smoker    Quit date: 02/19/1996    Years since quitting: 23.7  . Smokeless tobacco: Never Used  Vaping Use  . Vaping Use: Never used  Substance and Sexual Activity  . Alcohol use: No    Alcohol/week: 0.0 standard drinks  . Drug use: No  . Sexual activity: Yes  Other Topics Concern  . Not on file  Social History Narrative  . Not on file   Social Determinants of Health   Financial Resource Strain:   . Difficulty of Paying Living Expenses: Not on file  Food Insecurity:   . Worried About Charity fundraiser  in the Last Year: Not on file  . Ran Out of Food in the Last Year: Not on file  Transportation Needs:   . Lack of Transportation (Medical): Not on file  . Lack of Transportation (Non-Medical): Not on file  Physical Activity:   . Days of Exercise per Week: Not on file  . Minutes of Exercise per Session: Not on file  Stress:   . Feeling of Stress : Not on file  Social Connections:   . Frequency of Communication with Friends and Family: Not on file  . Frequency of Social Gatherings with Friends and Family: Not on file  . Attends Religious Services: Not on file  . Active Member of Clubs or Organizations: Not on file  . Attends Archivist Meetings: Not on file  . Marital Status: Not on file  Intimate Partner Violence:   . Fear of Current or Ex-Partner: Not on file  . Emotionally Abused: Not on file  . Physically Abused: Not on file  . Sexually Abused: Not on file    Review of Systems: See HPI, otherwise negative ROS  Physical Exam: LMP 09/13/1975 (Approximate)  General:   Alert,  pleasant and cooperative in NAD Head:  Normocephalic and atraumatic. Neck:  Supple; no masses or thyromegaly. Lungs:  Clear throughout to auscultation, normal respiratory effort.    Heart:  +S1, +S2, Regular rate and rhythm, No edema. Abdomen:  Soft, nontender  and nondistended. Normal bowel sounds, without guarding, and without rebound.   Neurologic:  Alert and  oriented x4;  grossly normal neurologically.  Impression/Plan: JAMYLA ARD is here for an endoscopy  to be performed for  evaluation of dysphagia    Risks, benefits, limitations, and alternatives regarding endoscopy have been reviewed with the patient.  Questions have been answered.  All parties agreeable.   Amy Bellows, MD  11/08/2019, 8:57 AM

## 2019-11-08 NOTE — Transfer of Care (Signed)
Immediate Anesthesia Transfer of Care Note  Patient: Amy Rocha  Procedure(s) Performed: ESOPHAGOGASTRODUODENOSCOPY (EGD) WITH PROPOFOL (N/A )  Patient Location: PACU and Endoscopy Unit  Anesthesia Type:General  Level of Consciousness: awake  Airway & Oxygen Therapy: Patient Spontanous Breathing and Patient connected to nasal cannula oxygen  Post-op Assessment: Report given to RN and Post -op Vital signs reviewed and stable  Post vital signs: Reviewed  Last Vitals:  Vitals Value Taken Time  BP    Temp    Pulse    Resp    SpO2      Last Pain:  Vitals:   11/08/19 0856  TempSrc: Tympanic  PainSc: 5          Complications: No complications documented.

## 2019-11-08 NOTE — Op Note (Signed)
[facility] Patient Name: Amy Rocha Procedure Date: 11/08/2019 9:01 AM MRN: [id number] Account #: [id number] Date of Birth: [birth year] Admit Type: Outpatient Age: 64 Room: [facility] ENDO ROOM 4 Gender: Female Note Status: Finalized Procedure:             Upper GI endoscopy Indications:           Dysphagia Providers:             [name] MD, MD Referring MD:          Jobe Marker. Einar Pheasant (Referring MD) Medicines:             Monitored Anesthesia Care Complications:         No immediate complications. Procedure:             Pre-Anesthesia Assessment:                        - Prior to the procedure, a History and Physical was                         performed, and patient medications, allergies and                         sensitivities were reviewed. The patient's tolerance                         of previous anesthesia was reviewed.                        - The risks and benefits of the procedure and the                         sedation options and risks were discussed with the                         patient. All questions were answered and informed                         consent was obtained.                        - ASA Grade Assessment: II - A patient with mild                         systemic disease.                        After obtaining informed consent, the endoscope was                         passed under direct vision. Throughout the procedure,                         the patient's blood pressure, pulse, and oxygen                         saturations were monitored continuously. The Endoscope                         was introduced through the mouth, and advanced to  the                         third part of duodenum. The upper GI endoscopy was                         accomplished with ease. The patient tolerated the                         procedure well. Findings:      The examined duodenum was normal.      The stomach was normal.       One benign-appearing, intrinsic mild stenosis was found at the       gastroesophageal junction. This stenosis measured 1.6 cm (inner       diameter) x less than one cm (in length). The stenosis was traversed. A       TTS dilator was passed through the scope. Dilation with a 15-16.5-18 mm       balloon dilator was performed to 18 mm. The dilation site was examined       and showed complete resolution of luminal narrowing.      The cardia and gastric fundus were normal on retroflexion.      Normal mucosa was found in the entire esophagus. Biopsies were taken       with a cold forceps for histology. Impression:            - Normal examined duodenum.                        - Normal stomach.                        - Benign-appearing esophageal stenosis. Dilated.                        - No specimens collected. Recommendation:        - Discharge patient to home (with escort).                        - Resume previous diet.                        - Continue present medications.                        - Await pathology results.                        - Return to my office as previously scheduled. Procedure Code(s):     --- Professional ---                        [phone], Esophagogastroduodenoscopy, flexible,                         transoral; with transendoscopic balloon dilation of                         esophagus (less than 30 mm diameter)                        43239, 59, Esophagogastroduodenoscopy, flexible,  transoral; with biopsy, single or multiple Diagnosis Code(s):     --- Professional ---                        K22.2, Esophageal obstruction                        R13.10, Dysphagia, unspecified CPT copyright 2019 American Medical Association. All rights reserved. The codes documented in this report are preliminary and upon coder review may  be revised to meet current compliance requirements. [name], MD [name] MD, MD 11/08/2019 9:27:26 AM This report has  been signed electronically. Number of Addenda: 0 Note Initiated On: 11/08/2019 9:01 AM Estimated Blood Loss:  Estimated blood loss: none. Estimated blood loss: none.      [facility]

## 2019-11-08 NOTE — Anesthesia Preprocedure Evaluation (Addendum)
Anesthesia Evaluation  Patient identified by MRN, date of birth, ID band Patient awake  General Assessment Comment:Aspirated after general anesthesia in the '90s, spent more than 30 days in a coma per patient  Reviewed: Allergy & Precautions, H&P , NPO status , Patient's Chart, lab work & pertinent test results, reviewed documented beta blocker date and time   History of Anesthesia Complications (+) PONV and history of anesthetic complications  Airway Mallampati: II  TM Distance: >3 FB Neck ROM: full    Dental  (+) Upper Dentures, Lower Dentures Dentures cemented in:   Pulmonary neg shortness of breath, asthma , COPD, neg recent URI, Patient abstained from smoking.Not current smoker, former smoker,  Does not take inhalers, never hospitalized, very mild   Pulmonary exam normal breath sounds clear to auscultation       Cardiovascular Exercise Tolerance: Poor hypertension, On Medications (-) angina+ CAD and + Peripheral Vascular Disease  (-) Past MI, (-) Cardiac Stents and (-) CHF Normal cardiovascular exam(-) dysrhythmias (-) Valvular Problems/Murmurs Rhythm:regular Rate:Normal - Systolic murmurs Cath 1740:  Mid RCA lesion, 30 %stenosed.  The left ventricular systolic function is normal.  LV end diastolic pressure is normal.  The left ventricular ejection fraction is greater than 65% by visual estimate.  There is no aortic valve stenosis.   No significant coronary artery disease LV function normal    Neuro/Psych neg Seizures PSYCHIATRIC DISORDERS Anxiety Depression On chronic opioids (about 80mg  oxycodone daily) for hip pain  Neuromuscular disease    GI/Hepatic Neg liver ROS, GERD  Medicated and Controlled,  Endo/Other  negative endocrine ROS  Renal/GU CRFRenal disease  negative genitourinary   Musculoskeletal   Abdominal   Peds  Hematology  (+) Blood dyscrasia, anemia ,   Anesthesia Other Findings Past  Medical History: No date: Anxiety No date: Arthritis No date: Asthma No date: Avascular necrosis (HCC) No date: Blood transfusion without reported diagnosis No date: Calculus of kidney No date: CHF (congestive heart failure) (HCC) No date: Chronic pain syndrome No date: Complication of anesthesia     Comment:  aspirated in recovery after  hip replacement 1998 No date: COPD (chronic obstructive pulmonary disease) (HCC) No date: Depression No date: GERD (gastroesophageal reflux disease) No date: Heart failure (HCC) No date: Hyperlipidemia No date: Hypertension No date: IBS (irritable bowel syndrome) No date: Joint pain No date: Osteoporosis No date: Oxygen deficiency No date: Peripheral vascular disease (HCC) No date: PONV (postoperative nausea and vomiting) No date: Urge incontinence Past Surgical History: No date: ABDOMINAL HYSTERECTOMY No date: avascular necrosis 12/01/2015: CARDIAC CATHETERIZATION; Left     Comment:  Procedure: Left Heart Cath and Coronary Angiography;                Surgeon: Teodoro Spray, MD;  Location: [facility]               [facility];  Service: Cardiovascular;  Laterality: Left; No date: eyelid lift No date: HERNIA REPAIR No date: JOINT REPLACEMENT     Comment:  hip No date: NASAL SINUS SURGERY 01/21/2017: TOTAL HIP REVISION; Left     Comment:  Procedure: TOTAL HIP REVISION FEMORAL STEM WITH               POLYETHYLENE EXCHANGE WITH CUPLINER;  Surgeon: Hessie Knows, MD;  Location: [facility];  Service: Orthopedics;  Laterality: Left; BMI    Body Mass Index:  26.18 kg/m     Reproductive/Obstetrics negative OB ROS                            Anesthesia Physical  Anesthesia Plan  ASA: III  Anesthesia Plan: General   Post-op Pain Management:    Induction: Intravenous  PONV Risk Score and Plan: 4 or greater and Ondansetron, Propofol infusion and TIVA  Airway Management Planned: Nasal  Cannula and Natural Airway  Additional Equipment: None  Intra-op Plan:   Post-operative Plan:   Informed Consent: I have reviewed the patients History and Physical, chart, labs and discussed the procedure including the risks, benefits and alternatives for the proposed anesthesia with the patient or authorized representative who has indicated his/her understanding and acceptance.     Dental Advisory Given  Plan Discussed with: CRNA  Anesthesia Plan Comments: (Discussed risks of anesthesia with patient, including possibility of difficulty with spontaneous ventilation under anesthesia necessitating airway intervention, PONV, and rare risks such as cardiac or respiratory or neurological events, aspiration. Patient understands.)       Anesthesia Quick Evaluation

## 2019-11-08 NOTE — Anesthesia Postprocedure Evaluation (Signed)
Anesthesia Post Note  Patient: Amy Rocha  Procedure(s) Performed: ESOPHAGOGASTRODUODENOSCOPY (EGD) WITH PROPOFOL (N/A )  Patient location during evaluation: Endoscopy Anesthesia Type: General Level of consciousness: awake and alert Pain management: pain level controlled Vital Signs Assessment: post-procedure vital signs reviewed and stable Respiratory status: spontaneous breathing, nonlabored ventilation, respiratory function stable and patient connected to nasal cannula oxygen Cardiovascular status: blood pressure returned to baseline and stable Postop Assessment: no apparent nausea or vomiting Anesthetic complications: no   No complications documented.   Last Vitals:  Vitals:   11/08/19 0947 11/08/19 0949  BP: 112/86 118/78  Pulse: 97 91  Resp: 16 19  Temp:    SpO2: 98% 98%    Last Pain:  Vitals:   11/08/19 0856  TempSrc: Tympanic  PainSc: 5                  Arita Miss

## 2019-11-08 NOTE — Progress Notes (Signed)
While rounding the unit, chaplain briefly visited with pt and her daughter. Pt said all is well and she doesn't have any questions or concerns.

## 2019-11-09 LAB — SURGICAL PATHOLOGY

## 2019-11-15 ENCOUNTER — Encounter: Payer: Self-pay | Admitting: Gastroenterology

## 2019-11-16 ENCOUNTER — Other Ambulatory Visit: Payer: Self-pay | Admitting: Family Medicine

## 2019-11-16 ENCOUNTER — Ambulatory Visit: Payer: Medicare Other | Admitting: Nurse Practitioner

## 2019-11-16 ENCOUNTER — Ambulatory Visit: Payer: Medicare Other | Admitting: Family Medicine

## 2019-11-22 ENCOUNTER — Other Ambulatory Visit: Payer: Self-pay | Admitting: Family Medicine

## 2019-11-30 DIAGNOSIS — M259 Joint disorder, unspecified: Secondary | ICD-10-CM | POA: Diagnosis not present

## 2019-11-30 DIAGNOSIS — G894 Chronic pain syndrome: Secondary | ICD-10-CM | POA: Diagnosis not present

## 2019-11-30 DIAGNOSIS — M5136 Other intervertebral disc degeneration, lumbar region: Secondary | ICD-10-CM | POA: Diagnosis not present

## 2019-12-02 DIAGNOSIS — G894 Chronic pain syndrome: Secondary | ICD-10-CM | POA: Diagnosis not present

## 2019-12-06 ENCOUNTER — Ambulatory Visit: Payer: Medicare Other | Attending: Internal Medicine

## 2019-12-06 DIAGNOSIS — Z23 Encounter for immunization: Secondary | ICD-10-CM

## 2019-12-06 NOTE — Progress Notes (Signed)
   Covid-19 Vaccination Clinic  Name:  Amy Rocha    MRN: [id number] DOB: 1955-03-18  12/06/2019  [name] was observed post Covid-19 immunization for 15 minutes without incident. She was provided with Vaccine Information Sheet and instruction to access the V-Safe system.   [name] was instructed to call 911 with any severe reactions post vaccine: Marland Kitchen Difficulty breathing  . Swelling of face and throat  . A fast heartbeat  . A bad rash all over body  . Dizziness and weakness

## 2019-12-16 ENCOUNTER — Ambulatory Visit: Payer: Medicare Other | Admitting: Family Medicine

## 2019-12-21 ENCOUNTER — Encounter: Payer: Self-pay | Admitting: Family Medicine

## 2019-12-21 ENCOUNTER — Ambulatory Visit (INDEPENDENT_AMBULATORY_CARE_PROVIDER_SITE_OTHER): Payer: Medicare Other | Admitting: Family Medicine

## 2019-12-21 ENCOUNTER — Other Ambulatory Visit: Payer: Self-pay

## 2019-12-21 VITALS — BP 145/86 | HR 75 | Temp 97.9°F | Ht 64.0 in | Wt 153.0 lb

## 2019-12-21 DIAGNOSIS — K219 Gastro-esophageal reflux disease without esophagitis: Secondary | ICD-10-CM

## 2019-12-21 DIAGNOSIS — D649 Anemia, unspecified: Secondary | ICD-10-CM

## 2019-12-21 DIAGNOSIS — I10 Essential (primary) hypertension: Secondary | ICD-10-CM | POA: Diagnosis not present

## 2019-12-21 DIAGNOSIS — E782 Mixed hyperlipidemia: Secondary | ICD-10-CM

## 2019-12-21 DIAGNOSIS — N184 Chronic kidney disease, stage 4 (severe): Secondary | ICD-10-CM

## 2019-12-21 DIAGNOSIS — F331 Major depressive disorder, recurrent, moderate: Secondary | ICD-10-CM

## 2019-12-21 DIAGNOSIS — I129 Hypertensive chronic kidney disease with stage 1 through stage 4 chronic kidney disease, or unspecified chronic kidney disease: Secondary | ICD-10-CM

## 2019-12-21 LAB — URINALYSIS, ROUTINE W REFLEX MICROSCOPIC
Bilirubin, UA: NEGATIVE
Glucose, UA: NEGATIVE
Ketones, UA: NEGATIVE
Nitrite, UA: NEGATIVE
Protein,UA: NEGATIVE
RBC, UA: NEGATIVE
Specific Gravity, UA: 1.01 (ref 1.005–1.030)
Urobilinogen, Ur: 0.2 mg/dL (ref 0.2–1.0)
pH, UA: 6 (ref 5.0–7.5)

## 2019-12-21 LAB — MICROALBUMIN, URINE WAIVED
Creatinine, Urine Waived: 10 mg/dL (ref 10–300)
Microalb, Ur Waived: 10 mg/L (ref 0–19)
Microalb/Creat Ratio: <30 mg/g (ref <30)

## 2019-12-21 LAB — MICROSCOPIC EXAMINATION
Bacteria, UA: NONE SEEN
RBC, Urine: NONE SEEN /hpf (ref 0–2)

## 2019-12-21 MED ORDER — LEVOCETIRIZINE DIHYDROCHLORIDE 5 MG PO TABS: 5.0000 mg | ORAL_TABLET | Freq: Every evening | ORAL | 3 refills | Status: DC

## 2019-12-21 MED ORDER — OMEPRAZOLE 20 MG PO CPDR
20.0000 mg | DELAYED_RELEASE_CAPSULE | Freq: Every day | ORAL | 1 refills | Status: DC
Start: 2019-12-21 — End: 2020-08-10

## 2019-12-21 MED ORDER — ROPINIROLE HCL 0.5 MG PO TABS: 0.5000 mg | ORAL_TABLET | Freq: Every evening | ORAL | 1 refills | Status: AC | PRN

## 2019-12-21 MED ORDER — ATORVASTATIN CALCIUM 40 MG PO TABS
40.0000 mg | ORAL_TABLET | Freq: Every day | ORAL | 1 refills | Status: DC
Start: 2019-12-21 — End: 2020-08-23

## 2019-12-21 MED ORDER — BUPROPION HCL ER (XL) 300 MG PO TB24
300.0000 mg | ORAL_TABLET | Freq: Every morning | ORAL | 1 refills | Status: DC
Start: 2019-12-21 — End: 2020-06-22

## 2019-12-21 MED ORDER — BUDESONIDE-FORMOTEROL FUMARATE 160-4.5 MCG/ACT IN AERO
2.0000 | INHALATION_SPRAY | Freq: Two times a day (BID) | RESPIRATORY_TRACT | 6 refills | Status: DC | PRN
Start: 2019-12-21 — End: 2021-10-10

## 2019-12-21 MED ORDER — LOSARTAN POTASSIUM 25 MG PO TABS: 25.0000 mg | ORAL_TABLET | Freq: Every day | ORAL | 3 refills | Status: DC

## 2019-12-21 MED ORDER — VORTIOXETINE HBR 20 MG PO TABS
ORAL_TABLET | ORAL | 1 refills | Status: DC
Start: 2019-12-21 — End: 2020-09-26

## 2019-12-21 MED ORDER — ALBUTEROL SULFATE HFA 108 (90 BASE) MCG/ACT IN AERS: 2.0000 | INHALATION_SPRAY | Freq: Four times a day (QID) | RESPIRATORY_TRACT | 3 refills | Status: DC | PRN

## 2019-12-21 MED ORDER — MONTELUKAST SODIUM 10 MG PO TABS
10.0000 mg | ORAL_TABLET | Freq: Every day | ORAL | 1 refills | Status: DC
Start: 2019-12-21 — End: 2020-09-07

## 2019-12-21 MED ORDER — FLUTICASONE PROPIONATE 50 MCG/ACT NA SUSP
2.0000 | Freq: Every day | NASAL | 6 refills | Status: DC
Start: 2019-12-21 — End: 2022-07-17

## 2019-12-21 NOTE — Progress Notes (Signed)
BP (!) 145/86 (BP Location: Left Arm, Patient Position: Sitting)   Pulse 75   Temp 97.9 F (36.6 C) (Oral)   Ht 5\' 4"  (1.626 m)   Wt 153 lb (69.4 kg)   LMP 09/13/1975 (Approximate)   SpO2 96%   BMI 26.26 kg/m    Subjective:    Patient ID: Amy Rocha, female    DOB: Mar 11, 1955, 64 y.o.   MRN: [id number]  HPI: Amy Rocha is a 64 y.o. female  Chief Complaint  Patient presents with  . sleep problems    f/u  . Hypertension  . Hyperlipidemia   INSOMNIA Duration: chronic Satisfied with sleep quality: no Difficulty falling asleep: yes Difficulty staying asleep: no Waking a few hours after sleep onset: no Early morning awakenings: no Daytime hypersomnolence: no Wakes feeling refreshed: no Good sleep hygiene: yes Apnea: no Snoring: no Depressed/anxious mood: yes Recent stress: no Restless legs/nocturnal leg cramps: yes Chronic pain/arthritis: yes History of sleep study: no Treatments attempted: amitriptyline, melatonin, uinsom, benadryl and ambien   COPD COPD status: controlled Satisfied with current treatment?: yes Oxygen use: no Dyspnea frequency: occasionally Cough frequency: occasionally Rescue inhaler frequency:  rarely Limitation of activity: yes Productive cough: no Pneumovax: Up to Date Influenza: Up to Date  DEPRESSION Mood status: uncontrolled Satisfied with current treatment?: no Symptom severity: moderate  Duration of current treatment : chronic Side effects: no Medication compliance: good compliance Psychotherapy/counseling: no  Depressed mood: yes Anxious mood: yes Anhedonia: no Significant weight loss or gain: no Insomnia: yes  Fatigue: yes Feelings of worthlessness or guilt: no Impaired concentration/indecisiveness: no Suicidal ideations: no Hopelessness: no Crying spells: no Depression screen [facility] 2/9 12/21/2019 08/13/2019 06/23/2019 05/05/2019 09/24/2018  Decreased Interest 0 1 1 1 1   Down, Depressed, Hopeless 1 0 0 0 0  PHQ -  2 Score 1 1 1 1 1   Altered sleeping 1 2 - 0 0  Tired, decreased energy 1 2 - 1 1  Change in appetite 0 0 - 0 0  Feeling bad or failure about yourself  1 0 - 0 0  Trouble concentrating 1 0 - 0 0  Moving slowly or fidgety/restless 0 0 - 0 0  Suicidal thoughts 0 0 - 0 0  PHQ-9 Score 5 5 - 2 2  Difficult doing work/chores Not difficult at all - - - Not difficult at all  Some recent data might be hidden   HYPERTENSION / HYPERLIPIDEMIA Satisfied with current treatment? yes Duration of hypertension: chronic BP monitoring frequency: not checking BP medication side effects: not on anything currently Past BP meds: metoprolol, amlodipine Duration of hyperlipidemia: chronic Cholesterol medication side effects: no Cholesterol supplements: none Past cholesterol medications: atorvastatin Medication compliance: excellent compliance Aspirin: no Recent stressors: no Recurrent headaches: no Visual changes: no Palpitations: no Dyspnea: no Chest pain: no Lower extremity edema: no Dizzy/lightheaded: no  Relevant past medical, surgical, family and social history reviewed and updated as indicated. Interim medical history since our last visit reviewed. Allergies and medications reviewed and updated.  Review of Systems  Constitutional: Negative.   Respiratory: Negative.   Cardiovascular: Negative.   Gastrointestinal: Negative.   Musculoskeletal: Negative.   Skin: Negative.   Neurological: Negative.   Psychiatric/Behavioral: Positive for decreased concentration, dysphoric mood and sleep disturbance. Negative for agitation, behavioral problems, confusion, hallucinations, self-injury and suicidal ideas. The patient is nervous/anxious. The patient is not hyperactive.     Per HPI unless specifically indicated above     Objective:  BP (!) 145/86 (BP Location: Left Arm, Patient Position: Sitting)   Pulse 75   Temp 97.9 F (36.6 C) (Oral)   Ht 5\' 4"  (1.626 m)   Wt 153 lb (69.4 kg)   LMP  09/13/1975 (Approximate)   SpO2 96%   BMI 26.26 kg/m   Wt Readings from Last 3 Encounters:  12/21/19 153 lb (69.4 kg)  11/08/19 145 lb (65.8 kg)  10/28/19 148 lb (67.1 kg)    Physical Exam Vitals and nursing note reviewed.  Constitutional:      General: She is not in acute distress.    Appearance: Normal appearance. She is not ill-appearing, toxic-appearing or diaphoretic.  HENT:     Head: Normocephalic and atraumatic.     Right Ear: External ear normal.     Left Ear: External ear normal.     Nose: Nose normal.     Mouth/Throat:     Mouth: Mucous membranes are moist.     Pharynx: Oropharynx is clear.  Eyes:     General: No scleral icterus.       Right eye: No discharge.        Left eye: No discharge.     Extraocular Movements: Extraocular movements intact.     Conjunctiva/sclera: Conjunctivae normal.     Pupils: Pupils are equal, round, and reactive to light.  Cardiovascular:     Rate and Rhythm: Normal rate and regular rhythm.     Pulses: Normal pulses.     Heart sounds: Normal heart sounds. No murmur heard.  No friction rub. No gallop.   Pulmonary:     Effort: Pulmonary effort is normal. No respiratory distress.     Breath sounds: Normal breath sounds. No stridor. No wheezing, rhonchi or rales.  Chest:     Chest wall: No tenderness.  Musculoskeletal:        General: Normal range of motion.     Cervical back: Normal range of motion and neck supple.  Skin:    General: Skin is warm and dry.     Capillary Refill: Capillary refill takes less than 2 seconds.     Coloration: Skin is not jaundiced or pale.     Findings: No bruising, erythema, lesion or rash.  Neurological:     General: No focal deficit present.     Mental Status: She is alert and oriented to person, place, and time. Mental status is at baseline.  Psychiatric:        Mood and Affect: Mood normal.        Behavior: Behavior normal.        Thought Content: Thought content normal.        Judgment: Judgment  normal.     Results for orders placed or performed in visit on 12/21/19  Microscopic Examination   Urine  Result Value Ref Range   WBC, UA 0-5 0 - 5 /hpf   RBC None seen 0 - 2 /hpf   Epithelial Cells (non renal) 0-10 0 - 10 /hpf   Bacteria, UA None seen None seen/Few  CBC with Differential/Platelet  Result Value Ref Range   WBC 8.2 3.4 - 10.8 x10E3/uL   RBC 4.58 3.77 - 5.28 x10E6/uL   Hemoglobin 14.5 11.1 - 15.9 g/dL   Hematocrit 41.7 34.0 - 46.6 %   MCV 91 79 - 97 fL   MCH 31.7 26.6 - 33.0 pg   MCHC 34.8 31 - 35 g/dL   RDW 13.0 11.7 - 15.4 %  Platelets 358 150 - 450 x10E3/uL   Neutrophils 60 Not Estab. %   Lymphs 28 Not Estab. %   Monocytes 8 Not Estab. %   Eos 3 Not Estab. %   Basos 1 Not Estab. %   Neutrophils Absolute 4.9 1.40 - 7.00 x10E3/uL   Lymphocytes Absolute 2.3 0 - 3 x10E3/uL   Monocytes Absolute 0.6 0 - 0 x10E3/uL   EOS (ABSOLUTE) 0.3 0.0 - 0.4 x10E3/uL   Basophils Absolute 0.1 0 - 0 x10E3/uL   Immature Granulocytes 0 Not Estab. %   Immature Grans (Abs) 0.0 0.0 - 0.1 x10E3/uL  Comprehensive metabolic panel  Result Value Ref Range   Glucose 80 65 - 99 mg/dL   BUN 13 8 - 27 mg/dL   Creatinine, Ser 0.96 0.57 - 1.00 mg/dL   GFR calc non Af Amer 62 >59 mL/min/1.73   GFR calc Af Amer 71 >59 mL/min/1.73   BUN/Creatinine Ratio 14 12 - 28   Sodium 140 134 - 144 mmol/L   Potassium 4.4 3.5 - 5.2 mmol/L   Chloride 105 96 - 106 mmol/L   CO2 23 20 - 29 mmol/L   Calcium 9.0 8.7 - 10.3 mg/dL   Total Protein 6.2 6.0 - 8.5 g/dL   Albumin 3.9 3.8 - 4.8 g/dL   Globulin, Total 2.3 1.5 - 4.5 g/dL   Albumin/Globulin Ratio 1.7 1.2 - 2.2   Bilirubin Total 0.4 0.0 - 1.2 mg/dL   Alkaline Phosphatase 97 44 - 121 IU/L   AST 40 0 - 40 IU/L   ALT 52 (H) 0 - 32 IU/L  Lipid Panel w/o Chol/HDL Ratio  Result Value Ref Range   Cholesterol, Total 179 100 - 199 mg/dL   Triglycerides 105 0 - 149 mg/dL   HDL 63 >39 mg/dL   VLDL Cholesterol Cal 19 5 - 40 mg/dL   LDL Chol Calc (NIH)  97 0 - 99 mg/dL  Microalbumin, Urine Waived  Result Value Ref Range   Microalb, Ur Waived 10 0 - 19 mg/L   Creatinine, Urine Waived 10 10 - 300 mg/dL   Microalb/Creat Ratio <30 <30 mg/g  TSH  Result Value Ref Range   TSH 1.680 0.450 - 4.500 uIU/mL  Urinalysis, Routine w reflex microscopic  Result Value Ref Range   Specific Gravity, UA 1.010 1.005 - 1.030   pH, UA 6.0 5.0 - 7.5   Color, UA Yellow Yellow   Appearance Ur Clear Clear   Leukocytes,UA Trace (A) Negative   Protein,UA Negative Negative/Trace   Glucose, UA Negative Negative   Ketones, UA Negative Negative   RBC, UA Negative Negative   Bilirubin, UA Negative Negative   Urobilinogen, Ur 0.2 0.2 - 1.0 mg/dL   Nitrite, UA Negative Negative   Microscopic Examination See below:       Assessment & Plan:   Problem List Items Addressed This Visit      Cardiovascular and Mediastinum   Hypertension - Primary    Not under good control. Will add losartan and recheck 1 month. Call with any concerns.       Relevant Medications   atorvastatin (LIPITOR) 40 MG tablet   losartan (COZAAR) 25 MG tablet   Other Relevant Orders   Comprehensive metabolic panel (Completed)   Microalbumin, Urine Waived (Completed)   Urinalysis, Routine w reflex microscopic (Completed)     Digestive   GERD (gastroesophageal reflux disease)    Under good control on current regimen. Continue current regimen. Continue to monitor. Call with  any concerns. Refills given. Labs drawn today.       Relevant Medications   omeprazole (PRILOSEC) 20 MG capsule   Other Relevant Orders   CBC with Differential/Platelet (Completed)   Comprehensive metabolic panel (Completed)     Genitourinary   Hypertensive CKD (chronic kidney disease)    Under good control on current regimen. Continue current regimen. Continue to monitor. Call with any concerns. Refills given. Labs drawn today.       CKD (chronic kidney disease), stage IV ([place])    Labs drawn today. Await  results. Treat as needed.       Relevant Orders   CBC with Differential/Platelet (Completed)   Comprehensive metabolic panel (Completed)   Microalbumin, Urine Waived (Completed)   Urinalysis, Routine w reflex microscopic (Completed)     Other   Anemia    Rechecking labs today. Await results. Treat as needed.       Relevant Orders   CBC with Differential/Platelet (Completed)   Comprehensive metabolic panel (Completed)   Hyperlipidemia    Under good control on current regimen. Continue current regimen. Continue to monitor. Call with any concerns. Refills given. Labs drawn today.         Relevant Medications   atorvastatin (LIPITOR) 40 MG tablet   losartan (COZAAR) 25 MG tablet   Other Relevant Orders   Comprehensive metabolic panel (Completed)   Lipid Panel w/o Chol/HDL Ratio (Completed)   Depression    Not doing great. Will increase her wellbutrin to 300mg  and have her start taking it in the AM. Recheck 1 month. Callwith any concerns      Relevant Medications   amitriptyline (ELAVIL) 10 MG tablet   vortioxetine HBr (TRINTELLIX) 20 MG TABS tablet   buPROPion (WELLBUTRIN XL) 300 MG 24 hr tablet   Other Relevant Orders   Comprehensive metabolic panel (Completed)   TSH (Completed)       Follow up plan: Return in about 4 weeks (around 01/18/2020) for follow up BP and mood.

## 2019-12-22 LAB — COMPREHENSIVE METABOLIC PANEL
ALT: 52 IU/L — ABNORMAL HIGH (ref 0–32)
AST: 40 IU/L (ref 0–40)
Albumin/Globulin Ratio: 1.7 (ref 1.2–2.2)
Albumin: 3.9 g/dL (ref 3.8–4.8)
Alkaline Phosphatase: 97 IU/L (ref 44–121)
BUN/Creatinine Ratio: 14 (ref 12–28)
BUN: 13 mg/dL (ref 8–27)
Bilirubin Total: 0.4 mg/dL (ref 0.0–1.2)
CO2: 23 mmol/L (ref 20–29)
Calcium: 9 mg/dL (ref 8.7–10.3)
Chloride: 105 mmol/L (ref 96–106)
Creatinine, Ser: 0.96 mg/dL (ref 0.57–1.00)
GFR calc Af Amer: 71 mL/min/{1.73_m2} (ref >59)
GFR calc non Af Amer: 62 mL/min/{1.73_m2} (ref >59)
Globulin, Total: 2.3 g/dL (ref 1.5–4.5)
Glucose: 80 mg/dL (ref 65–99)
Potassium: 4.4 mmol/L (ref 3.5–5.2)
Sodium: 140 mmol/L (ref 134–144)
Total Protein: 6.2 g/dL (ref 6.0–8.5)

## 2019-12-22 LAB — CBC WITH DIFFERENTIAL/PLATELET
Basophils Absolute: 0.1 10*3/uL (ref 0.0–0.2)
Basos: 1 %
EOS (ABSOLUTE): 0.3 10*3/uL (ref 0.0–0.4)
Eos: 3 %
Hematocrit: 41.7 % (ref 34.0–46.6)
Hemoglobin: 14.5 g/dL (ref 11.1–15.9)
Immature Grans (Abs): 0 10*3/uL (ref 0.0–0.1)
Immature Granulocytes: 0 %
Lymphocytes Absolute: 2.3 10*3/uL (ref 0.7–3.1)
Lymphs: 28 %
MCH: 31.7 pg (ref 26.6–33.0)
MCHC: 34.8 g/dL (ref 31.5–35.7)
MCV: 91 fL (ref 79–97)
Monocytes Absolute: 0.6 10*3/uL (ref 0.1–0.9)
Monocytes: 8 %
Neutrophils Absolute: 4.9 10*3/uL (ref 1.4–7.0)
Neutrophils: 60 %
Platelets: 358 10*3/uL (ref 150–450)
RBC: 4.58 x10E6/uL (ref 3.77–5.28)
RDW: 13 % (ref 11.7–15.4)
WBC: 8.2 10*3/uL (ref 3.4–10.8)

## 2019-12-22 LAB — LIPID PANEL W/O CHOL/HDL RATIO
Cholesterol, Total: 179 mg/dL (ref 100–199)
HDL: 63 mg/dL (ref >39)
LDL Chol Calc (NIH): 97 mg/dL (ref 0–99)
Triglycerides: 105 mg/dL (ref 0–149)
VLDL Cholesterol Cal: 19 mg/dL (ref 5–40)

## 2019-12-22 LAB — TSH: TSH: 1.68 u[IU]/mL (ref 0.450–4.500)

## 2019-12-22 NOTE — Assessment & Plan Note (Signed)
Not under good control. Will add losartan and recheck 1 month. Call with any concerns.

## 2019-12-22 NOTE — Assessment & Plan Note (Signed)
Under good control on current regimen. Continue current regimen. Continue to monitor. Call with any concerns. Refills given. Labs drawn today.   

## 2019-12-22 NOTE — Assessment & Plan Note (Signed)
Labs drawn today. Await results. Treat as needed.  

## 2019-12-22 NOTE — Assessment & Plan Note (Signed)
Not doing great. Will increase her wellbutrin to 300mg  and have her start taking it in the AM. Recheck 1 month. Callwith any concerns

## 2019-12-22 NOTE — Assessment & Plan Note (Signed)
Rechecking labs today. Await results. Treat as needed.  °

## 2019-12-28 DIAGNOSIS — M259 Joint disorder, unspecified: Secondary | ICD-10-CM | POA: Diagnosis not present

## 2019-12-28 DIAGNOSIS — G894 Chronic pain syndrome: Secondary | ICD-10-CM | POA: Diagnosis not present

## 2019-12-28 DIAGNOSIS — M9904 Segmental and somatic dysfunction of sacral region: Secondary | ICD-10-CM | POA: Diagnosis not present

## 2019-12-28 DIAGNOSIS — M5136 Other intervertebral disc degeneration, lumbar region: Secondary | ICD-10-CM | POA: Diagnosis not present

## 2020-01-01 DIAGNOSIS — G894 Chronic pain syndrome: Secondary | ICD-10-CM | POA: Diagnosis not present

## 2020-01-04 ENCOUNTER — Ambulatory Visit: Payer: Medicare Other | Admitting: Gastroenterology

## 2020-01-20 ENCOUNTER — Ambulatory Visit (INDEPENDENT_AMBULATORY_CARE_PROVIDER_SITE_OTHER): Payer: Medicare Other | Admitting: Family Medicine

## 2020-01-20 ENCOUNTER — Other Ambulatory Visit: Payer: Self-pay

## 2020-01-20 ENCOUNTER — Encounter: Payer: Self-pay | Admitting: Family Medicine

## 2020-01-20 VITALS — BP 127/84 | HR 82 | Temp 97.9°F | Ht 63.94 in | Wt 152.6 lb

## 2020-01-20 DIAGNOSIS — I129 Hypertensive chronic kidney disease with stage 1 through stage 4 chronic kidney disease, or unspecified chronic kidney disease: Secondary | ICD-10-CM

## 2020-01-20 DIAGNOSIS — F331 Major depressive disorder, recurrent, moderate: Secondary | ICD-10-CM

## 2020-01-20 MED ORDER — LOSARTAN POTASSIUM 25 MG PO TABS
25.0000 mg | ORAL_TABLET | Freq: Every day | ORAL | 1 refills | Status: DC
Start: 2020-01-20 — End: 2020-08-23

## 2020-01-20 MED ORDER — ARIPIPRAZOLE 2 MG PO TABS: 2.0000 mg | ORAL_TABLET | Freq: Every day | ORAL | 3 refills | Status: DC

## 2020-01-20 NOTE — Assessment & Plan Note (Signed)
Still not doing great. Will start her on abilify and continue trintellix and wellbutrin. Recheck 1 month. Call with any concerns.

## 2020-01-20 NOTE — Progress Notes (Signed)
BP 127/84   Pulse 82   Temp 97.9 F (36.6 C) (Oral)   Ht 5' 3.94" (1.624 m)   Wt 152 lb 9.6 oz (69.2 kg)   LMP 09/13/1975 (Approximate)   SpO2 98%   BMI 26.25 kg/m    Subjective:    Patient ID: Amy Rocha, female    DOB: [birth year], 64 y.o.   MRN: [id number]  HPI: Amy Rocha is a 64 y.o. female  Chief Complaint  Patient presents with  . Hypertension  . Mood   HYPERTENSION Hypertension status: better  Satisfied with current treatment? yes Duration of hypertension: chronic BP monitoring frequency:  not checking BP medication side effects:  no Medication compliance: excellent compliance Aspirin: yes Recurrent headaches: no Visual changes: no Palpitations: no Dyspnea: no Chest pain: no Lower extremity edema: no Dizzy/lightheaded: no  DEPRESSION Mood status:  A little bit better, but still not good Satisfied with current treatment?: no Symptom severity: mild  Duration of current treatment : chronic Side effects: no Medication compliance: excellent compliance Psychotherapy/counseling: no  Previous psychiatric medications: wellbutrin, trintellix Depressed mood: yes Anxious mood: yes Anhedonia: no Significant weight loss or gain: no Insomnia: no  Fatigue: yes Feelings of worthlessness or guilt: no Impaired concentration/indecisiveness: no Suicidal ideations: no Hopelessness: no Crying spells: no Depression screen [facility] 2/9 01/20/2020 12/21/2019 08/13/2019 06/23/2019 05/05/2019  Decreased Interest 1 0 1 1 1   Down, Depressed, Hopeless 1 1 0 0 0  PHQ - 2 Score 2 1 1 1 1   Altered sleeping 1 1 2  - 0  Tired, decreased energy 1 1 2  - 1  Change in appetite 0 0 0 - 0  Feeling bad or failure about yourself  0 1 0 - 0  Trouble concentrating 0 1 0 - 0  Moving slowly or fidgety/restless 0 0 0 - 0  Suicidal thoughts 0 0 0 - 0  PHQ-9 Score 4 5 5  - 2  Difficult doing work/chores - Not difficult at all - - -  Some recent data might be hidden   GAD 7 : Generalized  Anxiety Score 01/20/2020 12/21/2019 05/05/2019  Nervous, Anxious, on Edge 1 1 0  Control/stop worrying 0 1 1  Worry too much - different things 1 1 1   Trouble relaxing 1 0 0  Restless 0 0 0  Easily annoyed or irritable - 0 0  Afraid - awful might happen 0 0 0  Total GAD 7 Score - 3 2  Anxiety Difficulty - Not difficult at all Not difficult at all     Relevant past medical, surgical, family and social history reviewed and updated as indicated. Interim medical history since our last visit reviewed. Allergies and medications reviewed and updated.  Review of Systems  Constitutional: Negative.   Respiratory: Negative.   Cardiovascular: Negative.   Gastrointestinal: Negative.   Musculoskeletal: Negative.   Skin: Negative.   Neurological: Negative.   Psychiatric/Behavioral: Positive for dysphoric mood. Negative for agitation, behavioral problems, confusion, decreased concentration, hallucinations, self-injury, sleep disturbance and suicidal ideas. The patient is nervous/anxious. The patient is not hyperactive.     Per HPI unless specifically indicated above     Objective:    BP 127/84   Pulse 82   Temp 97.9 F (36.6 C) (Oral)   Ht 5' 3.94" (1.624 m)   Wt 152 lb 9.6 oz (69.2 kg)   LMP 09/13/1975 (Approximate)   SpO2 98%   BMI 26.25 kg/m   Wt Readings from Last 3 Encounters:  01/20/20 152 lb 9.6 oz (69.2 kg)  12/21/19 153 lb (69.4 kg)  11/08/19 145 lb (65.8 kg)    Physical Exam Vitals and nursing note reviewed.  Constitutional:      General: She is not in acute distress.    Appearance: Normal appearance. She is not ill-appearing, toxic-appearing or diaphoretic.  HENT:     Head: Normocephalic and atraumatic.     Right Ear: External ear normal.     Left Ear: External ear normal.     Nose: Nose normal.     Mouth/Throat:     Mouth: Mucous membranes are moist.     Pharynx: Oropharynx is clear.  Eyes:     General: No scleral icterus.       Right eye: No discharge.         Left eye: No discharge.     Extraocular Movements: Extraocular movements intact.     Conjunctiva/sclera: Conjunctivae normal.     Pupils: Pupils are equal, round, and reactive to light.  Cardiovascular:     Rate and Rhythm: Normal rate and regular rhythm.     Pulses: Normal pulses.     Heart sounds: Normal heart sounds. No murmur heard.  No friction rub. No gallop.   Pulmonary:     Effort: Pulmonary effort is normal. No respiratory distress.     Breath sounds: Normal breath sounds. No stridor. No wheezing, rhonchi or rales.  Chest:     Chest wall: No tenderness.  Musculoskeletal:        General: Normal range of motion.     Cervical back: Normal range of motion and neck supple.  Skin:    General: Skin is warm and dry.     Capillary Refill: Capillary refill takes less than 2 seconds.     Coloration: Skin is not jaundiced or pale.     Findings: No bruising, erythema, lesion or rash.  Neurological:     General: No focal deficit present.     Mental Status: She is alert and oriented to person, place, and time. Mental status is at baseline.  Psychiatric:        Mood and Affect: Mood normal.        Behavior: Behavior normal.        Thought Content: Thought content normal.        Judgment: Judgment normal.     Results for orders placed or performed in visit on 12/21/19  Microscopic Examination   Urine  Result Value Ref Range   WBC, UA 0-5 0 - 5 /hpf   RBC None seen 0 - 2 /hpf   Epithelial Cells (non renal) 0-10 0 - 10 /hpf   Bacteria, UA None seen None seen/Few  CBC with Differential/Platelet  Result Value Ref Range   WBC 8.2 3.4 - 10.8 x10E3/uL   RBC 4.58 3.77 - 5.28 x10E6/uL   Hemoglobin 14.5 11.1 - 15.9 g/dL   Hematocrit 41.7 34.0 - 46.6 %   MCV 91 79 - 97 fL   MCH 31.7 26.6 - 33.0 pg   MCHC 34.8 31 - 35 g/dL   RDW 13.0 11.7 - 15.4 %   Platelets 358 150 - 450 x10E3/uL   Neutrophils 60 Not Estab. %   Lymphs 28 Not Estab. %   Monocytes 8 Not Estab. %   Eos 3 Not Estab.  %   Basos 1 Not Estab. %   Neutrophils Absolute 4.9 1.40 - 7.00 x10E3/uL   Lymphocytes Absolute 2.3 0 -  3 x10E3/uL   Monocytes Absolute 0.6 0 - 0 x10E3/uL   EOS (ABSOLUTE) 0.3 0.0 - 0.4 x10E3/uL   Basophils Absolute 0.1 0 - 0 x10E3/uL   Immature Granulocytes 0 Not Estab. %   Immature Grans (Abs) 0.0 0.0 - 0.1 x10E3/uL  Comprehensive metabolic panel  Result Value Ref Range   Glucose 80 65 - 99 mg/dL   BUN 13 8 - 27 mg/dL   Creatinine, Ser 0.96 0.57 - 1.00 mg/dL   GFR calc non Af Amer 62 >59 mL/min/1.73   GFR calc Af Amer 71 >59 mL/min/1.73   BUN/Creatinine Ratio 14 12 - 28   Sodium 140 134 - 144 mmol/L   Potassium 4.4 3.5 - 5.2 mmol/L   Chloride 105 96 - 106 mmol/L   CO2 23 20 - 29 mmol/L   Calcium 9.0 8.7 - 10.3 mg/dL   Total Protein 6.2 6.0 - 8.5 g/dL   Albumin 3.9 3.8 - 4.8 g/dL   Globulin, Total 2.3 1.5 - 4.5 g/dL   Albumin/Globulin Ratio 1.7 1.2 - 2.2   Bilirubin Total 0.4 0.0 - 1.2 mg/dL   Alkaline Phosphatase 97 44 - 121 IU/L   AST 40 0 - 40 IU/L   ALT 52 (H) 0 - 32 IU/L  Lipid Panel w/o Chol/HDL Ratio  Result Value Ref Range   Cholesterol, Total 179 100 - 199 mg/dL   Triglycerides 105 0 - 149 mg/dL   HDL 63 >39 mg/dL   VLDL Cholesterol Cal 19 5 - 40 mg/dL   LDL Chol Calc (NIH) 97 0 - 99 mg/dL  Microalbumin, Urine Waived  Result Value Ref Range   Microalb, Ur Waived 10 0 - 19 mg/L   Creatinine, Urine Waived 10 10 - 300 mg/dL   Microalb/Creat Ratio <30 <30 mg/g  TSH  Result Value Ref Range   TSH 1.680 0.450 - 4.500 uIU/mL  Urinalysis, Routine w reflex microscopic  Result Value Ref Range   Specific Gravity, UA 1.010 1.005 - 1.030   pH, UA 6.0 5.0 - 7.5   Color, UA Yellow Yellow   Appearance Ur Clear Clear   Leukocytes,UA Trace (A) Negative   Protein,UA Negative Negative/Trace   Glucose, UA Negative Negative   Ketones, UA Negative Negative   RBC, UA Negative Negative   Bilirubin, UA Negative Negative   Urobilinogen, Ur 0.2 0.2 - 1.0 mg/dL   Nitrite, UA  Negative Negative   Microscopic Examination See below:       Assessment & Plan:   Problem List Items Addressed This Visit      Genitourinary   Hypertensive CKD (chronic kidney disease) - Primary    Under good control on current regimen. Continue current regimen. Continue to monitor. Call with any concerns. Refills given. Labs drawn today.       Relevant Orders   Basic metabolic panel     Other   Depression    Still not doing great. Will start her on abilify and continue trintellix and wellbutrin. Recheck 1 month. Call with any concerns.           Follow up plan: Return in about 4 weeks (around 02/17/2020).

## 2020-01-20 NOTE — Assessment & Plan Note (Signed)
Under good control on current regimen. Continue current regimen. Continue to monitor. Call with any concerns. Refills given. Labs drawn today.   

## 2020-01-21 LAB — BASIC METABOLIC PANEL
BUN/Creatinine Ratio: 7 — ABNORMAL LOW (ref 12–28)
BUN: 8 mg/dL (ref 8–27)
CO2: 19 mmol/L — ABNORMAL LOW (ref 20–29)
Calcium: 9.4 mg/dL (ref 8.7–10.3)
Chloride: 103 mmol/L (ref 96–106)
Creatinine, Ser: 1.17 mg/dL — ABNORMAL HIGH (ref 0.57–1.00)
GFR calc Af Amer: 56 mL/min/{1.73_m2} — ABNORMAL LOW (ref >59)
GFR calc non Af Amer: 49 mL/min/{1.73_m2} — ABNORMAL LOW (ref >59)
Glucose: 95 mg/dL (ref 65–99)
Potassium: 4.7 mmol/L (ref 3.5–5.2)
Sodium: 139 mmol/L (ref 134–144)

## 2020-01-25 DIAGNOSIS — M25559 Pain in unspecified hip: Secondary | ICD-10-CM | POA: Diagnosis not present

## 2020-01-25 DIAGNOSIS — M48062 Spinal stenosis, lumbar region with neurogenic claudication: Secondary | ICD-10-CM | POA: Diagnosis not present

## 2020-01-25 DIAGNOSIS — M79669 Pain in unspecified lower leg: Secondary | ICD-10-CM | POA: Diagnosis not present

## 2020-01-25 DIAGNOSIS — M792 Neuralgia and neuritis, unspecified: Secondary | ICD-10-CM | POA: Diagnosis not present

## 2020-01-25 DIAGNOSIS — M79609 Pain in unspecified limb: Secondary | ICD-10-CM | POA: Diagnosis not present

## 2020-01-25 DIAGNOSIS — R519 Headache, unspecified: Secondary | ICD-10-CM | POA: Diagnosis not present

## 2020-01-25 DIAGNOSIS — G8929 Other chronic pain: Secondary | ICD-10-CM | POA: Diagnosis not present

## 2020-01-26 DIAGNOSIS — M9904 Segmental and somatic dysfunction of sacral region: Secondary | ICD-10-CM | POA: Diagnosis not present

## 2020-01-26 DIAGNOSIS — M169 Osteoarthritis of hip, unspecified: Secondary | ICD-10-CM | POA: Diagnosis not present

## 2020-01-26 DIAGNOSIS — M5136 Other intervertebral disc degeneration, lumbar region: Secondary | ICD-10-CM | POA: Diagnosis not present

## 2020-01-26 DIAGNOSIS — M259 Joint disorder, unspecified: Secondary | ICD-10-CM | POA: Diagnosis not present

## 2020-02-17 ENCOUNTER — Telehealth: Payer: Medicare Other | Admitting: Family Medicine

## 2020-02-17 ENCOUNTER — Telehealth (INDEPENDENT_AMBULATORY_CARE_PROVIDER_SITE_OTHER): Payer: Medicare Other | Admitting: Family Medicine

## 2020-02-17 ENCOUNTER — Telehealth: Payer: Self-pay

## 2020-02-17 ENCOUNTER — Encounter: Payer: Self-pay | Admitting: Family Medicine

## 2020-02-17 VITALS — HR 93 | Temp 100.1°F

## 2020-02-17 DIAGNOSIS — J0101 Acute recurrent maxillary sinusitis: Secondary | ICD-10-CM

## 2020-02-17 DIAGNOSIS — Z20822 Contact with and (suspected) exposure to covid-19: Secondary | ICD-10-CM

## 2020-02-17 MED ORDER — FLUCONAZOLE 150 MG PO TABS: 150.0000 mg | ORAL_TABLET | Freq: Every day | ORAL | 0 refills | Status: DC

## 2020-02-17 MED ORDER — PREDNISONE 50 MG PO TABS: 50.0000 mg | ORAL_TABLET | Freq: Every day | ORAL | 0 refills | Status: DC

## 2020-02-17 MED ORDER — AMOXICILLIN-POT CLAVULANATE 875-125 MG PO TABS: 1.0000 | ORAL_TABLET | Freq: Two times a day (BID) | ORAL | 0 refills | Status: DC

## 2020-02-17 NOTE — Telephone Encounter (Signed)
Appt scheduled with Dr.Johnson.

## 2020-02-17 NOTE — Telephone Encounter (Signed)
Copied from [place] [id number]. Topic: General - Other >> Feb 17, 2020  8:47 AM Hinda Lenis D wrote: Pt requesting a urgent appt / sinus infection / no opening till Monday / please advise   No available apt for this week can pt be add on or scheduled for next week?

## 2020-02-17 NOTE — Progress Notes (Signed)
Pulse 93   Temp 100.1 F (37.8 C)   LMP 09/13/1975 (Approximate)   SpO2 98%    Subjective:    Patient ID: Amy Rocha, female    DOB: 07/12/1955, 64 y.o.   MRN: [id number]  HPI: Amy Rocha is a 64 y.o. female  Chief Complaint  Patient presents with  . Cough  . Fever    Pt has been sick since yesterday   . Headache   UPPER RESPIRATORY TRACT INFECTION Duration:  Worst symptom: fever, congestion Fever: yes Cough: yes Shortness of breath: yes Wheezing: no Chest pain: no Chest tightness: no Chest congestion: no Nasal congestion: yes Runny nose: yes Post nasal drip: yes Sneezing: no Sore throat: yes Swollen glands: no Sinus pressure: no Headache: yes Face pain: yes Toothache: yes Ear pain: yes bilateral Ear pressure: yes bilateral Eyes red/itching:no Eye drainage/crusting: yes  Vomiting: no Rash: no Fatigue: yes Sick contacts: no Strep contacts: no  Context: worse Recurrent sinusitis: yes Relief with OTC cold/cough medications: no  Treatments attempted: none   Relevant past medical, surgical, family and social history reviewed and updated as indicated. Interim medical history since our last visit reviewed. Allergies and medications reviewed and updated.  Review of Systems  Constitutional: Positive for fatigue and fever. Negative for activity change, appetite change, chills, diaphoresis and unexpected weight change.  HENT: Positive for congestion, ear pain, postnasal drip, rhinorrhea, sinus pressure and sinus pain. Negative for dental problem, drooling, ear discharge, facial swelling, hearing loss, mouth sores, nosebleeds, sneezing, sore throat, tinnitus, trouble swallowing and voice change.   Eyes: Negative.   Respiratory: Positive for cough. Negative for apnea, choking, chest tightness, shortness of breath, wheezing and stridor.   Cardiovascular: Negative.   Gastrointestinal: Negative.   Psychiatric/Behavioral: Negative.     Per HPI unless  specifically indicated above     Objective:    Pulse 93   Temp 100.1 F (37.8 C)   LMP 09/13/1975 (Approximate)   SpO2 98%   Wt Readings from Last 3 Encounters:  01/20/20 152 lb 9.6 oz (69.2 kg)  12/21/19 153 lb (69.4 kg)  11/08/19 145 lb (65.8 kg)    Physical Exam Vitals and nursing note reviewed.  Pulmonary:     Effort: Pulmonary effort is normal. No respiratory distress.     Comments: Speaking in full sentences Neurological:     Mental Status: She is alert.  Psychiatric:        Mood and Affect: Mood normal.        Behavior: Behavior normal.        Thought Content: Thought content normal.        Judgment: Judgment normal.     Results for orders placed or performed in visit on 76/73/41  Basic metabolic panel  Result Value Ref Range   Glucose 95 65 - 99 mg/dL   BUN 8 8 - 27 mg/dL   Creatinine, Ser 1.17 (H) 0.57 - 1.00 mg/dL   GFR calc non Af Amer 49 (L) >59 mL/min/1.73   GFR calc Af Amer 56 (L) >59 mL/min/1.73   BUN/Creatinine Ratio 7 (L) 12 - 28   Sodium 139 134 - 144 mmol/L   Potassium 4.7 3.5 - 5.2 mmol/L   Chloride 103 96 - 106 mmol/L   CO2 19 (L) 20 - 29 mmol/L   Calcium 9.4 8.7 - 10.3 mg/dL      Assessment & Plan:   Problem List Items Addressed This Visit   None   Visit  Diagnoses    Suspected COVID-19 virus infection    -  Primary   Will get her swabbed. Self-quarantine until results are back. Symptomatic care. Call with any concerns.    Relevant Orders   Novel Coronavirus, NAA ([facility])   Acute recurrent maxillary sinusitis       Will treat with prednisone and augmentin. Call if not getting better or getting worse. Continue to monitor.    Relevant Medications   predniSONE (DELTASONE) 50 MG tablet   amoxicillin-clavulanate (AUGMENTIN) 875-125 MG tablet   fluconazole (DIFLUCAN) 150 MG tablet       Follow up plan: Return if symptoms worsen or fail to improve.   . This visit was completed via telephone due to the restrictions of the COVID-19  pandemic. All issues as above were discussed and addressed but no physical exam was performed. If it was felt that the patient should be evaluated in the office, they were directed there. The patient verbally consented to this visit. Patient was unable to complete an audio/visual visit due to Lack of equipment. Due to the catastrophic nature of the COVID-19 pandemic, this visit was done through audio contact only. . Location of the patient: home . Location of the provider: work . Those involved with this call:  . Provider: Park Liter, DO . CMA: Louanna Raw, [place] . Front Desk/Registration: Davina Poke  . Time spent on call: 21 minutes on the phone discussing health concerns. 23 minutes total spent in review of patient's record and preparation of their chart.

## 2020-02-17 NOTE — Addendum Note (Signed)
Addended by: Georgina Peer on: 02/17/2020 11:04 AM   Modules accepted: Orders

## 2020-02-17 NOTE — Telephone Encounter (Signed)
I can see her now for a virtual.

## 2020-02-17 NOTE — Telephone Encounter (Signed)
Routing to providers in office to see if patient can be worked in.

## 2020-02-19 LAB — NOVEL CORONAVIRUS, NAA: SARS-CoV-2, NAA: NOT DETECTED

## 2020-02-19 LAB — SARS-COV-2, NAA 2 DAY TAT

## 2020-02-21 ENCOUNTER — Ambulatory Visit: Payer: Medicare Other | Admitting: Family Medicine

## 2020-02-22 ENCOUNTER — Ambulatory Visit: Payer: Medicare Other | Admitting: Family Medicine

## 2020-02-22 DIAGNOSIS — M5136 Other intervertebral disc degeneration, lumbar region: Secondary | ICD-10-CM | POA: Diagnosis not present

## 2020-02-22 DIAGNOSIS — M259 Joint disorder, unspecified: Secondary | ICD-10-CM | POA: Diagnosis not present

## 2020-02-22 DIAGNOSIS — M169 Osteoarthritis of hip, unspecified: Secondary | ICD-10-CM | POA: Diagnosis not present

## 2020-02-22 DIAGNOSIS — M9904 Segmental and somatic dysfunction of sacral region: Secondary | ICD-10-CM | POA: Diagnosis not present

## 2020-02-28 ENCOUNTER — Encounter: Payer: Self-pay | Admitting: Family Medicine

## 2020-02-29 ENCOUNTER — Telehealth: Payer: Medicare Other | Admitting: Family Medicine

## 2020-03-01 DIAGNOSIS — G894 Chronic pain syndrome: Secondary | ICD-10-CM | POA: Diagnosis not present

## 2020-03-21 DIAGNOSIS — G894 Chronic pain syndrome: Secondary | ICD-10-CM | POA: Diagnosis not present

## 2020-03-24 ENCOUNTER — Telehealth: Payer: Medicare Other | Admitting: Family Medicine

## 2020-03-29 DIAGNOSIS — M8700 Idiopathic aseptic necrosis of unspecified bone: Secondary | ICD-10-CM | POA: Diagnosis not present

## 2020-03-29 DIAGNOSIS — I70209 Unspecified atherosclerosis of native arteries of extremities, unspecified extremity: Secondary | ICD-10-CM | POA: Diagnosis not present

## 2020-03-29 DIAGNOSIS — T8484XA Pain due to internal orthopedic prosthetic devices, implants and grafts, initial encounter: Secondary | ICD-10-CM | POA: Diagnosis not present

## 2020-03-29 DIAGNOSIS — Z96643 Presence of artificial hip joint, bilateral: Secondary | ICD-10-CM | POA: Diagnosis not present

## 2020-03-29 DIAGNOSIS — M25552 Pain in left hip: Secondary | ICD-10-CM | POA: Diagnosis not present

## 2020-03-29 DIAGNOSIS — M25551 Pain in right hip: Secondary | ICD-10-CM | POA: Diagnosis not present

## 2020-03-31 DIAGNOSIS — G894 Chronic pain syndrome: Secondary | ICD-10-CM | POA: Diagnosis not present

## 2020-04-04 ENCOUNTER — Telehealth (INDEPENDENT_AMBULATORY_CARE_PROVIDER_SITE_OTHER): Payer: Medicare Other | Admitting: Family Medicine

## 2020-04-04 ENCOUNTER — Encounter: Payer: Self-pay | Admitting: Family Medicine

## 2020-04-04 VITALS — Temp 100.0°F

## 2020-04-04 DIAGNOSIS — J0101 Acute recurrent maxillary sinusitis: Secondary | ICD-10-CM | POA: Diagnosis not present

## 2020-04-04 DIAGNOSIS — Z20822 Contact with and (suspected) exposure to covid-19: Secondary | ICD-10-CM

## 2020-04-04 MED ORDER — PREDNISONE 10 MG PO TABS: ORAL_TABLET | ORAL | 0 refills | Status: DC

## 2020-04-04 MED ORDER — AMOXICILLIN-POT CLAVULANATE 875-125 MG PO TABS: 1.0000 | ORAL_TABLET | Freq: Two times a day (BID) | ORAL | 0 refills | Status: DC

## 2020-04-04 NOTE — Progress Notes (Signed)
Temp 100 F (37.8 C)   LMP 09/13/1975 (Approximate)    Subjective:    Patient ID: Amy Rocha, female    DOB: 1955-06-19, 65 y.o.   MRN: [id number]  HPI: Amy Rocha is a 65 y.o. female  Chief Complaint  Patient presents with  . URI    Pt states she has had a fever, congestion, headache, green phlegm, runny nose, and cough that started about 2 days ago. States she was in close proximity with her sister who she thinks may have covid but states they were both wearing masks    UPPER RESPIRATORY TRACT INFECTION Duration: 03/30/20 Worst symptom: congestion and headache Fever: yes 100 Cough: no Shortness of breath: no Wheezing: no Chest pain: no Chest tightness: no Chest congestion: no Nasal congestion: yes Runny nose: yes Post nasal drip: yes Sneezing: no Sore throat: no Swollen glands: no Sinus pressure: yes Headache: yes Face pain: yes Toothache: yes Ear pain: yes  Ear pressure: no  Eyes red/itching:no Eye drainage/crusting: no  Vomiting: no Rash: no Fatigue: yes Sick contacts: yes Strep contacts: no  Context: worse Recurrent sinusitis: yes Relief with OTC cold/cough medications: no  Treatments attempted: none   Relevant past medical, surgical, family and social history reviewed and updated as indicated. Interim medical history since our last visit reviewed. Allergies and medications reviewed and updated.  Review of Systems  Constitutional: Positive for fatigue and fever. Negative for activity change, appetite change, chills, diaphoresis and unexpected weight change.  HENT: Positive for congestion, postnasal drip, rhinorrhea, sinus pressure and sinus pain. Negative for dental problem, drooling, ear discharge, ear pain, facial swelling, hearing loss, mouth sores, nosebleeds, sneezing, sore throat, tinnitus, trouble swallowing and voice change.   Respiratory: Negative.   Cardiovascular: Negative.   Gastrointestinal: Negative.   Neurological: Negative.    Psychiatric/Behavioral: Negative.     Per HPI unless specifically indicated above     Objective:    Temp 100 F (37.8 C)   LMP 09/13/1975 (Approximate)   Wt Readings from Last 3 Encounters:  01/20/20 152 lb 9.6 oz (69.2 kg)  12/21/19 153 lb (69.4 kg)  11/08/19 145 lb (65.8 kg)    Physical Exam Vitals and nursing note reviewed.  Constitutional:      General: She is not in acute distress.    Appearance: Normal appearance. She is not ill-appearing, toxic-appearing or diaphoretic.  HENT:     Head: Normocephalic and atraumatic.     Right Ear: External ear normal.     Left Ear: External ear normal.     Nose: Nose normal.     Mouth/Throat:     Mouth: Mucous membranes are moist.     Pharynx: Oropharynx is clear.  Eyes:     General: No scleral icterus.       Right eye: No discharge.        Left eye: No discharge.     Conjunctiva/sclera: Conjunctivae normal.     Pupils: Pupils are equal, round, and reactive to light.  Pulmonary:     Effort: Pulmonary effort is normal. No respiratory distress.     Comments: Speaking in full sentences Musculoskeletal:        General: Normal range of motion.     Cervical back: Normal range of motion.  Skin:    Coloration: Skin is not jaundiced or pale.     Findings: No bruising, erythema, lesion or rash.  Neurological:     Mental Status: She is alert and oriented to  person, place, and time. Mental status is at baseline.  Psychiatric:        Mood and Affect: Mood normal.        Behavior: Behavior normal.        Thought Content: Thought content normal.        Judgment: Judgment normal.     Results for orders placed or performed in visit on 02/17/20  Novel Coronavirus, NAA ([facility])   Specimen: Nasopharyngeal(NP) swabs in vial transport medium  Result Value Ref Range   SARS-CoV-2, NAA Not Detected Not Detected  SARS-COV-2, NAA 2 DAY TAT  Result Value Ref Range   SARS-CoV-2, NAA 2 DAY TAT Performed       Assessment & Plan:   Problem  List Items Addressed This Visit   None   Visit Diagnoses    Suspected COVID-19 virus infection    -  Primary   Will check test at home. Await results. Self-quarantine until results come back.    Acute recurrent maxillary sinusitis       Will treat with prednisone and augmentin. Call with any concerns. Call if not getting better or getting worse.    Relevant Medications   amoxicillin-clavulanate (AUGMENTIN) 875-125 MG tablet   predniSONE (DELTASONE) 10 MG tablet       Follow up plan: Return if symptoms worsen or fail to improve.    . This visit was completed via MyChart due to the restrictions of the COVID-19 pandemic. All issues as above were discussed and addressed. Physical exam was done as above through visual confirmation on MyChart. If it was felt that the patient should be evaluated in the office, they were directed there. The patient verbally consented to this visit. . Location of the patient: home . Location of the provider: work . Those involved with this call:  . Provider: Park Liter, DO . CMA: Louanna Raw, [place] . Front Desk/Registration: Jill Side  . Time spent on call: 15 minutes with patient face to face via video conference. More than 50% of this time was spent in counseling and coordination of care. 23 minutes total spent in review of patient's record and preparation of their chart.

## 2020-04-05 ENCOUNTER — Encounter: Payer: Self-pay | Admitting: Family Medicine

## 2020-04-05 ENCOUNTER — Telehealth: Payer: Self-pay | Admitting: Family Medicine

## 2020-04-05 ENCOUNTER — Other Ambulatory Visit: Payer: Self-pay | Admitting: Orthopedic Surgery

## 2020-04-05 NOTE — Telephone Encounter (Signed)
Pt states Dr Wynetta Emery wanted her to do a home covid test.  Pt states she did, and her test result was negative.  Pt says Thank you for calling in the prescriptions!!

## 2020-04-18 DIAGNOSIS — G894 Chronic pain syndrome: Secondary | ICD-10-CM | POA: Diagnosis not present

## 2020-04-19 ENCOUNTER — Encounter
Admission: RE | Admit: 2020-04-19 | Discharge: 2020-04-19 | Disposition: A | Discharge status: Home / Self Care | Payer: Medicare Other | Source: Ambulatory Visit | Attending: Orthopedic Surgery | Admitting: Orthopedic Surgery

## 2020-04-19 ENCOUNTER — Other Ambulatory Visit: Payer: Self-pay

## 2020-04-19 DIAGNOSIS — I493 Ventricular premature depolarization: Secondary | ICD-10-CM | POA: Insufficient documentation

## 2020-04-19 DIAGNOSIS — Z01818 Encounter for other preprocedural examination: Secondary | ICD-10-CM | POA: Insufficient documentation

## 2020-04-19 DIAGNOSIS — Z01810 Encounter for preprocedural cardiovascular examination: Secondary | ICD-10-CM | POA: Diagnosis not present

## 2020-04-19 LAB — CBC
HCT: 42.8 % (ref 36.0–46.0)
Hemoglobin: 14.2 g/dL (ref 12.0–15.0)
MCH: 31.3 pg (ref 26.0–34.0)
MCHC: 33.2 g/dL (ref 30.0–36.0)
MCV: 94.3 fL (ref 80.0–100.0)
Platelets: 342 10*3/uL (ref 150–400)
RBC: 4.54 MIL/uL (ref 3.87–5.11)
RDW: 13.4 % (ref 11.5–15.5)
WBC: 10.2 10*3/uL (ref 4.0–10.5)
nRBC: 0 % (ref 0.0–0.2)

## 2020-04-19 NOTE — Patient Instructions (Signed)
Your procedure is scheduled on: 04/27/20 Report to [place] [facility] at Admitting desk first. To find out your arrival time please call [phone] between 1PM - 3PM on 04/26/20.  Remember: Instructions that are not followed completely may result in serious medical risk, up to and including death, or upon the discretion of your surgeon and anesthesiologist your surgery may need to be rescheduled.     _X__ 1. Do not eat food after midnight the night before your procedure.                 No gum chewing or hard candies. You may drink clear liquids up to 2 hours                 before you are scheduled to arrive for your surgery-                   Clear Liquids include:  water, apple juice without pulp, clear carbohydrate                 drink such as Clearfast or Gatorade, Black Coffee or Tea (Do not add                 anything to coffee or tea). Diabetics water only DR Rudene Christians ordered the Ensure Pre Surgery drink for you. It should be finished 2 hours befoe arriving v __X__2.  On the morning of surgery brush your teeth with toothpaste and water, you                 may rinse your mouth with mouthwash if you wish.  Do not swallow any              toothpaste of mouthwash.     _X__ 3.  No Alcohol for 24 hours before or after surgery.   _X__ 4.  Do Not Smoke or use e-cigarettes For 24 Hours Prior to Your Surgery.                 Do not use any chewable tobacco products for at least 6 hours prior to                 surgery.  ____  5.  Bring all medications with you on the day of surgery if instructed.   __X__  6.  Notify your doctor if there is any change in your medical condition      (cold, fever, infections).     Do not wear jewelry, make-up, hairpins, clips or nail polish. Do not wear lotions, powders, or perfumes.  Do not shave 48 hours prior to surgery. Men may shave face and neck. Do not bring valuables to the hospital.    [facility] is not responsible for any belongings or valuables.  Contacts, dentures/partials or body piercings may not be worn into surgery. Bring a case for your contacts, glasses or hearing aids, a denture cup will be supplied. Leave your suitcase in the car. After surgery it may be brought to your room. For patients admitted to the hospital, discharge time is determined by your treatment team.   Patients discharged the day of surgery will not be allowed to drive home.   Please read over the following fact sheets that you were given:   MRSA Information, chg soap, incentive spirometer  __X__ Take these medicines the morning of surgery with A SIP OF WATER:  1. buPROPion (WELLBUTRIN XL) 300 MG 24 hr tablet  2. omeprazole (PRILOSEC) 20 MG capsule  3. Oxycodone HCl 20 MG TABS if needed  4. valACYclovir (VALTREX) 500 MG tablet if needed  5. vortioxetine HBr (TRINTELLIX) 20 MG TABS tablet  6.  ____ Fleet Enema (as directed)   __X__ Use CHG Soap/SAGE wipes as directed USE 1/2 BOTTLE FOR EACH SHOWER  __X__ Use inhalers on the day of surgery  ____ Stop metformin/Janumet/Farxiga 2 days prior to surgery    ____ Take 1/2 of usual insulin dose the night before surgery. No insulin the morning          of surgery.   ____ Stop Blood Thinners Coumadin/Plavix/Xarelto/Pleta/Pradaxa/Eliquis/Effient/Aspirin  on   Or contact your Surgeon, Cardiologist or Medical Doctor regarding  ability to stop your blood thinners  __X__ Stop Anti-inflammatories 7 days before surgery such as Advil, Ibuprofen, Motrin,  BC or Goodies Powder, Naprosyn, Naproxen, Aleve, Aspirin    __X__ Stop all herbal supplements, fish oil or vitamin E until after surgery.    ____ Bring C-Pap to the hospital.

## 2020-04-20 ENCOUNTER — Other Ambulatory Visit: Payer: Medicare Other

## 2020-04-21 ENCOUNTER — Other Ambulatory Visit: Payer: Medicare Other

## 2020-04-25 ENCOUNTER — Other Ambulatory Visit
Admission: RE | Admit: 2020-04-25 | Discharge: 2020-04-25 | Disposition: A | Discharge status: Home / Self Care | Payer: Medicare Other | Source: Ambulatory Visit | Attending: Orthopedic Surgery | Admitting: Orthopedic Surgery

## 2020-04-25 ENCOUNTER — Other Ambulatory Visit: Payer: Self-pay

## 2020-04-25 ENCOUNTER — Telehealth: Payer: Medicare Other | Admitting: Family Medicine

## 2020-04-25 ENCOUNTER — Telehealth: Payer: Self-pay

## 2020-04-25 DIAGNOSIS — Z20822 Contact with and (suspected) exposure to covid-19: Secondary | ICD-10-CM | POA: Insufficient documentation

## 2020-04-25 DIAGNOSIS — Z01812 Encounter for preprocedural laboratory examination: Secondary | ICD-10-CM | POA: Insufficient documentation

## 2020-04-25 NOTE — Telephone Encounter (Signed)
Copied from [place] [id number] [id number]. Topic: General - Other >> Apr 25, 2020  9:16 AM Leward Quan A wrote: Reason for CRM: Patient wanted to inform Dr Wynetta Emery that she woke up this morning and her legs were back to normal so she cancelled her appointment.

## 2020-04-26 LAB — SARS CORONAVIRUS 2 (TAT 6-24 HRS): SARS Coronavirus 2: NEGATIVE

## 2020-04-27 ENCOUNTER — Ambulatory Visit
Admission: RE | Admit: 2020-04-27 | Discharge: 2020-04-27 | Disposition: A | Discharge status: Home / Self Care | Payer: Medicare Other | Attending: Orthopedic Surgery | Admitting: Orthopedic Surgery

## 2020-04-27 ENCOUNTER — Encounter: Payer: Self-pay | Admitting: Orthopedic Surgery

## 2020-04-27 ENCOUNTER — Encounter
Admission: RE | Disposition: A | Discharge status: Home / Self Care | Payer: Self-pay | Source: Home / Self Care | Attending: Orthopedic Surgery

## 2020-04-27 ENCOUNTER — Ambulatory Visit: Payer: Medicare Other | Admitting: Certified Registered"

## 2020-04-27 ENCOUNTER — Other Ambulatory Visit: Payer: Self-pay

## 2020-04-27 DIAGNOSIS — T8484XA Pain due to internal orthopedic prosthetic devices, implants and grafts, initial encounter: Secondary | ICD-10-CM | POA: Diagnosis not present

## 2020-04-27 DIAGNOSIS — Z7951 Long term (current) use of inhaled steroids: Secondary | ICD-10-CM | POA: Diagnosis not present

## 2020-04-27 DIAGNOSIS — I130 Hypertensive heart and chronic kidney disease with heart failure and stage 1 through stage 4 chronic kidney disease, or unspecified chronic kidney disease: Secondary | ICD-10-CM | POA: Diagnosis not present

## 2020-04-27 DIAGNOSIS — Z881 Allergy status to other antibiotic agents status: Secondary | ICD-10-CM | POA: Diagnosis not present

## 2020-04-27 DIAGNOSIS — Z9109 Other allergy status, other than to drugs and biological substances: Secondary | ICD-10-CM | POA: Diagnosis not present

## 2020-04-27 DIAGNOSIS — Z79899 Other long term (current) drug therapy: Secondary | ICD-10-CM | POA: Insufficient documentation

## 2020-04-27 DIAGNOSIS — Z87891 Personal history of nicotine dependence: Secondary | ICD-10-CM | POA: Insufficient documentation

## 2020-04-27 DIAGNOSIS — E785 Hyperlipidemia, unspecified: Secondary | ICD-10-CM | POA: Diagnosis not present

## 2020-04-27 DIAGNOSIS — Y838 Other surgical procedures as the cause of abnormal reaction of the patient, or of later complication, without mention of misadventure at the time of the procedure: Secondary | ICD-10-CM | POA: Insufficient documentation

## 2020-04-27 DIAGNOSIS — I509 Heart failure, unspecified: Secondary | ICD-10-CM | POA: Diagnosis not present

## 2020-04-27 DIAGNOSIS — N184 Chronic kidney disease, stage 4 (severe): Secondary | ICD-10-CM | POA: Diagnosis not present

## 2020-04-27 DIAGNOSIS — K219 Gastro-esophageal reflux disease without esophagitis: Secondary | ICD-10-CM | POA: Diagnosis not present

## 2020-04-27 HISTORY — PX: HARDWARE REMOVAL: SHX979

## 2020-04-27 SURGERY — REMOVAL, HARDWARE
Anesthesia: General | Laterality: Left

## 2020-04-27 MED ORDER — ONDANSETRON HCL 4 MG/2ML IJ SOLN: 4.0000 mg | Freq: Four times a day (QID) | INTRAMUSCULAR | Status: DC | PRN

## 2020-04-27 MED ORDER — LACTATED RINGERS IV SOLN: INTRAVENOUS | Status: DC

## 2020-04-27 MED ORDER — SUCCINYLCHOLINE CHLORIDE 20 MG/ML IJ SOLN
INTRAMUSCULAR | Status: DC | PRN
  Administered 2020-04-27: 80 mg via INTRAVENOUS

## 2020-04-27 MED ORDER — FENTANYL CITRATE (PF) 100 MCG/2ML IJ SOLN
INTRAMUSCULAR | Status: AC
  Administered 2020-04-27: 25 ug via INTRAVENOUS
  Filled 2020-04-27: qty 2

## 2020-04-27 MED ORDER — CHLORHEXIDINE GLUCONATE 0.12 % MT SOLN
OROMUCOSAL | Status: AC
  Administered 2020-04-27: 15 mL via OROMUCOSAL
  Filled 2020-04-27: qty 15

## 2020-04-27 MED ORDER — OXYCODONE-ACETAMINOPHEN 10-325 MG PO TABS: 1.0000 | ORAL_TABLET | Freq: Four times a day (QID) | ORAL | 0 refills | Status: DC | PRN

## 2020-04-27 MED ORDER — PROPOFOL 10 MG/ML IV BOLUS
INTRAVENOUS | Status: AC
  Filled 2020-04-27: qty 20

## 2020-04-27 MED ORDER — METOCLOPRAMIDE HCL 5 MG/ML IJ SOLN: 5.0000 mg | Freq: Three times a day (TID) | INTRAMUSCULAR | Status: DC | PRN

## 2020-04-27 MED ORDER — FENTANYL CITRATE (PF) 100 MCG/2ML IJ SOLN
INTRAMUSCULAR | Status: AC
  Filled 2020-04-27: qty 2

## 2020-04-27 MED ORDER — CHLORHEXIDINE GLUCONATE 0.12 % MT SOLN: 15.0000 mL | Freq: Once | OROMUCOSAL | Status: AC

## 2020-04-27 MED ORDER — FENTANYL CITRATE (PF) 100 MCG/2ML IJ SOLN
25.0000 ug | INTRAMUSCULAR | Status: AC | PRN
Start: 2020-04-27 — End: 2020-04-27
  Administered 2020-04-27 (×6): 25 ug via INTRAVENOUS

## 2020-04-27 MED ORDER — ONDANSETRON HCL 4 MG/2ML IJ SOLN
INTRAMUSCULAR | Status: DC | PRN
  Administered 2020-04-27: 4 mg via INTRAVENOUS

## 2020-04-27 MED ORDER — OXYCODONE HCL 5 MG PO TABS
ORAL_TABLET | ORAL | Status: AC
  Filled 2020-04-27: qty 1

## 2020-04-27 MED ORDER — METOCLOPRAMIDE HCL 10 MG PO TABS: 5.0000 mg | ORAL_TABLET | Freq: Three times a day (TID) | ORAL | Status: DC | PRN

## 2020-04-27 MED ORDER — OXYCODONE HCL 5 MG PO TABS
5.0000 mg | ORAL_TABLET | Freq: Once | ORAL | Status: AC
  Administered 2020-04-27: 5 mg via ORAL

## 2020-04-27 MED ORDER — SCOPOLAMINE 1 MG/3DAYS TD PT72
1.0000 | MEDICATED_PATCH | TRANSDERMAL | Status: DC
  Administered 2020-04-27: 1.5 mg via TRANSDERMAL

## 2020-04-27 MED ORDER — GUAIFENESIN-CODEINE 100-10 MG/5ML PO SOLN
5.0000 mL | ORAL | Status: DC
  Filled 2020-04-27: qty 5

## 2020-04-27 MED ORDER — SCOPOLAMINE 1 MG/3DAYS TD PT72
MEDICATED_PATCH | TRANSDERMAL | Status: AC
  Filled 2020-04-27: qty 1

## 2020-04-27 MED ORDER — LIDOCAINE HCL (CARDIAC) PF 100 MG/5ML IV SOSY
PREFILLED_SYRINGE | INTRAVENOUS | Status: DC | PRN
  Administered 2020-04-27: 80 mg via INTRAVENOUS

## 2020-04-27 MED ORDER — MIDAZOLAM HCL 2 MG/2ML IJ SOLN
INTRAMUSCULAR | Status: AC
  Administered 2020-04-27: 1 mg via INTRAVENOUS
  Filled 2020-04-27: qty 2

## 2020-04-27 MED ORDER — GUAIFENESIN-CODEINE 100-10 MG/5ML PO SOLN: 5.0000 mL | Freq: Once | ORAL | Status: DC

## 2020-04-27 MED ORDER — ORAL CARE MOUTH RINSE: 15.0000 mL | Freq: Once | OROMUCOSAL | Status: AC

## 2020-04-27 MED ORDER — FENTANYL CITRATE (PF) 100 MCG/2ML IJ SOLN
INTRAMUSCULAR | Status: DC | PRN
  Administered 2020-04-27 (×2): 50 ug via INTRAVENOUS

## 2020-04-27 MED ORDER — ONDANSETRON HCL 4 MG/2ML IJ SOLN: 4.0000 mg | Freq: Once | INTRAMUSCULAR | Status: DC | PRN

## 2020-04-27 MED ORDER — PROPOFOL 10 MG/ML IV BOLUS
INTRAVENOUS | Status: DC | PRN
  Administered 2020-04-27: 140 mg via INTRAVENOUS

## 2020-04-27 MED ORDER — MIDAZOLAM HCL 2 MG/2ML IJ SOLN: 1.0000 mg | Freq: Once | INTRAMUSCULAR | Status: AC

## 2020-04-27 MED ORDER — ONDANSETRON HCL 4 MG PO TABS: 4.0000 mg | ORAL_TABLET | Freq: Four times a day (QID) | ORAL | Status: DC | PRN

## 2020-04-27 MED ORDER — CEFAZOLIN SODIUM-DEXTROSE 2-4 GM/100ML-% IV SOLN
2.0000 g | INTRAVENOUS | Status: AC
  Administered 2020-04-27: 2 g via INTRAVENOUS

## 2020-04-27 MED ORDER — OXYCODONE-ACETAMINOPHEN 5-325 MG PO TABS
1.0000 | ORAL_TABLET | Freq: Once | ORAL | Status: AC
  Administered 2020-04-27: 1 via ORAL

## 2020-04-27 MED ORDER — CEFAZOLIN SODIUM-DEXTROSE 2-4 GM/100ML-% IV SOLN
INTRAVENOUS | Status: AC
  Filled 2020-04-27: qty 100

## 2020-04-27 MED ORDER — OXYCODONE-ACETAMINOPHEN 5-325 MG PO TABS
ORAL_TABLET | ORAL | Status: AC
  Filled 2020-04-27: qty 1

## 2020-04-27 MED ORDER — PHENYLEPHRINE HCL (PRESSORS) 10 MG/ML IV SOLN
INTRAVENOUS | Status: DC | PRN
  Administered 2020-04-27: 150 ug via INTRAVENOUS

## 2020-04-27 SURGICAL SUPPLY — 40 items
APL PRP STRL LF DISP 70% ISPRP (MISCELLANEOUS) ×1
CANISTER SUCT 1200ML W/VALVE (MISCELLANEOUS) ×2 IMPLANT
CHLORAPREP W/TINT 26 (MISCELLANEOUS) ×2 IMPLANT
COVER WAND RF STERILE (DRAPES) ×2 IMPLANT
CUFF TOURN SGL QUICK 24 (TOURNIQUET CUFF)
CUFF TOURN SGL QUICK 30 (TOURNIQUET CUFF)
CUFF TRNQT CYL 24X4X16.5-23 (TOURNIQUET CUFF) IMPLANT
CUFF TRNQT CYL 30X4X21-28X (TOURNIQUET CUFF) IMPLANT
DRAPE C-ARM XRAY 36X54 (DRAPES) ×2 IMPLANT
DRAPE INCISE IOBAN 66X45 STRL (DRAPES) ×2 IMPLANT
DRSG EMULSION OIL 3X8 NADH (GAUZE/BANDAGES/DRESSINGS) ×2 IMPLANT
ELECT CAUTERY BLADE 6.4 (BLADE) ×2 IMPLANT
ELECT REM PT RETURN 9FT ADLT (ELECTROSURGICAL) ×2
ELECTRODE REM PT RTRN 9FT ADLT (ELECTROSURGICAL) ×1 IMPLANT
GAUZE SPONGE 4X4 12PLY STRL (GAUZE/BANDAGES/DRESSINGS) ×2 IMPLANT
GAUZE XEROFORM 1X8 LF (GAUZE/BANDAGES/DRESSINGS) ×2 IMPLANT
GLOVE SURG SYN 9.0  PF PI (GLOVE) ×2
GLOVE SURG SYN 9.0 PF PI (GLOVE) ×1 IMPLANT
GOWN SRG 2XL LVL 4 RGLN SLV (GOWNS) ×1 IMPLANT
GOWN STRL NON-REIN 2XL LVL4 (GOWNS) ×2
GOWN STRL REUS W/ TWL LRG LVL3 (GOWN DISPOSABLE) ×1 IMPLANT
GOWN STRL REUS W/TWL LRG LVL3 (GOWN DISPOSABLE) ×2
KIT TURNOVER KIT A (KITS) ×2 IMPLANT
MANIFOLD NEPTUNE II (INSTRUMENTS) ×2 IMPLANT
NDL FILTER BLUNT 18X1 1/2 (NEEDLE) ×1 IMPLANT
NEEDLE FILTER BLUNT 18X 1/2SAF (NEEDLE) ×1
NEEDLE FILTER BLUNT 18X1 1/2 (NEEDLE) ×1 IMPLANT
NS IRRIG 1000ML POUR BTL (IV SOLUTION) ×2 IMPLANT
PACK EXTREMITY ARMC (MISCELLANEOUS) ×2 IMPLANT
PAD ABD DERMACEA PRESS 5X9 (GAUZE/BANDAGES/DRESSINGS) ×4 IMPLANT
SCALPEL PROTECTED #15 DISP (BLADE) ×4 IMPLANT
STAPLER SKIN PROX 35W (STAPLE) ×2 IMPLANT
SUT ETHIBOND NAB CT1 #1 30IN (SUTURE) ×2 IMPLANT
SUT ETHILON 3-0 FS-10 30 BLK (SUTURE) ×2
SUT VIC AB 0 CT1 36 (SUTURE) ×2 IMPLANT
SUT VIC AB 2-0 CT1 27 (SUTURE) ×2
SUT VIC AB 2-0 CT1 TAPERPNT 27 (SUTURE) ×1 IMPLANT
SUTURE EHLN 3-0 FS-10 30 BLK (SUTURE) ×1 IMPLANT
SYR 10ML LL (SYRINGE) ×2 IMPLANT
WATER STERILE IRR 1000ML POUR (IV SOLUTION) ×2 IMPLANT

## 2020-04-27 NOTE — H&P (Signed)
Amy Rocha is an 65 y.o. female.   Chief Complaint: Left hip pain HPI: Patient had a prior left total hip and subsequent revision and had trochanteric nonunion that was treated with trochanteric grip.  This is become increasingly painful over the lateral hip and she is admitted today for removal of trochanteric grip deep hardware removal left hip  Past Medical History:  Diagnosis Date  . Anxiety   . Arthritis   . Asthma   . Avascular necrosis ([place])   . Blood transfusion without reported diagnosis   . Calculus of kidney   . CHF (congestive heart failure) ([place])   . Chronic pain syndrome   . Complication of anesthesia    aspirated in recovery after  hip replacement 1998  . COPD (chronic obstructive pulmonary disease) ([place])   . Depression   . GERD (gastroesophageal reflux disease)   . Heart failure ([place])   . History of kidney stones   . Hyperlipidemia   . Hypertension   . IBS (irritable bowel syndrome)   . Joint pain   . Osteoporosis   . Oxygen deficiency   . Peripheral vascular disease ([place])   . PONV (postoperative nausea and vomiting)   . Urge incontinence     Past Surgical History:  Procedure Laterality Date  . ABDOMINAL HYSTERECTOMY    . avascular necrosis    . CARDIAC CATHETERIZATION Left 12/01/2015   Procedure: Left Heart Cath and Coronary Angiography;  Surgeon: Teodoro Spray, MD;  Location: [facility];  Service: Cardiovascular;  Laterality: Left;  . COLONOSCOPY WITH PROPOFOL N/A 10/08/2019   Procedure: COLONOSCOPY WITH PROPOFOL;  Surgeon: [name], MD;  Location: [facility] ENDOSCOPY;  Service: Gastroenterology;  Laterality: N/A;  . ESOPHAGOGASTRODUODENOSCOPY (EGD) WITH PROPOFOL N/A 11/08/2019   Procedure: ESOPHAGOGASTRODUODENOSCOPY (EGD) WITH PROPOFOL;  Surgeon: [name], MD;  Location: [facility] ENDOSCOPY;  Service: Gastroenterology;  Laterality: N/A;  . eyelid lift    . HERNIA REPAIR    . INTRAMEDULLARY (IM) NAIL INTERTROCHANTERIC Left 10/14/2017    Procedure: REPAIR OF LEFT GREATER TROCHANTER NONUNION;  Surgeon: Hessie Knows, MD;  Location: [facility] ORS;  Service: Orthopedics;  Laterality: Left;  . JOINT REPLACEMENT     hip  . NASAL SINUS SURGERY    . TOTAL HIP REVISION Left 01/21/2017   Procedure: TOTAL HIP REVISION FEMORAL STEM WITH POLYETHYLENE EXCHANGE WITH CUPLINER;  Surgeon: Hessie Knows, MD;  Location: [facility] ORS;  Service: Orthopedics;  Laterality: Left;    Family History  Problem Relation Age of Onset  . Dementia Mother   . Cancer Father        colon  . Arthritis Brother   . Hypertension Daughter   . Aneurysm Brother    Social History:  reports that she quit smoking about 24 years ago. She has never used smokeless tobacco. She reports that she does not drink alcohol and does not use drugs.  Allergies:  Allergies  Allergen Reactions  . Fluticasone-Salmeterol Swelling and Other (See Comments)    Tongue sores  . Lactose Intolerance (Gi) Other (See Comments)    Bloating, GI upset, diarrhea  . Tetracyclines & Related Other (See Comments)    Went in to the sun and broke out    Medications Prior to Admission  Medication Sig Dispense Refill  . acetaminophen (TYLENOL) 500 MG tablet Take 1,000 mg by mouth 2 (two) times daily as needed for moderate pain or headache.     . albuterol (VENTOLIN HFA) 108 (90 Base) MCG/ACT inhaler Inhale  2 puffs into the lungs every 6 (six) hours as needed for wheezing or shortness of breath. 3 each 3  . amitriptyline (ELAVIL) 10 MG tablet Take 10 mg by mouth at bedtime.    . ARIPiprazole (ABILIFY) 2 MG tablet Take 1 tablet (2 mg total) by mouth at bedtime. 30 tablet 3  . atorvastatin (LIPITOR) 40 MG tablet Take 1 tablet (40 mg total) by mouth at bedtime. 90 tablet 1  . budesonide-formoterol (SYMBICORT) 160-4.5 MCG/ACT inhaler Inhale 2 puffs into the lungs 2 (two) times daily as needed (shortness of breath). 1 each 6  . buPROPion (WELLBUTRIN XL) 300 MG 24 hr tablet Take 1 tablet (300 mg total) by  mouth in the morning. 90 tablet 1  . cromolyn (OPTICROM) 4 % ophthalmic solution Place 1 drop into both eyes 4 (four) times daily. (Patient taking differently: Place 1 drop into both eyes 4 (four) times daily as needed (allergies). As needed) 10 mL 12  . fluticasone (FLONASE) 50 MCG/ACT nasal spray Place 2 sprays into both nostrils daily. (Patient taking differently: Place 2 sprays into both nostrils daily as needed for allergies.) 16 g 6  . levocetirizine (XYZAL) 5 MG tablet Take 1 tablet (5 mg total) by mouth every evening. (Patient taking differently: Take 5 mg by mouth daily as needed for allergies.) 90 tablet 3  . losartan (COZAAR) 25 MG tablet Take 1 tablet (25 mg total) by mouth daily. 90 tablet 1  . magnesium hydroxide (MILK OF MAGNESIA) 400 MG/5ML suspension Take 30 mLs by mouth daily as needed for mild constipation. 360 mL 0  . montelukast (SINGULAIR) 10 MG tablet Take 1 tablet (10 mg total) by mouth at bedtime. 90 tablet 1  . omeprazole (PRILOSEC) 20 MG capsule Take 1 capsule (20 mg total) by mouth daily. 90 capsule 1  . ondansetron (ZOFRAN-ODT) 4 MG disintegrating tablet Take 1 tablet (4 mg total) by mouth every 8 (eight) hours as needed for nausea or vomiting. 20 tablet 6  . Oxycodone HCl 20 MG TABS Take 20 mg by mouth every 4 (four) hours as needed (pain).    Marland Kitchen rOPINIRole (REQUIP) 0.5 MG tablet Take 1 tablet (0.5 mg total) by mouth at bedtime as needed. (Patient taking differently: Take 0.5 mg by mouth at bedtime as needed (RLS).) 90 tablet 1  . valACYclovir (VALTREX) 500 MG tablet Take 4 tablets (2,000 mg total) by mouth 2 (two) times daily as needed. (Patient taking differently: Take 2,000 mg by mouth 2 (two) times daily as needed (fever blisters).) 720 tablet 1  . VITAMIN D PO Take 2,000 mg by mouth daily.    Marland Kitchen vortioxetine HBr (TRINTELLIX) 20 MG TABS tablet TAKE 1 TABLET BY MOUTH DAILY. (Patient taking differently: Take 20 mg by mouth daily. TAKE 1 TABLET BY MOUTH DAILY.) 90 tablet 1   . amoxicillin-clavulanate (AUGMENTIN) 875-125 MG tablet Take 1 tablet by mouth 2 (two) times daily. (Patient not taking: Reported on 04/07/2020) 20 tablet 0  . NARCAN 4 MG/0.1ML LIQD nasal spray kit Place 1 spray into the nose once.  (Patient not taking: Reported on 04/27/2020)  0    Results for orders placed or performed during the hospital encounter of 04/25/20 (from the past 48 hour(s))  SARS CORONAVIRUS 2 (TAT 6-24 HRS) Nasopharyngeal Nasopharyngeal Swab     Status: None   Collection Time: 04/25/20  2:24 PM   Specimen: Nasopharyngeal Swab  Result Value Ref Range   SARS Coronavirus 2 NEGATIVE NEGATIVE    Comment: (NOTE)  SARS-CoV-2 target nucleic acids are NOT DETECTED.  The SARS-CoV-2 RNA is generally detectable in upper and lower respiratory specimens during the acute phase of infection. Negative results do not preclude SARS-CoV-2 infection, do not rule out co-infections with other pathogens, and should not be used as the sole basis for treatment or other patient management decisions. Negative results must be combined with clinical observations, patient history, and epidemiological information. The expected result is Negative.  Fact Sheet for Patients: SugarRoll.be  Fact Sheet for Healthcare Providers: https://www.woods-mathews.com/  This test is not yet approved or cleared by the [place] FDA and  has been authorized for detection and/or diagnosis of SARS-CoV-2 by FDA under an Emergency Use Authorization (EUA). This EUA will remain  in effect (meaning this test can be used) for the duration of the COVID-19 declaration under Se ction 564(b)(1) of the Act, 21 U.S.C. section 360bbb-3(b)(1), unless the authorization is terminated or revoked sooner.  Performed at [facility], [place] [address] [place], [place] [address]    No results found.  Review of Systems  Blood pressure 131/77, pulse 93, temperature (!) 97.5 F (36.4  C), temperature source Temporal, resp. rate 16, last menstrual period 09/13/1975, SpO2 96 %. Physical Exam  Lungs clear  heart r regular ate and rhythm Tender over the lateral aspect of the hip with well-healed scars.  There is a palpable mass consistent with hardware Assessment/Plan Painful hardware left hip secondary to trochanteric grip and wires Plan is to remove deep hardware  Hessie Knows, MD 04/27/2020, 11:59 AM

## 2020-04-27 NOTE — Anesthesia Procedure Notes (Signed)
Procedure Name: Intubation Date/Time: 04/27/2020 12:37 PM Performed by: Chanetta Marshall, CRNA Pre-anesthesia Checklist: Patient identified, Emergency Drugs available, Suction available and Patient being monitored Patient Re-evaluated:Patient Re-evaluated prior to induction Oxygen Delivery Method: Circle system utilized Preoxygenation: Pre-oxygenation with 100% oxygen Induction Type: IV induction Laryngoscope Size: McGraph and 3 Grade View: Grade I Tube type: Oral Tube size: 7.0 mm Number of attempts: 1 Airway Equipment and Method: Stylet and Video-laryngoscopy Placement Confirmation: ETT inserted through vocal cords under direct vision,  positive ETCO2,  breath sounds checked- equal and bilateral and CO2 detector Secured at: 21 cm Tube secured with: Tape Dental Injury: Teeth and Oropharynx as per pre-operative assessment

## 2020-04-27 NOTE — Op Note (Signed)
04/27/2020  1:31 PM  PATIENT:  Amy Rocha  65 y.o. female  PRE-OPERATIVE DIAGNOSIS:  Painful orthopaedic hardware T84.84XA left hip  POST-OPERATIVE DIAGNOSIS:  Painful orthopaedic hardware T84.84XA left hip  PROCEDURE:  Procedure(s): Left hip, deep hardware removal (Left)  SURGEON: Laurene Footman, MD  ASSISTANTS: None  ANESTHESIA:   general  EBL:  Total I/O In: 100 [IV Piggyback:100] Out: 10 [Blood:10]  BLOOD ADMINISTERED:none  DRAINS: none   LOCAL MEDICATIONS USED:  NONE  SPECIMEN:  No Specimen  DISPOSITION OF SPECIMEN:  N/A  COUNTS:  YES  TOURNIQUET:  * No tourniquets in log *  IMPLANTS: None  DICTATION: .Dragon Dictation patient was brought to the operating room and after adequate anesthesia was obtained she was placed in the lateral decubitus position with the beanbag holding her left side up.  After prepping and draping in the usual sterile fashion approximately 8 cm incision was centered over the greater trochanter skin and subcutaneous tissue incised with extensive edema in the subcutaneous tissue.  Tensor fascia was incised and there was some black granulation tissue consistent with metal against metal where the wires were exposed along with the trochanteric grip and wires cut posteriorly with removal of the entire grip.  The metallosis material was debrided with use of a rondure and the wound was thoroughly irrigated following this the deep fascia was closed with a running #1 Vicryl 2-0 Vicryl subcutaneously followed by skin staples Xeroform 4 x 4's and ABDs  PLAN OF CARE: Discharge to home after PACU  PATIENT DISPOSITION:  PACU - hemodynamically stable.

## 2020-04-27 NOTE — Discharge Instructions (Addendum)
Weightbearing as tolerated Leave dressing on until return visit keep clean and dry. Pain medicine has been sent to [place] to take in addition to your regular pain medicine. Call office if you are having problems. Okay to lie on the left side to minimize bleeding.   AMBULATORY SURGERY  DISCHARGE INSTRUCTIONS   1) The drugs that you were given will stay in your system until tomorrow so for the next 24 hours you should not:  A) Drive an automobile B) Make any legal decisions C) Drink any alcoholic beverage   2) You may resume regular meals tomorrow.  Today it is better to start with liquids and gradually work up to solid foods.  You may eat anything you prefer, but it is better to start with liquids, then soup and crackers, and gradually work up to solid foods.   3) Please notify your doctor immediately if you have any unusual bleeding, trouble breathing, redness and pain at the surgery site, drainage, fever, or pain not relieved by medication.  4) Your post-operative visit with Dr.                                     is: Date:                        Time:    Please call to schedule your post-operative visit.  5) Additional Instructions:

## 2020-04-27 NOTE — Transfer of Care (Signed)
Immediate Anesthesia Transfer of Care Note  Patient: Amy Rocha  Procedure(s) Performed: Left hip, deep hardware removal (Left )  Patient Location: PACU  Anesthesia Type:General  Level of Consciousness: awake, alert  and oriented  Airway & Oxygen Therapy: Patient Spontanous Breathing and Patient connected to face mask oxygen  Post-op Assessment: Report given to RN and Post -op Vital signs reviewed and stable  Post vital signs: Reviewed and stable  Last Vitals:  Vitals Value Taken Time  BP    Temp    Pulse    Resp    SpO2      Last Pain:  Vitals:   04/27/20 1101  TempSrc: Temporal  PainSc: 8          Complications: No complications documented.

## 2020-04-27 NOTE — Anesthesia Preprocedure Evaluation (Addendum)
Anesthesia Evaluation  Patient identified by MRN, date of birth, ID band Patient awake    History of Anesthesia Complications (+) PONV and history of anesthetic complications  Airway Mallampati: III  TM Distance: <3 FB     Dental  (+) Chipped, Caps   Pulmonary asthma , COPD, former smoker,    Pulmonary exam normal        Cardiovascular hypertension, Pt. on medications + CAD, + Peripheral Vascular Disease and +CHF  Normal cardiovascular exam     Neuro/Psych PSYCHIATRIC DISORDERS Anxiety Depression  Neuromuscular disease    GI/Hepatic GERD  ,  Endo/Other  negative endocrine ROS  Renal/GU Renal InsufficiencyRenal disease     Musculoskeletal  (+) Arthritis , Osteoarthritis,    Abdominal Normal abdominal exam  (+)   Peds  Hematology  (+) anemia ,   Anesthesia Other Findings   Reproductive/Obstetrics                             Anesthesia Physical  Anesthesia Plan  ASA: III  Anesthesia Plan: General   Post-op Pain Management:    Induction: Intravenous, Rapid sequence and Cricoid pressure planned  PONV Risk Score and Plan:   Airway Management Planned: Oral ETT and Video Laryngoscope Planned  Additional Equipment:   Intra-op Plan:   Post-operative Plan: Extubation in OR  Informed Consent: I have reviewed the patients History and Physical, chart, labs and discussed the procedure including the risks, benefits and alternatives for the proposed anesthesia with the patient or authorized representative who has indicated his/her understanding and acceptance.     Dental advisory given  Plan Discussed with: CRNA and Surgeon  Anesthesia Plan Comments:        Anesthesia Quick Evaluation

## 2020-04-28 ENCOUNTER — Encounter: Payer: Self-pay | Admitting: Orthopedic Surgery

## 2020-04-30 DIAGNOSIS — G894 Chronic pain syndrome: Secondary | ICD-10-CM | POA: Diagnosis not present

## 2020-05-02 NOTE — Anesthesia Postprocedure Evaluation (Signed)
Anesthesia Post Note  Patient: Amy Rocha  Procedure(s) Performed: Left hip, deep hardware removal (Left )  Patient location during evaluation: PACU Anesthesia Type: General Level of consciousness: awake and alert and oriented Pain management: pain level controlled Vital Signs Assessment: post-procedure vital signs reviewed and stable Respiratory status: spontaneous breathing Cardiovascular status: blood pressure returned to baseline Anesthetic complications: no   No complications documented.   Last Vitals:  Vitals:   04/27/20 1442 04/27/20 1447  BP: 102/69 126/68  Pulse: 82 91  Resp: 17 18  Temp:  (!) 36.1 C  SpO2: 96% 98%    Last Pain:  Vitals:   04/27/20 1447  TempSrc: Temporal  PainSc: 4                  Sahira Cataldi

## 2020-05-09 ENCOUNTER — Other Ambulatory Visit: Payer: Self-pay | Admitting: Family Medicine

## 2020-05-09 NOTE — Telephone Encounter (Signed)
Requested medication (s) are due for refill today: yes  Requested medication (s) are on the active medication list: yes  Last refill:  01/20/20  Future visit scheduled: n  Notes to clinic:  Please review for refill. Refill not delegated per protocol    Requested Prescriptions  Pending Prescriptions Disp Refills   ARIPiprazole (ABILIFY) 2 MG tablet [Pharmacy Med Name: ARIPIPRAZOLE 2 MG TABS 2 Tablet] 30 tablet 3    Sig: TAKE 1 TABLET BY MOUTH AT BEDTIME.      Not Delegated - Psychiatry:  Antipsychotics - Second Generation (Atypical) - aripiprazole Failed - 05/09/2020  4:20 PM      Failed - This refill cannot be delegated      Passed - Valid encounter within last 6 months    Recent Outpatient Visits           1 month ago Suspected COVID-19 virus infection   [place], [place], DO   2 months ago Suspected COVID-19 virus infection   [facility] [place], [place] P, DO   3 months ago Hypertensive chronic kidney disease, unspecified CKD stage   [facility] [place], Megan P, DO   4 months ago Primary hypertension   [place], [place], DO   9 months ago Sleep concern   [facility] Merrie Roof [place], [place]

## 2020-05-12 ENCOUNTER — Other Ambulatory Visit: Payer: Self-pay | Admitting: Family Medicine

## 2020-05-12 NOTE — Telephone Encounter (Signed)
Patient does not have future appointment scheduled. Cancelled last follow up.

## 2020-05-12 NOTE — Telephone Encounter (Signed)
Requested medication (s) are due for refill today: Yes  Requested medication (s) are on the active medication list: Yes  Last refill:  01/20/20  Future visit scheduled: No  Notes to clinic:  Unable to refill per protocol, cannot delegate. Last OV: 01/2020 with Dr. Wynetta Emery      Requested Prescriptions  Pending Prescriptions Disp Refills   ARIPiprazole (ABILIFY) 2 MG tablet [Pharmacy Med Name: ARIPIPRAZOLE 2 MG TABS 2 Tablet] 30 tablet 3    Sig: TAKE 1 TABLET BY MOUTH AT BEDTIME.      Not Delegated - Psychiatry:  Antipsychotics - Second Generation (Atypical) - aripiprazole Failed - 05/12/2020 12:10 PM      Failed - This refill cannot be delegated      Passed - Valid encounter within last 6 months    Recent Outpatient Visits           1 month ago Suspected COVID-19 virus infection   [place], [place], DO   2 months ago Suspected COVID-19 virus infection   [facility] [place], Megan P, DO   3 months ago Hypertensive chronic kidney disease, unspecified CKD stage   [facility] [place], Megan P, DO   4 months ago Primary hypertension   [place], [place], DO   9 months ago Sleep concern   [facility] Merrie Roof [place], [place]

## 2020-05-12 NOTE — Telephone Encounter (Signed)
Needs follow up with PCP

## 2020-05-12 NOTE — Telephone Encounter (Signed)
Called pt no answer left vm 

## 2020-05-12 NOTE — Telephone Encounter (Signed)
Possible duplicate 

## 2020-05-15 ENCOUNTER — Telehealth: Payer: Self-pay

## 2020-05-15 NOTE — Telephone Encounter (Signed)
Pt scheduled 3/30

## 2020-05-15 NOTE — Telephone Encounter (Signed)
Called pt because she was scheduled with Dr Wynetta Emery on 3/30 instead of her provider [name]. Changed appt over called pt left vm

## 2020-05-15 NOTE — Telephone Encounter (Signed)
Courtesy supply of 30 days sent for patient to make it to appointment.

## 2020-05-15 NOTE — Telephone Encounter (Signed)
Has appt with me, but needs one with PCP- can we see if we can get this switched?

## 2020-05-16 DIAGNOSIS — M25559 Pain in unspecified hip: Secondary | ICD-10-CM | POA: Diagnosis not present

## 2020-05-16 DIAGNOSIS — M79609 Pain in unspecified limb: Secondary | ICD-10-CM | POA: Diagnosis not present

## 2020-05-16 DIAGNOSIS — I739 Peripheral vascular disease, unspecified: Secondary | ICD-10-CM | POA: Diagnosis not present

## 2020-05-16 DIAGNOSIS — R519 Headache, unspecified: Secondary | ICD-10-CM | POA: Diagnosis not present

## 2020-05-16 DIAGNOSIS — M48062 Spinal stenosis, lumbar region with neurogenic claudication: Secondary | ICD-10-CM | POA: Diagnosis not present

## 2020-05-16 DIAGNOSIS — M792 Neuralgia and neuritis, unspecified: Secondary | ICD-10-CM | POA: Diagnosis not present

## 2020-05-16 DIAGNOSIS — M25519 Pain in unspecified shoulder: Secondary | ICD-10-CM | POA: Diagnosis not present

## 2020-05-16 DIAGNOSIS — G8929 Other chronic pain: Secondary | ICD-10-CM | POA: Diagnosis not present

## 2020-05-16 DIAGNOSIS — G894 Chronic pain syndrome: Secondary | ICD-10-CM | POA: Diagnosis not present

## 2020-05-16 DIAGNOSIS — M47897 Other spondylosis, lumbosacral region: Secondary | ICD-10-CM | POA: Diagnosis not present

## 2020-05-17 ENCOUNTER — Ambulatory Visit: Payer: Medicare Other | Admitting: Nurse Practitioner

## 2020-05-30 DIAGNOSIS — G894 Chronic pain syndrome: Secondary | ICD-10-CM | POA: Diagnosis not present

## 2020-06-13 DIAGNOSIS — G894 Chronic pain syndrome: Secondary | ICD-10-CM | POA: Diagnosis not present

## 2020-06-22 ENCOUNTER — Other Ambulatory Visit: Payer: Self-pay | Admitting: Nurse Practitioner

## 2020-06-22 ENCOUNTER — Other Ambulatory Visit: Payer: Self-pay | Admitting: Family Medicine

## 2020-06-22 NOTE — Telephone Encounter (Signed)
Lvm to schedule

## 2020-06-22 NOTE — Telephone Encounter (Signed)
Requested medication (s) are due for refill today - yes  Requested medication (s) are on the active medication list -yes  Future visit scheduled -no  Last refill: 05/15/20  Notes to clinic: Request non delegated Rx  Requested Prescriptions  Pending Prescriptions Disp Refills   ARIPiprazole (ABILIFY) 2 MG tablet [Pharmacy Med Name: ARIPIPRAZOLE 2 MG TABS 2 Tablet] 30 tablet 0    Sig: TAKE 1 TABLET BY MOUTH AT BEDTIME.      Not Delegated - Psychiatry:  Antipsychotics - Second Generation (Atypical) - aripiprazole Failed - 06/22/2020  3:54 PM      Failed - This refill cannot be delegated      Passed - Valid encounter within last 6 months    Recent Outpatient Visits           2 months ago Suspected COVID-19 virus infection   [name], Megan P, DO   4 months ago Suspected COVID-19 virus infection   [facility] [employer], [place], DO   5 months ago Hypertensive chronic kidney disease, unspecified CKD stage   [facility] [place], Megan P, DO   6 months ago Primary hypertension   [place], [place], DO   10 months ago Sleep concern   [facility] [place], [place]                    Requested Prescriptions  Pending Prescriptions Disp Refills   ARIPiprazole (ABILIFY) 2 MG tablet [Pharmacy Med Name: ARIPIPRAZOLE 2 MG TABS 2 Tablet] 30 tablet 0    Sig: TAKE 1 TABLET BY MOUTH AT BEDTIME.      Not Delegated - Psychiatry:  Antipsychotics - Second Generation (Atypical) - aripiprazole Failed - 06/22/2020  3:54 PM      Failed - This refill cannot be delegated      Passed - Valid encounter within last 6 months    Recent Outpatient Visits           2 months ago Suspected COVID-19 virus infection   [place], [place] P, DO   4 months ago Suspected COVID-19 virus infection   [facility] [place], [place], DO   5 months ago Hypertensive chronic kidney  disease, unspecified CKD stage   [facility] [place], Megan P, DO   6 months ago Primary hypertension   [place], [place], DO   10 months ago Sleep concern   [place], [place], [place]

## 2020-06-23 NOTE — Telephone Encounter (Signed)
Lvm to make this apt. 

## 2020-06-26 ENCOUNTER — Encounter: Payer: Self-pay | Admitting: Nurse Practitioner

## 2020-06-26 NOTE — Telephone Encounter (Signed)
FYI have called [facility] Sent letter.

## 2020-06-26 NOTE — Telephone Encounter (Signed)
Letter sent.

## 2020-06-26 NOTE — Telephone Encounter (Signed)
Lvm to make this apt. 

## 2020-06-28 ENCOUNTER — Ambulatory Visit: Payer: Self-pay | Admitting: *Deleted

## 2020-06-28 NOTE — Telephone Encounter (Signed)
  Patient called to report c/o earache , sore throat and pain when swallowing. Denies fever, headache, cough. Has not tested for covid. Patient reports she has had ear pain x 1 week. Patient calling to report symptoms and appt was cancelled due to symptoms for 07/06/20. Patient reports she needed to come to appt to have medications renewed. Patient reports she is out of tintellix and Abilify. Patient is going to call pharmacy to see if she has a prescription ready for pick up. Attempted to make patient virtual visit and earliest appt is 07/06/20. Patient reports she needs assistance before then. Patient encouraged to monitor for fever. Call back for appt and to drinks warm fluids for sore throat . If swallowing gets worse, pain in ear or drainage or fever noted please go to UC or ED of symptoms worsen. Please advise if earlier appt available . Recommended to request medications for refill and patient reports she will call her pharmacy.   Answer Assessment - Initial Assessment Questions 1. LOCATION: "Which ear is involved?"     [place]  2. ONSET: "When did the ear start hurting"      One week ago  3. SEVERITY: "How bad is the pain?"  (Scale 1-10; mild, moderate or severe)   - MILD (1-3): doesn't interfere with normal activities    - MODERATE (4-7): interferes with normal activities or awakens from sleep    - SEVERE (8-10): excruciating pain, unable to do any normal activities      Na  4. URI SYMPTOMS: "Do you have a runny nose or cough?"     No  5. FEVER: "Do you have a fever?" If Yes, ask: "What is your temperature, how was it measured, and when did it start?"     denies 6. CAUSE: "Have you been swimming recently?", "How often do you use Q-TIPS?", "Have you had any recent air travel or scuba diving?"     *No Answer* 7. OTHER SYMPTOMS: "Do you have any other symptoms?" (e.g., headache, stiff neck, dizziness, vomiting, runny nose, decreased hearing)     *No Answer* 8. PREGNANCY: "Is there any chance you  are pregnant?" "When was your last menstrual period?"     *No Answer*  Protocols used: EARACHE-A-AH

## 2020-06-29 ENCOUNTER — Ambulatory Visit: Payer: Medicare Other | Admitting: Nurse Practitioner

## 2020-06-29 NOTE — Telephone Encounter (Signed)
If pt calls back please let her know provider wants her to come in

## 2020-06-29 NOTE — Telephone Encounter (Signed)
Please advise 

## 2020-06-29 NOTE — Telephone Encounter (Signed)
IF pt calls please reaschule apt on 06/30/2020 as provider will not be in office and she would like to see her in office for this.

## 2020-06-29 NOTE — Telephone Encounter (Signed)
Pt stated for her sore throat and ear pain did you want her to be in office?

## 2020-06-29 NOTE — Telephone Encounter (Signed)
Probably should be in office so I can evaluate her ear.

## 2020-06-29 NOTE — Telephone Encounter (Signed)
Pt has apt for phone call only on 06/30/2020 as she was unable to schedule for today.

## 2020-06-30 ENCOUNTER — Ambulatory Visit: Payer: Medicare Other | Admitting: Nurse Practitioner

## 2020-07-07 DIAGNOSIS — H2513 Age-related nuclear cataract, bilateral: Secondary | ICD-10-CM | POA: Diagnosis not present

## 2020-07-11 DIAGNOSIS — G894 Chronic pain syndrome: Secondary | ICD-10-CM | POA: Diagnosis not present

## 2020-07-29 DIAGNOSIS — G894 Chronic pain syndrome: Secondary | ICD-10-CM | POA: Diagnosis not present

## 2020-07-31 ENCOUNTER — Ambulatory Visit: Payer: Medicare Other | Admitting: Nurse Practitioner

## 2020-08-08 DIAGNOSIS — M259 Joint disorder, unspecified: Secondary | ICD-10-CM | POA: Diagnosis not present

## 2020-08-08 DIAGNOSIS — M5136 Other intervertebral disc degeneration, lumbar region: Secondary | ICD-10-CM | POA: Diagnosis not present

## 2020-08-08 DIAGNOSIS — M545 Low back pain, unspecified: Secondary | ICD-10-CM | POA: Diagnosis not present

## 2020-08-08 DIAGNOSIS — G894 Chronic pain syndrome: Secondary | ICD-10-CM | POA: Diagnosis not present

## 2020-08-10 ENCOUNTER — Ambulatory Visit (INDEPENDENT_AMBULATORY_CARE_PROVIDER_SITE_OTHER): Payer: Medicare Other | Admitting: Nurse Practitioner

## 2020-08-10 ENCOUNTER — Encounter: Payer: Self-pay | Admitting: Nurse Practitioner

## 2020-08-10 VITALS — BP 127/77 | HR 93

## 2020-08-10 DIAGNOSIS — K219 Gastro-esophageal reflux disease without esophagitis: Secondary | ICD-10-CM | POA: Diagnosis not present

## 2020-08-10 MED ORDER — ONDANSETRON 4 MG PO TBDP: 4.0000 mg | ORAL_TABLET | Freq: Three times a day (TID) | ORAL | 6 refills | Status: DC | PRN

## 2020-08-10 MED ORDER — VALACYCLOVIR HCL 500 MG PO TABS: 2000.0000 mg | ORAL_TABLET | Freq: Two times a day (BID) | ORAL | 1 refills | Status: DC | PRN

## 2020-08-10 MED ORDER — OMEPRAZOLE 20 MG PO CPDR: 20.0000 mg | DELAYED_RELEASE_CAPSULE | Freq: Every day | ORAL | 1 refills | Status: DC

## 2020-08-10 NOTE — Progress Notes (Signed)
   BP 127/77   Pulse 93   LMP 09/13/1975 (Approximate)    Subjective:    Patient ID: Amy Rocha, female    DOB: October 09, 1955, 65 y.o.   MRN: [id number]  HPI: Amy Rocha is a 65 y.o. female  Chief Complaint  Patient presents with   Medication Refill   Patient seen for telephone visit due to medication refill.  Patient is overdue for follow up and blood work in the office. Patient is requesting refills on Omeprazole, Zofran, and valtrex.  Patient denies other concerns at visit today.    Denies HA, CP, SOB, dizziness, palpitations, visual changes, and lower extremity swelling.   Relevant past medical, surgical, family and social history reviewed and updated as indicated. Interim medical history since our last visit reviewed. Allergies and medications reviewed and updated.  Review of Systems  Eyes:  Negative for visual disturbance.  Respiratory:  Negative for cough, chest tightness and shortness of breath.   Cardiovascular:  Negative for chest pain, palpitations and leg swelling.  Neurological:  Negative for dizziness and headaches.   Per HPI unless specifically indicated above     Objective:    BP 127/77   Pulse 93   LMP 09/13/1975 (Approximate)   Wt Readings from Last 3 Encounters:  04/19/20 160 lb (72.6 kg)  01/20/20 152 lb 9.6 oz (69.2 kg)  12/21/19 153 lb (69.4 kg)    Physical Exam Vitals and nursing note reviewed.  Pulmonary:     Effort: Pulmonary effort is normal. No respiratory distress.  Neurological:     Mental Status: She is alert.  Psychiatric:        Mood and Affect: Mood normal.        Behavior: Behavior normal.        Thought Content: Thought content normal.        Judgment: Judgment normal.    Results for orders placed or performed during the hospital encounter of 04/25/20  SARS CORONAVIRUS 2 (TAT 6-24 HRS) Nasopharyngeal Nasopharyngeal Swab   Specimen: Nasopharyngeal Swab  Result Value Ref Range   SARS Coronavirus 2 NEGATIVE NEGATIVE       Assessment & Plan:   Problem List Items Addressed This Visit       Digestive   GERD (gastroesophageal reflux disease) - Primary    Chronic.  Ongoing.  Refill sent during visit today. Follow up in office in 1 month.       Relevant Medications   ondansetron (ZOFRAN-ODT) 4 MG disintegrating tablet   omeprazole (PRILOSEC) 20 MG capsule     Follow up plan: Return in about 1 month (around 09/09/2020) for Chronic condition follow up in office.    This visit was completed via MyChart due to the restrictions of the COVID-19 pandemic. All issues as above were discussed and addressed. Physical exam was done as above through visual confirmation on MyChart. If it was felt that the patient should be evaluated in the office, they were directed there. The patient verbally consented to this visit. Location of the patient: Home Location of the provider: Office Those involved with this call:  Provider: Jon Billings, NP CMA: Tiffany Reel, CMA Front Desk/Registration: Jill Side Time spent on call: 15 minutes with patient face to face via video conference. More than 50% of this time was spent in counseling and coordination of care. 20 minutes total spent in review of patient's record and preparation of their chart.

## 2020-08-11 NOTE — Assessment & Plan Note (Signed)
Chronic.  Ongoing.  Refill sent during visit today. Follow up in office in 1 month.

## 2020-08-22 ENCOUNTER — Other Ambulatory Visit: Payer: Self-pay | Admitting: Family Medicine

## 2020-08-28 DIAGNOSIS — M5136 Other intervertebral disc degeneration, lumbar region: Secondary | ICD-10-CM | POA: Diagnosis not present

## 2020-08-28 DIAGNOSIS — G894 Chronic pain syndrome: Secondary | ICD-10-CM | POA: Diagnosis not present

## 2020-08-30 DIAGNOSIS — T8484XA Pain due to internal orthopedic prosthetic devices, implants and grafts, initial encounter: Secondary | ICD-10-CM | POA: Diagnosis not present

## 2020-08-30 DIAGNOSIS — Z96643 Presence of artificial hip joint, bilateral: Secondary | ICD-10-CM | POA: Diagnosis not present

## 2020-08-31 ENCOUNTER — Other Ambulatory Visit (HOSPITAL_COMMUNITY): Payer: Self-pay | Admitting: Orthopedic Surgery

## 2020-08-31 ENCOUNTER — Other Ambulatory Visit: Payer: Self-pay | Admitting: Orthopedic Surgery

## 2020-08-31 DIAGNOSIS — Z96643 Presence of artificial hip joint, bilateral: Secondary | ICD-10-CM

## 2020-08-31 DIAGNOSIS — T8484XA Pain due to internal orthopedic prosthetic devices, implants and grafts, initial encounter: Secondary | ICD-10-CM

## 2020-09-05 DIAGNOSIS — G894 Chronic pain syndrome: Secondary | ICD-10-CM | POA: Diagnosis not present

## 2020-09-05 DIAGNOSIS — M259 Joint disorder, unspecified: Secondary | ICD-10-CM | POA: Diagnosis not present

## 2020-09-05 DIAGNOSIS — M545 Low back pain, unspecified: Secondary | ICD-10-CM | POA: Diagnosis not present

## 2020-09-05 DIAGNOSIS — M5136 Other intervertebral disc degeneration, lumbar region: Secondary | ICD-10-CM | POA: Diagnosis not present

## 2020-09-07 ENCOUNTER — Other Ambulatory Visit: Payer: Self-pay | Admitting: Family Medicine

## 2020-09-20 ENCOUNTER — Other Ambulatory Visit: Payer: Self-pay

## 2020-09-20 ENCOUNTER — Ambulatory Visit (INDEPENDENT_AMBULATORY_CARE_PROVIDER_SITE_OTHER): Payer: Medicare Other | Admitting: Nurse Practitioner

## 2020-09-20 ENCOUNTER — Encounter: Payer: Self-pay | Admitting: Nurse Practitioner

## 2020-09-20 VITALS — BP 142/87 | HR 79 | Temp 97.6°F | Wt 159.4 lb

## 2020-09-20 DIAGNOSIS — I10 Essential (primary) hypertension: Secondary | ICD-10-CM | POA: Diagnosis not present

## 2020-09-20 DIAGNOSIS — E559 Vitamin D deficiency, unspecified: Secondary | ICD-10-CM

## 2020-09-20 DIAGNOSIS — N184 Chronic kidney disease, stage 4 (severe): Secondary | ICD-10-CM | POA: Diagnosis not present

## 2020-09-20 DIAGNOSIS — E782 Mixed hyperlipidemia: Secondary | ICD-10-CM

## 2020-09-20 DIAGNOSIS — J449 Chronic obstructive pulmonary disease, unspecified: Secondary | ICD-10-CM

## 2020-09-20 DIAGNOSIS — I509 Heart failure, unspecified: Secondary | ICD-10-CM | POA: Diagnosis not present

## 2020-09-20 NOTE — Assessment & Plan Note (Signed)
Labs ordered today.  Will make recommendations based on lab results. Continue with Atorvastatin 40mg  daily.

## 2020-09-20 NOTE — Assessment & Plan Note (Signed)
Labs ordered today.  Will make recommendations based on lab results. ?

## 2020-09-20 NOTE — Assessment & Plan Note (Signed)
Chronic.  Controlled.  Continue with current medication regimen.  Labs ordered today. Does not see Cardiology.  Return to clinic in 6 months for reevaluation.  Call sooner if concerns arise.   

## 2020-09-20 NOTE — Progress Notes (Signed)
 BP (!) 142/87   Pulse 79   Temp 97.6 F (36.4 C) (Oral)   Wt 159 lb 6.4 oz (72.3 kg)   LMP 09/13/1975 (Approximate)   SpO2 98%   BMI 27.36 kg/m    Subjective:    Patient ID: Amy Rocha, female    DOB: 09/04/1955, 65 y.o.   MRN: [id number]  HPI: Amy Rocha is a 65 y.o. female  Chief Complaint  Patient presents with   Hypertension    Patient denies having any questions or concerns at today's visit.    HYPERTENSION / HYPERLIPIDEMIA Satisfied with current treatment? yes Duration of hypertension: years BP monitoring frequency: not checking BP range:  BP medication side effects: no Past BP meds: losartan (cozaar) Duration of hyperlipidemia: years Cholesterol medication side effects: no Cholesterol supplements: none Past cholesterol medications: atorvastain (lipitor) Medication compliance: excellent compliance Aspirin: no Recent stressors: no Recurrent headaches: no Visual changes: no Palpitations: no Dyspnea: no Chest pain: no Lower extremity edema: no Dizzy/lightheaded: no  COPD COPD status: controlled Satisfied with current treatment?: yes Oxygen use: no Dyspnea frequency: no Cough frequency: no Rescue inhaler frequency:   Limitation of activity: no Productive cough:  Last Spirometry:  Pneumovax: Up to Date Influenza: Up to Date  CHRONIC KIDNEY DISEASE CKD status: controlled Medications renally dose: no Previous renal evaluation: no Pneumovax:  Up to Date Influenza Vaccine:  Up to Date  Relevant past medical, surgical, family and social history reviewed and updated as indicated. Interim medical history since our last visit reviewed. Allergies and medications reviewed and updated.  Review of Systems  Eyes:  Negative for visual disturbance.  Respiratory:  Negative for cough, chest tightness and shortness of breath.   Cardiovascular:  Negative for chest pain, palpitations and leg swelling.  Neurological:  Negative for dizziness and  headaches.   Per HPI unless specifically indicated above     Objective:    BP (!) 142/87   Pulse 79   Temp 97.6 F (36.4 C) (Oral)   Wt 159 lb 6.4 oz (72.3 kg)   LMP 09/13/1975 (Approximate)   SpO2 98%   BMI 27.36 kg/m   Wt Readings from Last 3 Encounters:  09/20/20 159 lb 6.4 oz (72.3 kg)  04/19/20 160 lb (72.6 kg)  01/20/20 152 lb 9.6 oz (69.2 kg)    Physical Exam Vitals and nursing note reviewed.  Constitutional:      General: She is not in acute distress.    Appearance: Normal appearance. She is normal weight. She is not ill-appearing, toxic-appearing or diaphoretic.  HENT:     Head: Normocephalic.     Right Ear: External ear normal.     Left Ear: External ear normal.     Nose: Nose normal.     Mouth/Throat:     Mouth: Mucous membranes are moist.     Pharynx: Oropharynx is clear.  Eyes:     General:        Right eye: No discharge.        Left eye: No discharge.     Extraocular Movements: Extraocular movements intact.     Conjunctiva/sclera: Conjunctivae normal.     Pupils: Pupils are equal, round, and reactive to light.  Cardiovascular:     Rate and Rhythm: Normal rate and regular rhythm.     Heart sounds: No murmur heard. Pulmonary:     Effort: Pulmonary effort is normal. No respiratory distress.     Breath sounds: Normal breath sounds. No wheezing or   rales.  Musculoskeletal:     Cervical back: Normal range of motion and neck supple.  Skin:    General: Skin is warm and dry.     Capillary Refill: Capillary refill takes less than 2 seconds.  Neurological:     General: No focal deficit present.     Mental Status: She is alert and oriented to person, place, and time. Mental status is at baseline.  Psychiatric:        Mood and Affect: Mood normal.        Behavior: Behavior normal.        Thought Content: Thought content normal.        Judgment: Judgment normal.    Results for orders placed or performed during the hospital encounter of 04/25/20  SARS  CORONAVIRUS 2 (TAT 6-24 HRS) Nasopharyngeal Nasopharyngeal Swab   Specimen: Nasopharyngeal Swab  Result Value Ref Range   SARS Coronavirus 2 NEGATIVE NEGATIVE      Assessment & Plan:   Problem List Items Addressed This Visit       Cardiovascular and Mediastinum   CHF (congestive heart failure) ([place]) - Primary    Chronic.  Controlled.  Continue with current medication regimen.  Labs ordered today. Does not see Cardiology.  Return to clinic in 6 months for reevaluation.  Call sooner if concerns arise.         Relevant Orders   Comp Met (CMET)   Hypertension    Chronic.  Controlled.  Continue with current medication regimen.  Labs ordered today.  Return to clinic in 6 months for reevaluation.  Call sooner if concerns arise.         Relevant Orders   Comp Met (CMET)     Respiratory   COPD (chronic obstructive pulmonary disease) (H[facility])    Chronic.  Controlled.  Continue with current medication regimen.  Labs ordered today.  Return to clinic in 6 months for reevaluation.  Can consider spirometry in the future if symptoms worsen.  Call sooner if concerns arise.         Relevant Orders   Comp Met (CMET)     Genitourinary   CKD (chronic kidney disease), stage IV (HCC)    Chronic.  Controlled.  Continue with current medication regimen.  Labs ordered today.  Return to clinic in 6 months for reevaluation.  Call sooner if concerns arise.           Other   Hyperlipidemia    Labs ordered today.  Will make recommendations based on lab results. Continue with Atorvastatin 16m daily.       Relevant Orders   Lipid Profile   Vitamin D deficiency    Labs ordered today. Will make recommendations based on lab results.        Relevant Orders   Vitamin D (25 hydroxy)     Follow up plan: Return in about 6 months (around 03/23/2021) for Physical and Fasting labs.

## 2020-09-20 NOTE — Assessment & Plan Note (Signed)
Chronic.  Controlled.  Continue with current medication regimen.  Labs ordered today.  Return to clinic in 6 months for reevaluation.  Can consider spirometry in the future if symptoms worsen.  Call sooner if concerns arise.

## 2020-09-20 NOTE — Assessment & Plan Note (Signed)
Chronic.  Controlled.  Continue with current medication regimen.  Labs ordered today.  Return to clinic in 6 months for reevaluation.  Call sooner if concerns arise.  ? ?

## 2020-09-21 ENCOUNTER — Encounter (HOSPITAL_COMMUNITY): Admission: RE | Admit: 2020-09-21 | Payer: Medicare Other | Source: Ambulatory Visit

## 2020-09-21 ENCOUNTER — Encounter (HOSPITAL_COMMUNITY): Payer: Medicare Other

## 2020-09-21 LAB — LIPID PANEL
Chol/HDL Ratio: 2.4 ratio (ref 0.0–4.4)
Cholesterol, Total: 177 mg/dL (ref 100–199)
HDL: 75 mg/dL (ref >39)
LDL Chol Calc (NIH): 83 mg/dL (ref 0–99)
Triglycerides: 109 mg/dL (ref 0–149)
VLDL Cholesterol Cal: 19 mg/dL (ref 5–40)

## 2020-09-21 LAB — COMPREHENSIVE METABOLIC PANEL
ALT: 25 IU/L (ref 0–32)
AST: 35 IU/L (ref 0–40)
Albumin/Globulin Ratio: 1.7 (ref 1.2–2.2)
Albumin: 4.2 g/dL (ref 3.8–4.8)
Alkaline Phosphatase: 100 IU/L (ref 44–121)
BUN/Creatinine Ratio: 8 — ABNORMAL LOW (ref 12–28)
BUN: 8 mg/dL (ref 8–27)
Bilirubin Total: 0.4 mg/dL (ref 0.0–1.2)
CO2: 22 mmol/L (ref 20–29)
Calcium: 9.2 mg/dL (ref 8.7–10.3)
Chloride: 105 mmol/L (ref 96–106)
Creatinine, Ser: 1.04 mg/dL — ABNORMAL HIGH (ref 0.57–1.00)
Globulin, Total: 2.5 g/dL (ref 1.5–4.5)
Glucose: 84 mg/dL (ref 65–99)
Potassium: 4.7 mmol/L (ref 3.5–5.2)
Sodium: 141 mmol/L (ref 134–144)
Total Protein: 6.7 g/dL (ref 6.0–8.5)
eGFR: 59 mL/min/{1.73_m2} — ABNORMAL LOW (ref >59)

## 2020-09-21 LAB — VITAMIN D 25 HYDROXY (VIT D DEFICIENCY, FRACTURES): Vit D, 25-Hydroxy: 43.6 ng/mL (ref 30.0–100.0)

## 2020-09-21 NOTE — Progress Notes (Signed)
Hi [name].  Your lab work looks good.  Kidney function remains stable.  Vitamin D is within normal range.  Follow up as discussed.

## 2020-09-26 ENCOUNTER — Other Ambulatory Visit: Payer: Self-pay | Admitting: Family Medicine

## 2020-09-27 DIAGNOSIS — G894 Chronic pain syndrome: Secondary | ICD-10-CM | POA: Diagnosis not present

## 2020-10-03 DIAGNOSIS — G894 Chronic pain syndrome: Secondary | ICD-10-CM | POA: Diagnosis not present

## 2020-10-03 DIAGNOSIS — M259 Joint disorder, unspecified: Secondary | ICD-10-CM | POA: Diagnosis not present

## 2020-10-03 DIAGNOSIS — M545 Low back pain, unspecified: Secondary | ICD-10-CM | POA: Diagnosis not present

## 2020-10-03 DIAGNOSIS — M5136 Other intervertebral disc degeneration, lumbar region: Secondary | ICD-10-CM | POA: Diagnosis not present

## 2020-10-11 ENCOUNTER — Encounter
Admission: RE | Admit: 2020-10-11 | Discharge: 2020-10-11 | Disposition: A | Discharge status: Home / Self Care | Payer: Medicare Other | Source: Ambulatory Visit | Attending: Orthopedic Surgery | Admitting: Orthopedic Surgery

## 2020-10-11 ENCOUNTER — Other Ambulatory Visit: Payer: Self-pay

## 2020-10-11 DIAGNOSIS — T8484XA Pain due to internal orthopedic prosthetic devices, implants and grafts, initial encounter: Secondary | ICD-10-CM | POA: Diagnosis not present

## 2020-10-11 DIAGNOSIS — Z96643 Presence of artificial hip joint, bilateral: Secondary | ICD-10-CM | POA: Diagnosis not present

## 2020-10-11 DIAGNOSIS — M25552 Pain in left hip: Secondary | ICD-10-CM | POA: Diagnosis not present

## 2020-10-11 MED ORDER — TECHNETIUM TC 99M MEDRONATE IV KIT
30.0000 | PACK | Freq: Once | INTRAVENOUS | Status: AC | PRN
  Administered 2020-10-11: 20.82 via INTRAVENOUS

## 2020-10-27 DIAGNOSIS — G894 Chronic pain syndrome: Secondary | ICD-10-CM | POA: Diagnosis not present

## 2020-10-27 DIAGNOSIS — M259 Joint disorder, unspecified: Secondary | ICD-10-CM | POA: Diagnosis not present

## 2020-10-31 DIAGNOSIS — M259 Joint disorder, unspecified: Secondary | ICD-10-CM | POA: Diagnosis not present

## 2020-10-31 DIAGNOSIS — M545 Low back pain, unspecified: Secondary | ICD-10-CM | POA: Diagnosis not present

## 2020-10-31 DIAGNOSIS — M5136 Other intervertebral disc degeneration, lumbar region: Secondary | ICD-10-CM | POA: Diagnosis not present

## 2020-10-31 DIAGNOSIS — G894 Chronic pain syndrome: Secondary | ICD-10-CM | POA: Diagnosis not present

## 2020-11-14 ENCOUNTER — Telehealth: Payer: Self-pay | Admitting: Nurse Practitioner

## 2020-11-14 NOTE — Telephone Encounter (Signed)
Left message for patient to call back and schedule the Medicare Annual Wellness Visit (AWV) virtually or by telephone.  Last AWV 06/23/2019  Please schedule at anytime with [facility]-Nurse Health Advisor.  45 minute appointment  Any questions, please call me at [phone]

## 2020-11-26 DIAGNOSIS — M545 Low back pain, unspecified: Secondary | ICD-10-CM | POA: Diagnosis not present

## 2020-11-26 DIAGNOSIS — G894 Chronic pain syndrome: Secondary | ICD-10-CM | POA: Diagnosis not present

## 2020-11-28 DIAGNOSIS — M169 Osteoarthritis of hip, unspecified: Secondary | ICD-10-CM | POA: Diagnosis not present

## 2020-11-28 DIAGNOSIS — M259 Joint disorder, unspecified: Secondary | ICD-10-CM | POA: Diagnosis not present

## 2020-11-28 DIAGNOSIS — G894 Chronic pain syndrome: Secondary | ICD-10-CM | POA: Diagnosis not present

## 2020-11-28 DIAGNOSIS — M5136 Other intervertebral disc degeneration, lumbar region: Secondary | ICD-10-CM | POA: Diagnosis not present

## 2020-11-29 ENCOUNTER — Other Ambulatory Visit: Payer: Self-pay | Admitting: Family Medicine

## 2020-11-29 NOTE — Telephone Encounter (Signed)
Requested Prescriptions  Pending Prescriptions Disp Refills  . losartan (COZAAR) 25 MG tablet [Pharmacy Med Name: LOSARTAN POTASSIUM 25 MG TA 25 Tablet] 90 tablet 1    Sig: TAKE 1 TABLET (25 MG TOTAL) BY MOUTH DAILY.     Cardiovascular:  Angiotensin Receptor Blockers Failed - 11/29/2020 12:45 PM      Failed - Cr in normal range and within 180 days    Creatinine  Date Value Ref Range Status  09/14/2013 0.88 0.60 - 1.30 mg/dL Final   Creatinine, Ser  Date Value Ref Range Status  09/20/2020 1.04 (H) 0.57 - 1.00 mg/dL Final         Failed - Last BP in normal range    BP Readings from Last 1 Encounters:  09/20/20 (!) 142/87         Passed - K in normal range and within 180 days    Potassium  Date Value Ref Range Status  09/20/2020 4.7 3.5 - 5.2 mmol/L Final  09/14/2013 3.7 3.5 - 5.1 mmol/L Final         Passed - Patient is not pregnant      Passed - Valid encounter within last 6 months    Recent Outpatient Visits          2 months ago Chronic congestive heart failure, unspecified heart failure type ([place])   [facility] Jon Billings, NP   3 months ago Gastroesophageal reflux disease, unspecified whether esophagitis present   [facility] Jon Billings, NP   7 months ago Suspected COVID-19 virus infection   [facility] [place], DO   9 months ago Suspected COVID-19 virus infection   [facility] Johnson, [place], DO   10 months ago Hypertensive chronic kidney disease, unspecified CKD stage   [facility] Valerie Roys, DO      Future Appointments            In 3 months Jon Billings, NP [facility], [place]

## 2020-12-06 ENCOUNTER — Other Ambulatory Visit: Payer: Self-pay | Admitting: Family Medicine

## 2020-12-26 DIAGNOSIS — G8929 Other chronic pain: Secondary | ICD-10-CM | POA: Diagnosis not present

## 2020-12-26 DIAGNOSIS — G894 Chronic pain syndrome: Secondary | ICD-10-CM | POA: Diagnosis not present

## 2020-12-26 DIAGNOSIS — Z79891 Long term (current) use of opiate analgesic: Secondary | ICD-10-CM | POA: Diagnosis not present

## 2020-12-26 DIAGNOSIS — R519 Headache, unspecified: Secondary | ICD-10-CM | POA: Diagnosis not present

## 2020-12-26 DIAGNOSIS — M25559 Pain in unspecified hip: Secondary | ICD-10-CM | POA: Diagnosis not present

## 2020-12-26 DIAGNOSIS — M259 Joint disorder, unspecified: Secondary | ICD-10-CM | POA: Diagnosis not present

## 2020-12-26 DIAGNOSIS — M5136 Other intervertebral disc degeneration, lumbar region: Secondary | ICD-10-CM | POA: Diagnosis not present

## 2020-12-26 DIAGNOSIS — M792 Neuralgia and neuritis, unspecified: Secondary | ICD-10-CM | POA: Diagnosis not present

## 2020-12-26 DIAGNOSIS — M9951 Intervertebral disc stenosis of neural canal of cervical region: Secondary | ICD-10-CM | POA: Diagnosis not present

## 2020-12-26 DIAGNOSIS — M47897 Other spondylosis, lumbosacral region: Secondary | ICD-10-CM | POA: Diagnosis not present

## 2020-12-26 DIAGNOSIS — M48062 Spinal stenosis, lumbar region with neurogenic claudication: Secondary | ICD-10-CM | POA: Diagnosis not present

## 2020-12-28 ENCOUNTER — Other Ambulatory Visit: Payer: Self-pay | Admitting: Family Medicine

## 2020-12-28 NOTE — Telephone Encounter (Signed)
Requested Prescriptions  Pending Prescriptions Disp Refills  . buPROPion (WELLBUTRIN XL) 300 MG 24 hr tablet [Pharmacy Med Name: BUPROPION HCL XL 300 MG TAB 300 Tablet] 90 tablet 1    Sig: TAKE 1 TABLET BY MOUTH IN THE MORNING.     Psychiatry: Antidepressants - bupropion Failed - 12/28/2020  9:36 AM      Failed - Last BP in normal range    BP Readings from Last 1 Encounters:  09/20/20 (!) 142/87         Passed - Completed PHQ-2 or PHQ-9 in the last 360 days      Passed - Valid encounter within last 6 months    Recent Outpatient Visits          3 months ago Chronic congestive heart failure, unspecified heart failure type [facility])   [facility] Jon Billings, NP   4 months ago Gastroesophageal reflux disease, unspecified whether esophagitis present   [facility] Jon Billings, NP   8 months ago Suspected COVID-19 virus infection   [facility], [place], DO   10 months ago Suspected COVID-19 virus infection   [facility], [place], DO   11 months ago Hypertensive chronic kidney disease, unspecified CKD stage   [facility] Valerie Roys, DO      Future Appointments            In 2 months Jon Billings, NP [facility], [place]

## 2021-01-15 ENCOUNTER — Other Ambulatory Visit: Payer: Self-pay | Admitting: Family Medicine

## 2021-01-16 ENCOUNTER — Ambulatory Visit (INDEPENDENT_AMBULATORY_CARE_PROVIDER_SITE_OTHER): Payer: Medicare Other | Admitting: *Deleted

## 2021-01-16 ENCOUNTER — Other Ambulatory Visit: Payer: Self-pay

## 2021-01-16 DIAGNOSIS — Z780 Asymptomatic menopausal state: Secondary | ICD-10-CM | POA: Diagnosis not present

## 2021-01-16 DIAGNOSIS — Z1231 Encounter for screening mammogram for malignant neoplasm of breast: Secondary | ICD-10-CM | POA: Diagnosis not present

## 2021-01-16 DIAGNOSIS — Z0000 Encounter for general adult medical examination without abnormal findings: Secondary | ICD-10-CM

## 2021-01-16 NOTE — Patient Instructions (Signed)
[name] , Thank you for taking time to come for your Medicare Wellness Visit. I appreciate your ongoing commitment to your health goals. Please review the following plan we discussed and let me know if I can assist you in the future.   Screening recommendations/referrals: Colonoscopy: up to date Mammogram: Education provided Bone Density: Education provided Recommended yearly ophthalmology/optometry visit for glaucoma screening and checkup Recommended yearly dental visit for hygiene and checkup  Vaccinations: Influenza vaccine: up to date Pneumococcal vaccine: up to date Tdap vaccine: up to date Shingles vaccine: up to date    Advanced directives: Education provided  Conditions/risks identified:   Next appointment: 03-26-2021 @ 10:00 [facility] 65 Years and Older, Female Preventive care refers to lifestyle choices and visits with your health care provider that can promote health and wellness. What does preventive care include? A yearly physical exam. This is also called an annual well check. Dental exams once or twice a year. Routine eye exams. Ask your health care provider how often you should have your eyes checked. Personal lifestyle choices, including: Daily care of your teeth and gums. Regular physical activity. Eating a healthy diet. Avoiding tobacco and drug use. Limiting alcohol use. Practicing safe sex. Taking low-dose aspirin every day. Taking vitamin and mineral supplements as recommended by your health care provider. What happens during an annual well check? The services and screenings done by your health care provider during your annual well check will depend on your age, overall health, lifestyle risk factors, and family history of disease. Counseling  Your health care provider may ask you questions about your: Alcohol use. Tobacco use. Drug use. Emotional well-being. Home and relationship well-being. Sexual activity. Eating  habits. History of falls. Memory and ability to understand (cognition). Work and work Statistician. Reproductive health. Screening  You may have the following tests or measurements: Height, weight, and BMI. Blood pressure. Lipid and cholesterol levels. These may be checked every 5 years, or more frequently if you are over 108 years old. Skin check. Lung cancer screening. You may have this screening every year starting at age 67 if you have a 30-pack-year history of smoking and currently smoke or have quit within the past 15 years. Fecal occult blood test (FOBT) of the stool. You may have this test every year starting at age 84. Flexible sigmoidoscopy or colonoscopy. You may have a sigmoidoscopy every 5 years or a colonoscopy every 10 years starting at age 13. Hepatitis C blood test. Hepatitis B blood test. Sexually transmitted disease (STD) testing. Diabetes screening. This is done by checking your blood sugar (glucose) after you have not eaten for a while (fasting). You may have this done every 1-3 years. Bone density scan. This is done to screen for osteoporosis. You may have this done starting at age 43. Mammogram. This may be done every 1-2 years. Talk to your health care provider about how often you should have regular mammograms. Talk with your health care provider about your test results, treatment options, and if necessary, the need for more tests. Vaccines  Your health care provider may recommend certain vaccines, such as: Influenza vaccine. This is recommended every year. Tetanus, diphtheria, and acellular pertussis (Tdap, Td) vaccine. You may need a Td booster every 10 years. Zoster vaccine. You may need this after age 75. Pneumococcal 13-valent conjugate (PCV13) vaccine. One dose is recommended after age 24. Pneumococcal polysaccharide (PPSV23) vaccine. One dose is recommended after age 44. Talk to your health care provider about  which screenings and vaccines you need and how  often you need them. This information is not intended to replace advice given to you by your health care provider. Make sure you discuss any questions you have with your health care provider. Document Released: 03/03/2015 Document Revised: 10/25/2015 Document Reviewed: 12/06/2014 Elsevier Interactive Patient Education  2017 [place] Prevention in the Home Falls can cause injuries. They can happen to people of all ages. There are many things you can do to make your home safe and to help prevent falls. What can I do on the outside of my home? Regularly fix the edges of walkways and driveways and fix any cracks. Remove anything that might make you trip as you walk through a door, such as a raised step or threshold. Trim any bushes or trees on the path to your home. Use bright outdoor lighting. Clear any walking paths of anything that might make someone trip, such as rocks or tools. Regularly check to see if handrails are loose or broken. Make sure that both sides of any steps have handrails. Any raised decks and porches should have guardrails on the edges. Have any leaves, snow, or ice cleared regularly. Use sand or salt on walking paths during winter. Clean up any spills in your garage right away. This includes oil or grease spills. What can I do in the bathroom? Use night lights. Install grab bars by the toilet and in the tub and shower. Do not use towel bars as grab bars. Use non-skid mats or decals in the tub or shower. If you need to sit down in the shower, use a plastic, non-slip stool. Keep the floor dry. Clean up any water that spills on the floor as soon as it happens. Remove soap buildup in the tub or shower regularly. Attach bath mats securely with double-sided non-slip rug tape. Do not have throw rugs and other things on the floor that can make you trip. What can I do in the bedroom? Use night lights. Make sure that you have a light by your bed that is easy to  reach. Do not use any sheets or blankets that are too big for your bed. They should not hang down onto the floor. Have a firm chair that has side arms. You can use this for support while you get dressed. Do not have throw rugs and other things on the floor that can make you trip. What can I do in the kitchen? Clean up any spills right away. Avoid walking on wet floors. Keep items that you use a lot in easy-to-reach places. If you need to reach something above you, use a strong step stool that has a grab bar. Keep electrical cords out of the way. Do not use floor polish or wax that makes floors slippery. If you must use wax, use non-skid floor wax. Do not have throw rugs and other things on the floor that can make you trip. What can I do with my stairs? Do not leave any items on the stairs. Make sure that there are handrails on both sides of the stairs and use them. Fix handrails that are broken or loose. Make sure that handrails are as long as the stairways. Check any carpeting to make sure that it is firmly attached to the stairs. Fix any carpet that is loose or worn. Avoid having throw rugs at the top or bottom of the stairs. If you do have throw rugs, attach them to the floor with  carpet tape. Make sure that you have a light switch at the top of the stairs and the bottom of the stairs. If you do not have them, ask someone to add them for you. What else can I do to help prevent falls? Wear shoes that: Do not have high heels. Have rubber bottoms. Are comfortable and fit you well. Are closed at the toe. Do not wear sandals. If you use a stepladder: Make sure that it is fully opened. Do not climb a closed stepladder. Make sure that both sides of the stepladder are locked into place. Ask someone to hold it for you, if possible. Clearly mark and make sure that you can see: Any grab bars or handrails. First and last steps. Where the edge of each step is. Use tools that help you move  around (mobility aids) if they are needed. These include: Canes. Walkers. Scooters. Crutches. Turn on the lights when you go into a dark area. Replace any light bulbs as soon as they burn out. Set up your furniture so you have a clear path. Avoid moving your furniture around. If any of your floors are uneven, fix them. If there are any pets around you, be aware of where they are. Review your medicines with your doctor. Some medicines can make you feel dizzy. This can increase your chance of falling. Ask your doctor what other things that you can do to help prevent falls. This information is not intended to replace advice given to you by your health care provider. Make sure you discuss any questions you have with your health care provider. Document Released: 12/01/2008 Document Revised: 07/13/2015 Document Reviewed: 03/11/2014 Elsevier Interactive Patient Education  2017 [employer].

## 2021-01-16 NOTE — Progress Notes (Signed)
Subjective:   Amy Rocha is a 65 y.o. female who presents for Medicare Annual (Subsequent) preventive examination.  I connected with  Amy Rocha on 01/16/21 by a telephone enabled telemedicine application and verified that I am speaking with the correct person using two identifiers.   I discussed the limitations of evaluation and management by telemedicine. The patient expressed understanding and agreed to proceed.  Patient location: home  Provider location: Tele-Health  not in office    Review of Systems     Cardiac Risk Factors include: advanced age (>32mn, >>61women);hypertension     Objective:    Today's Vitals   01/16/21 0906  PainSc: 6    There is no height or weight on file to calculate BMI.  Advanced Directives 01/16/2021 04/27/2020 04/19/2020 11/08/2019 10/08/2019 04/02/2018 10/14/2017  Does Patient Have a Medical Advance Directive? No No No No No No No  Does patient want to make changes to medical advance directive? - - - - - Yes (MAU/Ambulatory/Procedural Areas - Information given) -  Would patient like information on creating a medical advance directive? No - Patient declined No - Patient declined No - Patient declined - No - Patient declined - No - Patient declined    Current Medications (verified) Outpatient Encounter Medications as of 01/16/2021  Medication Sig   acetaminophen (TYLENOL) 500 MG tablet Take 1,000 mg by mouth 2 (two) times daily as needed for moderate pain or headache.    albuterol (VENTOLIN HFA) 108 (90 Base) MCG/ACT inhaler Inhale 2 puffs into the lungs every 6 (six) hours as needed for wheezing or shortness of breath.   amitriptyline (ELAVIL) 10 MG tablet Take 10 mg by mouth at bedtime.   ARIPiprazole (ABILIFY) 2 MG tablet TAKE 1 TABLET BY MOUTH AT BEDTIME.   atorvastatin (LIPITOR) 40 MG tablet TAKE 1 TABLET (40 MG TOTAL) BY MOUTH AT BEDTIME.   budesonide-formoterol (SYMBICORT) 160-4.5 MCG/ACT inhaler Inhale 2 puffs into the lungs 2  (two) times daily as needed (shortness of breath).   buPROPion (WELLBUTRIN XL) 300 MG 24 hr tablet TAKE 1 TABLET BY MOUTH IN THE MORNING.   cromolyn (OPTICROM) 4 % ophthalmic solution Place 1 drop into both eyes 4 (four) times daily. (Patient taking differently: Place 1 drop into both eyes 4 (four) times daily as needed (allergies). As needed)   fluticasone (FLONASE) 50 MCG/ACT nasal spray Place 2 sprays into both nostrils daily. (Patient taking differently: Place 2 sprays into both nostrils daily as needed for allergies.)   levocetirizine (XYZAL) 5 MG tablet Take 1 tablet (5 mg total) by mouth every evening. (Patient taking differently: Take 5 mg by mouth daily as needed for allergies.)   losartan (COZAAR) 25 MG tablet TAKE 1 TABLET (25 MG TOTAL) BY MOUTH DAILY.   magnesium hydroxide (MILK OF MAGNESIA) 400 MG/5ML suspension Take 30 mLs by mouth daily as needed for mild constipation.   montelukast (SINGULAIR) 10 MG tablet TAKE 1 TABLET (10 MG TOTAL) BY MOUTH AT BEDTIME.   NARCAN 4 MG/0.1ML LIQD nasal spray kit Place 1 spray into the nose once.   omeprazole (PRILOSEC) 20 MG capsule Take 1 capsule (20 mg total) by mouth daily.   Oxycodone HCl 20 MG TABS Take 20 mg by mouth every 4 (four) hours as needed (pain).   rOPINIRole (REQUIP) 0.5 MG tablet Take 1 tablet (0.5 mg total) by mouth at bedtime as needed. (Patient taking differently: Take 0.5 mg by mouth at bedtime as needed (RLS).)   TRINTELLIX  20 MG TABS tablet TAKE 1 TABLET BY MOUTH DAILY.   valACYclovir (VALTREX) 500 MG tablet Take 4 tablets (2,000 mg total) by mouth 2 (two) times daily as needed.   ondansetron (ZOFRAN-ODT) 4 MG disintegrating tablet Take 1 tablet (4 mg total) by mouth every 8 (eight) hours as needed for nausea or vomiting.   oxyCODONE-acetaminophen (PERCOCET) 10-325 MG tablet Take 1 tablet by mouth every 6 (six) hours as needed for pain. (Patient not taking: Reported on 09/20/2020)   No facility-administered encounter medications  on file as of 01/16/2021.    Allergies (verified) Fluticasone-salmeterol, Lactose intolerance (gi), and Tetracyclines & related   History: Past Medical History:  Diagnosis Date   Anxiety    Arthritis    Asthma    Avascular necrosis ([place])    Blood transfusion without reported diagnosis    Calculus of kidney    CHF (congestive heart failure) (HCC)    Chronic pain syndrome    Complication of anesthesia    aspirated in recovery after  hip replacement 1998   COPD (chronic obstructive pulmonary disease) (HCC)    Depression    GERD (gastroesophageal reflux disease)    Heart failure (HCC)    History of kidney stones    Hyperlipidemia    Hypertension    IBS (irritable bowel syndrome)    Joint pain    Osteoporosis    Oxygen deficiency    Peripheral vascular disease (HCC)    PONV (postoperative nausea and vomiting)    Urge incontinence    Past Surgical History:  Procedure Laterality Date   ABDOMINAL HYSTERECTOMY     avascular necrosis     CARDIAC CATHETERIZATION Left 12/01/2015   Procedure: Left Heart Cath and Coronary Angiography;  Surgeon: Teodoro Spray, MD;  Location: [facility];  Service: Cardiovascular;  Laterality: Left;   COLONOSCOPY WITH PROPOFOL N/A 10/08/2019   Procedure: COLONOSCOPY WITH PROPOFOL;  Surgeon: [name], MD;  Location: [facility] ENDOSCOPY;  Service: Gastroenterology;  Laterality: N/A;   ESOPHAGOGASTRODUODENOSCOPY (EGD) WITH PROPOFOL N/A 11/08/2019   Procedure: ESOPHAGOGASTRODUODENOSCOPY (EGD) WITH PROPOFOL;  Surgeon: [name], MD;  Location[facility] END[facility]COPY;  Service: Gastroenterology;  Laterality: N/A;   eyelid lift     HARDWARE REMOVAL Left 04/27/2020   Procedure: Left hip, deep hardware removal;  Surgeon: Hessie Knows, MD;  Location: [facility] ORS;  Service: Orthopedics;  Laterality: Left;   HERNIA REPAIR     INTRAMEDULLARY (IM) NAIL INTERTROCHANTERIC Left 10/14/2017   Procedure: REPAIR OF LEFT GREATER TROCHANTER NONUNION;  Surgeon: Hessie Knows,  MD;  Location: [facility] ORS;  Service: Orthopedics;  Laterality: Left;   JOINT REPLACEMENT     hip   NASAL SINUS SURGERY     TOTAL HIP REVISION Left 01/21/2017   Procedure: TOTAL HIP REVISION FEMORAL STEM WITH POLYETHYLENE EXCHANGE WITH CUPLINER;  Surgeon: Hessie Knows, MD;  Location: [facility] ORS;  Service: Orthopedics;  Laterality: Left;   Family History  Problem Relation Age of Onset   Dementia Mother    Cancer Father        colon   Arthritis Brother    Hypertension Daughter    Aneurysm Brother    Social History   Socioeconomic History   Marital status: Unknown    Spouse name: Not on file   Number of children: Not on file   Years of education: Not on file   Highest education level: 12th grade  Occupational History   Occupation: disabled   Tobacco Use   Smoking status: Former  Smokeless tobacco: Never  Vaping Use   Vaping Use: Some days   Substances: Flavoring  Substance and Sexual Activity   Alcohol use: No    Alcohol/week: 0.0 standard drinks   Drug use: No   Sexual activity: Yes  Other Topics Concern   Not on file  Social History Narrative   ** Merged History Encounter **       Social Determinants of Health   Financial Resource Strain: Low Risk    Difficulty of Paying Living Expenses: Not hard at all  Food Insecurity: No Food Insecurity   Worried About Charity fundraiser in the Last Year: Never true   Ran Out of Food in the Last Year: Never true  Transportation Needs: No Transportation Needs   Lack of Transportation (Medical): No   Lack of Transportation (Non-Medical): No  Physical Activity: Inactive   Days of Exercise per Week: 0 days   Minutes of Exercise per Session: 0 min  Stress: No Stress Concern Present   Feeling of Stress : Only a little  Social Connections: Moderately Isolated   Frequency of Communication with Friends and Family: More than three times a week   Frequency of Social Gatherings with Friends and Family: Twice a week   Attends Religious  Services: Never   Marine scientist or Organizations: No   Attends Music therapist: Never   Marital Status: Married    Tobacco Counseling Counseling given: Not Answered   Clinical Intake:  Pre-visit preparation completed: Yes  Pain : 0-10 Pain Score: 6  Pain Type: Chronic pain Pain Location: Hip Pain Orientation: Left, Right Pain Descriptors / Indicators: Burning, Constant, Aching Pain Onset: More than a month ago Pain Frequency: Constant Pain Relieving Factors: oxycodone 20 mg Effect of Pain on Daily Activities: yes  Pain Relieving Factors: oxycodone 20 mg  Nutritional Risks: None Diabetes: No  How often do you need to have someone help you when you read instructions, pamphlets, or other written materials from your doctor or pharmacy?: 1 - Never  Diabetic?no  Interpreter Needed?: No  Information entered by :: Leroy Kennedy LPN   Activities of Daily Living In your present state of health, do you have any difficulty performing the following activities: 01/16/2021 04/19/2020  Hearing? N -  Vision? N -  Difficulty concentrating or making decisions? N -  Walking or climbing stairs? N N  Dressing or bathing? N -  Doing errands, shopping? N N  Preparing Food and eating ? N -  Using the Toilet? N -  In the past six months, have you accidently leaked urine? N -  Do you have problems with loss of bowel control? N -  Managing your Medications? N -  Housekeeping or managing your Housekeeping? N -  Some recent data might be hidden    Patient Care Team: Jon Billings, NP as PCP - General (Nurse Practitioner)  Indicate any recent Medical Services you may have received from other than [facility] providers in the past year (date may be approximate).     Assessment:   This is a routine wellness examination for Carmine.  Hearing/Vision screen Hearing Screening - Comments:: No trouble hearing Vision Screening - Comments:: [place] Up to  date  Dietary issues and exercise activities discussed: Current Exercise Habits: The patient does not participate in regular exercise at present, Exercise limited by: None identified   Goals Addressed             This Visit's  Progress    Increase physical activity       Increase water intake   On track    Recommend drinking at least 5-6 glasses of water a day        Depression Screen PHQ 2/9 Scores 01/16/2021 08/10/2020 01/20/2020 12/21/2019 08/13/2019 06/23/2019 05/05/2019  PHQ - 2 Score '2 2 2 1 1 1 1  ' PHQ- 9 Score '4 5 4 5 5 ' - 2  Exception Documentation - - - - - - -    Fall Risk Fall Risk  01/16/2021 01/20/2020 12/21/2019 06/23/2019 05/05/2019  Falls in the past year? 0 0 0 0 0  Comment - - - - -  Number falls in past yr: 0 0 - 0 0  Injury with Fall? 0 0 - 0 0  Comment - - - - -  Risk Factor Category  - - - - -  Risk for fall due to : - - - - -  Follow up Falls prevention discussed;Falls evaluation completed - Falls evaluation completed - -    FALL RISK PREVENTION PERTAINING TO THE HOME:  Any stairs in or around the home? No  If so, are there any without handrails? No  Home free of loose throw rugs in walkways, pet beds, electrical cords, etc? Yes  Adequate lighting in your home to reduce risk of falls? Yes   ASSISTIVE DEVICES UTILIZED TO PREVENT FALLS:  Life alert? No  Use of a cane, walker or w/c? No  Grab bars in the bathroom? Yes  Shower chair or bench in shower? No  Elevated toilet seat or a handicapped toilet? Yes   TIMED UP AND GO:  Was the test performed? No .    Cognitive Function:  Normal cognitive status assessed by direct observation by this Nurse Health Advisor. No abnormalities found.       6CIT Screen 04/02/2018 09/12/2016  What Year? 0 points 0 points  What month? 0 points 0 points  What time? 0 points 0 points  Count back from 20 0 points 0 points  Months in reverse 0 points 0 points  Repeat phrase 0 points 0 points  Total Score 0 0     Immunizations Immunization History  Administered Date(s) Administered   Fluad Quad(high Dose 65+) 11/25/2018, 11/27/2019, 12/21/2020   Influenza,inj,Quad PF,6+ Mos 01/22/2017, 12/16/2017   Influenza-Unspecified 10/29/2011, 12/30/2012, 12/02/2013, 01/30/2015, 11/22/2015   PFIZER(Purple Top)SARS-COV-2 Vaccination 03/12/2019, 04/01/2019, 12/06/2019   Pneumococcal Conjugate-13 09/24/2018   Pneumococcal Polysaccharide-23 11/10/2009, 01/22/2017   Td 04/12/2008, 09/24/2018   Zoster Recombinat (Shingrix) 01/06/2018, 09/04/2018, 09/15/2018   Zoster, Live 06/28/2015    TDAP status: Up to date  Flu Vaccine status: Up to date  Pneumococcal vaccine status: Up to date  Covid-19 vaccine status: Information provided on how to obtain vaccines.   Qualifies for Shingles Vaccine? Yes   Zostavax completed No   Shingrix Completed?: Yes  Screening Tests Health Maintenance  Topic Date Due   COVID-19 Vaccine (4 - Booster for Pfizer series) 01/31/2020   MAMMOGRAM  08/19/2020   Pneumonia Vaccine 36+ Years old (3 - PPSV23 if available, else PCV20) 01/22/2022   COLONOSCOPY (Pts 45-69yr Insurance coverage will need to be confirmed)  10/07/2024   TETANUS/TDAP  09/23/2028   INFLUENZA VACCINE  Completed   DEXA SCAN  Completed   Hepatitis C Screening  Completed   Zoster Vaccines- Shingrix  Completed   HPV VACCINES  Aged Out    Health Maintenance  Health Maintenance Due  Topic Date Due  COVID-19 Vaccine (4 - Booster for Pfizer series) 01/31/2020   MAMMOGRAM  08/19/2020    Colorectal cancer screening: Type of screening: Colonoscopy. Completed 2021. Repeat every 5 years  Mammogram status: Ordered  . Pt provided with contact info and advised to call to schedule appt.   Bone Density status: Completed 2014. Results reflect: Bone density results: OSTEOPOROSIS. Repeat every 2 years.  Lung Cancer Screening: (Low Dose CT Chest recommended if Age 24-80 years, 30 pack-year currently smoking OR have  quit w/in 15years.) does not qualify.   Lung Cancer Screening Referral:   Additional Screening:  Hepatitis C Screening: does not qualify;   Vision Screening: Recommended annual ophthalmology exams for early detection of glaucoma and other disorders of the eye. Is the patient up to date with their annual eye exam?  No  Who is the provider or what is the name of the office in which the patient attends annual eye exams? [facility] If pt is not established with a provider, would they like to be referred to a provider to establish care? No .   Dental Screening: Recommended annual dental exams for proper oral hygiene  Community Resource Referral / Chronic Care Management: CRR required this visit?  No   CCM required this visit?  No      Plan:     I have personally reviewed and noted the following in the patient's chart:   Medical and social history Use of alcohol, tobacco or illicit drugs  Current medications and supplements including opioid prescriptions.  Functional ability and status Nutritional status Physical activity Advanced directives List of other physicians Hospitalizations, surgeries, and ER visits in previous 12 months Vitals Screenings to include cognitive, depression, and falls Referrals and appointments  In addition, I have reviewed and discussed with patient certain preventive protocols, quality metrics, and best practice recommendations. A written personalized care plan for preventive services as well as general preventive health recommendations were provided to patient.     Leroy Kennedy, LPN   [id number]   Nurse Notes:

## 2021-01-17 MED ORDER — ALBUTEROL SULFATE HFA 108 (90 BASE) MCG/ACT IN AERS: 2.0000 | INHALATION_SPRAY | Freq: Four times a day (QID) | RESPIRATORY_TRACT | 3 refills | Status: DC | PRN

## 2021-01-17 NOTE — Telephone Encounter (Signed)
Requested Prescriptions  Pending Prescriptions Disp Refills  . albuterol (VENTOLIN HFA) 108 (90 Base) MCG/ACT inhaler [Pharmacy Med Name: ALBUTEROL SULFATE HFA 108 108 (90 BAS Aerosol] 25.5 g 3    Sig: INHALE 2 PUFFS INTO THE LUNGS EVERY 6 HOURS AS NEEDED FOR WHEEZING OR SHORTNESS OF BREATH.     Pulmonology:  Beta Agonists Failed - 01/15/2021 12:20 PM      Failed - One inhaler should last at least one month. If the patient is requesting refills earlier, contact the patient to check for uncontrolled symptoms.      Passed - Valid encounter within last 12 months    Recent Outpatient Visits          3 months ago Chronic congestive heart failure, unspecified heart failure type [facility])   [facility] Jon Billings, NP   5 months ago Gastroesophageal reflux disease, unspecified whether esophagitis present   [facility] Jon Billings, NP   9 months ago Suspected COVID-19 virus infection   [facility], [place], DO   11 months ago Suspected COVID-19 virus infection   [facility] [place], [place], DO   12 months ago Hypertensive chronic kidney disease, unspecified CKD stage   [facility] Valerie Roys, DO      Future Appointments            In 2 months Jon Billings, NP [facility], [place]

## 2021-01-23 DIAGNOSIS — G894 Chronic pain syndrome: Secondary | ICD-10-CM | POA: Diagnosis not present

## 2021-02-21 DIAGNOSIS — G894 Chronic pain syndrome: Secondary | ICD-10-CM | POA: Diagnosis not present

## 2021-02-27 ENCOUNTER — Other Ambulatory Visit: Payer: Self-pay | Admitting: Nurse Practitioner

## 2021-02-27 NOTE — Telephone Encounter (Signed)
Requested Prescriptions  Pending Prescriptions Disp Refills   atorvastatin (LIPITOR) 40 MG tablet [Pharmacy Med Name: ATORVASTATIN 40 MG TAB 40 Tablet] 90 tablet 0    Sig: TAKE 1 TABLET (40 MG TOTAL) BY MOUTH AT BEDTIME.     Cardiovascular:  Antilipid - Statins Passed - 02/27/2021  6:25 PM      Passed - Total Cholesterol in normal range and within 360 days    Cholesterol, Total  Date Value Ref Range Status  09/20/2020 177 100 - 199 mg/dL Final         Passed - LDL in normal range and within 360 days    LDL Chol Calc (NIH)  Date Value Ref Range Status  09/20/2020 83 0 - 99 mg/dL Final         Passed - HDL in normal range and within 360 days    HDL  Date Value Ref Range Status  09/20/2020 75 >39 mg/dL Final         Passed - Triglycerides in normal range and within 360 days    Triglycerides  Date Value Ref Range Status  09/20/2020 109 0 - 149 mg/dL Final         Passed - Patient is not pregnant      Passed - Valid encounter within last 12 months    Recent Outpatient Visits          5 months ago Chronic congestive heart failure, unspecified heart failure type ([place])   [facility] Jon Billings, NP   6 months ago Gastroesophageal reflux disease, unspecified whether esophagitis present   [facility] Jon Billings, NP   10 months ago Suspected COVID-19 virus infection   [facility] [place], [place], DO   1 year ago Suspected COVID-19 virus infection   [facility] [place], [place], DO   1 year ago Hypertensive chronic kidney disease, unspecified CKD stage   [facility] Valerie Roys, DO      Future Appointments            In 3 weeks Jon Billings, NP [place]            omeprazole (PRILOSEC) 20 MG capsule [Pharmacy Med Name: OMEPRAZOLE 20 MG CAP 20 Capsule] 90 capsule 1    Sig: TAKE 1 CAPSULE BY MOUTH DAILY.     Gastroenterology: Proton Pump Inhibitors Passed -  02/27/2021  6:25 PM      Passed - Valid encounter within last 12 months    Recent Outpatient Visits          5 months ago Chronic congestive heart failure, unspecified heart failure type [facility])   [facility] Jon Billings, NP   6 months ago Gastroesophageal reflux disease, unspecified whether esophagitis present   [facility] Jon Billings, NP   10 months ago Suspected COVID-19 virus infection   [facility] [place], [place], DO   1 year ago Suspected COVID-19 virus infection   [facility] [place], [place], DO   1 year ago Hypertensive chronic kidney disease, unspecified CKD stage   [facility] Valerie Roys, DO      Future Appointments            In 3 weeks Jon Billings, NP [facility], [place]

## 2021-03-08 ENCOUNTER — Other Ambulatory Visit: Payer: Self-pay | Admitting: Orthopedic Surgery

## 2021-03-08 DIAGNOSIS — T8484XA Pain due to internal orthopedic prosthetic devices, implants and grafts, initial encounter: Secondary | ICD-10-CM | POA: Diagnosis not present

## 2021-03-08 DIAGNOSIS — M25551 Pain in right hip: Secondary | ICD-10-CM | POA: Diagnosis not present

## 2021-03-08 DIAGNOSIS — Z96641 Presence of right artificial hip joint: Secondary | ICD-10-CM | POA: Diagnosis not present

## 2021-03-08 DIAGNOSIS — I70209 Unspecified atherosclerosis of native arteries of extremities, unspecified extremity: Secondary | ICD-10-CM | POA: Diagnosis not present

## 2021-03-08 DIAGNOSIS — M8700 Idiopathic aseptic necrosis of unspecified bone: Secondary | ICD-10-CM | POA: Diagnosis not present

## 2021-03-08 DIAGNOSIS — Z96649 Presence of unspecified artificial hip joint: Secondary | ICD-10-CM | POA: Diagnosis not present

## 2021-03-13 ENCOUNTER — Ambulatory Visit
Admission: RE | Admit: 2021-03-13 | Discharge: 2021-03-13 | Disposition: A | Discharge status: Home / Self Care | Payer: Medicare Other | Source: Ambulatory Visit | Attending: Orthopedic Surgery | Admitting: Orthopedic Surgery

## 2021-03-13 ENCOUNTER — Other Ambulatory Visit: Payer: Self-pay

## 2021-03-13 DIAGNOSIS — Z96649 Presence of unspecified artificial hip joint: Secondary | ICD-10-CM | POA: Insufficient documentation

## 2021-03-13 DIAGNOSIS — M25559 Pain in unspecified hip: Secondary | ICD-10-CM | POA: Diagnosis not present

## 2021-03-13 MED ORDER — TECHNETIUM TC 99M MEDRONATE IV KIT
20.0000 | PACK | Freq: Once | INTRAVENOUS | Status: AC | PRN
  Administered 2021-03-13: 11:00:00 20.09 via INTRAVENOUS

## 2021-03-20 DIAGNOSIS — M9904 Segmental and somatic dysfunction of sacral region: Secondary | ICD-10-CM | POA: Diagnosis not present

## 2021-03-20 DIAGNOSIS — G894 Chronic pain syndrome: Secondary | ICD-10-CM | POA: Diagnosis not present

## 2021-03-20 DIAGNOSIS — Z79891 Long term (current) use of opiate analgesic: Secondary | ICD-10-CM | POA: Diagnosis not present

## 2021-03-20 DIAGNOSIS — G8929 Other chronic pain: Secondary | ICD-10-CM | POA: Diagnosis not present

## 2021-03-26 ENCOUNTER — Encounter: Payer: Medicare Other | Admitting: Nurse Practitioner

## 2021-03-29 DIAGNOSIS — T8484XA Pain due to internal orthopedic prosthetic devices, implants and grafts, initial encounter: Secondary | ICD-10-CM | POA: Diagnosis not present

## 2021-03-29 DIAGNOSIS — M25551 Pain in right hip: Secondary | ICD-10-CM | POA: Diagnosis not present

## 2021-04-03 ENCOUNTER — Other Ambulatory Visit: Payer: Self-pay

## 2021-04-03 ENCOUNTER — Encounter: Payer: Self-pay | Admitting: Nurse Practitioner

## 2021-04-03 ENCOUNTER — Ambulatory Visit (INDEPENDENT_AMBULATORY_CARE_PROVIDER_SITE_OTHER): Payer: Medicare Other | Admitting: Nurse Practitioner

## 2021-04-03 VITALS — BP 128/81 | HR 83 | Temp 98.2°F | Ht 64.5 in | Wt 156.2 lb

## 2021-04-03 DIAGNOSIS — E559 Vitamin D deficiency, unspecified: Secondary | ICD-10-CM

## 2021-04-03 DIAGNOSIS — F331 Major depressive disorder, recurrent, moderate: Secondary | ICD-10-CM

## 2021-04-03 DIAGNOSIS — I509 Heart failure, unspecified: Secondary | ICD-10-CM

## 2021-04-03 DIAGNOSIS — R8290 Unspecified abnormal findings in urine: Secondary | ICD-10-CM | POA: Diagnosis not present

## 2021-04-03 DIAGNOSIS — N184 Chronic kidney disease, stage 4 (severe): Secondary | ICD-10-CM

## 2021-04-03 DIAGNOSIS — E782 Mixed hyperlipidemia: Secondary | ICD-10-CM

## 2021-04-03 DIAGNOSIS — Z0000 Encounter for general adult medical examination without abnormal findings: Secondary | ICD-10-CM | POA: Diagnosis not present

## 2021-04-03 DIAGNOSIS — I110 Hypertensive heart disease with heart failure: Secondary | ICD-10-CM

## 2021-04-03 DIAGNOSIS — J449 Chronic obstructive pulmonary disease, unspecified: Secondary | ICD-10-CM

## 2021-04-03 DIAGNOSIS — F419 Anxiety disorder, unspecified: Secondary | ICD-10-CM | POA: Diagnosis not present

## 2021-04-03 DIAGNOSIS — I70209 Unspecified atherosclerosis of native arteries of extremities, unspecified extremity: Secondary | ICD-10-CM | POA: Diagnosis not present

## 2021-04-03 LAB — URINALYSIS, ROUTINE W REFLEX MICROSCOPIC
Bilirubin, UA: NEGATIVE
Glucose, UA: NEGATIVE
Ketones, UA: NEGATIVE
Nitrite, UA: NEGATIVE
Protein,UA: NEGATIVE
RBC, UA: NEGATIVE
Specific Gravity, UA: 1.025 (ref 1.005–1.030)
Urobilinogen, Ur: 0.2 mg/dL (ref 0.2–1.0)
pH, UA: 5.5 (ref 5.0–7.5)

## 2021-04-03 LAB — MICROSCOPIC EXAMINATION: RBC, Urine: NONE SEEN /hpf (ref 0–2)

## 2021-04-03 MED ORDER — BUPROPION HCL ER (XL) 150 MG PO TB24: 150.0000 mg | ORAL_TABLET | Freq: Every day | ORAL | 1 refills | Status: DC

## 2021-04-03 NOTE — Patient Instructions (Signed)
Please call to schedule your mammogram and/or bone density: °[facility]  °Address: [address], [place],  [address]  °Phone: [phone] ° °

## 2021-04-03 NOTE — Assessment & Plan Note (Signed)
Chronic.  Controlled.  Continue with current medication regimen.  Labs ordered today.  Return to clinic in 6 months for reevaluation.  Call sooner if concerns arise.  ? ?

## 2021-04-03 NOTE — Assessment & Plan Note (Signed)
Chronic.  Still not doing great. Will increase Wellbutrin to 450mg .  Patient did not start the Abilify. Continue with Trintellix.  Will follow up in 1 month.  Call with any concerns.

## 2021-04-03 NOTE — Assessment & Plan Note (Signed)
Chronic. On Atorvastatin. Labs ordered today. Will make recommendations based on lab results.

## 2021-04-03 NOTE — Assessment & Plan Note (Signed)
Chronic.  Controlled.  Continue with current medication regimen.  Labs ordered today. Does not see Cardiology.  Return to clinic in 6 months for reevaluation.  Call sooner if concerns arise.   

## 2021-04-03 NOTE — Assessment & Plan Note (Signed)
Chronic.  Controlled.  Continue with current medication regimen on Losartan 50mg  daily.  Labs ordered today.  Return to clinic in 6 months for reevaluation.  Call sooner if concerns arise.

## 2021-04-03 NOTE — Assessment & Plan Note (Signed)
Chronic.  Controlled.  Continue with current medication regimen of Symbicort.  Labs ordered today.  Return to clinic in 6 months for reevaluation.  Can consider spirometry in the future if symptoms worsen.  Call sooner if concerns arise.

## 2021-04-03 NOTE — Progress Notes (Signed)
BP 128/81    Pulse 83    Temp 98.2 F (36.8 C) (Oral)    Ht 5' 4.5" (1.638 m)    Wt 156 lb 3.2 oz (70.9 kg)    LMP 09/13/1975 (Approximate)    SpO2 97%    BMI 26.40 kg/m    Subjective:    Patient ID: Amy Rocha, female    DOB: [birth year], 66 y.o.   MRN: [id number]  HPI: Amy Rocha is a 66 y.o. female presenting on 04/03/2021 for comprehensive medical examination. Current medical complaints include: sore on her face.  She currently lives with: Menopausal Symptoms: no  Patient has a sore on her face that she has been picking at it and it hasn't healed.    HYPERTENSION / HYPERLIPIDEMIA Satisfied with current treatment? yes Duration of hypertension: years BP monitoring frequency: not checking BP range:  BP medication side effects: no Past BP meds: losartan (cozaar) Duration of hyperlipidemia: years Cholesterol medication side effects: no Cholesterol supplements: none Past cholesterol medications: atorvastain (lipitor) Medication compliance: excellent compliance Aspirin: no Recent stressors: no Recurrent headaches: no Visual changes: no Palpitations: no Dyspnea: no Chest pain: no Lower extremity edema: no Dizzy/lightheaded: no  CHRONIC KIDNEY DISEASE CKD status: controlled Medications renally dose: yes Previous renal evaluation: no Pneumovax:  Up to Date Influenza Vaccine:  Up to Date   DEPRESSION Patient states she feels depressed a lot of the times.  She takes Amitriptyline for sleep at bedtime.  States she sleeps okay.  She is taking the Trintellix 31m and Wellbutrin 3016mdaily.  Denies SI.  She says a lot of her depression comes from her hip pain.   Depression Screen done today and results listed below:  [facility] 04/03/2021 01/16/2021 08/10/2020 01/20/2020 12/21/2019  Decreased Interest '1 1 1 1 ' 0  Down, Depressed, Hopeless '1 1 1 1 1  ' PHQ - 2 Score '2 2 2 2 1  ' Altered sleeping 1 1 0 1 1  Tired, decreased energy '1 1 3 1 1  ' Change in  appetite 0 0 0 0 0  Feeling bad or failure about yourself  0 0 0 0 1  Trouble concentrating 0 0 0 0 1  Moving slowly or fidgety/restless 0 0 0 0 0  Suicidal thoughts 0 0 0 0 0  PHQ-9 Score '4 4 5 4 5  ' Difficult doing work/chores Somewhat difficult Not difficult at all - - Not difficult at all  Some recent data might be hidden   GAD 7 : Generalized Anxiety Score 04/03/2021 01/20/2020 12/21/2019 05/05/2019  Nervous, Anxious, on Edge '1 1 1 ' 0  Control/stop worrying 0 0 1 1  Worry too much - different things 0 '1 1 1  ' Trouble relaxing 0 1 0 0  Restless 0 0 0 0  Easily annoyed or irritable 0 - 0 0  Afraid - awful might happen 0 0 0 0  Total GAD 7 Score 1 - 3 2  Anxiety Difficulty Somewhat difficult - Not difficult at all Not difficult at all     The patient does not have a history of falls. I did complete a risk assessment for falls. A plan of care for falls was documented.   Past Medical History:  Past Medical History:  Diagnosis Date   Anxiety    Arthritis    Asthma    Avascular necrosis ([facility]   Blood transfusion without reported diagnosis    Calculus of kidney    CHF (congestive  heart failure) (HCC)    Chronic pain syndrome    Complication of anesthesia    aspirated in recovery after  hip replacement 1998   COPD (chronic obstructive pulmonary disease) (HCC)    Depression    GERD (gastroesophageal reflux disease)    Heart failure (HCC)    History of kidney stones    Hyperlipidemia    Hypertension    IBS (irritable bowel syndrome)    Joint pain    Osteoporosis    Oxygen deficiency    Peripheral vascular disease (HCC)    PONV (postoperative nausea and vomiting)    Urge incontinence     Surgical History:  Past Surgical History:  Procedure Laterality Date   avascular necrosis     CARDIAC CATHETERIZATION Left 12/01/2015   Procedure: Left Heart Cath and Coronary Angiography;  Surgeon: Teodoro Spray, MD;  Location: [facility];  Service: Cardiovascular;   Laterality: Left;   COLONOSCOPY WITH PROPOFOL N/A 10/08/2019   Procedure: COLONOSCOPY WITH PROPOFOL;  Surgeon: [name], MD;  Location: [facility];  Service: Gastroenterology;  Laterality: N/A;   ESOPHAGOGASTRODUODENOSCOPY (EGD) WITH PROPOFOL N/A 11/08/2019   Procedure: ESOPHAGOGASTRODUODENOSCOPY (EGD) WITH PROPOFOL;  Surgeon: [name], MD;  Location: [facility]COPY;  Service: Gastroenterology;  Laterality: N/A;   eyelid lift     HARDWARE REMOVAL Left 04/27/2020   Procedure: Left hip, deep hardware removal;  Surgeon: Hessie Knows, MD;  Location: [facility];  Service: Orthopedics;  Laterality: Left;   HERNIA REPAIR     INTRAMEDULLARY (IM) NAIL INTERTROCHANTERIC Left 10/14/2017   Procedure: REPAIR OF LEFT GREATER TROCHANTER NONUNION;  Surgeon: Hessie Knows, MD;  Location: [facility];  Service: Orthopedics;  Laterality: Left;   JOINT REPLACEMENT     hip   NASAL SINUS SURGERY     TOTAL ABDOMINAL HYSTERECTOMY     TOTAL HIP REVISION Left 01/21/2017   Procedure: TOTAL HIP REVISION FEMORAL STEM WITH POLYETHYLENE EXCHANGE WITH CUPLINER;  Surgeon: Hessie Knows, MD;  Location: [facility];  Service: Orthopedics;  Laterality: Left;    Medications:  Current Outpatient Medications on File Prior to Visit  Medication Sig   acetaminophen (TYLENOL) 500 MG tablet Take 1,000 mg by mouth 2 (two) times daily as needed for moderate pain or headache.    albuterol (VENTOLIN HFA) 108 (90 Base) MCG/ACT inhaler INHALE 2 PUFFS INTO THE LUNGS EVERY 6 HOURS AS NEEDED FOR WHEEZING OR SHORTNESS OF BREATH.   albuterol (VENTOLIN HFA) 108 (90 Base) MCG/ACT inhaler Inhale 2 puffs into the lungs every 6 (six) hours as needed for wheezing or shortness of breath.   amitriptyline (ELAVIL) 10 MG tablet Take 10 mg by mouth at bedtime.   atorvastatin (LIPITOR) 40 MG tablet TAKE 1 TABLET (40 MG TOTAL) BY MOUTH AT BEDTIME.   budesonide-formoterol (SYMBICORT) 160-4.5 MCG/ACT inhaler Inhale 2 puffs into the lungs 2 (two) times  daily as needed (shortness of breath).   buPROPion (WELLBUTRIN XL) 300 MG 24 hr tablet TAKE 1 TABLET BY MOUTH IN THE MORNING.   cromolyn (OPTICROM) 4 % ophthalmic solution Place 1 drop into both eyes 4 (four) times daily. (Patient taking differently: Place 1 drop into both eyes 4 (four) times daily as needed (allergies). As needed)   fluticasone (FLONASE) 50 MCG/ACT nasal spray Place 2 sprays into both nostrils daily. (Patient taking differently: Place 2 sprays into both nostrils daily as needed for allergies.)   levocetirizine (XYZAL) 5 MG tablet Take 1 tablet (5 mg total) by mouth every evening. (Patient  taking differently: Take 5 mg by mouth daily as needed for allergies.)   losartan (COZAAR) 25 MG tablet TAKE 1 TABLET (25 MG TOTAL) BY MOUTH DAILY.   montelukast (SINGULAIR) 10 MG tablet TAKE 1 TABLET (10 MG TOTAL) BY MOUTH AT BEDTIME.   NARCAN 4 MG/0.1ML LIQD nasal spray kit Place 1 spray into the nose once.   omeprazole (PRILOSEC) 20 MG capsule TAKE 1 CAPSULE BY MOUTH DAILY.   ondansetron (ZOFRAN-ODT) 4 MG disintegrating tablet Take 1 tablet (4 mg total) by mouth every 8 (eight) hours as needed for nausea or vomiting.   Oxycodone HCl 20 MG TABS Take 20 mg by mouth every 4 (four) hours as needed (pain).   rOPINIRole (REQUIP) 0.5 MG tablet Take 1 tablet (0.5 mg total) by mouth at bedtime as needed. (Patient taking differently: Take 0.5 mg by mouth at bedtime as needed (RLS).)   TRINTELLIX 20 MG TABS tablet TAKE 1 TABLET BY MOUTH DAILY.   valACYclovir (VALTREX) 500 MG tablet Take 4 tablets (2,000 mg total) by mouth 2 (two) times daily as needed.   No current facility-administered medications on file prior to visit.    Allergies:  Allergies  Allergen Reactions   Fluticasone-Salmeterol Swelling and Other (See Comments)    Tongue sores   Lactose Intolerance (Gi) Other (See Comments)    Bloating, GI upset, diarrhea   Tetracyclines & Related Other (See Comments)    Went in to the sun and broke  out    Social History:  Social History   Socioeconomic History   Marital status: Unknown    Spouse name: Not on file   Number of children: Not on file   Years of education: Not on file   Highest education level: 12th grade  Occupational History   Occupation: disabled   Tobacco Use   Smoking status: Former   Smokeless tobacco: Never  Scientific laboratory technician Use: Former   Substances: Flavoring  Substance and Sexual Activity   Alcohol use: No    Alcohol/week: 0.0 standard drinks   Drug use: No   Sexual activity: Yes  Other Topics Concern   Not on file  Social History Narrative   ** Merged History Encounter **       Social Determinants of Health   Financial Resource Strain: Low Risk    Difficulty of Paying Living Expenses: Not hard at all  Food Insecurity: No Food Insecurity   Worried About Charity fundraiser in the Last Year: Never true   Ran Out of Food in the Last Year: Never true  Transportation Needs: No Transportation Needs   Lack of Transportation (Medical): No   Lack of Transportation (Non-Medical): No  Physical Activity: Inactive   Days of Exercise per Week: 0 days   Minutes of Exercise per Session: 0 min  Stress: No Stress Concern Present   Feeling of Stress : Only a little  Social Connections: Moderately Isolated   Frequency of Communication with Friends and Family: More than three times a week   Frequency of Social Gatherings with Friends and Family: Twice a week   Attends Religious Services: Never   Marine scientist or Organizations: No   Attends Music therapist: Never   Marital Status: Married  Human resources officer Violence: Not At Risk   Fear of Current or Ex-Partner: No   Emotionally Abused: No   Physically Abused: No   Sexually Abused: No   Social History   Tobacco Use  Smoking  Status Former  Smokeless Tobacco Never   Social History   Substance and Sexual Activity  Alcohol Use No   Alcohol/week: 0.0 standard drinks     Family History:  Family History  Problem Relation Age of Onset   Dementia Mother    Cancer Father        colon   Arthritis Brother    Hypertension Daughter    Aneurysm Brother     Past medical history, surgical history, medications, allergies, family history and social history reviewed with patient today and changes made to appropriate areas of the chart.   Review of Systems  Eyes:  Negative for blurred vision and double vision.  Respiratory:  Negative for shortness of breath.   Cardiovascular:  Negative for chest pain, palpitations and leg swelling.  Musculoskeletal:        Hip pain  Neurological:  Negative for dizziness and headaches.  All other ROS negative except what is listed above and in the HPI.      Objective:    BP 128/81    Pulse 83    Temp 98.2 F (36.8 C) (Oral)    Ht 5' 4.5" (1.638 m)    Wt 156 lb 3.2 oz (70.9 kg)    LMP 09/13/1975 (Approximate)    SpO2 97%    BMI 26.40 kg/m   Wt Readings from Last 3 Encounters:  04/03/21 156 lb 3.2 oz (70.9 kg)  09/20/20 159 lb 6.4 oz (72.3 kg)  04/19/20 160 lb (72.6 kg)    Physical Exam Vitals and nursing note reviewed.  Constitutional:      General: She is awake. She is not in acute distress.    Appearance: She is well-developed. She is not ill-appearing.  HENT:     Head: Normocephalic and atraumatic.     Right Ear: Hearing, tympanic membrane, ear canal and external ear normal. No drainage.     Left Ear: Hearing, tympanic membrane, ear canal and external ear normal. No drainage.     Nose: Nose normal.     Right Sinus: No maxillary sinus tenderness or frontal sinus tenderness.     Left Sinus: No maxillary sinus tenderness or frontal sinus tenderness.     Mouth/Throat:     Mouth: Mucous membranes are moist.     Pharynx: Oropharynx is clear. Uvula midline. No pharyngeal swelling, oropharyngeal exudate or posterior oropharyngeal erythema.  Eyes:     General: Lids are normal.        Right eye: No discharge.         Left eye: No discharge.     Extraocular Movements: Extraocular movements intact.     Conjunctiva/sclera: Conjunctivae normal.     Pupils: Pupils are equal, round, and reactive to light.     Visual Fields: Right eye visual fields normal and left eye visual fields normal.  Neck:     Thyroid: No thyromegaly.     Vascular: No carotid bruit.     Trachea: Trachea normal.  Cardiovascular:     Rate and Rhythm: Normal rate and regular rhythm.     Heart sounds: Normal heart sounds. No murmur heard.   No gallop.  Pulmonary:     Effort: Pulmonary effort is normal. No accessory muscle usage or respiratory distress.     Breath sounds: Normal breath sounds.  Chest:  Breasts:    Right: Normal.     Left: Normal.  Abdominal:     General: Bowel sounds are normal.     Palpations:  Abdomen is soft. There is no hepatomegaly or splenomegaly.     Tenderness: There is no abdominal tenderness.  Musculoskeletal:        General: Normal range of motion.     Cervical back: Normal range of motion and neck supple.     Right lower leg: No edema.     Left lower leg: No edema.  Lymphadenopathy:     Head:     Right side of head: No submental, submandibular, tonsillar, preauricular or posterior auricular adenopathy.     Left side of head: No submental, submandibular, tonsillar, preauricular or posterior auricular adenopathy.     Cervical: No cervical adenopathy.     Upper Body:     Right upper body: No supraclavicular, axillary or pectoral adenopathy.     Left upper body: No supraclavicular, axillary or pectoral adenopathy.  Skin:    General: Skin is warm and dry.     Capillary Refill: Capillary refill takes less than 2 seconds.     Findings: No rash.  Neurological:     Mental Status: She is alert and oriented to person, place, and time.     Gait: Gait is intact.     Deep Tendon Reflexes: Reflexes are normal and symmetric.     Reflex Scores:      Brachioradialis reflexes are 2+ on the right side and 2+ on  the left side.      Patellar reflexes are 2+ on the right side and 2+ on the left side. Psychiatric:        Attention and Perception: Attention normal.        Mood and Affect: Mood normal.        Speech: Speech normal.        Behavior: Behavior normal. Behavior is cooperative.        Thought Content: Thought content normal.        Judgment: Judgment normal.    Results for orders placed or performed in visit on 09/20/20  Comp Met (CMET)  Result Value Ref Range   Glucose 84 65 - 99 mg/dL   BUN 8 8 - 27 mg/dL   Creatinine, Ser 1.04 (H) 0.57 - 1.00 mg/dL   eGFR 59 (L) >59 mL/min/1.73   BUN/Creatinine Ratio 8 (L) 12 - 28   Sodium 141 134 - 144 mmol/L   Potassium 4.7 3.5 - 5.2 mmol/L   Chloride 105 96 - 106 mmol/L   CO2 22 20 - 29 mmol/L   Calcium 9.2 8.7 - 10.3 mg/dL   Total Protein 6.7 6.0 - 8.5 g/dL   Albumin 4.2 3.8 - 4.8 g/dL   Globulin, Total 2.5 1.5 - 4.5 g/dL   Albumin/Globulin Ratio 1.7 1.2 - 2.2   Bilirubin Total 0.4 0.0 - 1.2 mg/dL   Alkaline Phosphatase 100 44 - 121 IU/L   AST 35 0 - 40 IU/L   ALT 25 0 - 32 IU/L  Lipid Profile  Result Value Ref Range   Cholesterol, Total 177 100 - 199 mg/dL   Triglycerides 109 0 - 149 mg/dL   HDL 75 >39 mg/dL   VLDL Cholesterol Cal 19 5 - 40 mg/dL   LDL Chol Calc (NIH) 83 0 - 99 mg/dL   Chol/HDL Ratio 2.4 0.0 - 4.4 ratio  Vitamin D (25 hydroxy)  Result Value Ref Range   Vit D, 25-Hydroxy 43.6 30.0 - 100.0 ng/mL      Assessment & Plan:   Problem List Items Addressed This Visit  Cardiovascular and Mediastinum   Hypertensive heart failure (HCC)    Chronic.  Controlled.  Continue with current medication regimen on Losartan 5m daily.  Labs ordered today.  Return to clinic in 6 months for reevaluation.  Call sooner if concerns arise.        CHF (congestive heart failure) (HCC)    Chronic.  Controlled.  Continue with current medication regimen.  Labs ordered today. Does not see Cardiology.  Return to clinic in 6 months  for reevaluation.  Call sooner if concerns arise.        Atherosclerotic peripheral vascular disease (HCC)    Chronic. On Atorvastatin. Labs ordered today. Will make recommendations based on lab results.        Respiratory   COPD (chronic obstructive pulmonary disease) (HCC)    Chronic.  Controlled.  Continue with current medication regimen of Symbicort.  Labs ordered today.  Return to clinic in 6 months for reevaluation.  Can consider spirometry in the future if symptoms worsen.  Call sooner if concerns arise.          Genitourinary   CKD (chronic kidney disease), stage IV (HCC)    Chronic.  Controlled.  Continue with current medication regimen.  Labs ordered today.  Return to clinic in 6 months for reevaluation.  Call sooner if concerns arise.          Other   Acute anxiety    Chronic.  Still not doing great. Will increase Wellbutrin to 4561m  Patient did not start the Abilify. Continue with Trintellix.  Will follow up in 1 month.  Call with any concerns.       Relevant Medications   buPROPion (WELLBUTRIN XL) 150 MG 24 hr tablet   Hyperlipidemia    Labs ordered today.  Continue with Atorvastatin 4024maily.  Will make recommendations based on lab results. Continue with Atorvastatin 92m78mily.      Relevant Orders   Lipid panel   Depression    Chronic.  Still not doing great. Will increase Wellbutrin to 450mg107matient did not start the Abilify. Continue with Trintellix.  Will follow up in 1 month.  Call with any concerns.       Relevant Medications   buPROPion (WELLBUTRIN XL) 150 MG 24 hr tablet   Vitamin D deficiency    Labs ordered today. Will make recommendations based on lab results.       Relevant Orders   Vitamin D (25 hydroxy)   Other Visit Diagnoses     Annual physical exam    -  Primary   Health maintenance reviewed during visit. Labs ordered today. Mammogram ordered. Up to date on vaccines.   Relevant Orders   CBC with Differential/Platelet    Comprehensive metabolic panel   Lipid panel   TSH   Urinalysis, Routine w reflex microscopic   Abnormal urinalysis       Relevant Orders   Urine Culture        Follow up plan: Return in about 6 months (around 10/01/2021) for HTN, HLD, DM2 FU, Depression/Anxiety FU.   LABORATORY TESTING:  - Pap smear: not applicable  IMMUNIZATIONS:   - Tdap: Tetanus vaccination status reviewed: last tetanus booster within 10 years. - Influenza: Up to date - Pneumovax: Up to date - Prevnar: Up to date - COVID: Up to date - HPV: Not applicable - Shingrix vaccine: Up to date  SCREENING: -Mammogram: Ordered today  - Colonoscopy: Up to date  - Bone Density:  Up to date  -Hearing Test: Not applicable  -Spirometry: Not applicable   PATIENT COUNSELING:   Advised to take 1 mg of folate supplement per day if capable of pregnancy.   Sexuality: Discussed sexually transmitted diseases, partner selection, use of condoms, avoidance of unintended pregnancy  and contraceptive alternatives.   Advised to avoid cigarette smoking.  I discussed with the patient that most people either abstain from alcohol or drink within safe limits (<=14/week and <=4 drinks/occasion for males, <=7/weeks and <= 3 drinks/occasion for females) and that the risk for alcohol disorders and other health effects rises proportionally with the number of drinks per week and how often a drinker exceeds daily limits.  Discussed cessation/primary prevention of drug use and availability of treatment for abuse.   Diet: Encouraged to adjust caloric intake to maintain  or achieve ideal body weight, to reduce intake of dietary saturated fat and total fat, to limit sodium intake by avoiding high sodium foods and not adding table salt, and to maintain adequate dietary potassium and calcium preferably from fresh fruits, vegetables, and low-fat dairy products.    stressed the importance of regular exercise  Injury prevention: Discussed safety  belts, safety helmets, smoke detector, smoking near bedding or upholstery.   Dental health: Discussed importance of regular tooth brushing, flossing, and dental visits.    NEXT PREVENTATIVE PHYSICAL DUE IN 1 YEAR. Return in about 6 months (around 10/01/2021) for HTN, HLD, DM2 FU, Depression/Anxiety FU.

## 2021-04-03 NOTE — Assessment & Plan Note (Signed)
Labs ordered today.  Continue with Atorvastatin 40mg  daily.  Will make recommendations based on lab results. Continue with Atorvastatin 40mg  daily.

## 2021-04-03 NOTE — Assessment & Plan Note (Signed)
Labs ordered today.  Will make recommendations based on lab results. ?

## 2021-04-04 LAB — CBC WITH DIFFERENTIAL/PLATELET
Basophils Absolute: 0.1 10*3/uL (ref 0.0–0.2)
Basos: 2 %
EOS (ABSOLUTE): 0.3 10*3/uL (ref 0.0–0.4)
Eos: 4 %
Hematocrit: 43.8 % (ref 34.0–46.6)
Hemoglobin: 14.6 g/dL (ref 11.1–15.9)
Immature Grans (Abs): 0 10*3/uL (ref 0.0–0.1)
Immature Granulocytes: 0 %
Lymphocytes Absolute: 1.8 10*3/uL (ref 0.7–3.1)
Lymphs: 25 %
MCH: 30.4 pg (ref 26.6–33.0)
MCHC: 33.3 g/dL (ref 31.5–35.7)
MCV: 91 fL (ref 79–97)
Monocytes Absolute: 0.9 10*3/uL (ref 0.1–0.9)
Monocytes: 13 %
Neutrophils Absolute: 4.1 10*3/uL (ref 1.4–7.0)
Neutrophils: 56 %
Platelets: 327 10*3/uL (ref 150–450)
RBC: 4.8 x10E6/uL (ref 3.77–5.28)
RDW: 13.3 % (ref 11.7–15.4)
WBC: 7.2 10*3/uL (ref 3.4–10.8)

## 2021-04-04 LAB — COMPREHENSIVE METABOLIC PANEL
ALT: 50 IU/L — ABNORMAL HIGH (ref 0–32)
AST: 48 IU/L — ABNORMAL HIGH (ref 0–40)
Albumin/Globulin Ratio: 1.6 (ref 1.2–2.2)
Albumin: 4 g/dL (ref 3.8–4.8)
Alkaline Phosphatase: 115 IU/L (ref 44–121)
BUN/Creatinine Ratio: 11 — ABNORMAL LOW (ref 12–28)
BUN: 12 mg/dL (ref 8–27)
Bilirubin Total: 0.3 mg/dL (ref 0.0–1.2)
CO2: 22 mmol/L (ref 20–29)
Calcium: 9.4 mg/dL (ref 8.7–10.3)
Chloride: 105 mmol/L (ref 96–106)
Creatinine, Ser: 1.05 mg/dL — ABNORMAL HIGH (ref 0.57–1.00)
Globulin, Total: 2.5 g/dL (ref 1.5–4.5)
Glucose: 87 mg/dL (ref 70–99)
Potassium: 4.5 mmol/L (ref 3.5–5.2)
Sodium: 142 mmol/L (ref 134–144)
Total Protein: 6.5 g/dL (ref 6.0–8.5)
eGFR: 58 mL/min/{1.73_m2} — ABNORMAL LOW (ref >59)

## 2021-04-04 LAB — LIPID PANEL
Chol/HDL Ratio: 2.4 ratio (ref 0.0–4.4)
Cholesterol, Total: 176 mg/dL (ref 100–199)
HDL: 74 mg/dL (ref >39)
LDL Chol Calc (NIH): 85 mg/dL (ref 0–99)
Triglycerides: 93 mg/dL (ref 0–149)
VLDL Cholesterol Cal: 17 mg/dL (ref 5–40)

## 2021-04-04 LAB — TSH: TSH: 4.41 u[IU]/mL (ref 0.450–4.500)

## 2021-04-04 LAB — VITAMIN D 25 HYDROXY (VIT D DEFICIENCY, FRACTURES): Vit D, 25-Hydroxy: 32.8 ng/mL (ref 30.0–100.0)

## 2021-04-04 NOTE — Progress Notes (Signed)
Please let patient know that overall her lab work looks good.  Kidney function remains stable.  Liver enzymes are elevated but similar to prior. Her urine was slightly abnormal so I have sent it for culture to make sure no antibiotics are needed.  No other concerns at this time.  I will see her at our next visit.

## 2021-04-06 ENCOUNTER — Telehealth: Payer: Self-pay | Admitting: Nurse Practitioner

## 2021-04-06 LAB — URINE CULTURE

## 2021-04-06 NOTE — Telephone Encounter (Signed)
Called pt and let her know.

## 2021-04-06 NOTE — Progress Notes (Signed)
Hi [name].  Your urine grew group beta hemolytic strep.  You do not need treatment at this time.  This is a normal bacteria.  Please let me know if you have any questions.

## 2021-04-17 DIAGNOSIS — M9951 Intervertebral disc stenosis of neural canal of cervical region: Secondary | ICD-10-CM | POA: Diagnosis not present

## 2021-04-17 DIAGNOSIS — M9904 Segmental and somatic dysfunction of sacral region: Secondary | ICD-10-CM | POA: Diagnosis not present

## 2021-04-17 DIAGNOSIS — M792 Neuralgia and neuritis, unspecified: Secondary | ICD-10-CM | POA: Diagnosis not present

## 2021-04-17 DIAGNOSIS — R519 Headache, unspecified: Secondary | ICD-10-CM | POA: Diagnosis not present

## 2021-04-17 DIAGNOSIS — M48062 Spinal stenosis, lumbar region with neurogenic claudication: Secondary | ICD-10-CM | POA: Diagnosis not present

## 2021-04-17 DIAGNOSIS — M79669 Pain in unspecified lower leg: Secondary | ICD-10-CM | POA: Diagnosis not present

## 2021-04-17 DIAGNOSIS — M5136 Other intervertebral disc degeneration, lumbar region: Secondary | ICD-10-CM | POA: Diagnosis not present

## 2021-04-17 DIAGNOSIS — G894 Chronic pain syndrome: Secondary | ICD-10-CM | POA: Diagnosis not present

## 2021-04-17 DIAGNOSIS — M79609 Pain in unspecified limb: Secondary | ICD-10-CM | POA: Diagnosis not present

## 2021-04-17 DIAGNOSIS — M47897 Other spondylosis, lumbosacral region: Secondary | ICD-10-CM | POA: Diagnosis not present

## 2021-04-17 DIAGNOSIS — G8929 Other chronic pain: Secondary | ICD-10-CM | POA: Diagnosis not present

## 2021-04-17 DIAGNOSIS — M545 Low back pain, unspecified: Secondary | ICD-10-CM | POA: Diagnosis not present

## 2021-05-02 ENCOUNTER — Other Ambulatory Visit: Payer: Self-pay | Admitting: Nurse Practitioner

## 2021-05-03 ENCOUNTER — Ambulatory Visit (INDEPENDENT_AMBULATORY_CARE_PROVIDER_SITE_OTHER): Payer: Medicare Other | Admitting: Nurse Practitioner

## 2021-05-03 ENCOUNTER — Other Ambulatory Visit: Payer: Self-pay

## 2021-05-03 ENCOUNTER — Encounter: Payer: Self-pay | Admitting: Nurse Practitioner

## 2021-05-03 VITALS — BP 120/79 | HR 88 | Temp 98.7°F | Wt 152.4 lb

## 2021-05-03 DIAGNOSIS — F331 Major depressive disorder, recurrent, moderate: Secondary | ICD-10-CM | POA: Diagnosis not present

## 2021-05-03 MED ORDER — BUPROPION HCL ER (XL) 300 MG PO TB24: 300.0000 mg | ORAL_TABLET | Freq: Every morning | ORAL | 1 refills | Status: DC

## 2021-05-03 NOTE — Progress Notes (Signed)
? ?BP 120/79   Pulse 88   Temp 98.7 ?F (37.1 ?C) (Oral)   Wt 152 lb 6.4 oz (69.1 kg)   LMP 09/13/1975 (Approximate)   SpO2 97%   BMI 25.76 kg/m?   ? ?Subjective:  ? ? Patient ID: Amy Rocha, female    DOB: [birth year], 66 y.o.   MRN: [id number] ? ?HPI: ?HARLY Rocha is a 66 y.o. female ? ?Chief Complaint  ?Patient presents with  ? Depression  ? Anxiety  ? ?DEPRESSION/ANXIETY ?Patient states she feels like her depression has improved from prior. She still doesn't sleep well at night but feels like that is more related to her chronic pain she experiences.  She feels like increasing the Wellbutrin has really helped her.  Denies concerns at visit today.  ? ? ? ?[place] Office Visit from 05/03/2021 in [place]  ?PHQ-9 Total Score 3  ? ?  ? ?GAD 7 : Generalized Anxiety Score 05/03/2021 04/03/2021 01/20/2020 12/21/2019  ?Nervous, Anxious, on Edge 0 '1 1 1  ' ?Control/stop worrying 0 0 0 1  ?Worry too much - different things 0 0 1 1  ?Trouble relaxing 0 0 1 0  ?Restless 0 0 0 0  ?Easily annoyed or irritable 0 0 - 0  ?Afraid - awful might happen 0 0 0 0  ?Total GAD 7 Score 0 1 - 3  ?Anxiety Difficulty Not difficult at all Somewhat difficult - Not difficult at all  ? ? ? ?Relevant past medical, surgical, family and social history reviewed and updated as indicated. Interim medical history since our last visit reviewed. ?Allergies and medications reviewed and updated. ? ?Review of Systems  ?Psychiatric/Behavioral:  Positive for dysphoric mood and sleep disturbance. Negative for suicidal ideas. The patient is nervous/anxious.   ? ?Per HPI unless specifically indicated above ? ?   ?Objective:  ?  ?BP 120/79   Pulse 88   Temp 98.7 ?F (37.1 ?C) (Oral)   Wt 152 lb 6.4 oz (69.1 kg)   LMP 09/13/1975 (Approximate)   SpO2 97%   BMI 25.76 kg/m?   ?Wt Readings from Last 3 Encounters:  ?05/03/21 152 lb 6.4 oz (69.1 kg)  ?04/03/21 156 lb 3.2 oz (70.9 kg)  ?09/20/20 159 lb 6.4 oz (72.3 kg)  ?  ?Physical  Exam ?Vitals and nursing note reviewed.  ?Constitutional:   ?   General: She is not in acute distress. ?   Appearance: Normal appearance. She is normal weight. She is not ill-appearing, toxic-appearing or diaphoretic.  ?HENT:  ?   Head: Normocephalic.  ?   Right Ear: External ear normal.  ?   Left Ear: External ear normal.  ?   Nose: Nose normal.  ?   Mouth/Throat:  ?   Mouth: Mucous membranes are moist.  ?   Pharynx: Oropharynx is clear.  ?Eyes:  ?   General:     ?   Right eye: No discharge.     ?   Left eye: No discharge.  ?   Extraocular Movements: Extraocular movements intact.  ?   Conjunctiva/sclera: Conjunctivae normal.  ?   Pupils: Pupils are equal, round, and reactive to light.  ?Cardiovascular:  ?   Rate and Rhythm: Normal rate and regular rhythm.  ?   Heart sounds: No murmur heard. ?Pulmonary:  ?   Effort: Pulmonary effort is normal. No respiratory distress.  ?   Breath sounds: Normal breath sounds. No wheezing or rales.  ?Musculoskeletal:  ?  Cervical back: Normal range of motion and neck supple.  ?Skin: ?   General: Skin is warm and dry.  ?   Capillary Refill: Capillary refill takes less than 2 seconds.  ?Neurological:  ?   General: No focal deficit present.  ?   Mental Status: She is alert and oriented to person, place, and time. Mental status is at baseline.  ?Psychiatric:     ?   Mood and Affect: Mood normal.     ?   Behavior: Behavior normal.     ?   Thought Content: Thought content normal.     ?   Judgment: Judgment normal.  ? ? ?Results for orders placed or performed in visit on 04/03/21  ?Urine Culture  ? Specimen: Urine  ? UR  ?Result Value Ref Range  ? Urine Culture, Routine Final report (A)   ? Organism ID, Bacteria Comment (A)   ?Microscopic Examination  ? Urine  ?Result Value Ref Range  ? WBC, UA 6-10 (A) 0 - 5 /hpf  ? RBC None seen 0 - 2 /hpf  ? Epithelial Cells (non renal) 0-10 0 - 10 /hpf  ? Bacteria, UA Few (A) None seen/Few  ?Vitamin D (25 hydroxy)  ?Result Value Ref Range  ? Vit D,  25-Hydroxy 32.8 30.0 - 100.0 ng/mL  ?CBC with Differential/Platelet  ?Result Value Ref Range  ? WBC 7.2 3.4 - 10.8 x10E3/uL  ? RBC 4.80 3.77 - 5.28 x10E6/uL  ? Hemoglobin 14.6 11.1 - 15.9 g/dL  ? Hematocrit 43.8 34.0 - 46.6 %  ? MCV 91 79 - 97 fL  ? MCH 30.4 26.6 - 33.0 pg  ? MCHC 33.3 31.5 - 35.7 g/dL  ? RDW 13.3 11.7 - 15.4 %  ? Platelets 327 150 - 450 x10E3/uL  ? Neutrophils 56 Not Estab. %  ? Lymphs 25 Not Estab. %  ? Monocytes 13 Not Estab. %  ? Eos 4 Not Estab. %  ? Basos 2 Not Estab. %  ? Neutrophils Absolute 4.1 1.4 - 7.0 x10E3/uL  ? Lymphocytes Absolute 1.8 0.7 - 3.1 x10E3/uL  ? Monocytes Absolute 0.9 0.1 - 0.9 x10E3/uL  ? EOS (ABSOLUTE) 0.3 0.0 - 0.4 x10E3/uL  ? Basophils Absolute 0.1 0.0 - 0.2 x10E3/uL  ? Immature Granulocytes 0 Not Estab. %  ? Immature Grans (Abs) 0.0 0.0 - 0.1 x10E3/uL  ?Comprehensive metabolic panel  ?Result Value Ref Range  ? Glucose 87 70 - 99 mg/dL  ? BUN 12 8 - 27 mg/dL  ? Creatinine, Ser 1.05 (H) 0.57 - 1.00 mg/dL  ? eGFR 58 (L) >59 mL/min/1.73  ? BUN/Creatinine Ratio 11 (L) 12 - 28  ? Sodium 142 134 - 144 mmol/L  ? Potassium 4.5 3.5 - 5.2 mmol/L  ? Chloride 105 96 - 106 mmol/L  ? CO2 22 20 - 29 mmol/L  ? Calcium 9.4 8.7 - 10.3 mg/dL  ? Total Protein 6.5 6.0 - 8.5 g/dL  ? Albumin 4.0 3.8 - 4.8 g/dL  ? Globulin, Total 2.5 1.5 - 4.5 g/dL  ? Albumin/Globulin Ratio 1.6 1.2 - 2.2  ? Bilirubin Total 0.3 0.0 - 1.2 mg/dL  ? Alkaline Phosphatase 115 44 - 121 IU/L  ? AST 48 (H) 0 - 40 IU/L  ? ALT 50 (H) 0 - 32 IU/L  ?Lipid panel  ?Result Value Ref Range  ? Cholesterol, Total 176 100 - 199 mg/dL  ? Triglycerides 93 0 - 149 mg/dL  ? HDL 74 >39 mg/dL  ? VLDL  Cholesterol Cal 17 5 - 40 mg/dL  ? LDL Chol Calc (NIH) 85 0 - 99 mg/dL  ? Chol/HDL Ratio 2.4 0.0 - 4.4 ratio  ?TSH  ?Result Value Ref Range  ? TSH 4.410 0.450 - 4.500 uIU/mL  ?Urinalysis, Routine w reflex microscopic  ?Result Value Ref Range  ? Specific Gravity, UA 1.025 1.005 - 1.030  ? pH, UA 5.5 5.0 - 7.5  ? Color, UA Yellow Yellow  ?  Appearance Ur Clear Clear  ? Leukocytes,UA 1+ (A) Negative  ? Protein,UA Negative Negative/Trace  ? Glucose, UA Negative Negative  ? Ketones, UA Negative Negative  ? RBC, UA Negative Negative  ? Bilirubin, UA Negative Negative  ? Urobilinogen, Ur 0.2 0.2 - 1.0 mg/dL  ? Nitrite, UA Negative Negative  ? Microscopic Examination See below:   ? ?   ?Assessment & Plan:  ? ?Problem List Items Addressed This Visit   ? ?  ? Other  ? Depression - Primary  ?  Chronic.  Controlled.  Continue with current medication regimen on Wellbutrin 427m daily and Trintellix 149mdaily. Refill sent today.  Return to clinic in 3 months for reevaluation.  Call sooner if concerns arise.  ? ?  ?  ? Relevant Medications  ? buPROPion (WELLBUTRIN XL) 300 MG 24 hr tablet  ?  ? ?Follow up plan: ?Return in about 3 months (around 08/03/2021) for Depression/Anxiety FU. ? ? ? ? ? ?

## 2021-05-03 NOTE — Telephone Encounter (Signed)
Notes to clinic:  has a one month follow up this afternoon, please assess. ? ? ?  ? ?Requested Prescriptions  ?Pending Prescriptions Disp Refills  ? TRINTELLIX 20 MG TABS tablet [Pharmacy Med Name: TRINTELLIX 20 MG TABLET 20 Tablet] 90 tablet 1  ?  Sig: TAKE 1 TABLET BY MOUTH DAILY.  ?  ? Psychiatry: Antidepressants - Serotonin Modulator Passed - 05/02/2021  6:09 PM  ?  ?  Passed - Completed PHQ-2 or PHQ-9 in the last 360 days  ?  ?  Passed - Valid encounter within last 6 months  ?  Recent Outpatient Visits   ? ?      ? 1 month ago Annual physical exam  ? [place], NP  ? 7 months ago Chronic congestive heart failure, unspecified heart failure type ([place])  ? [place], NP  ? 8 months ago Gastroesophageal reflux disease, unspecified whether esophagitis present  ? [place], NP  ? 1 year ago Suspected COVID-19 virus infection  ? [place], Megan P, DO  ? 1 year ago Suspected COVID-19 virus infection  ? [place], [place] P, DO  ? ?  ?  ?Future Appointments   ? ?        ? Today Jon Billings, NP [facility], [place]  ? In 5 months Jon Billings, NP [facility], [facility]  ? ?  ? ?  ?  ?  ? ? ?

## 2021-05-03 NOTE — Assessment & Plan Note (Addendum)
Chronic.  Controlled.  Continue with current medication regimen on Wellbutrin '450mg'$  daily and Trintellix '10mg'$  daily. Refill sent today.  Return to clinic in 3 months for reevaluation.  Call sooner if concerns arise.  ? ?

## 2021-05-16 DIAGNOSIS — M792 Neuralgia and neuritis, unspecified: Secondary | ICD-10-CM | POA: Diagnosis not present

## 2021-05-16 DIAGNOSIS — M5136 Other intervertebral disc degeneration, lumbar region: Secondary | ICD-10-CM | POA: Diagnosis not present

## 2021-05-16 DIAGNOSIS — G894 Chronic pain syndrome: Secondary | ICD-10-CM | POA: Diagnosis not present

## 2021-06-06 DIAGNOSIS — G8929 Other chronic pain: Secondary | ICD-10-CM | POA: Diagnosis not present

## 2021-06-06 DIAGNOSIS — G894 Chronic pain syndrome: Secondary | ICD-10-CM | POA: Diagnosis not present

## 2021-06-06 DIAGNOSIS — M5136 Other intervertebral disc degeneration, lumbar region: Secondary | ICD-10-CM | POA: Diagnosis not present

## 2021-06-06 DIAGNOSIS — M545 Low back pain, unspecified: Secondary | ICD-10-CM | POA: Diagnosis not present

## 2021-06-07 ENCOUNTER — Other Ambulatory Visit: Payer: Self-pay | Admitting: Nurse Practitioner

## 2021-06-08 NOTE — Telephone Encounter (Signed)
Can you please verify whether patient is taking '25mg'$  or '50mg'$  of losartan? ?

## 2021-06-08 NOTE — Telephone Encounter (Signed)
Patient was called and stated that she is taking '25mg'$  Losartan ?

## 2021-06-08 NOTE — Telephone Encounter (Signed)
Requested medications are due for refill today.  yes ? ?Requested medications are on the active medications list.  yes ? ?Last refill. 11/29/2020 #90 1 refill ? ?Future visit scheduled.   yes ? ?Notes to clinic.  Per note from OV of 04/03/2021 pt is taking 50 mg daily. Per Rx request pt is taking 25. Please review. ? ? ? ?Requested Prescriptions  ?Pending Prescriptions Disp Refills  ? losartan (COZAAR) 25 MG tablet [Pharmacy Med Name: LOSARTAN POTASSIUM 25 MG TA 25 Tablet] 90 tablet 1  ?  Sig: TAKE 1 TABLET (25 MG TOTAL) BY MOUTH DAILY.  ?  ? Cardiovascular:  Angiotensin Receptor Blockers Failed - 06/07/2021 12:03 PM  ?  ?  Failed - Cr in normal range and within 180 days  ?  Creatinine  ?Date Value Ref Range Status  ?09/14/2013 0.88 0.60 - 1.30 mg/dL Final  ? ?Creatinine, Ser  ?Date Value Ref Range Status  ?04/03/2021 1.05 (H) 0.57 - 1.00 mg/dL Final  ?  ?  ?  ?  Passed - K in normal range and within 180 days  ?  Potassium  ?Date Value Ref Range Status  ?04/03/2021 4.5 3.5 - 5.2 mmol/L Final  ?09/14/2013 3.7 3.5 - 5.1 mmol/L Final  ?  ?  ?  ?  Passed - Patient is not pregnant  ?  ?  Passed - Last BP in normal range  ?  BP Readings from Last 1 Encounters:  ?05/03/21 120/79  ?  ?  ?  ?  Passed - Valid encounter within last 6 months  ?  Recent Outpatient Visits   ? ?      ? 1 month ago Moderate episode of recurrent major depressive disorder ([place])  ? [place], NP  ? 2 months ago Annual physical exam  ? [place], NP  ? 8 months ago Chronic congestive heart failure, unspecified heart failure type ([place])  ? [place], NP  ? 10 months ago Gastroesophageal reflux disease, unspecified whether esophagitis present  ? [place], NP  ? 1 year ago Suspected COVID-19 virus infection  ? [place], [place] P, DO  ? ?  ?  ?Future Appointments   ? ?        ? In 1 month Jon Billings, NP [facility], [facility]  ? In 3 months Jon Billings, NP [facility], [facility]  ? ?  ? ? ?  ?  ?  ?  ?

## 2021-07-09 ENCOUNTER — Other Ambulatory Visit: Payer: Self-pay | Admitting: Nurse Practitioner

## 2021-07-10 DIAGNOSIS — M5136 Other intervertebral disc degeneration, lumbar region: Secondary | ICD-10-CM | POA: Diagnosis not present

## 2021-07-10 DIAGNOSIS — G894 Chronic pain syndrome: Secondary | ICD-10-CM | POA: Diagnosis not present

## 2021-07-10 DIAGNOSIS — M545 Low back pain, unspecified: Secondary | ICD-10-CM | POA: Diagnosis not present

## 2021-07-10 NOTE — Telephone Encounter (Signed)
Requested Prescriptions  Pending Prescriptions Disp Refills  . montelukast (SINGULAIR) 10 MG tablet [Pharmacy Med Name: MONTELUKAST SOD 10 MG TAB 10 Tablet] 90 tablet 2    Sig: TAKE 1 TABLET (10 MG TOTAL) BY MOUTH AT BEDTIME.     Pulmonology:  Leukotriene Inhibitors Passed - 07/09/2021 12:06 PM      Passed - Valid encounter within last 12 months    Recent Outpatient Visits          2 months ago Moderate episode of recurrent major depressive disorder ([place])   [place], Karen, NP   3 months ago Annual physical exam   [facility] Jon Billings, NP   9 months ago Chronic congestive heart failure, unspecified heart failure type [facility])   [facility] Jon Billings, NP   11 months ago Gastroesophageal reflux disease, unspecified whether esophagitis present   [facility] Jon Billings, NP   1 year ago Suspected COVID-19 virus infection   [facility] [place], Barb [place], DO      Future Appointments            In 3 weeks Jon Billings, NP [facility], [place]   In 2 months Jon Billings, NP [facility], [place]

## 2021-07-28 ENCOUNTER — Other Ambulatory Visit: Payer: Self-pay | Admitting: Family Medicine

## 2021-07-30 NOTE — Telephone Encounter (Signed)
last RF 05/03/21 #90 1 RF too soon Requested Prescriptions  Refused Prescriptions Disp Refills  . buPROPion (WELLBUTRIN XL) 300 MG 24 hr tablet [Pharmacy Med Name: BUPROPION HCL XL 300 MG TAB 300 Tablet] 90 tablet 1    Sig: TAKE 1 TABLET BY MOUTH IN THE MORNING.     Psychiatry: Antidepressants - bupropion Failed - 07/28/2021  1:19 PM      Failed - Cr in normal range and within 360 days    Creatinine  Date Value Ref Range Status  09/14/2013 0.88 0.60 - 1.30 mg/dL Final   Creatinine, Ser  Date Value Ref Range Status  04/03/2021 1.05 (H) 0.57 - 1.00 mg/dL Final         Failed - AST in normal range and within 360 days    AST  Date Value Ref Range Status  04/03/2021 48 (H) 0 - 40 IU/L Final   SGOT(AST)  Date Value Ref Range Status  09/14/2013 36 15 - 37 Unit/L Final         Failed - ALT in normal range and within 360 days    ALT  Date Value Ref Range Status  04/03/2021 50 (H) 0 - 32 IU/L Final   SGPT (ALT)  Date Value Ref Range Status  09/14/2013 45 U/L Final    Comment:    14-63 NOTE: New Reference Range 09/07/13          Passed - Completed PHQ-2 or PHQ-9 in the last 360 days      Passed - Last BP in normal range    BP Readings from Last 1 Encounters:  05/03/21 120/79         Passed - Valid encounter within last 6 months    Recent Outpatient Visits          2 months ago Moderate episode of recurrent major depressive disorder ([place])   [place], Karen, NP   3 months ago Annual physical exam   [facility] Jon Billings, NP   10 months ago Chronic congestive heart failure, unspecified heart failure type [facility])   [facility] Jon Billings, NP   11 months ago Gastroesophageal reflux disease, unspecified whether esophagitis present   [facility] Jon Billings, NP   1 year ago Suspected COVID-19 virus infection   [facility], [place], DO      Future Appointments             In 4 days Jon Billings, NP [facility], [place]   In 2 months Jon Billings, NP [facility], [place]

## 2021-08-03 ENCOUNTER — Ambulatory Visit: Payer: Medicare Other | Admitting: Nurse Practitioner

## 2021-08-07 DIAGNOSIS — R519 Headache, unspecified: Secondary | ICD-10-CM | POA: Diagnosis not present

## 2021-08-07 DIAGNOSIS — M48062 Spinal stenosis, lumbar region with neurogenic claudication: Secondary | ICD-10-CM | POA: Diagnosis not present

## 2021-08-07 DIAGNOSIS — M9951 Intervertebral disc stenosis of neural canal of cervical region: Secondary | ICD-10-CM | POA: Diagnosis not present

## 2021-08-07 DIAGNOSIS — M47897 Other spondylosis, lumbosacral region: Secondary | ICD-10-CM | POA: Diagnosis not present

## 2021-08-07 DIAGNOSIS — G8929 Other chronic pain: Secondary | ICD-10-CM | POA: Diagnosis not present

## 2021-08-07 DIAGNOSIS — G894 Chronic pain syndrome: Secondary | ICD-10-CM | POA: Diagnosis not present

## 2021-08-07 DIAGNOSIS — M259 Joint disorder, unspecified: Secondary | ICD-10-CM | POA: Diagnosis not present

## 2021-08-07 DIAGNOSIS — M792 Neuralgia and neuritis, unspecified: Secondary | ICD-10-CM | POA: Diagnosis not present

## 2021-08-07 DIAGNOSIS — M79609 Pain in unspecified limb: Secondary | ICD-10-CM | POA: Diagnosis not present

## 2021-08-07 DIAGNOSIS — M25559 Pain in unspecified hip: Secondary | ICD-10-CM | POA: Diagnosis not present

## 2021-08-07 DIAGNOSIS — M5136 Other intervertebral disc degeneration, lumbar region: Secondary | ICD-10-CM | POA: Diagnosis not present

## 2021-09-04 DIAGNOSIS — M5136 Other intervertebral disc degeneration, lumbar region: Secondary | ICD-10-CM | POA: Diagnosis not present

## 2021-09-04 DIAGNOSIS — M259 Joint disorder, unspecified: Secondary | ICD-10-CM | POA: Diagnosis not present

## 2021-09-04 DIAGNOSIS — G8929 Other chronic pain: Secondary | ICD-10-CM | POA: Diagnosis not present

## 2021-09-11 ENCOUNTER — Other Ambulatory Visit: Payer: Self-pay | Admitting: Nurse Practitioner

## 2021-09-12 NOTE — Telephone Encounter (Signed)
Requested Prescriptions  Pending Prescriptions Disp Refills  . atorvastatin (LIPITOR) 40 MG tablet [Pharmacy Med Name: ATORVASTATIN 40 MG TAB 40 Tablet] 90 tablet 1    Sig: TAKE 1 TABLET (40 MG TOTAL) BY MOUTH AT BEDTIME.     Cardiovascular:  Antilipid - Statins Failed - 09/11/2021  2:32 PM      Failed - Lipid Panel in normal range within the last 12 months    Cholesterol, Total  Date Value Ref Range Status  04/03/2021 176 100 - 199 mg/dL Final   LDL Chol Calc (NIH)  Date Value Ref Range Status  04/03/2021 85 0 - 99 mg/dL Final   HDL  Date Value Ref Range Status  04/03/2021 74 >39 mg/dL Final   Triglycerides  Date Value Ref Range Status  04/03/2021 93 0 - 149 mg/dL Final         Passed - Patient is not pregnant      Passed - Valid encounter within last 12 months    Recent Outpatient Visits          4 months ago Moderate episode of recurrent major depressive disorder ([place])   [place], Karen, NP   5 months ago Annual physical exam   [facility] Jon Billings, NP   11 months ago Chronic congestive heart failure, unspecified heart failure type [facility])   [facility] Jon Billings, NP   1 year ago Gastroesophageal reflux disease, unspecified whether esophagitis present   [facility] Jon Billings, NP   1 year ago Suspected COVID-19 virus infection   [facility] [place], [place], DO      Future Appointments            In 2 weeks Jon Billings, NP [facility], [place]

## 2021-09-18 DIAGNOSIS — Z96642 Presence of left artificial hip joint: Secondary | ICD-10-CM | POA: Diagnosis not present

## 2021-09-18 DIAGNOSIS — I5020 Unspecified systolic (congestive) heart failure: Secondary | ICD-10-CM | POA: Diagnosis not present

## 2021-09-18 DIAGNOSIS — S72111D Displaced fracture of greater trochanter of right femur, subsequent encounter for closed fracture with routine healing: Secondary | ICD-10-CM | POA: Diagnosis not present

## 2021-09-18 DIAGNOSIS — Z96641 Presence of right artificial hip joint: Secondary | ICD-10-CM | POA: Diagnosis not present

## 2021-09-18 DIAGNOSIS — X58XXXD Exposure to other specified factors, subsequent encounter: Secondary | ICD-10-CM | POA: Diagnosis not present

## 2021-09-18 DIAGNOSIS — Z96643 Presence of artificial hip joint, bilateral: Secondary | ICD-10-CM | POA: Diagnosis not present

## 2021-09-18 DIAGNOSIS — M25551 Pain in right hip: Secondary | ICD-10-CM | POA: Diagnosis not present

## 2021-09-18 DIAGNOSIS — S72112D Displaced fracture of greater trochanter of left femur, subsequent encounter for closed fracture with routine healing: Secondary | ICD-10-CM | POA: Diagnosis not present

## 2021-09-19 ENCOUNTER — Encounter: Payer: Self-pay | Admitting: Nurse Practitioner

## 2021-09-24 DIAGNOSIS — J449 Chronic obstructive pulmonary disease, unspecified: Secondary | ICD-10-CM | POA: Diagnosis not present

## 2021-09-24 DIAGNOSIS — Z01810 Encounter for preprocedural cardiovascular examination: Secondary | ICD-10-CM | POA: Diagnosis not present

## 2021-09-24 DIAGNOSIS — I5032 Chronic diastolic (congestive) heart failure: Secondary | ICD-10-CM | POA: Diagnosis not present

## 2021-09-24 DIAGNOSIS — R0602 Shortness of breath: Secondary | ICD-10-CM | POA: Diagnosis not present

## 2021-09-24 DIAGNOSIS — N184 Chronic kidney disease, stage 4 (severe): Secondary | ICD-10-CM | POA: Diagnosis not present

## 2021-10-01 ENCOUNTER — Ambulatory Visit: Payer: Medicare Other | Admitting: Nurse Practitioner

## 2021-10-02 DIAGNOSIS — M259 Joint disorder, unspecified: Secondary | ICD-10-CM | POA: Diagnosis not present

## 2021-10-02 DIAGNOSIS — G894 Chronic pain syndrome: Secondary | ICD-10-CM | POA: Diagnosis not present

## 2021-10-02 DIAGNOSIS — M5136 Other intervertebral disc degeneration, lumbar region: Secondary | ICD-10-CM | POA: Diagnosis not present

## 2021-10-02 DIAGNOSIS — M9904 Segmental and somatic dysfunction of sacral region: Secondary | ICD-10-CM | POA: Diagnosis not present

## 2021-10-04 ENCOUNTER — Telehealth: Payer: Self-pay | Admitting: Nurse Practitioner

## 2021-10-05 ENCOUNTER — Other Ambulatory Visit: Payer: Self-pay | Admitting: Nurse Practitioner

## 2021-10-05 ENCOUNTER — Ambulatory Visit: Payer: Self-pay | Admitting: *Deleted

## 2021-10-05 MED ORDER — ONDANSETRON 4 MG PO TBDP
4.0000 mg | ORAL_TABLET | Freq: Three times a day (TID) | ORAL | 6 refills | Status: DC | PRN
Start: 2021-10-05 — End: 2021-10-05

## 2021-10-05 MED ORDER — ONDANSETRON 4 MG PO TBDP: 4.0000 mg | ORAL_TABLET | Freq: Three times a day (TID) | ORAL | 6 refills | Status: DC | PRN

## 2021-10-05 NOTE — Telephone Encounter (Signed)
Pt states pharmacy has been out of power and inquiring if rxs can be resent   Please assist further

## 2021-10-05 NOTE — Telephone Encounter (Signed)
Requested medication (s) are due for refill today: just filled yesterday for #20  Requested medication (s) are on the active medication list: yes  Last refill:  08/10/21 #20 with 6 RF  Future visit scheduled: 10/10/21, seen 05/03/21  Notes to clinic:  This medication can not be delegated, please assess.        Requested Prescriptions  Pending Prescriptions Disp Refills   ondansetron (ZOFRAN-ODT) 4 MG disintegrating tablet [Pharmacy Med Name: ONDANSETRON ODT 4 MG TAB 4 Tablet] 20 tablet 6    Sig: TAKE 1 TABLET BY MOUTH EVERY 8 HOURS AS NEEDED FOR NAUSEA OR VOMITING.     Not Delegated - Gastroenterology: Antiemetics - ondansetron Failed - 10/04/2021  3:52 PM      Failed - This refill cannot be delegated      Failed - AST in normal range and within 360 days    AST  Date Value Ref Range Status  04/03/2021 48 (H) 0 - 40 IU/L Final   SGOT(AST)  Date Value Ref Range Status  09/14/2013 36 15 - 37 Unit/L Final         Failed - ALT in normal range and within 360 days    ALT  Date Value Ref Range Status  04/03/2021 50 (H) 0 - 32 IU/L Final   SGPT (ALT)  Date Value Ref Range Status  09/14/2013 45 U/L Final    Comment:    14-63 NOTE: New Reference Range 09/07/13          Passed - Valid encounter within last 6 months    Recent Outpatient Visits           5 months ago Moderate episode of recurrent major depressive disorder ([place])   [place], Karen, NP   6 months ago Annual physical exam   [facility] Jon Billings, NP   1 year ago Chronic congestive heart failure, unspecified heart failure type ([place])   [facility] Jon Billings, NP   1 year ago Gastroesophageal reflux disease, unspecified whether esophagitis present   [facility] Jon Billings, NP   1 year ago Suspected COVID-19 virus infection   [facility] [place], [place], DO       Future Appointments             In 5 days  Jon Billings, NP [facility], [facility]            Signed Prescriptions Disp Refills   omeprazole (PRILOSEC) 20 MG capsule 90 capsule 1    Sig: TAKE 1 CAPSULE BY MOUTH DAILY.     Gastroenterology: Proton Pump Inhibitors Passed - 10/04/2021  3:52 PM      Passed - Valid encounter within last 12 months    Recent Outpatient Visits           5 months ago Moderate episode of recurrent major depressive disorder ([place])   [facility] Jon Billings, NP   6 months ago Annual physical exam   [facility] Jon Billings, NP   1 year ago Chronic congestive heart failure, unspecified heart failure type [facility])   [facility] Jon Billings, NP   1 year ago Gastroesophageal reflux disease, unspecified whether esophagitis present   [facility] Jon Billings, NP   1 year ago Suspected COVID-19 virus infection   [facility], [place], DO       Future Appointments  In 5 days Jon Billings, NP [facility], [facility]             buPROPion (WELLBUTRIN XL) 150 MG 24 hr tablet 90 tablet 1    Sig: TAKE 1 TABLET (150 MG TOTAL) BY MOUTH DAILY.     Psychiatry: Antidepressants - bupropion Failed - 10/04/2021  3:52 PM      Failed - Cr in normal range and within 360 days    Creatinine  Date Value Ref Range Status  09/14/2013 0.88 0.60 - 1.30 mg/dL Final   Creatinine, Ser  Date Value Ref Range Status  04/03/2021 1.05 (H) 0.57 - 1.00 mg/dL Final         Failed - AST in normal range and within 360 days    AST  Date Value Ref Range Status  04/03/2021 48 (H) 0 - 40 IU/L Final   SGOT(AST)  Date Value Ref Range Status  09/14/2013 36 15 - 37 Unit/L Final         Failed - ALT in normal range and within 360 days    ALT  Date Value Ref Range Status  04/03/2021 50 (H) 0 - 32 IU/L Final   SGPT (ALT)  Date Value Ref Range Status  09/14/2013 45 U/L Final    Comment:     14-63 NOTE: New Reference Range 09/07/13          Passed - Completed PHQ-2 or PHQ-9 in the last 360 days      Passed - Last BP in normal range    BP Readings from Last 1 Encounters:  05/03/21 120/79         Passed - Valid encounter within last 6 months    Recent Outpatient Visits           5 months ago Moderate episode of recurrent major depressive disorder ([place])   [place], Karen, NP   6 months ago Annual physical exam   [facility] Jon Billings, NP   1 year ago Chronic congestive heart failure, unspecified heart failure type [facility])   [facility] Jon Billings, NP   1 year ago Gastroesophageal reflux disease, unspecified whether esophagitis present   [facility] Jon Billings, NP   1 year ago Suspected COVID-19 virus infection   [facility] [place], [place], DO       Future Appointments             In 5 days Jon Billings, NP [facility], [place]

## 2021-10-05 NOTE — Telephone Encounter (Signed)
Requested Prescriptions  Pending Prescriptions Disp Refills  . omeprazole (PRILOSEC) 20 MG capsule [Pharmacy Med Name: OMEPRAZOLE 20 MG CAP 20 Capsule] 90 capsule 1    Sig: TAKE 1 CAPSULE BY MOUTH DAILY.     Gastroenterology: Proton Pump Inhibitors Passed - 10/04/2021  3:52 PM      Passed - Valid encounter within last 12 months    Recent Outpatient Visits          5 months ago Moderate episode of recurrent major depressive disorder ([place])   [facility] Jon Billings, NP   6 months ago Annual physical exam   [facility] Jon Billings, NP   1 year ago Chronic congestive heart failure, unspecified heart failure type [facility])   [facility] Jon Billings, NP   1 year ago Gastroesophageal reflux disease, unspecified whether esophagitis present   [place], NP   1 year ago Suspected COVID-19 virus infection   [place], August, DO      Future Appointments            In 5 days Jon Billings, NP [facility]           . buPROPion (WELLBUTRIN XL) 150 MG 24 hr tablet [Pharmacy Med Name: BUPROPION HCL XL 150 MG TAB 150 Tablet] 90 tablet 1    Sig: TAKE 1 TABLET (150 MG TOTAL) BY MOUTH DAILY.     Psychiatry: Antidepressants - bupropion Failed - 10/04/2021  3:52 PM      Failed - Cr in normal range and within 360 days    Creatinine  Date Value Ref Range Status  09/14/2013 0.88 0.60 - 1.30 mg/dL Final   Creatinine, Ser  Date Value Ref Range Status  04/03/2021 1.05 (H) 0.57 - 1.00 mg/dL Final         Failed - AST in normal range and within 360 days    AST  Date Value Ref Range Status  04/03/2021 48 (H) 0 - 40 IU/L Final   SGOT(AST)  Date Value Ref Range Status  09/14/2013 36 15 - 37 Unit/L Final         Failed - ALT in normal range and within 360 days    ALT  Date Value Ref Range Status  04/03/2021 50 (H) 0 - 32 IU/L Final   SGPT (ALT)  Date Value Ref  Range Status  09/14/2013 45 U/L Final    Comment:    14-63 NOTE: New Reference Range 09/07/13          Passed - Completed PHQ-2 or PHQ-9 in the last 360 days      Passed - Last BP in normal range    BP Readings from Last 1 Encounters:  05/03/21 120/79         Passed - Valid encounter within last 6 months    Recent Outpatient Visits          5 months ago Moderate episode of recurrent major depressive disorder ([place])   [place], Karen, NP   6 months ago Annual physical exam   [facility] Jon Billings, NP   1 year ago Chronic congestive heart failure, unspecified heart failure type [facility])   [facility] Jon Billings, NP   1 year ago Gastroesophageal reflux disease, unspecified whether esophagitis present   [facility] Jon Billings, NP   1 year ago Suspected COVID-19 virus infection   [place],  Barb Merino, DO      Future Appointments            In 5 days Jon Billings, NP [facility], [facility]           . ondansetron (ZOFRAN-ODT) 4 MG disintegrating tablet [Pharmacy Med Name: ONDANSETRON ODT 4 MG TAB 4 Tablet] 20 tablet 6    Sig: TAKE 1 TABLET BY MOUTH EVERY 8 HOURS AS NEEDED FOR NAUSEA OR VOMITING.     Not Delegated - Gastroenterology: Antiemetics - ondansetron Failed - 10/04/2021  3:52 PM      Failed - This refill cannot be delegated      Failed - AST in normal range and within 360 days    AST  Date Value Ref Range Status  04/03/2021 48 (H) 0 - 40 IU/L Final   SGOT(AST)  Date Value Ref Range Status  09/14/2013 36 15 - 37 Unit/L Final         Failed - ALT in normal range and within 360 days    ALT  Date Value Ref Range Status  04/03/2021 50 (H) 0 - 32 IU/L Final   SGPT (ALT)  Date Value Ref Range Status  09/14/2013 45 U/L Final    Comment:    14-63 NOTE: New Reference Range 09/07/13          Passed - Valid encounter within last 6 months     Recent Outpatient Visits          5 months ago Moderate episode of recurrent major depressive disorder ([place])   [place], Karen, NP   6 months ago Annual physical exam   [facility] Jon Billings, NP   1 year ago Chronic congestive heart failure, unspecified heart failure type [facility])   [facility] Jon Billings, NP   1 year ago Gastroesophageal reflux disease, unspecified whether esophagitis present   [facility] Jon Billings, NP   1 year ago Suspected COVID-19 virus infection   [place], [place], DO      Future Appointments            In 5 days Jon Billings, NP [facility], [place]

## 2021-10-05 NOTE — Telephone Encounter (Signed)
Pharmacy calling to request zofran Rx to be resent to pharmacy due to a power outage and did not receive Rx. Please advise for non delegated medication.    Reason for Disposition  [1] Pharmacy calling with prescription questions AND [2] triager unable to answer question  Answer Assessment - Initial Assessment Questions 1. DRUG NAME: "What medicine do you need to have refilled?"     Zofran  2. REFILLS REMAINING: "How many refills are remaining?" (Note: The label on the medicine or pill bottle will show how many refills are remaining. If there are no refills remaining, then a renewal may be needed.)     yes 3. EXPIRATION DATE: "What is the expiration date?" (Note: The label states when the prescription will expire, and thus can no longer be refilled.)     na 4. PRESCRIBING HCP: "Who prescribed it?" Reason: If prescribed by specialist, call should be referred to that group.     PCP 5. SYMPTOMS: "Do you have any symptoms?"     Na/ power outage at pharmacy and pharmacy requesting to resend Rx request 6. PREGNANCY: "Is there any chance that you are pregnant?" "When was your last menstrual period?"     na  Protocols used: Medication Refill and Renewal Call-A-AH

## 2021-10-05 NOTE — Addendum Note (Signed)
Addended by: Valerie Roys on: 10/05/2021 04:49 PM   Modules accepted: Orders

## 2021-10-10 ENCOUNTER — Encounter: Payer: Self-pay | Admitting: Nurse Practitioner

## 2021-10-10 ENCOUNTER — Ambulatory Visit (INDEPENDENT_AMBULATORY_CARE_PROVIDER_SITE_OTHER): Payer: Medicare Other | Admitting: Nurse Practitioner

## 2021-10-10 VITALS — BP 129/83 | HR 76 | Temp 98.2°F | Wt 144.9 lb

## 2021-10-10 DIAGNOSIS — I509 Heart failure, unspecified: Secondary | ICD-10-CM | POA: Diagnosis not present

## 2021-10-10 DIAGNOSIS — I70209 Unspecified atherosclerosis of native arteries of extremities, unspecified extremity: Secondary | ICD-10-CM

## 2021-10-10 DIAGNOSIS — N184 Chronic kidney disease, stage 4 (severe): Secondary | ICD-10-CM

## 2021-10-10 DIAGNOSIS — E559 Vitamin D deficiency, unspecified: Secondary | ICD-10-CM

## 2021-10-10 DIAGNOSIS — E782 Mixed hyperlipidemia: Secondary | ICD-10-CM

## 2021-10-10 DIAGNOSIS — J449 Chronic obstructive pulmonary disease, unspecified: Secondary | ICD-10-CM | POA: Diagnosis not present

## 2021-10-10 DIAGNOSIS — I110 Hypertensive heart disease with heart failure: Secondary | ICD-10-CM | POA: Diagnosis not present

## 2021-10-10 DIAGNOSIS — F331 Major depressive disorder, recurrent, moderate: Secondary | ICD-10-CM

## 2021-10-10 MED ORDER — VORTIOXETINE HBR 20 MG PO TABS: 20.0000 mg | ORAL_TABLET | Freq: Every day | ORAL | 1 refills | Status: DC

## 2021-10-10 MED ORDER — BUDESONIDE-FORMOTEROL FUMARATE 160-4.5 MCG/ACT IN AERO
2.0000 | INHALATION_SPRAY | Freq: Two times a day (BID) | RESPIRATORY_TRACT | 6 refills | Status: AC | PRN
Start: 2021-10-10 — End: ?

## 2021-10-10 MED ORDER — OMEPRAZOLE 20 MG PO CPDR: 20.0000 mg | DELAYED_RELEASE_CAPSULE | Freq: Every day | ORAL | 1 refills | Status: DC

## 2021-10-10 MED ORDER — BUPROPION HCL ER (XL) 300 MG PO TB24
300.0000 mg | ORAL_TABLET | Freq: Every morning | ORAL | 1 refills | Status: DC
Start: 2021-10-10 — End: 2022-05-21

## 2021-10-10 MED ORDER — LOSARTAN POTASSIUM 25 MG PO TABS
25.0000 mg | ORAL_TABLET | Freq: Every day | ORAL | 1 refills | Status: DC
Start: 2021-10-10 — End: 2022-05-21

## 2021-10-10 MED ORDER — ATORVASTATIN CALCIUM 40 MG PO TABS
40.0000 mg | ORAL_TABLET | Freq: Every day | ORAL | 1 refills | Status: DC
Start: 2021-10-10 — End: 2022-04-22

## 2021-10-10 MED ORDER — BUPROPION HCL ER (XL) 150 MG PO TB24
150.0000 mg | ORAL_TABLET | Freq: Every day | ORAL | 1 refills | Status: DC
Start: 2021-10-10 — End: 2022-04-22

## 2021-10-10 NOTE — Assessment & Plan Note (Signed)
Stable, continue current medication regimen 

## 2021-10-10 NOTE — Progress Notes (Signed)
BP 129/83   Pulse 76   Temp 98.2 F (36.8 C) (Oral)   Wt 144 lb 14.4 oz (65.7 kg)   LMP 09/13/1975 (Approximate)   SpO2 98%   BMI 24.49 kg/m    Subjective:    Patient ID: Amy Rocha, female    DOB: 03-Mar-1955, 66 y.o.   MRN: [id number]  HPI: Amy Rocha is a 66 y.o. female  Chief Complaint  Patient presents with   Hypertension   Hyperlipidemia   Diabetes   Anxiety   Depression    6 month follow up    HYPERTENSION / HYPERLIPIDEMIA Satisfied with current treatment? yes Duration of hypertension: years BP monitoring frequency: not checking BP range: 120/70-80 BP medication side effects: no Past BP meds: losartan (cozaar) Duration of hyperlipidemia: years Cholesterol medication side effects: no Cholesterol supplements: none Past cholesterol medications: atorvastain (lipitor) Medication compliance: excellent compliance Aspirin: no Recent stressors: no Recurrent headaches: no Visual changes: no Palpitations: no Dyspnea: no Chest pain: no Lower extremity edema: no Dizzy/lightheaded: no  COPD COPD status: controlled Satisfied with current treatment?: yes Oxygen use: no Dyspnea frequency: no Cough frequency: no Rescue inhaler frequency:  > once a month Limitation of activity: no Productive cough: no Last Spirometry:  Pneumovax: Up to Date Influenza: Up to Date  CHRONIC KIDNEY DISEASE CKD status: controlled Medications renally dose: no Previous renal evaluation: no Pneumovax:  Up to Date Influenza Vaccine:  Up to Date  MOOD Discussed PHQ9 with patient during visit.  She feels down due to needing her 8th hip replacement.  Surgery is scheduled for October.  Feels like that will improve her mood. [place] Office Visit from 10/10/2021 in [place]  PHQ-9 Total Score 6        Pt has upcoming hip replacement surgery planned for Oct 2023. Pt reports many of her depressive thoughts and stress are related to the need for surgery.    Relevant past medical, surgical, family and social history reviewed and updated as indicated. Interim medical history since our last visit reviewed. Allergies and medications reviewed and updated.  Review of Systems  Respiratory:  Negative for cough, chest tightness and shortness of breath.   Cardiovascular:  Negative for chest pain, palpitations and leg swelling.  Neurological:  Negative for dizziness and headaches.    Per HPI unless specifically indicated above      Objective:    BP 129/83   Pulse 76   Temp 98.2 F (36.8 C) (Oral)   Wt 144 lb 14.4 oz (65.7 kg)   LMP 09/13/1975 (Approximate)   SpO2 98%   BMI 24.49 kg/m   Wt Readings from Last 3 Encounters:  10/10/21 144 lb 14.4 oz (65.7 kg)  05/03/21 152 lb 6.4 oz (69.1 kg)  04/03/21 156 lb 3.2 oz (70.9 kg)    Physical Exam Vitals and nursing note reviewed.  Constitutional:      General: She is not in acute distress.    Appearance: Normal appearance. She is normal weight. She is not ill-appearing, toxic-appearing or diaphoretic.  HENT:     Head: Normocephalic.  Eyes:     General:        Right eye: No discharge.        Left eye: No discharge.  Cardiovascular:     Rate and Rhythm: Normal rate and regular rhythm.     Heart sounds: No murmur heard. Pulmonary:     Effort: Pulmonary effort is normal. No respiratory distress.  Breath sounds: Normal breath sounds. No wheezing or rales.  Musculoskeletal:     Cervical back: Normal range of motion and neck supple.  Skin:    General: Skin is warm and dry.     Capillary Refill: Capillary refill takes less than 2 seconds.  Neurological:     General: No focal deficit present.     Mental Status: She is alert and oriented to person, place, and time. Mental status is at baseline.  Psychiatric:        Mood and Affect: Mood normal.        Behavior: Behavior normal.        Thought Content: Thought content normal.        Judgment: Judgment normal.     Results for  orders placed or performed in visit on 04/03/21  Urine Culture   Specimen: Urine   UR  Result Value Ref Range   Urine Culture, Routine Final report (A)    Organism ID, Bacteria Comment (A)   Microscopic Examination   Urine  Result Value Ref Range   WBC, UA 6-10 (A) 0 - 5 /hpf   RBC, Urine None seen 0 - 2 /hpf   Epithelial Cells (non renal) 0-10 0 - 10 /hpf   Bacteria, UA Few (A) None seen/Few  Vitamin D (25 hydroxy)  Result Value Ref Range   Vit D, 25-Hydroxy 32.8 30.0 - 100.0 ng/mL  CBC with Differential/Platelet  Result Value Ref Range   WBC 7.2 3.4 - 10.8 x10E3/uL   RBC 4.80 3.77 - 5.28 x10E6/uL   Hemoglobin 14.6 11.1 - 15.9 g/dL   Hematocrit 43.8 34.0 - 46.6 %   MCV 91 79 - 97 fL   MCH 30.4 26.6 - 33.0 pg   MCHC 33.3 31.5 - 35.7 g/dL   RDW 13.3 11.7 - 15.4 %   Platelets 327 150 - 450 x10E3/uL   Neutrophils 56 Not Estab. %   Lymphs 25 Not Estab. %   Monocytes 13 Not Estab. %   Eos 4 Not Estab. %   Basos 2 Not Estab. %   Neutrophils Absolute 4.1 1.4 - 7.0 x10E3/uL   Lymphocytes Absolute 1.8 0.7 - 3.1 x10E3/uL   Monocytes Absolute 0.9 0.1 - 0.9 x10E3/uL   EOS (ABSOLUTE) 0.3 0.0 - 0.4 x10E3/uL   Basophils Absolute 0.1 0.0 - 0.2 x10E3/uL   Immature Granulocytes 0 Not Estab. %   Immature Grans (Abs) 0.0 0.0 - 0.1 x10E3/uL  Comprehensive metabolic panel  Result Value Ref Range   Glucose 87 70 - 99 mg/dL   BUN 12 8 - 27 mg/dL   Creatinine, Ser 1.05 (H) 0.57 - 1.00 mg/dL   eGFR 58 (L) >59 mL/min/1.73   BUN/Creatinine Ratio 11 (L) 12 - 28   Sodium 142 134 - 144 mmol/L   Potassium 4.5 3.5 - 5.2 mmol/L   Chloride 105 96 - 106 mmol/L   CO2 22 20 - 29 mmol/L   Calcium 9.4 8.7 - 10.3 mg/dL   Total Protein 6.5 6.0 - 8.5 g/dL   Albumin 4.0 3.8 - 4.8 g/dL   Globulin, Total 2.5 1.5 - 4.5 g/dL   Albumin/Globulin Ratio 1.6 1.2 - 2.2   Bilirubin Total 0.3 0.0 - 1.2 mg/dL   Alkaline Phosphatase 115 44 - 121 IU/L   AST 48 (H) 0 - 40 IU/L   ALT 50 (H) 0 - 32 IU/L  Lipid panel   Result Value Ref Range   Cholesterol, Total 176 100 - 199  mg/dL   Triglycerides 93 0 - 149 mg/dL   HDL 74 >39 mg/dL   VLDL Cholesterol Cal 17 5 - 40 mg/dL   LDL Chol Calc (NIH) 85 0 - 99 mg/dL   Chol/HDL Ratio 2.4 0.0 - 4.4 ratio  TSH  Result Value Ref Range   TSH 4.410 0.450 - 4.500 uIU/mL  Urinalysis, Routine w reflex microscopic  Result Value Ref Range   Specific Gravity, UA 1.025 1.005 - 1.030   pH, UA 5.5 5.0 - 7.5   Color, UA Yellow Yellow   Appearance Ur Clear Clear   Leukocytes,UA 1+ (A) Negative   Protein,UA Negative Negative/Trace   Glucose, UA Negative Negative   Ketones, UA Negative Negative   RBC, UA Negative Negative   Bilirubin, UA Negative Negative   Urobilinogen, Ur 0.2 0.2 - 1.0 mg/dL   Nitrite, UA Negative Negative   Microscopic Examination See below:       Assessment & Plan:   Problem List Items Addressed This Visit       Cardiovascular and Mediastinum   Hypertensive heart failure ([place]) - Primary    Stable, continue current medication regimen.      Relevant Medications   atorvastatin (LIPITOR) 40 MG tablet   losartan (COZAAR) 25 MG tablet   Other Relevant Orders   Comp Met (CMET)   CHF (congestive heart failure) (HCC)    Stable, no changes to current regimen. Will continue to monitor.       Relevant Medications   atorvastatin (LIPITOR) 40 MG tablet   losartan (COZAAR) 25 MG tablet   Atherosclerotic peripheral vascular disease (HCC)    Stable, no changes to current regimen      Relevant Medications   atorvastatin (LIPITOR) 40 MG tablet   losartan (COZAAR) 25 MG tablet     Respiratory   COPD (chronic obstructive pulmonary disease) (HCC)    Stable, no changes to current regimen.      Relevant Medications   budesonide-formoterol (SYMBICORT) 160-4.5 MCG/ACT inhaler     Genitourinary   CKD (chronic kidney disease), stage IV (HCC)    Stable, no changes to current regimen. Labs drawn today, will review results with pt once processed.          Other   Hyperlipidemia    Stable, no changes to current regimen.  Labs drawn today. Will contact pt with results.      Relevant Medications   atorvastatin (LIPITOR) 40 MG tablet   losartan (COZAAR) 25 MG tablet   Other Relevant Orders   Lipid Profile   Depression    Discussed results of PHQ-9.  Pt states she feels more depressed due to need of hip surgery. Will discuss depression symptoms following surgery and whether medication adjustments are necessary. Pt does not wish to make changes today, she feels stable on current regimen.        Relevant Medications   buPROPion (WELLBUTRIN XL) 150 MG 24 hr tablet   buPROPion (WELLBUTRIN XL) 300 MG 24 hr tablet   vortioxetine HBr (TRINTELLIX) 20 MG TABS tablet   Vitamin D deficiency    No changes to current regimen. Labs drawn this visit. Will f/u with lab results with patient and make necessary adjustments if needed.       Relevant Orders   Vitamin D (25 hydroxy)     Follow up plan: Return in about 6 months (around 04/12/2022) for Physical & fasting labs.

## 2021-10-10 NOTE — Assessment & Plan Note (Signed)
Stable, no changes to current regimen.

## 2021-10-10 NOTE — Assessment & Plan Note (Addendum)
No changes to current regimen. Labs drawn this visit. Will f/u with lab results with patient and make necessary adjustments if needed.

## 2021-10-10 NOTE — Assessment & Plan Note (Signed)
Discussed results of PHQ-9.  Pt states she feels more depressed due to need of hip surgery. Will discuss depression symptoms following surgery and whether medication adjustments are necessary. Pt does not wish to make changes today, she feels stable on current regimen.

## 2021-10-10 NOTE — Assessment & Plan Note (Addendum)
Stable, no changes to current regimen. Labs drawn today, will review results with pt once processed.

## 2021-10-10 NOTE — Assessment & Plan Note (Signed)
Stable, no changes to current regimen

## 2021-10-10 NOTE — Assessment & Plan Note (Addendum)
Stable, no changes to current regimen. Will continue to monitor.

## 2021-10-10 NOTE — Assessment & Plan Note (Addendum)
Stable, no changes to current regimen.  Labs drawn today. Will contact pt with results.

## 2021-10-11 LAB — LIPID PANEL
Chol/HDL Ratio: 2.1 ratio (ref 0.0–4.4)
Cholesterol, Total: 178 mg/dL (ref 100–199)
HDL: 86 mg/dL (ref >39)
LDL Chol Calc (NIH): 78 mg/dL (ref 0–99)
Triglycerides: 74 mg/dL (ref 0–149)
VLDL Cholesterol Cal: 14 mg/dL (ref 5–40)

## 2021-10-11 LAB — COMPREHENSIVE METABOLIC PANEL
ALT: 43 IU/L — ABNORMAL HIGH (ref 0–32)
AST: 36 IU/L (ref 0–40)
Albumin/Globulin Ratio: 1.7 (ref 1.2–2.2)
Albumin: 4.2 g/dL (ref 3.9–4.9)
Alkaline Phosphatase: 104 IU/L (ref 44–121)
BUN/Creatinine Ratio: 8 — ABNORMAL LOW (ref 12–28)
BUN: 8 mg/dL (ref 8–27)
Bilirubin Total: 0.4 mg/dL (ref 0.0–1.2)
CO2: 23 mmol/L (ref 20–29)
Calcium: 9.3 mg/dL (ref 8.7–10.3)
Chloride: 104 mmol/L (ref 96–106)
Creatinine, Ser: 0.99 mg/dL (ref 0.57–1.00)
Globulin, Total: 2.5 g/dL (ref 1.5–4.5)
Glucose: 86 mg/dL (ref 70–99)
Potassium: 4.5 mmol/L (ref 3.5–5.2)
Sodium: 142 mmol/L (ref 134–144)
Total Protein: 6.7 g/dL (ref 6.0–8.5)
eGFR: 62 mL/min/{1.73_m2} (ref >59)

## 2021-10-11 LAB — VITAMIN D 25 HYDROXY (VIT D DEFICIENCY, FRACTURES): Vit D, 25-Hydroxy: 29.9 ng/mL — ABNORMAL LOW (ref 30.0–100.0)

## 2021-10-11 NOTE — Progress Notes (Signed)
Please let patient know that her lab work looks good.  Her liver and kidney function is stable.  Her Vitamin D is slightly low.  I recommend she take Vitamin D 1000 IU once daily to help with this.  Continue with other medication regimen.  Follow up as discussed.

## 2021-10-14 ENCOUNTER — Emergency Department: Payer: Medicare Other

## 2021-10-14 ENCOUNTER — Other Ambulatory Visit: Payer: Self-pay

## 2021-10-14 ENCOUNTER — Emergency Department
Admission: EM | Admit: 2021-10-14 | Discharge: 2021-10-14 | Disposition: A | Discharge status: Home / Self Care | Payer: Medicare Other | Attending: Emergency Medicine | Admitting: Emergency Medicine

## 2021-10-14 DIAGNOSIS — Z96643 Presence of artificial hip joint, bilateral: Secondary | ICD-10-CM | POA: Insufficient documentation

## 2021-10-14 DIAGNOSIS — X58XXXA Exposure to other specified factors, initial encounter: Secondary | ICD-10-CM | POA: Insufficient documentation

## 2021-10-14 DIAGNOSIS — T84021A Dislocation of internal left hip prosthesis, initial encounter: Secondary | ICD-10-CM | POA: Insufficient documentation

## 2021-10-14 DIAGNOSIS — W19XXXA Unspecified fall, initial encounter: Secondary | ICD-10-CM | POA: Diagnosis not present

## 2021-10-14 DIAGNOSIS — Z471 Aftercare following joint replacement surgery: Secondary | ICD-10-CM | POA: Diagnosis not present

## 2021-10-14 DIAGNOSIS — Z96642 Presence of left artificial hip joint: Secondary | ICD-10-CM | POA: Diagnosis not present

## 2021-10-14 DIAGNOSIS — Z743 Need for continuous supervision: Secondary | ICD-10-CM | POA: Diagnosis not present

## 2021-10-14 DIAGNOSIS — S73005A Unspecified dislocation of left hip, initial encounter: Secondary | ICD-10-CM | POA: Diagnosis not present

## 2021-10-14 DIAGNOSIS — S79912A Unspecified injury of left hip, initial encounter: Secondary | ICD-10-CM | POA: Diagnosis present

## 2021-10-14 DIAGNOSIS — M25559 Pain in unspecified hip: Secondary | ICD-10-CM | POA: Diagnosis not present

## 2021-10-14 LAB — CBC WITH DIFFERENTIAL/PLATELET
Abs Immature Granulocytes: 0.02 10*3/uL (ref 0.00–0.07)
Basophils Absolute: 0.1 10*3/uL (ref 0.0–0.1)
Basophils Relative: 1 %
Eosinophils Absolute: 0.2 10*3/uL (ref 0.0–0.5)
Eosinophils Relative: 3 %
HCT: 43.1 % (ref 36.0–46.0)
Hemoglobin: 14.2 g/dL (ref 12.0–15.0)
Immature Granulocytes: 0 %
Lymphocytes Relative: 23 %
Lymphs Abs: 1.9 10*3/uL (ref 0.7–4.0)
MCH: 30.8 pg (ref 26.0–34.0)
MCHC: 32.9 g/dL (ref 30.0–36.0)
MCV: 93.5 fL (ref 80.0–100.0)
Monocytes Absolute: 1 10*3/uL (ref 0.1–1.0)
Monocytes Relative: 13 %
Neutro Abs: 4.9 10*3/uL (ref 1.7–7.7)
Neutrophils Relative %: 60 %
Platelets: 286 10*3/uL (ref 150–400)
RBC: 4.61 MIL/uL (ref 3.87–5.11)
RDW: 13 % (ref 11.5–15.5)
WBC: 8.2 10*3/uL (ref 4.0–10.5)
nRBC: 0 % (ref 0.0–0.2)

## 2021-10-14 LAB — BASIC METABOLIC PANEL
Anion gap: 10 (ref 5–15)
BUN: 13 mg/dL (ref 8–23)
CO2: 23 mmol/L (ref 22–32)
Calcium: 9.1 mg/dL (ref 8.9–10.3)
Chloride: 105 mmol/L (ref 98–111)
Creatinine, Ser: 1.08 mg/dL — ABNORMAL HIGH (ref 0.44–1.00)
GFR, Estimated: 56 mL/min — ABNORMAL LOW (ref >60)
Glucose, Bld: 94 mg/dL (ref 70–99)
Potassium: 4.2 mmol/L (ref 3.5–5.1)
Sodium: 138 mmol/L (ref 135–145)

## 2021-10-14 MED ORDER — PROPOFOL 10 MG/ML IV BOLUS
15.0000 mg | Freq: Once | INTRAVENOUS | Status: AC
  Administered 2021-10-14: 15 mg via INTRAVENOUS

## 2021-10-14 MED ORDER — PROPOFOL 10 MG/ML IV BOLUS
17.0000 mg | Freq: Once | INTRAVENOUS | Status: AC
  Administered 2021-10-14: 17 mg via INTRAVENOUS

## 2021-10-14 MED ORDER — FENTANYL CITRATE PF 50 MCG/ML IJ SOSY
50.0000 ug | PREFILLED_SYRINGE | Freq: Once | INTRAMUSCULAR | Status: AC
  Administered 2021-10-14: 50 ug via INTRAVENOUS
  Filled 2021-10-14: qty 1

## 2021-10-14 MED ORDER — OXYCODONE-ACETAMINOPHEN 5-325 MG PO TABS
1.0000 | ORAL_TABLET | Freq: Once | ORAL | Status: AC
  Administered 2021-10-14: 1 via ORAL
  Filled 2021-10-14: qty 1

## 2021-10-14 MED ORDER — SODIUM CHLORIDE 0.9 % IV SOLN: INTRAVENOUS | Status: DC

## 2021-10-14 MED ORDER — PROPOFOL 10 MG/ML IV BOLUS
0.5000 mg/kg | Freq: Once | INTRAVENOUS | Status: AC
  Administered 2021-10-14: 32.7 mg via INTRAVENOUS
  Filled 2021-10-14: qty 20

## 2021-10-14 NOTE — ED Triage Notes (Signed)
Pt in via EMS from home with c/o hip dislocation. Pt had a left hip replacement 4 years ago and the bal fell out today and she cannot move her leg

## 2021-10-14 NOTE — ED Provider Notes (Signed)
[facility] Provider Note    Event Date/Time   First MD Initiated Contact with Patient 10/14/21 2005     (approximate)   History   Hip Pain   HPI  Amy Rocha is a 66 y.o. female who presents to the emergency department today because of concerns for left hip pain and possible dislocation.  The patient has history of bilateral hip replacements.  The left hip has been giving her issues recently and she feels like its been popping out but she has been able to get it back in on her own.  Today when she went to tie her shoes she felt a pop out and was unable to get it back in.  [facility] is [facility] and has surgery scheduled for that hip in October.  She denies any injuries she states she was sitting down when this happened.  Physical Exam   Triage Vital Signs: ED Triage Vitals  Enc Vitals Group     BP 10/14/21 1811 (!) 147/91     Pulse Rate 10/14/21 1811 87     Resp 10/14/21 1811 18     Temp 10/14/21 1811 98.4 F (36.9 C)     Temp Source 10/14/21 1811 Oral     SpO2 10/14/21 1811 97 %     Weight 10/14/21 1812 144 lb (65.3 kg)     Height --      Head Circumference --      Peak Flow --      Pain Score 10/14/21 1812 10     Pain Loc --      Pain Edu? --      Excl. in [place]? --     Most recent vital signs: Vitals:   10/14/21 1811  BP: (!) 147/91  Pulse: 87  Resp: 18  Temp: 98.4 F (36.9 C)  SpO2: 97%    General: Awake, alert, oriented. CV:  Good peripheral perfusion. Regular rate and rhythm Resp:  Normal effort. Lungs clear. Abd:  No distention.     ED Results / Procedures / Treatments   Labs (all labs ordered are listed, but only abnormal results are displayed) Labs Reviewed  BASIC METABOLIC PANEL - Abnormal; Notable for the following components:      Result Value   Creatinine, Ser 1.08 (*)    GFR, Estimated 56 (*)    All other components within normal limits  CBC WITH DIFFERENTIAL/PLATELET     EKG  None   RADIOLOGY I  independently interpreted and visualized the left hip. My interpretation: dislocation Radiology interpretation:  IMPRESSION:  1. Total left hip replacement with superolateral dislocation of the  left femoral prosthesis.  2. Intact right total hip replacement.    I independently interpreted and visualized the left hip. My interpretation: appropriate prosthetic alignment.  Radiology interpretation:  IMPRESSION:  Left hip arthroplasty is now in anatomic alignment.     PROCEDURES:  Critical Care performed: No  .Sedation  Date/Time: 10/14/2021 9:58 PM  Performed by: Nance Pear, MD Authorized by: Nance Pear, MD   Consent:    Consent obtained:  Written   Consent given by:  Patient   Risks discussed:  Nausea and respiratory compromise necessitating ventilatory assistance and intubation Universal protocol:    Immediately prior to procedure, a time out was called: yes   Pre-sedation assessment:    Time since last food or drink:  Unknown   NPO status caution: unable to specify NPO status  ASA classification: class 2 - patient with mild systemic disease     Mallampati score:  I - soft palate, uvula, fauces, pillars visible   Pre-sedation assessments completed and reviewed: airway patency, cardiovascular function, hydration status, mental status, nausea/vomiting, pain level, respiratory function and temperature   Procedure details (see MAR for exact dosages):    Total Provider sedation time (minutes):  25 Post-procedure details:    Attendance: Constant attendance by certified staff until patient recovered     Recovery: Patient returned to pre-procedure baseline     Post-sedation assessments completed and reviewed: airway patency, cardiovascular function, hydration status, mental status, nausea/vomiting, pain level and respiratory function     Procedure completion:  Tolerated well, no immediate complications   Reduction of dislocation  Performed by: Nance Pear Authorized by: Nance Pear Consent: Written consent obtained. Risks and benefits: risks, benefits and alternatives were discussed Consent given by: patient Required items: required blood products, implants, devices, and special equipment available Time out: Immediately prior to procedure a "time out" was called to verify the correct patient, procedure, equipment, support staff and site/side marked as required.  Patient sedated: Propofol  Vitals: Vital signs were monitored during sedation. Patient tolerance: Patient tolerated the procedure well with no immediate complications. Joint: left hip Reduction technique: traction     MEDICATIONS ORDERED IN ED: Medications  oxyCODONE-acetaminophen (PERCOCET/ROXICET) 5-325 MG per tablet 1 tablet (1 tablet Oral Given 10/14/21 1820)     IMPRESSION / MDM / ASSESSMENT AND PLAN / ED COURSE  I reviewed the triage vital signs and the nursing notes.                              Differential diagnosis includes, but is not limited to, left hip fracture, left hip dislocation.  Patient's presentation is most consistent with acute presentation with potential threat to life or bodily function.  Patient presented to the emergency department today because of concerns for possible left hip dislocation.  X-ray here does show left hip dislocation.  I discussed this finding with the patient.  Sedation was performed and reduction was successful.  Postreduction x-rays show good anatomic alignment.  Encourage patient to call her orthopedic doctor in the morning.   FINAL CLINICAL IMPRESSION(S) / ED DIAGNOSES   Final diagnoses:  Closed dislocation of left hip, initial encounter [facility])      Note:  This document was prepared using Dragon voice recognition software and may include unintentional dictation errors.    Nance Pear, MD 10/14/21 2201

## 2021-10-14 NOTE — ED Notes (Signed)
Discharge papers reviewed with pt ans spouse. Pt gives verbal consent to Dc

## 2021-10-17 DIAGNOSIS — J449 Chronic obstructive pulmonary disease, unspecified: Secondary | ICD-10-CM | POA: Diagnosis not present

## 2021-10-17 DIAGNOSIS — Z79899 Other long term (current) drug therapy: Secondary | ICD-10-CM | POA: Diagnosis not present

## 2021-10-23 DIAGNOSIS — Z96641 Presence of right artificial hip joint: Secondary | ICD-10-CM | POA: Diagnosis not present

## 2021-10-23 DIAGNOSIS — M24452 Recurrent dislocation, left hip: Secondary | ICD-10-CM | POA: Diagnosis not present

## 2021-10-23 DIAGNOSIS — Z96643 Presence of artificial hip joint, bilateral: Secondary | ICD-10-CM | POA: Diagnosis not present

## 2021-10-30 DIAGNOSIS — M9904 Segmental and somatic dysfunction of sacral region: Secondary | ICD-10-CM | POA: Diagnosis not present

## 2021-10-30 DIAGNOSIS — M259 Joint disorder, unspecified: Secondary | ICD-10-CM | POA: Diagnosis not present

## 2021-10-30 DIAGNOSIS — G894 Chronic pain syndrome: Secondary | ICD-10-CM | POA: Diagnosis not present

## 2021-10-30 DIAGNOSIS — M5136 Other intervertebral disc degeneration, lumbar region: Secondary | ICD-10-CM | POA: Diagnosis not present

## 2021-11-08 ENCOUNTER — Encounter: Payer: Self-pay | Admitting: Nurse Practitioner

## 2021-11-08 ENCOUNTER — Ambulatory Visit: Payer: Self-pay

## 2021-11-08 ENCOUNTER — Ambulatory Visit (INDEPENDENT_AMBULATORY_CARE_PROVIDER_SITE_OTHER): Payer: Medicare Other | Admitting: Nurse Practitioner

## 2021-11-08 VITALS — BP 134/80 | HR 88 | Temp 98.7°F | Wt 147.8 lb

## 2021-11-08 DIAGNOSIS — Z23 Encounter for immunization: Secondary | ICD-10-CM

## 2021-11-08 DIAGNOSIS — G894 Chronic pain syndrome: Secondary | ICD-10-CM | POA: Diagnosis not present

## 2021-11-08 MED ORDER — OXYCODONE HCL 20 MG PO TABS: 20.0000 mg | ORAL_TABLET | Freq: Four times a day (QID) | ORAL | 0 refills | Status: AC

## 2021-11-08 MED ORDER — OXYCODONE HCL 20 MG PO TABS: 20.0000 mg | ORAL_TABLET | ORAL | 0 refills | Status: AC | PRN

## 2021-11-08 NOTE — Telephone Encounter (Signed)
Patient takes medication 5x daily.  There is not a 5x daily option for me to click which is why I wrote it in the note.  160 tabs is correct quantity.

## 2021-11-08 NOTE — Telephone Encounter (Signed)
[facility] called and spoke with [place]. She is needed directions clarified. It states different that taking 5 daily. She said provider can update to just say takes 1 tab PO 5x/day and she would fill for #150 for 30 DS. This needs to be updated for Oct and Nov rx. Advised I would send back to provider for review on clarification.   Summary: Pharmacy needs clarification on Rx   Col[place]ta [place] from [facility] stated received two Rx for medication Oxycodone and needs clarification on directions.   [phone]      Reason for Disposition . [1] Pharmacy calling with prescription question AND [2] triager unable to answer question  Answer Assessment - Initial Assessment Questions 1. NAME of MEDICINE: "What medicine(s) are you calling about?"     oxycodone 2. QUESTION: "What is your question?" (e.g., double dose of medicine, side effect)     Needing directions updated  3. PRESCRIBER: "Who prescribed the medicine?" Reason: if prescribed by specialist, call should be referred to that group.     [name], NP  Protocols used: Medication Question Call-A-AH

## 2021-11-08 NOTE — Telephone Encounter (Signed)
Spoke with Pharmacist with [facility]. Clarified medication and quantity. No further action left

## 2021-11-08 NOTE — Progress Notes (Signed)
BP 134/80   Pulse 88   Temp 98.7 F (37.1 C) (Oral)   Wt 147 lb 12.8 oz (67 kg)   LMP 09/13/1975 (Approximate)   SpO2 97%   BMI 24.98 kg/m    Subjective:    Patient ID: Amy Rocha, female    DOB: Jun 04, 1955, 66 y.o.   MRN: [id number]  HPI: Amy Rocha is a 66 y.o. female  Chief Complaint  Patient presents with   Pain Management    Pt states her pain management provider is retiring and she does not know what to do next. States she currently sees Dr. Primus Bravo and he is retiring at the end of the month    Patient states her pain doctor retired and she will be out of pain medication at the end of the month.  She will be having another hip replacement is October 24.  She has considered weaning herself off the medication but she has been on it for 25 years and not sure how she would do.  Previously took 6 '20mg'$  tabs daily.    Relevant past medical, surgical, family and social history reviewed and updated as indicated. Interim medical history since our last visit reviewed. Allergies and medications reviewed and updated.  Review of Systems  Musculoskeletal:  Positive for back pain.       Hip pain    Per HPI unless specifically indicated above     Objective:    BP 134/80   Pulse 88   Temp 98.7 F (37.1 C) (Oral)   Wt 147 lb 12.8 oz (67 kg)   LMP 09/13/1975 (Approximate)   SpO2 97%   BMI 24.98 kg/m   Wt Readings from Last 3 Encounters:  11/08/21 147 lb 12.8 oz (67 kg)  10/14/21 144 lb (65.3 kg)  10/10/21 144 lb 14.4 oz (65.7 kg)    Physical Exam Vitals and nursing note reviewed.  Constitutional:      General: She is not in acute distress.    Appearance: Normal appearance. She is normal weight. She is not ill-appearing, toxic-appearing or diaphoretic.  HENT:     Head: Normocephalic.     Right Ear: External ear normal.     Left Ear: External ear normal.     Nose: Nose normal.     Mouth/Throat:     Mouth: Mucous membranes are moist.     Pharynx: Oropharynx  is clear.  Eyes:     General:        Right eye: No discharge.        Left eye: No discharge.     Extraocular Movements: Extraocular movements intact.     Conjunctiva/sclera: Conjunctivae normal.     Pupils: Pupils are equal, round, and reactive to light.  Cardiovascular:     Rate and Rhythm: Normal rate and regular rhythm.     Heart sounds: No murmur heard. Pulmonary:     Effort: Pulmonary effort is normal. No respiratory distress.     Breath sounds: Normal breath sounds. No wheezing or rales.  Musculoskeletal:     Cervical back: Normal range of motion and neck supple.  Skin:    General: Skin is warm and dry.     Capillary Refill: Capillary refill takes less than 2 seconds.  Neurological:     General: No focal deficit present.     Mental Status: She is alert and oriented to person, place, and time. Mental status is at baseline.  Psychiatric:  Mood and Affect: Mood normal.        Behavior: Behavior normal.        Thought Content: Thought content normal.        Judgment: Judgment normal.     Results for orders placed or performed during the hospital encounter of 10/14/21  CBC with Differential  Result Value Ref Range   WBC 8.2 4.0 - 10.5 K/uL   RBC 4.61 3.87 - 5.11 MIL/uL   Hemoglobin 14.2 12.0 - 15.0 g/dL   HCT 43.1 36.0 - 46.0 %   MCV 93.5 80.0 - 100.0 fL   MCH 30.8 26.0 - 34.0 pg   MCHC 32.9 30.0 - 36.0 g/dL   RDW 13.0 11.5 - 15.5 %   Platelets 286 150 - 400 K/uL   nRBC 0.0 0.0 - 0.2 %   Neutrophils Relative % 60 %   Neutro Abs 4.9 1.7 - 7.7 K/uL   Lymphocytes Relative 23 %   Lymphs Abs 1.9 0.7 - 4.0 K/uL   Monocytes Relative 13 %   Monocytes Absolute 1.0 0.1 - 1.0 K/uL   Eosinophils Relative 3 %   Eosinophils Absolute 0.2 0.0 - 0.5 K/uL   Basophils Relative 1 %   Basophils Absolute 0.1 0.0 - 0.1 K/uL   Immature Granulocytes 0 %   Abs Immature Granulocytes 0.02 0.00 - 0.07 K/uL  Basic metabolic panel  Result Value Ref Range   Sodium 138 135 - 145 mmol/L    Potassium 4.2 3.5 - 5.1 mmol/L   Chloride 105 98 - 111 mmol/L   CO2 23 22 - 32 mmol/L   Glucose, Bld 94 70 - 99 mg/dL   BUN 13 8 - 23 mg/dL   Creatinine, Ser 1.08 (H) 0.44 - 1.00 mg/dL   Calcium 9.1 8.9 - 10.3 mg/dL   GFR, Estimated 56 (L) >60 mL/min   Anion gap 10 5 - 15      Assessment & Plan:   Problem List Items Addressed This Visit       Other   Chronic pain syndrome - Primary    Chronic. Has been followed by Pain Management for the last 25 years.  Her pain management recently retired and did not help patients transition to new provider.  She has not been able to get a hold of anyone at office. Discussed with patient the risk of long term pain medication.   Patient is aware of the risks.  Will take over prescription while patient is getting into new pain management.  Referral placed today.  If not able to get into pain management in 6 months will discuss weaning down medication.  Refills sent for 2 months.  Follow up in December.       Relevant Medications   Oxycodone HCl 20 MG TABS (Start on 12/29/2021)   Oxycodone HCl 20 MG TABS (Start on 11/29/2021)   Other Relevant Orders   Ambulatory referral to Pain Clinic   Other Visit Diagnoses     Need for influenza vaccination       Relevant Orders   Flu Vaccine QUAD High Dose(Fluad)   Need for pneumococcal vaccine       Relevant Orders   Pneumococcal polysaccharide vaccine 23-valent greater than or equal to 2yo subcutaneous/IM        Follow up plan: Return in about 3 months (around 02/07/2022) for Before December 12, Medication Management.  A total of 30 minutes were spent on this encounter today.  When total time is documented,  this includes both the face-to-face and non-face-to-face time personally spent before, during and after the visit on the date of the encounter reviewing medication, discussing risks, and plan of care moving forward.

## 2021-11-08 NOTE — Assessment & Plan Note (Signed)
Chronic. Has been followed by Pain Management for the last 25 years.  Her pain management recently retired and did not help patients transition to new provider.  She has not been able to get a hold of anyone at office. Discussed with patient the risk of long term pain medication.   Patient is aware of the risks.  Will take over prescription while patient is getting into new pain management.  Referral placed today.  If not able to get into pain management in 6 months will discuss weaning down medication.  Refills sent for 2 months.  Follow up in December.

## 2021-11-16 ENCOUNTER — Ambulatory Visit: Payer: Self-pay

## 2021-11-16 NOTE — Telephone Encounter (Signed)
  Chief Complaint: Hip and back pain - Wants 5 mg oxycodone for break through pain Symptoms: ibid Frequency: ongoing Pertinent Negatives: Patient denies  Disposition: '[]'$ ED /'[]'$ Urgent Care (no appt availability in office) / '[]'$ Appointment(In office/virtual)/ '[]'$  [facility] [facility]/ '[]'$ Home Care/ '[]'$ Refused Recommended Disposition /'[]'$ [facility] [facility]/ '[x]'$  Follow-up with PCP Additional Notes: Pt states that she would like an Rx for 5 mg oxycodone called in for breakthrough pain. Pt states that her hip has "popped out of joint" 8 times since she was last seen. PT states that all she does is lay in bed because of pain.       Summary: hip discomfort / rx req   The patient has called to request a prescription for 5 MG oxycodone   The patient shares that they have had 8 (eight) separate issues of hip discomfort   Please contact the patient further when possible   ----- Message from Marlis Edelson sent at 11/16/2021  9:45 AM EDT -----  The patient has called to request a prescription for 5 MG oxycodone   The patient shares that they have had 8 (eight) separate issues of hip discomfort   Please contact the patient further when possible      Reason for Disposition  Prescription request for new medicine (not a refill)  Answer Assessment - Initial Assessment Questions 1. DRUG NAME: "What medicine do you need to have refilled?"     Oxycodone '5mg'$  - for break through pain 2. REFILLS REMAINING: "How many refills are remaining?" (Note: The label on the medicine or pill bottle will show how many refills are remaining. If there are no refills remaining, then a renewal may be needed.)     none 3. EXPIRATION DATE: "What is the expiration date?" (Note: The label states when the prescription will expire, and thus can no longer be refilled.)     na 4. PRESCRIBING HCP: "Who prescribed it?" Reason: If prescribed by specialist, call should be referred to that group.     na 5. SYMPTOMS: "Do you have  any symptoms?"     Pain 6. PREGNANCY: "Is there any chance that you are pregnant?" "When was your last menstrual period?"     na  Protocols used: Medication Refill and Renewal Call-A-AH

## 2021-11-16 NOTE — Telephone Encounter (Signed)
Unfortunately, due to the dose she is already on I am not able to give more pain medication.  I do not typically give the amount she is already prescribed.    I know she has gotten break through medication from Ortho before. I recommend she call their office.

## 2021-11-16 NOTE — Telephone Encounter (Signed)
Patient has verbalized understanding.  

## 2021-11-26 DIAGNOSIS — M24452 Recurrent dislocation, left hip: Secondary | ICD-10-CM | POA: Insufficient documentation

## 2021-11-26 DIAGNOSIS — E782 Mixed hyperlipidemia: Secondary | ICD-10-CM | POA: Diagnosis not present

## 2021-11-26 DIAGNOSIS — K219 Gastro-esophageal reflux disease without esophagitis: Secondary | ICD-10-CM | POA: Diagnosis not present

## 2021-11-26 DIAGNOSIS — Z01818 Encounter for other preprocedural examination: Secondary | ICD-10-CM | POA: Insufficient documentation

## 2021-11-26 DIAGNOSIS — I10 Essential (primary) hypertension: Secondary | ICD-10-CM | POA: Diagnosis not present

## 2021-11-27 DIAGNOSIS — Z0000 Encounter for general adult medical examination without abnormal findings: Secondary | ICD-10-CM | POA: Diagnosis not present

## 2021-11-27 DIAGNOSIS — Z6824 Body mass index (BMI) 24.0-24.9, adult: Secondary | ICD-10-CM | POA: Diagnosis not present

## 2021-11-27 DIAGNOSIS — M129 Arthropathy, unspecified: Secondary | ICD-10-CM | POA: Diagnosis not present

## 2021-11-27 DIAGNOSIS — M25552 Pain in left hip: Secondary | ICD-10-CM | POA: Diagnosis not present

## 2021-11-27 DIAGNOSIS — M545 Low back pain, unspecified: Secondary | ICD-10-CM | POA: Diagnosis not present

## 2021-11-27 DIAGNOSIS — M25551 Pain in right hip: Secondary | ICD-10-CM | POA: Diagnosis not present

## 2021-11-27 DIAGNOSIS — I10 Essential (primary) hypertension: Secondary | ICD-10-CM | POA: Diagnosis not present

## 2021-11-27 DIAGNOSIS — E559 Vitamin D deficiency, unspecified: Secondary | ICD-10-CM | POA: Diagnosis not present

## 2021-11-27 DIAGNOSIS — Z79899 Other long term (current) drug therapy: Secondary | ICD-10-CM | POA: Diagnosis not present

## 2021-11-27 DIAGNOSIS — R5383 Other fatigue: Secondary | ICD-10-CM | POA: Diagnosis not present

## 2021-11-27 DIAGNOSIS — G8929 Other chronic pain: Secondary | ICD-10-CM | POA: Diagnosis not present

## 2021-11-27 DIAGNOSIS — Z1159 Encounter for screening for other viral diseases: Secondary | ICD-10-CM | POA: Diagnosis not present

## 2021-11-27 DIAGNOSIS — Z9181 History of falling: Secondary | ICD-10-CM | POA: Diagnosis not present

## 2021-11-28 DIAGNOSIS — M259 Joint disorder, unspecified: Secondary | ICD-10-CM | POA: Diagnosis not present

## 2021-11-28 DIAGNOSIS — G894 Chronic pain syndrome: Secondary | ICD-10-CM | POA: Diagnosis not present

## 2021-11-28 DIAGNOSIS — M5136 Other intervertebral disc degeneration, lumbar region: Secondary | ICD-10-CM | POA: Diagnosis not present

## 2021-11-28 DIAGNOSIS — M9904 Segmental and somatic dysfunction of sacral region: Secondary | ICD-10-CM | POA: Diagnosis not present

## 2021-11-30 ENCOUNTER — Emergency Department: Payer: Medicare Other

## 2021-11-30 ENCOUNTER — Emergency Department
Admission: EM | Admit: 2021-11-30 | Discharge: 2021-11-30 | Disposition: A | Discharge status: Home / Self Care | Payer: Medicare Other | Attending: Student in an Organized Health Care Education/Training Program | Admitting: Student in an Organized Health Care Education/Training Program

## 2021-11-30 ENCOUNTER — Other Ambulatory Visit: Payer: Self-pay

## 2021-11-30 DIAGNOSIS — X509XXA Other and unspecified overexertion or strenuous movements or postures, initial encounter: Secondary | ICD-10-CM | POA: Diagnosis not present

## 2021-11-30 DIAGNOSIS — I10 Essential (primary) hypertension: Secondary | ICD-10-CM | POA: Diagnosis not present

## 2021-11-30 DIAGNOSIS — T84021A Dislocation of internal left hip prosthesis, initial encounter: Secondary | ICD-10-CM | POA: Diagnosis not present

## 2021-11-30 DIAGNOSIS — Z471 Aftercare following joint replacement surgery: Secondary | ICD-10-CM | POA: Diagnosis not present

## 2021-11-30 DIAGNOSIS — S73005A Unspecified dislocation of left hip, initial encounter: Secondary | ICD-10-CM | POA: Insufficient documentation

## 2021-11-30 DIAGNOSIS — Z743 Need for continuous supervision: Secondary | ICD-10-CM | POA: Diagnosis not present

## 2021-11-30 DIAGNOSIS — M23342 Other meniscus derangements, anterior horn of lateral meniscus, left knee: Secondary | ICD-10-CM | POA: Diagnosis not present

## 2021-11-30 DIAGNOSIS — S79919A Unspecified injury of unspecified hip, initial encounter: Secondary | ICD-10-CM | POA: Diagnosis not present

## 2021-11-30 DIAGNOSIS — M25552 Pain in left hip: Secondary | ICD-10-CM | POA: Diagnosis not present

## 2021-11-30 DIAGNOSIS — Z96642 Presence of left artificial hip joint: Secondary | ICD-10-CM | POA: Diagnosis not present

## 2021-11-30 MED ORDER — KETAMINE HCL 50 MG/5ML IJ SOSY
PREFILLED_SYRINGE | INTRAMUSCULAR | Status: AC
  Administered 2021-11-30: 30 mg via INTRAVENOUS
  Filled 2021-11-30: qty 5

## 2021-11-30 MED ORDER — PROPOFOL 10 MG/ML IV BOLUS
INTRAVENOUS | Status: AC | PRN
  Administered 2021-11-30: 30 mg via INTRAVENOUS

## 2021-11-30 MED ORDER — HYDROMORPHONE HCL 1 MG/ML IJ SOLN
1.0000 mg | Freq: Once | INTRAMUSCULAR | Status: AC
  Administered 2021-11-30: 1 mg via INTRAVENOUS
  Filled 2021-11-30: qty 1

## 2021-11-30 MED ORDER — MORPHINE SULFATE (PF) 4 MG/ML IV SOLN: 4.0000 mg | INTRAVENOUS | Status: DC | PRN

## 2021-11-30 MED ORDER — OXYCODONE-ACETAMINOPHEN 5-325 MG PO TABS
1.0000 | ORAL_TABLET | Freq: Once | ORAL | Status: AC
  Administered 2021-11-30: 1 via ORAL
  Filled 2021-11-30: qty 1

## 2021-11-30 MED ORDER — KETAMINE HCL 50 MG/5ML IJ SOSY: 1.0000 mg/kg | PREFILLED_SYRINGE | Freq: Once | INTRAMUSCULAR | Status: AC

## 2021-11-30 MED ORDER — ONDANSETRON HCL 4 MG/2ML IJ SOLN
4.0000 mg | Freq: Once | INTRAMUSCULAR | Status: AC
  Administered 2021-11-30: 4 mg via INTRAVENOUS
  Filled 2021-11-30: qty 2

## 2021-11-30 MED ORDER — KETAMINE HCL 10 MG/ML IJ SOLN
INTRAMUSCULAR | Status: AC | PRN
  Administered 2021-11-30: 30 mg via INTRAVENOUS

## 2021-11-30 MED ORDER — PROPOFOL 10 MG/ML IV BOLUS
0.5000 mg/kg | Freq: Once | INTRAVENOUS | Status: DC
  Filled 2021-11-30: qty 20

## 2021-11-30 NOTE — ED Notes (Signed)
NO PEN PAD attached to computer for MSE form. Pt provides verbal understanding/consent of MSE

## 2021-11-30 NOTE — ED Triage Notes (Signed)
First nurse note- Pt to triage via [facility] with c/o hip pain to left side. Pt reports hip popped out of place this evening and was unable to pop back into place. Pt has hx of same, multiple episodes and multiple surgeries for same. Per EMS pt reports total of 12 surgeries to hip. Has scheduled hip surgery on 10/24

## 2021-11-30 NOTE — ED Provider Triage Note (Signed)
Emergency Medicine Provider Triage Evaluation Note  Amy Rocha , a 66 y.o. female  was evaluated in triage.  Pt complains of dislocated hip, states happens a lot and is suppose to get hip replaced at [place].  Review of Systems  Positive:  Negative:   Physical Exam  BP 108/80 (BP Location: Left Arm)   Pulse 87   Temp 97.8 F (36.6 C) (Oral)   Resp 18   Ht '5\' 4"'$  (1.626 m)   Wt 62.1 kg   LMP 09/13/1975 (Approximate)   SpO2 93%   BMI 23.52 kg/m  Gen:   Awake, no distress   Resp:  Normal effort  MSK:   Distal n/v intact Other:    Medical Decision Making  Medically screening exam initiated at 7:35 AM.  Appropriate orders placed.  Amy Rocha was informed that the remainder of the evaluation will be completed by another provider, this initial triage assessment does not replace that evaluation, and the importance of remaining in the ED until their evaluation is complete.  Pt given percocet in triage   Amy Starks, PA-C 11/30/21 [phone]

## 2021-11-30 NOTE — ED Triage Notes (Signed)
See charted triage note by first nurse.   Pt reports while getting out of bed L hip "popped out" and pt unable to put back into place. Hx of same and pt reports scheduled sx for later this month. Pt has had sx for this prior as well. Pt denies fall. Pt able to wiggle toes. Denies loss or change in sensation. Pedal pulse palpable. Cap refill WNL.

## 2021-11-30 NOTE — ED Provider Notes (Signed)
[facility] Provider Note    Event Date/Time   First MD Initiated Contact with Patient 11/30/21 [phone]     (approximate)   History   No chief complaint on file.   HPI  Amy Rocha is a 66 y.o. female with a history of recurrent left hip dislocation status post total hip with plans for outpatient revision the end of October presents to the ER for evaluation of left hip pain and suspected dislocation after she was moving getting out of bed and felt a pop.  Denies any fall.  No head injury.  Denies any other symptoms no numbness or tingling.     Physical Exam   Triage Vital Signs: ED Triage Vitals  Enc Vitals Group     BP 11/30/21 0544 108/80     Pulse Rate 11/30/21 0544 87     Resp 11/30/21 0544 18     Temp 11/30/21 0544 97.8 F (36.6 C)     Temp Source 11/30/21 0544 Oral     SpO2 11/30/21 0544 93 %     Weight 11/30/21 0549 137 lb (62.1 kg)     Height 11/30/21 0549 '5\' 4"'$  (1.626 m)     Head Circumference --      Peak Flow --      Pain Score 11/30/21 0549 9     Pain Loc --      Pain Edu? --      Excl. in [place]? --     Most recent vital signs: Vitals:   11/30/21 1008 11/30/21 1016  BP: (!) 138/116 (!) 155/83  Pulse: 86 85  Resp: 17 20  Temp:    SpO2: 94% 99%     Constitutional: Alert  Eyes: Conjunctivae are normal.  Head: Atraumatic. Nose: No congestion/rhinnorhea. Mouth/Throat: Mucous membranes are moist.   Neck: Painless ROM.  Cardiovascular:   Good peripheral circulation. Respiratory: Normal respiratory effort.  No retractions.  Gastrointestinal: Soft and nontender.  Musculoskeletal:  no deformity Neurologic:  MAE spontaneously. No gross focal neurologic deficits are appreciated.  Skin:  Skin is warm, dry and intact. No rash noted. Psychiatric: Mood and affect are normal. Speech and behavior are normal.    ED Results / Procedures / Treatments   Labs (all labs ordered are listed, but only abnormal results are  displayed) Labs Reviewed - No data to display   EKG  ED ECG REPORT I, Merlyn Lot, the attending physician, personally viewed and interpreted this ECG.   Date: 11/30/2021  EKG Time: 9:32  Rate: 80  Rhythm: sinus  Axis: normal  Intervals: normal  ST&T Change: no stemi    RADIOLOGY Please see ED Course for my review and interpretation.  I personally reviewed all radiographic images ordered to evaluate for the above acute complaints and reviewed radiology reports and findings.  These findings were personally discussed with the patient.  Please see medical record for radiology report.    PROCEDURES:  Critical Care performed: No  .Ortho Injury Treatment  Date/Time: 11/30/2021 10:15 AM  Performed by: Merlyn Lot, MD Authorized by: Merlyn Lot, MD   Consent:    Consent obtained:  Written   Consent given by:  Patient   Risks discussed:  Irreducible dislocation, fracture, restricted joint movement, stiffness, recurrent dislocation, nerve damage and vascular damage   Alternatives discussed:  No treatment and alternative treatmentInjury location: hip Location details: left hip Injury type: dislocation Dislocation type: posterior Spontaneous dislocation: yes Prosthesis: yes Pre-procedure neurovascular assessment: neurovascularly intact  Patient sedated: Yes. Refer to sedation procedure documentation for details of sedation. Manipulation performed: yes Reduction method: traction and counter traction Reduction successful: yes X-ray confirmed reduction: yes Immobilization: splint and crutches Splint Applied by: ED Provider Post-procedure neurovascular assessment: post-procedure neurovascularly intact   .Sedation  Date/Time: 11/30/2021 10:16 AM  Performed by: Merlyn Lot, MD Authorized by: Merlyn Lot, MD   Consent:    Consent obtained:  Written   Consent given by:  Patient Universal protocol:    Immediately prior to procedure, a time  out was called: yes   Pre-sedation assessment:    Time since last food or drink:  6   ASA classification: class 1 - normal, healthy patient     Mallampati score:  I - soft palate, uvula, fauces, pillars visible   Pre-sedation assessments completed and reviewed: airway patency, cardiovascular function, hydration status, mental status, nausea/vomiting, pain level, respiratory function and temperature   Procedure details (see MAR for exact dosages):    Total Provider sedation time (minutes):  13 Post-procedure details:    Attendance: Constant attendance by certified staff until patient recovered     Recovery: Patient returned to pre-procedure baseline     Procedure completion:  Tolerated well, no immediate complications    MEDICATIONS ORDERED IN ED: Medications  propofol (DIPRIVAN) 10 mg/mL bolus/IV push 31.1 mg (has no administration in time range)  morphine (PF) 4 MG/ML injection 4 mg (has no administration in time range)  oxyCODONE-acetaminophen (PERCOCET/ROXICET) 5-325 MG per tablet 1 tablet (1 tablet Oral Given 11/30/21 0732)  HYDROmorphone (DILAUDID) injection 1 mg (1 mg Intravenous Given 11/30/21 0951)  ketamine 50 mg in normal saline 5 mL (10 mg/mL) syringe (30 mg Intravenous Given 11/30/21 1003)  ondansetron (ZOFRAN) injection 4 mg (4 mg Intravenous Given 11/30/21 0950)  ketamine (KETALAR) injection (30 mg Intravenous Given 11/30/21 1003)  propofol (DIPRIVAN) 10 mg/mL bolus/IV push (30 mg Intravenous Given 11/30/21 1004)     IMPRESSION / MDM / ASSESSMENT AND PLAN / ED COURSE  I reviewed the triage vital signs and the nursing notes.                              Differential diagnosis includes, but is not limited to, fracture, dislocation, contusion  Presented the ER for evaluation of recurrent left hip dislocation.  X-ray of ordered out of triage and on my review and interpretation does show evidence of posterior superior dislocation of the left hip.  She is neurovascularly  intact.  Will order IV morphine for pain.  We will plan sedation with reduction.   Clinical Course as of 11/30/21 1036  Fri Nov 30, 2021  1017 Tolerated procedural sedation for closed reduction well without complication.  Post reduction x-ray on my review and interpretation with interval reduction of left hip dislocation.  Patient observing clinically stable and appropriate for close outpatient follow-up with Ortho. [PR]    Clinical Course User Index [PR] Merlyn Lot, MD    FINAL CLINICAL IMPRESSION(S) / ED DIAGNOSES   Final diagnoses:  Dislocation of left hip, initial encounter [facility])     Rx / DC Orders   ED Discharge Orders     None        Note:  This document was prepared using Dragon voice recognition software and may include unintentional dictation errors.    Merlyn Lot, MD 11/30/21 1036

## 2021-11-30 NOTE — Discharge Instructions (Signed)
Follow-up with Ortho.  Use crutches or walker for assistance. wear immobilizer until cleared by Ortho.  Return for any additional questions or concerns.

## 2021-12-04 ENCOUNTER — Telehealth: Payer: Self-pay | Admitting: *Deleted

## 2021-12-04 ENCOUNTER — Telehealth: Payer: Self-pay | Admitting: Nurse Practitioner

## 2021-12-04 NOTE — Telephone Encounter (Signed)
Can we call the pharmacy to cancel the prescriptions I sent it.

## 2021-12-04 NOTE — Telephone Encounter (Signed)
Copied from [place] [id number]. Topic: General - Inquiry >> Dec 04, 2021  1:45 PM Amy Rocha wrote: Reason for CRM: Pt stated PCP [name] called her in two months of medication, Oxycodone HCl 20 MG TABS. Pt stated she does not need Rx and will not be picking up stated Dr. Burtis Junes, M.D at the pain clinic decided to stay open, and she is going to continue to go see him.  No call back needed.

## 2021-12-04 NOTE — Telephone Encounter (Signed)
     Patient  visit on 11/27/2021  at [facility] ed was for hip issues   Have you been able to follow up with your primary care physician? Patient is going for a hip replacement   The patient was or was not able to obtain any needed medicine or equipment.  Are there diet recommendations that you are having difficulty following?  Patient expresses understanding of discharge instructions and education provided has no other needs at this time.    [place] [phone] [address] , [place] [address] Email : [email or web address]

## 2021-12-04 NOTE — Telephone Encounter (Signed)
Medications have been cancelled. No further action needed.

## 2021-12-11 DIAGNOSIS — T84091A Other mechanical complication of internal left hip prosthesis, initial encounter: Secondary | ICD-10-CM | POA: Diagnosis not present

## 2021-12-11 DIAGNOSIS — Z96642 Presence of left artificial hip joint: Secondary | ICD-10-CM | POA: Diagnosis not present

## 2021-12-11 DIAGNOSIS — Z881 Allergy status to other antibiotic agents status: Secondary | ICD-10-CM | POA: Diagnosis not present

## 2021-12-11 DIAGNOSIS — M24452 Recurrent dislocation, left hip: Secondary | ICD-10-CM | POA: Diagnosis not present

## 2021-12-11 DIAGNOSIS — T84011A Broken internal left hip prosthesis, initial encounter: Secondary | ICD-10-CM | POA: Diagnosis not present

## 2021-12-11 DIAGNOSIS — I129 Hypertensive chronic kidney disease with stage 1 through stage 4 chronic kidney disease, or unspecified chronic kidney disease: Secondary | ICD-10-CM | POA: Diagnosis not present

## 2021-12-11 DIAGNOSIS — Z96643 Presence of artificial hip joint, bilateral: Secondary | ICD-10-CM | POA: Diagnosis not present

## 2021-12-11 DIAGNOSIS — G8918 Other acute postprocedural pain: Secondary | ICD-10-CM | POA: Diagnosis not present

## 2021-12-11 DIAGNOSIS — Z79899 Other long term (current) drug therapy: Secondary | ICD-10-CM | POA: Diagnosis not present

## 2021-12-11 DIAGNOSIS — M7061 Trochanteric bursitis, right hip: Secondary | ICD-10-CM | POA: Diagnosis not present

## 2021-12-11 DIAGNOSIS — E782 Mixed hyperlipidemia: Secondary | ICD-10-CM | POA: Diagnosis not present

## 2021-12-11 DIAGNOSIS — E861 Hypovolemia: Secondary | ICD-10-CM | POA: Diagnosis not present

## 2021-12-11 DIAGNOSIS — Z888 Allergy status to other drugs, medicaments and biological substances status: Secondary | ICD-10-CM | POA: Diagnosis not present

## 2021-12-11 DIAGNOSIS — I10 Essential (primary) hypertension: Secondary | ICD-10-CM | POA: Diagnosis not present

## 2021-12-11 DIAGNOSIS — F32A Depression, unspecified: Secondary | ICD-10-CM | POA: Diagnosis not present

## 2021-12-11 DIAGNOSIS — N189 Chronic kidney disease, unspecified: Secondary | ICD-10-CM | POA: Diagnosis not present

## 2021-12-11 DIAGNOSIS — M810 Age-related osteoporosis without current pathological fracture: Secondary | ICD-10-CM | POA: Diagnosis not present

## 2021-12-11 DIAGNOSIS — D631 Anemia in chronic kidney disease: Secondary | ICD-10-CM | POA: Diagnosis not present

## 2021-12-11 DIAGNOSIS — X58XXXD Exposure to other specified factors, subsequent encounter: Secondary | ICD-10-CM | POA: Diagnosis not present

## 2021-12-11 DIAGNOSIS — S72112D Displaced fracture of greater trochanter of left femur, subsequent encounter for closed fracture with routine healing: Secondary | ICD-10-CM | POA: Diagnosis not present

## 2021-12-11 DIAGNOSIS — K219 Gastro-esophageal reflux disease without esophagitis: Secondary | ICD-10-CM | POA: Diagnosis not present

## 2021-12-12 DIAGNOSIS — G8918 Other acute postprocedural pain: Secondary | ICD-10-CM | POA: Insufficient documentation

## 2021-12-17 ENCOUNTER — Telehealth: Payer: Self-pay | Admitting: *Deleted

## 2021-12-17 NOTE — Patient Outreach (Signed)
  Care Coordination [facility] Note Transition Care Management Follow-up Telephone Call Date of discharge and from where: [phone] How have you been since you were released from the hospital? I am doing pretty good Any questions or concerns? Yes Questions on if the prevena suction is down in the wound or laying on top. When its time to remove it.  RN walked her through the full process and what to expect.  Items Reviewed: Did the pt receive and understand the discharge instructions provided? Yes  Medications obtained and verified? Yes  Other? Y Patient was unsure what Meloxicam is. RN went over NSAIDS RN went over monitoring her BP daily since had episode of low  BP in hospital RN discussed that pain medications can decrease blood pressure so to be aware.  Any new allergies since your discharge? No  Dietary orders reviewed? No Do you have support at home? Yes   Home Care and Equipment/Supplies: Were home health services ordered? not applicable If so, what is the name of the agency? N  Has the agency set up a time to come to the patient's home? not applicable Were any new equipment or medical supplies ordered?  No What is the name of the medical supply agency? N  Were you able to get the supplies/equipment? not applicable Do you have any questions related to the use of the equipment or supplies? No  Functional Questionnaire: (I = Independent and D = Dependent) ADLs: I  Bathing/Dressing- I  Meal Prep- D  Eating- I  Maintaining continence- I  Transferring/Ambulation- I  Managing Meds- I  Follow up appointments reviewed:  PCP Hospital f/u appt confirmed? No  Patient will call herself and make the appointment Specialist Hospital f/u appt confirmed? Yes  Scheduled to see Fransisca Connors 12/25/2021 10:45 Florian Buff 01/28/2022 11:30  Are transportation arrangements needed? No  If their condition worsens, is the pt aware to call PCP or go to the Emergency Dept.? Yes Was the  patient provided with contact information for the PCP's office or ED? Yes Was to pt encouraged to call back with questions or concerns? Yes  SDOH assessments and interventions completed:   Yes  Care Coordination Interventions Activated:  Yes   Care Coordination Interventions:  No Care Coordination interventions needed at this time.   Encounter Outcome:  Pt. Visit Completed    [place] [facility] [phone]

## 2021-12-26 DIAGNOSIS — M169 Osteoarthritis of hip, unspecified: Secondary | ICD-10-CM | POA: Diagnosis not present

## 2021-12-26 DIAGNOSIS — G894 Chronic pain syndrome: Secondary | ICD-10-CM | POA: Diagnosis not present

## 2021-12-26 DIAGNOSIS — M259 Joint disorder, unspecified: Secondary | ICD-10-CM | POA: Diagnosis not present

## 2021-12-26 DIAGNOSIS — M5136 Other intervertebral disc degeneration, lumbar region: Secondary | ICD-10-CM | POA: Diagnosis not present

## 2021-12-26 DIAGNOSIS — M9904 Segmental and somatic dysfunction of sacral region: Secondary | ICD-10-CM | POA: Diagnosis not present

## 2022-01-17 ENCOUNTER — Other Ambulatory Visit: Payer: Self-pay | Admitting: Nurse Practitioner

## 2022-01-18 NOTE — Telephone Encounter (Signed)
Refused Wellbutrin 300 mg and Cozaar because requested too soon.

## 2022-01-28 ENCOUNTER — Ambulatory Visit: Payer: Medicare Other | Admitting: Nurse Practitioner

## 2022-01-30 DIAGNOSIS — Z20822 Contact with and (suspected) exposure to covid-19: Secondary | ICD-10-CM | POA: Diagnosis not present

## 2022-02-04 NOTE — Progress Notes (Unsigned)
LMP 09/13/1975 (Approximate)    Subjective:    Patient ID: Amy Rocha, female    DOB: 09-08-1955, 66 y.o.   MRN: [id number]  HPI: Amy Rocha is a 66 y.o. female  No chief complaint on file.  HYPERTENSION / HYPERLIPIDEMIA Satisfied with current treatment? yes Duration of hypertension: years BP monitoring frequency: not checking BP range: 120/70-80 BP medication side effects: no Past BP meds: losartan (cozaar) Duration of hyperlipidemia: years Cholesterol medication side effects: no Cholesterol supplements: none Past cholesterol medications: atorvastain (lipitor) Medication compliance: excellent compliance Aspirin: no Recent stressors: no Recurrent headaches: no Visual changes: no Palpitations: no Dyspnea: no Chest pain: no Lower extremity edema: no Dizzy/lightheaded: no  COPD COPD status: controlled Satisfied with current treatment?: yes Oxygen use: no Dyspnea frequency: no Cough frequency: no Rescue inhaler frequency:  > once a month Limitation of activity: no Productive cough: no Last Spirometry:  Pneumovax: Up to Date Influenza: Up to Date  CHRONIC KIDNEY DISEASE CKD status: controlled Medications renally dose: no Previous renal evaluation: no Pneumovax:  Up to Date Influenza Vaccine:  Up to Date  MOOD Discussed PHQ9 with patient during visit.  She feels down due to needing her 8th hip replacement.  Surgery is scheduled for October.  Feels like that will improve her mood. [facility] Office Visit from 11/08/2021 in [place]  PHQ-9 Total Score 8       Pt has upcoming hip replacement surgery planned for Oct 2023. Pt reports many of her depressive thoughts and stress are related to the need for surgery.   Relevant past medical, surgical, family and social history reviewed and updated as indicated. Interim medical history since our last visit reviewed. Allergies and medications reviewed and updated.  Review of Systems   Respiratory:  Negative for cough, chest tightness and shortness of breath.   Cardiovascular:  Negative for chest pain, palpitations and leg swelling.  Neurological:  Negative for dizziness and headaches.    Per HPI unless specifically indicated above      Objective:    LMP 09/13/1975 (Approximate)   Wt Readings from Last 3 Encounters:  11/30/21 137 lb (62.1 kg)  11/08/21 147 lb 12.8 oz (67 kg)  10/14/21 144 lb (65.3 kg)    Physical Exam Vitals and nursing note reviewed.  Constitutional:      General: She is not in acute distress.    Appearance: Normal appearance. She is normal weight. She is not ill-appearing, toxic-appearing or diaphoretic.  HENT:     Head: Normocephalic.  Eyes:     General:        Right eye: No discharge.        Left eye: No discharge.  Cardiovascular:     Rate and Rhythm: Normal rate and regular rhythm.     Heart sounds: No murmur heard. Pulmonary:     Effort: Pulmonary effort is normal. No respiratory distress.     Breath sounds: Normal breath sounds. No wheezing or rales.  Musculoskeletal:     Cervical back: Normal range of motion and neck supple.  Skin:    General: Skin is warm and dry.     Capillary Refill: Capillary refill takes less than 2 seconds.  Neurological:     General: No focal deficit present.     Mental Status: She is alert and oriented to person, place, and time. Mental status is at baseline.  Psychiatric:        Mood and Affect: Mood normal.  Behavior: Behavior normal.        Thought Content: Thought content normal.        Judgment: Judgment normal.    Results for orders placed or performed during the hospital encounter of 10/14/21  CBC with Differential  Result Value Ref Range   WBC 8.2 4.0 - 10.5 K/uL   RBC 4.61 3.87 - 5.11 MIL/uL   Hemoglobin 14.2 12.0 - 15.0 g/dL   HCT 43.1 36.0 - 46.0 %   MCV 93.5 80.0 - 100.0 fL   MCH 30.8 26.0 - 34.0 pg   MCHC 32.9 30.0 - 36.0 g/dL   RDW 13.0 11.5 - 15.5 %   Platelets 286  150 - 400 K/uL   nRBC 0.0 0.0 - 0.2 %   Neutrophils Relative % 60 %   Neutro Abs 4.9 1.7 - 7.7 K/uL   Lymphocytes Relative 23 %   Lymphs Abs 1.9 0.7 - 4.0 K/uL   Monocytes Relative 13 %   Monocytes Absolute 1.0 0.1 - 1.0 K/uL   Eosinophils Relative 3 %   Eosinophils Absolute 0.2 0.0 - 0.5 K/uL   Basophils Relative 1 %   Basophils Absolute 0.1 0.0 - 0.1 K/uL   Immature Granulocytes 0 %   Abs Immature Granulocytes 0.02 0.00 - 0.07 K/uL  Basic metabolic panel  Result Value Ref Range   Sodium 138 135 - 145 mmol/L   Potassium 4.2 3.5 - 5.1 mmol/L   Chloride 105 98 - 111 mmol/L   CO2 23 22 - 32 mmol/L   Glucose, Bld 94 70 - 99 mg/dL   BUN 13 8 - 23 mg/dL   Creatinine, Ser 1.08 (H) 0.44 - 1.00 mg/dL   Calcium 9.1 8.9 - 10.3 mg/dL   GFR, Estimated 56 (L) >60 mL/min   Anion gap 10 5 - 15      Assessment & Plan:   Problem List Items Addressed This Visit   None    Follow up plan: No follow-ups on file.

## 2022-02-05 ENCOUNTER — Telehealth: Payer: Self-pay | Admitting: Nurse Practitioner

## 2022-02-05 ENCOUNTER — Ambulatory Visit (INDEPENDENT_AMBULATORY_CARE_PROVIDER_SITE_OTHER): Payer: Medicare Other | Admitting: Nurse Practitioner

## 2022-02-05 ENCOUNTER — Encounter: Payer: Self-pay | Admitting: Nurse Practitioner

## 2022-02-05 VITALS — BP 127/84 | HR 83 | Temp 97.9°F | Wt 140.6 lb

## 2022-02-05 DIAGNOSIS — J189 Pneumonia, unspecified organism: Secondary | ICD-10-CM

## 2022-02-05 DIAGNOSIS — D649 Anemia, unspecified: Secondary | ICD-10-CM | POA: Diagnosis not present

## 2022-02-05 DIAGNOSIS — J449 Chronic obstructive pulmonary disease, unspecified: Secondary | ICD-10-CM

## 2022-02-05 DIAGNOSIS — T84010D Broken internal right hip prosthesis, subsequent encounter: Secondary | ICD-10-CM

## 2022-02-05 DIAGNOSIS — Z1231 Encounter for screening mammogram for malignant neoplasm of breast: Secondary | ICD-10-CM | POA: Diagnosis not present

## 2022-02-05 DIAGNOSIS — N184 Chronic kidney disease, stage 4 (severe): Secondary | ICD-10-CM

## 2022-02-05 DIAGNOSIS — I10 Essential (primary) hypertension: Secondary | ICD-10-CM

## 2022-02-05 DIAGNOSIS — E559 Vitamin D deficiency, unspecified: Secondary | ICD-10-CM

## 2022-02-05 DIAGNOSIS — I70209 Unspecified atherosclerosis of native arteries of extremities, unspecified extremity: Secondary | ICD-10-CM | POA: Diagnosis not present

## 2022-02-05 DIAGNOSIS — I509 Heart failure, unspecified: Secondary | ICD-10-CM

## 2022-02-05 HISTORY — DX: Pneumonia, unspecified organism: J18.9

## 2022-02-05 MED ORDER — ALBUTEROL SULFATE HFA 108 (90 BASE) MCG/ACT IN AERS: 2.0000 | INHALATION_SPRAY | Freq: Four times a day (QID) | RESPIRATORY_TRACT | 3 refills | Status: DC | PRN

## 2022-02-05 MED ORDER — HYDROCOD POLI-CHLORPHE POLI ER 10-8 MG/5ML PO SUER: 5.0000 mL | Freq: Every evening | ORAL | 0 refills | Status: DC | PRN

## 2022-02-05 NOTE — Assessment & Plan Note (Signed)
Seen in UC and treated with amoxicillin, azithromycin and given injection in clinic. Will send cough medicine with codeine. Patient got the okay from pain management. Advised her absoluetly do not take it with her pain medication. Discussed adverse effects if medications are taken together.

## 2022-02-05 NOTE — Patient Instructions (Signed)
Please call to schedule your mammogram: [facility] [place]: [address], [place], [place] [address] Phone: [phone]

## 2022-02-05 NOTE — Assessment & Plan Note (Signed)
Chronic.  Controlled.  Continue with current medication regimen of Symbicort.  Refilled Albuterol today.  Labs ordered today.  Return to clinic in 3 months for reevaluation.  Can consider spirometry in the future if symptoms worsen.  Call sooner if concerns arise.

## 2022-02-05 NOTE — Assessment & Plan Note (Signed)
Labs ordered at visit today.  Will make recommendations based on lab results.   

## 2022-02-05 NOTE — Telephone Encounter (Signed)
Copied from [place] [id number]. Topic: General - Other >> Feb 05, 2022  4:01 PM Leitha Schuller wrote: Caller needing verbal authorization to fill chlorpheniramine-HYDROcodone (TUSSIONEX) 10-8 MG/5ML on top of the of Oxycodone HCl 20 MG TABS prescribed by pts pain management provider  Please advise

## 2022-02-05 NOTE — Assessment & Plan Note (Signed)
Had repeat hip replacement in October and doing well since.

## 2022-02-05 NOTE — Assessment & Plan Note (Signed)
Chronic.  Controlled.  Continue with current medication regimen.  May need to see Nephrology in the future.  Labs ordered today.  Return to clinic in 3 months for reevaluation.  Call sooner if concerns arise.

## 2022-02-05 NOTE — Assessment & Plan Note (Signed)
Chronic.  Controlled.  Continue with current medication regimen of Losartan.  Labs ordered today.  Return to clinic in 3 months for reevaluation.  Call sooner if concerns arise.

## 2022-02-05 NOTE — Telephone Encounter (Signed)
Routing to provider to advise.  

## 2022-02-05 NOTE — Assessment & Plan Note (Signed)
Chronic. On Atorvastatin. Labs ordered today. Will make recommendations based on lab results.  Follow up in 6 months. Call sooner if concerns arise.

## 2022-02-05 NOTE — Assessment & Plan Note (Signed)
Chronic.  Controlled.  Continue with current medication regimen.  Labs ordered today. Does not see Cardiology.  Return to clinic in 6 months for reevaluation.  Call sooner if concerns arise.

## 2022-02-06 ENCOUNTER — Telehealth: Payer: Self-pay | Admitting: Nurse Practitioner

## 2022-02-06 LAB — CBC WITH DIFFERENTIAL/PLATELET
Basophils Absolute: 0.1 10*3/uL (ref 0.0–0.2)
Basos: 2 %
EOS (ABSOLUTE): 0.3 10*3/uL (ref 0.0–0.4)
Eos: 5 %
Hematocrit: 42.6 % (ref 34.0–46.6)
Hemoglobin: 14.2 g/dL (ref 11.1–15.9)
Immature Grans (Abs): 0 10*3/uL (ref 0.0–0.1)
Immature Granulocytes: 0 %
Lymphocytes Absolute: 1.8 10*3/uL (ref 0.7–3.1)
Lymphs: 27 %
MCH: 30.8 pg (ref 26.6–33.0)
MCHC: 33.3 g/dL (ref 31.5–35.7)
MCV: 92 fL (ref 79–97)
Monocytes Absolute: 0.6 10*3/uL (ref 0.1–0.9)
Monocytes: 10 %
Neutrophils Absolute: 3.7 10*3/uL (ref 1.4–7.0)
Neutrophils: 56 %
Platelets: 332 10*3/uL (ref 150–450)
RBC: 4.61 x10E6/uL (ref 3.77–5.28)
RDW: 13 % (ref 11.7–15.4)
WBC: 6.5 10*3/uL (ref 3.4–10.8)

## 2022-02-06 LAB — VITAMIN D 25 HYDROXY (VIT D DEFICIENCY, FRACTURES): Vit D, 25-Hydroxy: 28.2 ng/mL — ABNORMAL LOW (ref 30.0–100.0)

## 2022-02-06 LAB — COMPREHENSIVE METABOLIC PANEL
ALT: 15 IU/L (ref 0–32)
AST: 22 IU/L (ref 0–40)
Albumin/Globulin Ratio: 1.6 (ref 1.2–2.2)
Albumin: 3.9 g/dL (ref 3.9–4.9)
Alkaline Phosphatase: 95 IU/L (ref 44–121)
BUN/Creatinine Ratio: 6 — ABNORMAL LOW (ref 12–28)
BUN: 6 mg/dL — ABNORMAL LOW (ref 8–27)
Bilirubin Total: 0.3 mg/dL (ref 0.0–1.2)
CO2: 23 mmol/L (ref 20–29)
Calcium: 9.4 mg/dL (ref 8.7–10.3)
Chloride: 103 mmol/L (ref 96–106)
Creatinine, Ser: 1.05 mg/dL — ABNORMAL HIGH (ref 0.57–1.00)
Globulin, Total: 2.4 g/dL (ref 1.5–4.5)
Glucose: 89 mg/dL (ref 70–99)
Potassium: 4.4 mmol/L (ref 3.5–5.2)
Sodium: 140 mmol/L (ref 134–144)
Total Protein: 6.3 g/dL (ref 6.0–8.5)
eGFR: 58 mL/min/{1.73_m2} — ABNORMAL LOW (ref >59)

## 2022-02-06 NOTE — Telephone Encounter (Signed)
Copied from [place] [phone] [id number]. Topic: General - Other >> Feb 06, 2022  2:37 PM Everette C wrote: Reason for CRM: The patient has called to request prior authorization for their prescription for chlorpheniramine-HYDROcodone ([place]) 10-8 MG/5ML [[id number]]   Please contact the patient further if needed

## 2022-02-06 NOTE — Progress Notes (Signed)
Hi Amy Rocha. Your lab work looks good.  Your kidney function remains stable. Your vitamin D is slightly low.  You would benefit from taking Vitamin D 1000IU daily.  This is over the counter.  No other concerns at this time.  Follow up as discussed.

## 2022-02-06 NOTE — Telephone Encounter (Signed)
Patient received the okay from her pain management provider. We had a lengthy discussion regarding the adverse effects of taking them together.  She was advised not to take her pain medication when taking the cough medication.

## 2022-02-06 NOTE — Telephone Encounter (Signed)
Called and notified the pharmacy of Karen's message.

## 2022-03-19 DIAGNOSIS — M62830 Muscle spasm of back: Secondary | ICD-10-CM | POA: Diagnosis not present

## 2022-03-19 DIAGNOSIS — M5136 Other intervertebral disc degeneration, lumbar region: Secondary | ICD-10-CM | POA: Diagnosis not present

## 2022-03-19 DIAGNOSIS — M5459 Other low back pain: Secondary | ICD-10-CM | POA: Diagnosis not present

## 2022-03-29 ENCOUNTER — Ambulatory Visit: Payer: Self-pay | Admitting: *Deleted

## 2022-03-29 ENCOUNTER — Ambulatory Visit (INDEPENDENT_AMBULATORY_CARE_PROVIDER_SITE_OTHER): Payer: 59 | Admitting: Family Medicine

## 2022-03-29 ENCOUNTER — Encounter: Payer: Self-pay | Admitting: Family Medicine

## 2022-03-29 ENCOUNTER — Ambulatory Visit
Admission: RE | Admit: 2022-03-29 | Discharge: 2022-03-29 | Disposition: A | Discharge status: Home / Self Care | Payer: 59 | Source: Ambulatory Visit | Attending: Family Medicine | Admitting: Family Medicine

## 2022-03-29 ENCOUNTER — Ambulatory Visit
Admission: RE | Admit: 2022-03-29 | Discharge: 2022-03-29 | Disposition: A | Discharge status: Home / Self Care | Payer: 59 | Attending: Family Medicine | Admitting: Family Medicine

## 2022-03-29 VITALS — BP 143/82 | HR 88 | Ht 64.0 in | Wt 146.3 lb

## 2022-03-29 DIAGNOSIS — M545 Low back pain, unspecified: Secondary | ICD-10-CM | POA: Diagnosis not present

## 2022-03-29 DIAGNOSIS — S32030A Wedge compression fracture of third lumbar vertebra, initial encounter for closed fracture: Secondary | ICD-10-CM | POA: Diagnosis not present

## 2022-03-29 DIAGNOSIS — R0981 Nasal congestion: Secondary | ICD-10-CM

## 2022-03-29 MED ORDER — TRIAMCINOLONE ACETONIDE 40 MG/ML IJ SUSP
40.0000 mg | Freq: Once | INTRAMUSCULAR | Status: AC
  Administered 2022-03-29: 40 mg via INTRAMUSCULAR

## 2022-03-29 MED ORDER — PREDNISONE 10 MG PO TABS: ORAL_TABLET | ORAL | 0 refills | Status: DC

## 2022-03-29 NOTE — Telephone Encounter (Signed)
Pt called back and given results by Destiny, CMA

## 2022-03-29 NOTE — Telephone Encounter (Signed)
Ruby from [facility] called to report stat results from Lumbar spine x ray from today . MPRESSION: Moderate superior endplate compression fracture of the L3 vertebral body with approximately 50% vertebral body height loss. Fracture appears to be acute or subacute. Correlate with point tenderness and consider follow-up MRI, as indicated.   These results will be called to the ordering clinician or representative by the Radiologist Assistant, and communication documented in the PACS or [employer].    Please advise.

## 2022-03-29 NOTE — Addendum Note (Signed)
Addended by: Valerie Roys on: 03/29/2022 04:03 PM   Modules accepted: Orders

## 2022-03-29 NOTE — Progress Notes (Signed)
BP (!) 143/82 (BP Location: Left Arm, Cuff Size: Normal)   Pulse 88   Ht 5' 4"$  (1.626 m)   Wt 146 lb 4.8 oz (66.4 kg)   LMP 09/13/1975 (Approximate)   SpO2 98%   BMI 25.11 kg/m    Subjective:    Patient ID: Amy Rocha, female    DOB: November 26, 1955, 67 y.o.   MRN: [id number]  HPI: Amy Rocha is a 67 y.o. female  Chief Complaint  Patient presents with   Back Pain    Patient says she hurt her back by bending down and picking up something wrong. Patient says she did that about two weeks ago and says she thought it would get better but it just got worse. Patient has been taking Meloxicam and Zanaflex that were prescribed by the pain clinic.    BACK PAIN Duration: 2 weeks Mechanism of injury: bending over and heard a pop Location: bilateral and low back Onset: sudden Severity: severe Quality: aching and sharp Frequency: constant Radiation: R thigh Aggravating factors: nothing Alleviating factors: nothing Status: worse Treatments attempted:  meloxicam and tizanidine, oxycodone, rest, ice, heat, and APAP  Relief with NSAIDs?: no Nighttime pain:  yes Paresthesias / decreased sensation:  no Bowel / bladder incontinence:  no Fevers:  no Dysuria / urinary frequency:  no  UPPER RESPIRATORY TRACT INFECTION Duration: 3 days Worst symptom: bleeding from her nose Fever: no Cough: no Shortness of breath: no Wheezing: no Chest pain: no Chest tightness: no Chest congestion: no Nasal congestion: yes Runny nose: no Post nasal drip: yes Sneezing: no Sore throat: no Swollen glands: no Sinus pressure: yes Headache: yes Face pain: yes Toothache: no Ear pain: no  Ear pressure: no  Eyes red/itching:no Eye drainage/crusting: no  Vomiting: no Rash: no Fatigue: yes Sick contacts: no Strep contacts: no  Context: stable Recurrent sinusitis: no Relief with OTC cold/cough medications: no  Treatments attempted: none   Relevant past medical, surgical, family and  social history reviewed and updated as indicated. Interim medical history since our last visit reviewed. Allergies and medications reviewed and updated.  Review of Systems  Constitutional: Negative.   HENT:  Positive for congestion, nosebleeds, postnasal drip, rhinorrhea, sinus pressure and sinus pain. Negative for dental problem, drooling, ear discharge, ear pain, facial swelling, hearing loss, mouth sores, sneezing, sore throat, tinnitus, trouble swallowing and voice change.   Respiratory: Negative.    Cardiovascular: Negative.   Gastrointestinal: Negative.   Musculoskeletal:  Positive for back pain. Negative for arthralgias, gait problem, joint swelling, myalgias, neck pain and neck stiffness.  Skin: Negative.   Psychiatric/Behavioral: Negative.      Per HPI unless specifically indicated above     Objective:    BP (!) 143/82 (BP Location: Left Arm, Cuff Size: Normal)   Pulse 88   Ht 5' 4"$  (1.626 m)   Wt 146 lb 4.8 oz (66.4 kg)   LMP 09/13/1975 (Approximate)   SpO2 98%   BMI 25.11 kg/m   Wt Readings from Last 3 Encounters:  03/29/22 146 lb 4.8 oz (66.4 kg)  02/05/22 140 lb 9.6 oz (63.8 kg)  11/30/21 137 lb (62.1 kg)    Physical Exam Vitals and nursing note reviewed.  Constitutional:      General: She is not in acute distress.    Appearance: Normal appearance. She is normal weight. She is not ill-appearing, toxic-appearing or diaphoretic.  HENT:     Head: Normocephalic and atraumatic.     Right Ear: External  ear normal.     Left Ear: External ear normal.     Nose: Nose normal.     Mouth/Throat:     Mouth: Mucous membranes are moist.     Pharynx: Oropharynx is clear.  Eyes:     General: No scleral icterus.       Right eye: No discharge.        Left eye: No discharge.     Extraocular Movements: Extraocular movements intact.     Conjunctiva/sclera: Conjunctivae normal.     Pupils: Pupils are equal, round, and reactive to light.  Cardiovascular:     Rate and Rhythm:  Normal rate and regular rhythm.     Pulses: Normal pulses.     Heart sounds: Normal heart sounds. No murmur heard.    No friction rub. No gallop.  Pulmonary:     Effort: Pulmonary effort is normal. No respiratory distress.     Breath sounds: Normal breath sounds. No stridor. No wheezing, rhonchi or rales.  Chest:     Chest wall: No tenderness.  Musculoskeletal:        General: Tenderness (L4 midline) present. No signs of injury. Normal range of motion.     Cervical back: Normal range of motion and neck supple.  Skin:    General: Skin is warm and dry.     Capillary Refill: Capillary refill takes less than 2 seconds.     Coloration: Skin is not jaundiced or pale.     Findings: No bruising, erythema, lesion or rash.  Neurological:     General: No focal deficit present.     Mental Status: She is alert and oriented to person, place, and time. Mental status is at baseline.  Psychiatric:        Mood and Affect: Mood normal.        Behavior: Behavior normal.        Thought Content: Thought content normal.        Judgment: Judgment normal.     Results for orders placed or performed in visit on 02/05/22  Comp Met (CMET)  Result Value Ref Range   Glucose 89 70 - 99 mg/dL   BUN 6 (L) 8 - 27 mg/dL   Creatinine, Ser 1.05 (H) 0.57 - 1.00 mg/dL   eGFR 58 (L) >59 mL/min/1.73   BUN/Creatinine Ratio 6 (L) 12 - 28   Sodium 140 134 - 144 mmol/L   Potassium 4.4 3.5 - 5.2 mmol/L   Chloride 103 96 - 106 mmol/L   CO2 23 20 - 29 mmol/L   Calcium 9.4 8.7 - 10.3 mg/dL   Total Protein 6.3 6.0 - 8.5 g/dL   Albumin 3.9 3.9 - 4.9 g/dL   Globulin, Total 2.4 1.5 - 4.5 g/dL   Albumin/Globulin Ratio 1.6 1.2 - 2.2   Bilirubin Total 0.3 0.0 - 1.2 mg/dL   Alkaline Phosphatase 95 44 - 121 IU/L   AST 22 0 - 40 IU/L   ALT 15 0 - 32 IU/L  CBC w/Diff  Result Value Ref Range   WBC 6.5 3.4 - 10.8 x10E3/uL   RBC 4.61 3.77 - 5.28 x10E6/uL   Hemoglobin 14.2 11.1 - 15.9 g/dL   Hematocrit 42.6 34.0 - 46.6 %    MCV 92 79 - 97 fL   MCH 30.8 26.6 - 33.0 pg   MCHC 33.3 31.5 - 35.7 g/dL   RDW 13.0 11.7 - 15.4 %   Platelets 332 150 - 450 x10E3/uL   Neutrophils 56  Not Estab. %   Lymphs 27 Not Estab. %   Monocytes 10 Not Estab. %   Eos 5 Not Estab. %   Basos 2 Not Estab. %   Neutrophils Absolute 3.7 1.4 - 7.0 x10E3/uL   Lymphocytes Absolute 1.8 0.7 - 3.1 x10E3/uL   Monocytes Absolute 0.6 0.1 - 0.9 x10E3/uL   EOS (ABSOLUTE) 0.3 0.0 - 0.4 x10E3/uL   Basophils Absolute 0.1 0.0 - 0.2 x10E3/uL   Immature Granulocytes 0 Not Estab. %   Immature Grans (Abs) 0.0 0.0 - 0.1 x10E3/uL  Vitamin D (25 hydroxy)  Result Value Ref Range   Vit D, 25-Hydroxy 28.2 (L) 30.0 - 100.0 ng/mL      Assessment & Plan:   Problem List Items Addressed This Visit   None Visit Diagnoses     Acute bilateral low back pain without sciatica    -  Primary   Will treat with steroids and stretches. Obtain x-ray to r/o compression fracture. Await results.   Relevant Medications   meloxicam (MOBIC) 15 MG tablet   tiZANidine (ZANAFLEX) 2 MG tablet   predniSONE (DELTASONE) 10 MG tablet   triamcinolone acetonide (KENALOG-40) injection 40 mg (Start on 03/29/2022  2:30 PM)   Other Relevant Orders   DG Lumbar Spine Complete   Nasal congestion       Will treat with prednisone. Call with any concerns. Continue to monitor.        Follow up plan: Return 2-3 weeks with [name].

## 2022-03-29 NOTE — Telephone Encounter (Signed)
Left message with patient contact information for patient with [facility] to get scheduled for a DEXA scan. Advised [place] to reach back out to our office or patient to get scheduled for appointment.   OK for [facility] to give note if returned call.

## 2022-04-01 ENCOUNTER — Telehealth: Payer: Self-pay

## 2022-04-01 NOTE — Telephone Encounter (Signed)
Patient is scheduled for 05/15/22 at 1:40 PM.

## 2022-04-01 NOTE — Progress Notes (Signed)
Referring Physician:  Dorcas Carrow, DO [address] [place],  [place] [address]  Primary Physician:  Larae Grooms, NP  History of Present Illness: 04/03/2022 [name] has a history of heart failure, CAD, CHF, PVD, HTN, asthma, COPD, GERD, IBS, osteoporosis, CKD. History of AVN of both hips.   Seen by PCP on 03/29/22 with acute back pain that started after bending down to pick something up 2 weeks prior. Xrays showed an L3 compression fracture and she was referred here.   She has constant LBP with constant right anterior thigh pain to her knee. No left leg pain. She has numbness and tingling in boht legs. She has weakness in both legs. Pain is worse with standing and walking.   PCP has ordered a bone density study.   History of right THA. History of left THA with 8 revisions. She sees outside pain management Amy Rocha) and is on chronic oxycodone 20mg  five per day.   Bowel/Bladder Dysfunction: none  Conservative measures:  Physical therapy: none  Multimodal medical therapy including regular antiinflammatories: mobic, zanaflex, oxycodone, prednisone  Injections: No epidural steroid injections  Past Surgery: no spinal surgery  Amy Rocha has no symptoms of cervical myelopathy.  The symptoms are causing a significant impact on the patient's life.   Review of Systems:  A 10 point review of systems is negative, except for the pertinent positives and negatives detailed in the HPI.  Past Medical History: Past Medical History:  Diagnosis Date   Anxiety    Arthritis    Asthma    Avascular necrosis (HCC)    Blood transfusion without reported diagnosis    Calculus of kidney    CHF (congestive heart failure) (HCC)    Chronic pain syndrome    Complication of anesthesia    aspirated in recovery after  hip replacement 1998   COPD (chronic obstructive pulmonary disease) (HCC)    Depression    GERD (gastroesophageal reflux disease)    Heart failure (HCC)     History of kidney stones    Hyperlipidemia    Hypertension    IBS (irritable bowel syndrome)    Joint pain    Osteoporosis    Oxygen deficiency    Peripheral vascular disease (HCC)    PONV (postoperative nausea and vomiting)    Urge incontinence     Past Surgical History: Past Surgical History:  Procedure Laterality Date   avascular necrosis     CARDIAC CATHETERIZATION Left 12/01/2015   Procedure: Left Heart Cath and Coronary Angiography;  Surgeon: Dalia Heading, MD;  Location: [facility] INVASIVE CV LAB;  Service: Cardiovascular;  Laterality: Left;   COLONOSCOPY WITH PROPOFOL N/A 10/08/2019   Procedure: COLONOSCOPY WITH PROPOFOL;  Surgeon: Wyline Mood, MD;  Location: [facility] ENDOSCOPY;  Service: Gastroenterology;  Laterality: N/A;   ESOPHAGOGASTRODUODENOSCOPY (EGD) WITH PROPOFOL N/A 11/08/2019   Procedure: ESOPHAGOGASTRODUODENOSCOPY (EGD) WITH PROPOFOL;  Surgeon: Wyline Mood, MD;  Location: [facility] ENDOSCOPY;  Service: Gastroenterology;  Laterality: N/A;   eyelid lift     HARDWARE REMOVAL Left 04/27/2020   Procedure: Left hip, deep hardware removal;  Surgeon: Kennedy Bucker, MD;  Location: [facility] ORS;  Service: Orthopedics;  Laterality: Left;   HERNIA REPAIR     INTRAMEDULLARY (IM) NAIL INTERTROCHANTERIC Left 10/14/2017   Procedure: REPAIR OF LEFT GREATER TROCHANTER NONUNION;  Surgeon: Kennedy Bucker, MD;  Location: [facility] ORS;  Service: Orthopedics;  Laterality: Left;   JOINT REPLACEMENT     hip   NASAL SINUS SURGERY  TOTAL ABDOMINAL HYSTERECTOMY     TOTAL HIP REVISION Left 01/21/2017   Procedure: TOTAL HIP REVISION FEMORAL STEM WITH POLYETHYLENE EXCHANGE WITH CUPLINER;  Surgeon: Kennedy Bucker, MD;  Location: [facility] ORS;  Service: Orthopedics;  Laterality: Left;    Allergies: Allergies as of 04/03/2022 - Review Complete 04/03/2022  Allergen Reaction Noted   Fluticasone-salmeterol Swelling and Other (See Comments) 09/08/2014   Lactose intolerance (gi) Other (See Comments) 09/06/2015    Tetracyclines & related Other (See Comments) 09/08/2014    Medications: Outpatient Encounter Medications as of 04/03/2022  Medication Sig   acetaminophen (TYLENOL) 500 MG tablet Take 1,000 mg by mouth 2 (two) times daily as needed for moderate pain or headache.    albuterol (VENTOLIN HFA) 108 (90 Base) MCG/ACT inhaler Inhale 2 puffs into the lungs every 6 (six) hours as needed for wheezing or shortness of breath.   amoxicillin-clavulanate (AUGMENTIN) 875-125 MG tablet Take 1 tablet by mouth 2 (two) times daily.   atorvastatin (LIPITOR) 40 MG tablet Take 1 tablet (40 mg total) by mouth at bedtime.   budesonide-formoterol (SYMBICORT) 160-4.5 MCG/ACT inhaler Inhale 2 puffs into the lungs 2 (two) times daily as needed (shortness of breath).   buPROPion (WELLBUTRIN XL) 150 MG 24 hr tablet Take 1 tablet (150 mg total) by mouth daily.   buPROPion (WELLBUTRIN XL) 300 MG 24 hr tablet Take 1 tablet (300 mg total) by mouth every morning.   chlorpheniramine-HYDROcodone (TUSSIONEX) 10-8 MG/5ML Take 5 mLs by mouth at bedtime as needed for cough.   cromolyn (OPTICROM) 4 % ophthalmic solution Place 1 drop into both eyes 4 (four) times daily. (Patient taking differently: Place 1 drop into both eyes 4 (four) times daily as needed (allergies). As needed)   fluticasone (FLONASE) 50 MCG/ACT nasal spray Place 2 sprays into both nostrils daily. (Patient taking differently: Place 2 sprays into both nostrils daily as needed for allergies.)   losartan (COZAAR) 25 MG tablet Take 1 tablet (25 mg total) by mouth daily.   meloxicam (MOBIC) 15 MG tablet 1 tablet Orally Once a day for 30 day(s)   montelukast (SINGULAIR) 10 MG tablet TAKE 1 TABLET (10 MG TOTAL) BY MOUTH AT BEDTIME.   NARCAN 4 MG/0.1ML LIQD nasal spray kit Place 1 spray into the nose once.   omeprazole (PRILOSEC) 20 MG capsule Take 1 capsule (20 mg total) by mouth daily.   ondansetron (ZOFRAN-ODT) 4 MG disintegrating tablet Take 1 tablet (4 mg total) by mouth  every 8 (eight) hours as needed for nausea or vomiting.   Oxycodone HCl 20 MG TABS Take 1 tablet (20 mg total) by mouth every 4 (four) hours as needed (pain). Takes 5 daily.   predniSONE (DELTASONE) 10 MG tablet Take 6 tabs at once in the AM days 1 and 2, then 5 tabs days 3 and 4, decrease by 1 every other day until gone.   rOPINIRole (REQUIP) 0.5 MG tablet Take 1 tablet (0.5 mg total) by mouth at bedtime as needed. (Patient taking differently: Take 0.5 mg by mouth at bedtime as needed (RLS).)   tiZANidine (ZANAFLEX) 2 MG tablet 1 tablet as needed Orally Three times a day for 30 days   valACYclovir (VALTREX) 500 MG tablet Take 4 tablets (2,000 mg total) by mouth 2 (two) times daily as needed.   vortioxetine HBr (TRINTELLIX) 20 MG TABS tablet Take 1 tablet (20 mg total) by mouth daily.   [DISCONTINUED] Oxycodone HCl 20 MG TABS 1 tablet as needed Orally every 4 to 6 hrs  for 30 days   No facility-administered encounter medications on file as of 04/03/2022.    Social History: Social History   Tobacco Use   Smoking status: Former   Smokeless tobacco: Never  Building services engineer Use: Former   Substances: Flavoring  Substance Use Topics   Alcohol use: No    Alcohol/week: 0.0 standard drinks of alcohol   Drug use: No    Family Medical History: Family History  Problem Relation Age of Onset   Dementia Mother    Cancer Father        colon   Arthritis Brother    Hypertension Daughter    Aneurysm Brother     Physical Examination: Vitals:   04/03/22 1005 04/03/22 1037  BP: (!) 160/92 (!) 160/78    General: Patient is well developed, well nourished, calm, collected, and in no apparent distress. Attention to examination is appropriate.  Respiratory: Patient is breathing without any difficulty.   NEUROLOGICAL:     Awake, alert, oriented to person, place, and time.  Speech is clear and fluent. Fund of knowledge is appropriate.   Cranial Nerves: Pupils equal round and reactive to  light.  Facial tone is symmetric.    ROM of lumbar spine not tested.   She has tenderness at L3 area and has diffuse lower lumbar tenderness.   No abnormal lesions on exposed skin.   Strength: Side Biceps Triceps Deltoid Interossei Grip Wrist Ext. Wrist Flex.  R 5 5 5 5 5 5 5   L 5 5 5 5 5 5 5    Side Iliopsoas Quads Hamstring PF DF EHL  R 5 5 5 5 5 5   L 5 5 5 5 5 5    Reflexes are 2+ and symmetric at the biceps, triceps, brachioradialis, patella and achilles.   Hoffman's is absent.  Clonus is not present.   Bilateral upper and lower extremity sensation is intact to light touch.     She has a limp favoring right leg.   Medical Decision Making  Imaging: Lumbar xrays dated 03/29/22:  FINDINGS: Moderate superior endplate compression fracture of the L3 vertebral body with approximately 50% vertebral body height loss. No additional fractures are identified. Normal alignment. Disc heights are preserved. Aortic atherosclerosis.   IMPRESSION: Moderate superior endplate compression fracture of the L3 vertebral body with approximately 50% vertebral body height loss. Fracture appears to be acute or subacute. Correlate with point tenderness and consider follow-up MRI, as indicated.   These results will be called to the ordering clinician or representative by the Radiologist Assistant, and communication documented in the PACS or [employer].     Electronically Signed   By: Duanne Guess D.O.   On: 03/29/2022 15:15  I have personally reviewed the images and agree with the above interpretation.  Assessment and Plan: Ms. Abernathy is a pleasant 67 y.o. female with L3 compression fracture s/p injury 2-3 weeks ago (end of January) when she bent down. She has constant LBP with constant right anterior thigh pain to her knee. No left leg pain.   History of right THA. History of left THA with 8 revisions. She sees outside pain management Amy Rocha) and is on chronic oxycodone  20mg  five per day.  She has known L3 compression fracture.   Treatment options discussed with patient and following plan made:   - MRI of lumbar spine to further evaluate L3 compression fracture and right lumbar radiculopathy. - Continue current medications including chronic oxycodone from pain management.  She will contact them to let them know about new compression fracture and that her pain is currently not well controlled.  - LSO brace ordered at [facility]. Do not wear in bed. Can remove when sitting and watching TV.  - No bending, twisting, or lifting.  - Will schedule follow up for phone visit to review MRI once I have the results back.   BP was elevated, it was 160/78 at end of visit. She has not taken her BP meds yet. No symptoms of chest pain, shortness of breath, blurry vision, or headaches.   She will go home and take BP meds. She is to recheck BP at home and call PCP if not improved. If she develops CP, SOB, blurry vision, or headaches, then she will go to ED.    I spent a total of 35 minutes in face-to-face and non-face-to-face activities related to this patient's care today including review of outside records, review of imaging, review of symptoms, physical exam, discussion of differential diagnosis, discussion of treatment options, and documentation.   Thank you for involving me in the care of this patient.   Drake Leach PA-C Dept. of Neurosurgery

## 2022-04-01 NOTE — Telephone Encounter (Signed)
-----   Message from Valerie Roys, [place] sent at 03/29/2022  4:02 PM EST ----- Please schedule for DEXA

## 2022-04-03 ENCOUNTER — Encounter: Payer: Self-pay | Admitting: Orthopedic Surgery

## 2022-04-03 ENCOUNTER — Ambulatory Visit (INDEPENDENT_AMBULATORY_CARE_PROVIDER_SITE_OTHER): Payer: 59 | Admitting: Orthopedic Surgery

## 2022-04-03 VITALS — BP 160/78 | Ht 64.0 in | Wt 141.2 lb

## 2022-04-03 DIAGNOSIS — S32030A Wedge compression fracture of third lumbar vertebra, initial encounter for closed fracture: Secondary | ICD-10-CM

## 2022-04-03 DIAGNOSIS — M5416 Radiculopathy, lumbar region: Secondary | ICD-10-CM | POA: Diagnosis not present

## 2022-04-03 NOTE — Progress Notes (Signed)
Order has been faxed to [facility].

## 2022-04-03 NOTE — Patient Instructions (Signed)
It was so nice to see you today. Thank you so much for coming in.    You have a broken bone in your back at L3 (compression fracture). This should heal, but can take 3 months.   I want to get an MRI of your lower back to look into things further. We will get this approved through your insurance and [facility] will call you to schedule the appointment.   Call your pain management provider to let them know that you have a new compression fracture and are having more pain. I am happy to talk to them if needed.   I sent a prescription for a back brace to the [facility]. They should call you or you can call them at [phone].   You do not need to wear brace to bed. Can remove if sitting and watching TV. Wear when up and around. No bending, twisting, or lifting.   Will call you to set up phone visit to review the MRI once I have the results back.   Your blood pressure was elevated today, it was 160/78. I want you to recheck it at home and follow up with your PCP if it remains high. If you have any chest pain, shortness of breath, blurry vision, or headaches then you need to go to ED.   Please do not hesitate to call if you have any questions or concerns. You can also message me in [place].   Geronimo Boot PA-C [phone]

## 2022-04-09 ENCOUNTER — Telehealth: Payer: Self-pay | Admitting: Nurse Practitioner

## 2022-04-09 NOTE — Telephone Encounter (Signed)
Contacted Amy Rocha to schedule their annual wellness visit. Appointment made for 04/29/2022.  [facility] Direct Dial: [phone]

## 2022-04-10 ENCOUNTER — Ambulatory Visit
Admission: RE | Admit: 2022-04-10 | Discharge: 2022-04-10 | Disposition: A | Discharge status: Home / Self Care | Payer: 59 | Source: Ambulatory Visit | Attending: Orthopedic Surgery | Admitting: Orthopedic Surgery

## 2022-04-10 DIAGNOSIS — S32030A Wedge compression fracture of third lumbar vertebra, initial encounter for closed fracture: Secondary | ICD-10-CM | POA: Insufficient documentation

## 2022-04-10 DIAGNOSIS — M5416 Radiculopathy, lumbar region: Secondary | ICD-10-CM | POA: Diagnosis not present

## 2022-04-10 DIAGNOSIS — M545 Low back pain, unspecified: Secondary | ICD-10-CM | POA: Diagnosis not present

## 2022-04-12 ENCOUNTER — Telehealth: Payer: Self-pay

## 2022-04-12 NOTE — Telephone Encounter (Signed)
Called and notified patient that we are waiting on results to be read by the radiologist.

## 2022-04-12 NOTE — Telephone Encounter (Signed)
Copied from [place] [id number]. Topic: General - Inquiry >> Apr 11, 2022  4:33 PM Rosanne Ashing P wrote: Reason for CRM: pt called regarding the imaging she had done yesterday.   She is looking for a result.  [phone]

## 2022-04-15 ENCOUNTER — Encounter: Payer: 59 | Admitting: Nurse Practitioner

## 2022-04-15 NOTE — Progress Notes (Deleted)
Telephone Visit- Progress Note: Referring Physician:  No referring provider defined for this encounter.  Primary Physician:  Amy Billings, NP  This visit was performed via telephone.  Patient location: home Provider location: office  I spent a total of *** minutes non-face-to-face activities for this visit on the date of this encounter including review of current clinical condition and response to treatment.    Patient has given verbal consent to this telephone visits and we reviewed the limitations of a telephone visit. Patient wishes to proceed.    Chief Complaint:  review lumbar MRI results  History of Present Illness: Amy Rocha is a 67 y.o. female has a history of heart failure, CAD, CHF, PVD, HTN, asthma, COPD, GERD, IBS, osteoporosis, CKD. History of AVN of both hips.     Last seen by me on 04/03/22 for L3 compression fracture s/p injury 2-3 weeks ago (end of January) when she bent down.    History of right THA. History of left THA with 8 revisions. She sees outside pain management Toniann Fail) and is on chronic oxycodone '20mg'$  five per day.     She has constant LBP with constant right anterior thigh pain to her knee. No left leg pain.     Can you get a CT? It looks like she doesn't have a CT from the beginning. ***  Some of these fractures are a little different subtype where the fracture goes through the bone in the coronal plane. Those do not heal as well because disc material can get into the fracture cleft. ***    Conservative measures:  Physical therapy: none  Multimodal medical therapy including regular antiinflammatories: mobic, zanaflex, oxycodone, prednisone  Injections: No epidural steroid injections   Past Surgery: no spinal surgery   Amy Rocha has no symptoms of cervical myelopathy.   The symptoms are causing a significant impact on the patient's life.   Exam: No exam done as this was a telephone encounter.      Imaging: MRI of lumbar spine dated 04/10/22:  FINDINGS: Segmentation:  Standard.   Alignment:  Physiologic.   Vertebrae: L3 body fracture with marrow edema. The central body is compressed by 70%. Posterosuperior corner is retropulsed with mild canal narrowing. No evidence of underlying bone lesion   Conus medullaris and cauda equina: Conus extends to the L1 level. Conus and cauda equina appear normal.   Paraspinal and other soft tissues: Negative for perispinal mass or inflammation   Disc levels:   T12- L1: Unremarkable.   L1-L2: Posterior annular fissure.  No neural compression   L2-L3: Mild disc bulging and facet spurring. Mild spinal stenosis from above described retropulsion and bulging disc   L3-L4: Mild disc bulging and facet spurring.   L4-L5: Unremarkable.   L5-S1:Unremarkable.   IMPRESSION: Acute or subacute L3 body fracture with 70% height loss. 4 mm of retropulsion causes mild spinal canal narrowing at L2-3.     Electronically Signed   By: Jorje Guild M.D.   On: 04/12/2022 19:06  I have personally reviewed the images and agree with the above interpretation.  Above MRI reviewed with Dr. Izora Ribas as well.   Assessment and Plan: [name] is a pleasant 67 y.o. female with ***  Treatment options discussed with patient and following plan made:   - Order for physical therapy for *** spine ***. Patient to call to schedule appointment. *** - Continue current medications including ***. Reviewed dosing and side effects.  - Prescription for ***.  Reviewed dosing and side effects. Take with food.  - Prescription for *** to take prn muscle spasms. Reviewed dosing and side effects. Discussed this can cause drowsiness.  - MRI of *** to further evaluate *** radiculopathy. No improvement time or medications (***).  - Referral to PMR at [facility] to discuss possible *** injections.  - Will schedule phone visit to review MRI results once I get them back.   Geronimo Boot PA-C Neurosurgery

## 2022-04-16 ENCOUNTER — Telehealth: Payer: 59 | Admitting: Orthopedic Surgery

## 2022-04-16 DIAGNOSIS — M5136 Other intervertebral disc degeneration, lumbar region: Secondary | ICD-10-CM | POA: Diagnosis not present

## 2022-04-16 DIAGNOSIS — G894 Chronic pain syndrome: Secondary | ICD-10-CM | POA: Diagnosis not present

## 2022-04-16 DIAGNOSIS — G4700 Insomnia, unspecified: Secondary | ICD-10-CM | POA: Diagnosis not present

## 2022-04-16 DIAGNOSIS — M169 Osteoarthritis of hip, unspecified: Secondary | ICD-10-CM | POA: Diagnosis not present

## 2022-04-16 DIAGNOSIS — G8929 Other chronic pain: Secondary | ICD-10-CM | POA: Diagnosis not present

## 2022-04-16 NOTE — Telephone Encounter (Signed)
Copied from [place] [id number]. Topic: General - Other >> Apr 15, 2022  3:48 PM Sabas Sous wrote: Reason for CRM: Pt asked for the office to fax her Xrays to her pain clinic. Please advise   [facility]   Fax: [phone]

## 2022-04-17 ENCOUNTER — Encounter: Payer: Self-pay | Admitting: Orthopedic Surgery

## 2022-04-17 ENCOUNTER — Ambulatory Visit (INDEPENDENT_AMBULATORY_CARE_PROVIDER_SITE_OTHER): Payer: 59 | Admitting: Orthopedic Surgery

## 2022-04-17 DIAGNOSIS — S32030A Wedge compression fracture of third lumbar vertebra, initial encounter for closed fracture: Secondary | ICD-10-CM | POA: Diagnosis not present

## 2022-04-17 DIAGNOSIS — S32030D Wedge compression fracture of third lumbar vertebra, subsequent encounter for fracture with routine healing: Secondary | ICD-10-CM

## 2022-04-17 NOTE — Progress Notes (Signed)
Telephone Visit- Progress Note: Referring Physician:  Jon Billings, NP [address] [place],  [place] [address]  Primary Physician:  Jon Billings, NP  This visit was performed via telephone.  Patient location: home Provider location: office  I spent a total of 15 minutes non-face-to-face activities for this visit on the date of this encounter including review of current clinical condition and response to treatment.    Patient has given verbal consent to this telephone visits and we reviewed the limitations of a telephone visit. Patient wishes to proceed.    Chief Complaint:  review lumbar MRI results  History of Present Illness: Amy Rocha is a 67 y.o. female has a history of heart failure, CAD, CHF, PVD, HTN, asthma, COPD, GERD, IBS, osteoporosis, CKD. History of AVN of both hips.     Last seen by me on 04/03/22 for L3 compression fracture s/p injury 2-3 weeks ago (end of January) when she bent down.    History of right THA. History of left THA with 8 revisions. She sees outside pain management Toniann Fail) and is on chronic oxycodone '20mg'$  five per day.  She continues with constant LBP with constant right anterior thigh pain to her knee. No left leg pain. She has occasional numbness and tingling in her right thigh. No weakness.    She has not gotten her brace from [facility] yet. Has an appointment with them on 3/5//24.   Conservative measures:  Physical therapy: none  Multimodal medical therapy including regular antiinflammatories: mobic, zanaflex, oxycodone, prednisone  Injections: No epidural steroid injections   Past Surgery: no spinal surgery   Amy Rocha has no symptoms of cervical myelopathy.   The symptoms are causing a significant impact on the patient's life.   Exam: No exam done as this was a telephone encounter.     Imaging: MRI of lumbar spine dated 04/10/22:  FINDINGS: Segmentation:  Standard.   Alignment:  Physiologic.    Vertebrae: L3 body fracture with marrow edema. The central body is compressed by 70%. Posterosuperior corner is retropulsed with mild canal narrowing. No evidence of underlying bone lesion   Conus medullaris and cauda equina: Conus extends to the L1 level. Conus and cauda equina appear normal.   Paraspinal and other soft tissues: Negative for perispinal mass or inflammation   Disc levels:   T12- L1: Unremarkable.   L1-L2: Posterior annular fissure.  No neural compression   L2-L3: Mild disc bulging and facet spurring. Mild spinal stenosis from above described retropulsion and bulging disc   L3-L4: Mild disc bulging and facet spurring.   L4-L5: Unremarkable.   L5-S1:Unremarkable.   IMPRESSION: Acute or subacute L3 body fracture with 70% height loss. 4 mm of retropulsion causes mild spinal canal narrowing at L2-3.     Electronically Signed   By: Jorje Guild M.D.   On: 04/12/2022 19:06  I have personally reviewed the images and agree with the above interpretation.  Above MRI reviewed with Dr. Izora Ribas as well.   Assessment and Plan: Amy Rocha is a pleasant 67 y.o. female with L3 compression fracture s/p injury at the end of January when she bent down.    History of right THA. History of left THA with 8 revisions. She sees outside pain management Toniann Fail) and is on chronic oxycodone '20mg'$  five per day.  She continues with constant LBP with constant right anterior thigh pain to her knee. No left leg pain. She has occasional numbness and tingling in her  right thigh. No weakness.    MRI shows acute or subacute L3 body fracture with 70% height loss. 4 mm of retropulsion causes mild spinal canal narrowing at L2-3.  Treatment options discussed with patient and following plan made:   - Still recommend LSO brace from [facility]. Will call them to see if they can move up her 04/23/22 appointment and let her know.  - No bending, twisting, or lifting.  - CT scan of  lumbar spine to get better look at L3 compression fracture per Dr. Izora Ribas.  - She will discuss further pain medications with her pain management provider. Pain is not currently well controlled.  - Will setup phone call to review CT results once I have them back.   Geronimo Boot PA-C Neurosurgery

## 2022-04-19 ENCOUNTER — Other Ambulatory Visit: Payer: Self-pay | Admitting: Nurse Practitioner

## 2022-04-22 NOTE — Telephone Encounter (Signed)
Requested Prescriptions  Pending Prescriptions Disp Refills   omeprazole (PRILOSEC) 20 MG capsule [Pharmacy Med Name: OMEPRAZOLE 20 MG CAP 20 Capsule] 90 capsule 1    Sig: TAKE 1 CAPSULE BY MOUTH DAILY.     Gastroenterology: Proton Pump Inhibitors Passed - 04/19/2022  6:31 PM      Passed - Valid encounter within last 12 months    Recent Outpatient Visits           3 weeks ago Acute bilateral low back pain without sciatica   [place], [place], DO   2 months ago Chronic congestive heart failure, unspecified heart failure type [facility])   [place] Jon Billings, NP   5 months ago Need for pneumococcal vaccine   [place] Jon Billings, NP   6 months ago Hypertensive heart failure [facility])   [employer] Jon Billings, NP   11 months ago Moderate episode of recurrent major depressive disorder [facility])   [place] Jon Billings, NP       Future Appointments             In 4 weeks Jon Billings, NP [place], [facility]             TRINTELLIX 20 MG TABS tablet [Pharmacy Med Name: TRINTELLIX 20 MG TABLET 20 Tablet] 90 tablet 0    Sig: TAKE 1 TABLET (20 MG TOTAL) BY MOUTH DAILY.     Psychiatry: Antidepressants - Serotonin Modulator Passed - 04/19/2022  6:31 PM      Passed - Completed PHQ-2 or PHQ-9 in the last 360 days      Passed - Valid encounter within last 6 months    Recent Outpatient Visits           3 weeks ago Acute bilateral low back pain without sciatica   [place], [place], DO   2 months ago Chronic congestive heart failure, unspecified heart failure type [facility])   [place], NP   5 months ago Need for pneumococcal vaccine   [place], NP   6 months ago Hypertensive heart  failure [facility])   [place] Jon Billings, NP   11 months ago Moderate episode of recurrent major depressive disorder [facility])   [place] Jon Billings, NP       Future Appointments             In 4 weeks Jon Billings, NP [place], [facility]             atorvastatin (LIPITOR) 40 MG tablet [Pharmacy Med Name: ATORVASTATIN 40 MG TAB 40 Tablet] 90 tablet 1    Sig: TAKE 1 TABLET (40 MG TOTAL) BY MOUTH AT BEDTIME.     Cardiovascular:  Antilipid - Statins Failed - 04/19/2022  6:31 PM      Failed - Lipid Panel in normal range within the last 12 months    Cholesterol, Total  Date Value Ref Range Status  10/10/2021 178 100 - 199 mg/dL Final   LDL Chol Calc (NIH)  Date Value Ref Range Status  10/10/2021 78 0 - 99 mg/dL Final   HDL  Date Value Ref Range Status  10/10/2021 86 >39 mg/dL Final   Triglycerides  Date Value Ref Range Status  10/10/2021 74 0 -  149 mg/dL Final         Passed - Patient is not pregnant      Passed - Valid encounter within last 12 months    Recent Outpatient Visits           3 weeks ago Acute bilateral low back pain without sciatica   [place], [place], DO   2 months ago Chronic congestive heart failure, unspecified heart failure type [facility])   [place], NP   5 months ago Need for pneumococcal vaccine   [place], NP   6 months ago Hypertensive heart failure [facility])   [place] Jon Billings, NP   11 months ago Moderate episode of recurrent major depressive disorder [facility])   [place], NP       Future Appointments             In 4 weeks Jon Billings, NP [place], [facility]             buPROPion (WELLBUTRIN XL) 150 MG 24 hr tablet  [Pharmacy Med Name: BUPROPION ER XL 150 MG TAB 150 Tablet] 90 tablet 0    Sig: TAKE 1 TABLET (150 MG TOTAL) BY MOUTH DAILY.     Psychiatry: Antidepressants - bupropion Failed - 04/19/2022  6:31 PM      Failed - Cr in normal range and within 360 days    Creatinine  Date Value Ref Range Status  09/14/2013 0.88 0.60 - 1.30 mg/dL Final   Creatinine, Ser  Date Value Ref Range Status  02/05/2022 1.05 (H) 0.57 - 1.00 mg/dL Final         Failed - Last BP in normal range    BP Readings from Last 1 Encounters:  04/03/22 (!) 160/78         Passed - AST in normal range and within 360 days    AST  Date Value Ref Range Status  02/05/2022 22 0 - 40 IU/L Final   SGOT(AST)  Date Value Ref Range Status  09/14/2013 36 15 - 37 Unit/L Final         Passed - ALT in normal range and within 360 days    ALT  Date Value Ref Range Status  02/05/2022 15 0 - 32 IU/L Final   SGPT (ALT)  Date Value Ref Range Status  09/14/2013 45 U/L Final    Comment:    14-63 NOTE: New Reference Range 09/07/13          Passed - Completed PHQ-2 or PHQ-9 in the last 360 days      Passed - Valid encounter within last 6 months    Recent Outpatient Visits           3 weeks ago Acute bilateral low back pain without sciatica   [place], [place], DO   2 months ago Chronic congestive heart failure, unspecified heart failure type [facility])   [place], NP   5 months ago Need for pneumococcal vaccine   [place], [place], NP   6 months ago Hypertensive heart failure [facility])   [place] Jon Billings, NP   11 months ago Moderate episode of recurrent major depressive disorder [facility])   [place] Jon Billings, NP  Future Appointments             In 4 weeks Jon Billings, NP [place]

## 2022-04-23 ENCOUNTER — Ambulatory Visit: Payer: 59 | Admitting: Nurse Practitioner

## 2022-04-26 ENCOUNTER — Ambulatory Visit: Payer: 59

## 2022-04-29 ENCOUNTER — Ambulatory Visit (INDEPENDENT_AMBULATORY_CARE_PROVIDER_SITE_OTHER): Payer: 59

## 2022-04-29 VITALS — Ht 64.0 in | Wt 141.0 lb

## 2022-04-29 DIAGNOSIS — Z0000 Encounter for general adult medical examination without abnormal findings: Secondary | ICD-10-CM

## 2022-04-29 NOTE — Patient Instructions (Signed)
Amy Rocha , Thank you for taking time to come for your Medicare Wellness Visit. I appreciate your ongoing commitment to your health goals. Please review the following plan we discussed and let me know if I can assist you in the future.   These are the goals we discussed:  Goals      DIET - EAT MORE FRUITS AND VEGETABLES     Increase physical activity     Increase water intake     Recommend drinking at least 5-6 glasses of water a day         This is a list of the screening recommended for you and due dates:  Health Maintenance  Topic Date Due   Mammogram  08/19/2020   COVID-19 Vaccine (4 - 2023-24 season) 10/19/2021   Medicare Annual Wellness Visit  04/29/2023   Colon Cancer Screening  10/07/2024   DTaP/Tdap/Td vaccine (3 - Tdap) 09/23/2028   Pneumonia Vaccine  Completed   Flu Shot  Completed   DEXA scan (bone density measurement)  Completed   Hepatitis C Screening: USPSTF Recommendation to screen - Ages 22-79 yo.  Completed   Zoster (Shingles) Vaccine  Completed   HPV Vaccine  Aged Out    Advanced directives: no  Conditions/risks identified: none  Next appointment: Follow up in one year for your annual wellness visit 05/05/23 @ 1:00 pm by phone   Preventive Care 65 Years and Older, Female Preventive care refers to lifestyle choices and visits with your health care provider that can promote health and wellness. What does preventive care include? A yearly physical exam. This is also called an annual well check. Dental exams once or twice a year. Routine eye exams. Ask your health care provider how often you should have your eyes checked. Personal lifestyle choices, including: Daily care of your teeth and gums. Regular physical activity. Eating a healthy diet. Avoiding tobacco and drug use. Limiting alcohol use. Practicing safe sex. Taking low-dose aspirin every day. Taking vitamin and mineral supplements as recommended by your health care provider. What happens  during an annual well check? The services and screenings done by your health care provider during your annual well check will depend on your age, overall health, lifestyle risk factors, and family history of disease. Counseling  Your health care provider may ask you questions about your: Alcohol use. Tobacco use. Drug use. Emotional well-being. Home and relationship well-being. Sexual activity. Eating habits. History of falls. Memory and ability to understand (cognition). Work and work Statistician. Reproductive health. Screening  You may have the following tests or measurements: Height, weight, and BMI. Blood pressure. Lipid and cholesterol levels. These may be checked every 5 years, or more frequently if you are over 46 years old. Skin check. Lung cancer screening. You may have this screening every year starting at age 48 if you have a 30-pack-year history of smoking and currently smoke or have quit within the past 15 years. Fecal occult blood test (FOBT) of the stool. You may have this test every year starting at age 2. Flexible sigmoidoscopy or colonoscopy. You may have a sigmoidoscopy every 5 years or a colonoscopy every 10 years starting at age 1. Hepatitis C blood test. Hepatitis B blood test. Sexually transmitted disease (STD) testing. Diabetes screening. This is done by checking your blood sugar (glucose) after you have not eaten for a while (fasting). You may have this done every 1-3 years. Bone density scan. This is done to screen for osteoporosis. You may have this  done starting at age 95. Mammogram. This may be done every 1-2 years. Talk to your health care provider about how often you should have regular mammograms. Talk with your health care provider about your test results, treatment options, and if necessary, the need for more tests. Vaccines  Your health care provider may recommend certain vaccines, such as: Influenza vaccine. This is recommended every  year. Tetanus, diphtheria, and acellular pertussis (Tdap, Td) vaccine. You may need a Td booster every 10 years. Zoster vaccine. You may need this after age 20. Pneumococcal 13-valent conjugate (PCV13) vaccine. One dose is recommended after age 13. Pneumococcal polysaccharide (PPSV23) vaccine. One dose is recommended after age 81. Talk to your health care provider about which screenings and vaccines you need and how often you need them. This information is not intended to replace advice given to you by your health care provider. Make sure you discuss any questions you have with your health care provider. Document Released: 03/03/2015 Document Revised: 10/25/2015 Document Reviewed: 12/06/2014 Elsevier Interactive Patient Education  2017 [place] Prevention in the Home Falls can cause injuries. They can happen to people of all ages. There are many things you can do to make your home safe and to help prevent falls. What can I do on the outside of my home? Regularly fix the edges of walkways and driveways and fix any cracks. Remove anything that might make you trip as you walk through a door, such as a raised step or threshold. Trim any bushes or trees on the path to your home. Use bright outdoor lighting. Clear any walking paths of anything that might make someone trip, such as rocks or tools. Regularly check to see if handrails are loose or broken. Make sure that both sides of any steps have handrails. Any raised decks and porches should have guardrails on the edges. Have any leaves, snow, or ice cleared regularly. Use sand or salt on walking paths during winter. Clean up any spills in your garage right away. This includes oil or grease spills. What can I do in the bathroom? Use night lights. Install grab bars by the toilet and in the tub and shower. Do not use towel bars as grab bars. Use non-skid mats or decals in the tub or shower. If you need to sit down in the shower, use a  plastic, non-slip stool. Keep the floor dry. Clean up any water that spills on the floor as soon as it happens. Remove soap buildup in the tub or shower regularly. Attach bath mats securely with double-sided non-slip rug tape. Do not have throw rugs and other things on the floor that can make you trip. What can I do in the bedroom? Use night lights. Make sure that you have a light by your bed that is easy to reach. Do not use any sheets or blankets that are too big for your bed. They should not hang down onto the floor. Have a firm chair that has side arms. You can use this for support while you get dressed. Do not have throw rugs and other things on the floor that can make you trip. What can I do in the kitchen? Clean up any spills right away. Avoid walking on wet floors. Keep items that you use a lot in easy-to-reach places. If you need to reach something above you, use a strong step stool that has a grab bar. Keep electrical cords out of the way. Do not use floor polish or wax  that makes floors slippery. If you must use wax, use non-skid floor wax. Do not have throw rugs and other things on the floor that can make you trip. What can I do with my stairs? Do not leave any items on the stairs. Make sure that there are handrails on both sides of the stairs and use them. Fix handrails that are broken or loose. Make sure that handrails are as long as the stairways. Check any carpeting to make sure that it is firmly attached to the stairs. Fix any carpet that is loose or worn. Avoid having throw rugs at the top or bottom of the stairs. If you do have throw rugs, attach them to the floor with carpet tape. Make sure that you have a light switch at the top of the stairs and the bottom of the stairs. If you do not have them, ask someone to add them for you. What else can I do to help prevent falls? Wear shoes that: Do not have high heels. Have rubber bottoms. Are comfortable and fit you  well. Are closed at the toe. Do not wear sandals. If you use a stepladder: Make sure that it is fully opened. Do not climb a closed stepladder. Make sure that both sides of the stepladder are locked into place. Ask someone to hold it for you, if possible. Clearly mark and make sure that you can see: Any grab bars or handrails. First and last steps. Where the edge of each step is. Use tools that help you move around (mobility aids) if they are needed. These include: Canes. Walkers. Scooters. Crutches. Turn on the lights when you go into a dark area. Replace any light bulbs as soon as they burn out. Set up your furniture so you have a clear path. Avoid moving your furniture around. If any of your floors are uneven, fix them. If there are any pets around you, be aware of where they are. Review your medicines with your doctor. Some medicines can make you feel dizzy. This can increase your chance of falling. Ask your doctor what other things that you can do to help prevent falls. This information is not intended to replace advice given to you by your health care provider. Make sure you discuss any questions you have with your health care provider. Document Released: 12/01/2008 Document Revised: 07/13/2015 Document Reviewed: 03/11/2014 Elsevier Interactive Patient Education  2017 [employer].

## 2022-04-29 NOTE — Progress Notes (Signed)
I connected with  Amy Rocha on 04/29/22 by a audio enabled telemedicine application and verified that I am speaking with the correct person using two identifiers.  Patient Location: Home  Provider Location: Office/Clinic  I discussed the limitations of evaluation and management by telemedicine. The patient expressed understanding and agreed to proceed.  Subjective:   Amy Rocha is a 67 y.o. female who presents for Medicare Annual (Subsequent) preventive examination.  Review of Systems     Cardiac Risk Factors include: advanced age (>34mn, >>72women);hypertension     Objective:    Today's Vitals   04/29/22 1305  PainSc: 8    There is no height or weight on file to calculate BMI.     04/29/2022    1:12 PM 11/30/2021   10:13 AM 10/14/2021    6:13 PM 01/16/2021    9:16 AM 04/27/2020   10:48 AM 04/19/2020    9:08 AM 11/08/2019    8:54 AM  Advanced Directives  Does Patient Have a Medical Advance Directive? No No No No No No No  Would patient like information on creating a medical advance directive? No - Patient declined No - Patient declined No - Patient declined No - Patient declined No - Patient declined No - Patient declined     Current Medications (verified) Outpatient Encounter Medications as of 04/29/2022  Medication Sig   acetaminophen (TYLENOL) 500 MG tablet Take 1,000 mg by mouth 2 (two) times daily as needed for moderate pain or headache.    albuterol (VENTOLIN HFA) 108 (90 Base) MCG/ACT inhaler Inhale 2 puffs into the lungs every 6 (six) hours as needed for wheezing or shortness of breath.   atorvastatin (LIPITOR) 40 MG tablet TAKE 1 TABLET (40 MG TOTAL) BY MOUTH AT BEDTIME.   budesonide-formoterol (SYMBICORT) 160-4.5 MCG/ACT inhaler Inhale 2 puffs into the lungs 2 (two) times daily as needed (shortness of breath).   buPROPion (WELLBUTRIN XL) 150 MG 24 hr tablet TAKE 1 TABLET (150 MG TOTAL) BY MOUTH DAILY.   buPROPion (WELLBUTRIN XL) 300 MG 24 hr tablet  Take 1 tablet (300 mg total) by mouth every morning.   cromolyn (OPTICROM) 4 % ophthalmic solution Place 1 drop into both eyes 4 (four) times daily. (Patient taking differently: Place 1 drop into both eyes 4 (four) times daily as needed (allergies). As needed)   losartan (COZAAR) 25 MG tablet Take 1 tablet (25 mg total) by mouth daily.   montelukast (SINGULAIR) 10 MG tablet TAKE 1 TABLET (10 MG TOTAL) BY MOUTH AT BEDTIME.   NARCAN 4 MG/0.1ML LIQD nasal spray kit Place 1 spray into the nose once.   omeprazole (PRILOSEC) 20 MG capsule TAKE 1 CAPSULE BY MOUTH DAILY.   ondansetron (ZOFRAN-ODT) 4 MG disintegrating tablet Take 1 tablet (4 mg total) by mouth every 8 (eight) hours as needed for nausea or vomiting.   Oxycodone HCl 20 MG TABS Take 1 tablet (20 mg total) by mouth every 4 (four) hours as needed (pain). Takes 5 daily.   tiZANidine (ZANAFLEX) 2 MG tablet 1 tablet as needed Orally Three times a day for 30 days   valACYclovir (VALTREX) 500 MG tablet Take 4 tablets (2,000 mg total) by mouth 2 (two) times daily as needed.   vortioxetine HBr (TRINTELLIX) 20 MG TABS tablet TAKE 1 TABLET (20 MG TOTAL) BY MOUTH DAILY.   amoxicillin-clavulanate (AUGMENTIN) 875-125 MG tablet Take 1 tablet by mouth 2 (two) times daily. (Patient not taking: Reported on 04/29/2022)   chlorpheniramine-HYDROcodone (TBelle Fontaine  10-8 MG/5ML Take 5 mLs by mouth at bedtime as needed for cough. (Patient not taking: Reported on 04/29/2022)   fluticasone (FLONASE) 50 MCG/ACT nasal spray Place 2 sprays into both nostrils daily. (Patient taking differently: Place 2 sprays into both nostrils daily as needed for allergies.)   predniSONE (DELTASONE) 10 MG tablet Take 6 tabs at once in the AM days 1 and 2, then 5 tabs days 3 and 4, decrease by 1 every other day until gone. (Patient not taking: Reported on 04/29/2022)   rOPINIRole (REQUIP) 0.5 MG tablet Take 1 tablet (0.5 mg total) by mouth at bedtime as needed. (Patient not taking: Reported on  04/29/2022)   No facility-administered encounter medications on file as of 04/29/2022.    Allergies (verified) Fluticasone-salmeterol, Lactose intolerance (gi), and Tetracyclines & related   History: Past Medical History:  Diagnosis Date   Anxiety    Arthritis    Asthma    Avascular necrosis ([place])    Blood transfusion without reported diagnosis    Calculus of kidney    CHF (congestive heart failure) (HCC)    Chronic pain syndrome    Complication of anesthesia    aspirated in recovery after  hip replacement 1998   COPD (chronic obstructive pulmonary disease) (HCC)    Depression    GERD (gastroesophageal reflux disease)    Heart failure (HCC)    History of kidney stones    Hyperlipidemia    Hypertension    IBS (irritable bowel syndrome)    Joint pain    Osteoporosis    Oxygen deficiency    Peripheral vascular disease (HCC)    PONV (postoperative nausea and vomiting)    Urge incontinence    Past Surgical History:  Procedure Laterality Date   avascular necrosis     CARDIAC CATHETERIZATION Left 12/01/2015   Procedure: Left Heart Cath and Coronary Angiography;  Surgeon: Teodoro Spray, MD;  Location: [place];  Service: Cardiovascular;  Laterality: Left;   COLONOSCOPY WITH PROPOFOL N/A 10/08/2019   Procedure: COLONOSCOPY WITH PROPOFOL;  Surgeon: [name], MD;  Location: [facility];  Service: Gastroenterology;  Laterality: N/A;   ESOPHAGOGASTRODUODENOSCOPY (EGD) WITH PROPOFOL N/A 11/08/2019   Procedure: ESOPHAGOGASTRODUODENOSCOPY (EGD) WITH PROPOFOL;  Surgeon: [name], MD;  Location[facility];  Service: Gastroenterology;  Laterality: N/A;   eyelid lift     HARDWARE REMOVAL Left 04/27/2020   Procedure: Left hip, deep hardware removal;  Surgeon: Hessie Knows, MD;  Location: [facility] OR[facility];  Service: Orthopedics;  Laterality: Left;   HERNIA REPAIR     INTRAMEDULLARY (IM) NAIL INTERTROCHANTERIC Left 10/14/2017   Procedure: REPAIR OF LEFT GREATER TROCHANTER  NONUNION;  Surgeon: Hessie Knows, MD;  Location: [facility];  Service: Orthopedics;  Laterality: Left;   JOINT REPLACEMENT     hip   NASAL SINUS SURGERY     TOTAL ABDOMINAL HYSTERECTOMY     TOTAL HIP REVISION Left 01/21/2017   Procedure: TOTAL HIP REVISION FEMORAL STEM WITH POLYETHYLENE EXCHANGE WITH CUPLINER;  Surgeon: Hessie Knows, MD;  Location: [facility];  Service: Orthopedics;  Laterality: Left;   Family History  Problem Relation Age of Onset   Dementia Mother    Cancer Father        colon   Arthritis Brother    Hypertension Daughter    Aneurysm Brother    Social History   Socioeconomic History   Marital status: Unknown    Spouse name: Not on file   Number of children: Not on file  Years of education: Not on file   Highest education level: 12th grade  Occupational History   Occupation: disabled   Tobacco Use   Smoking status: Former   Smokeless tobacco: Never  Scientific laboratory technician Use: Former   Substances: Flavoring  Substance and Sexual Activity   Alcohol use: No    Alcohol/week: 0.0 standard drinks of alcohol   Drug use: No   Sexual activity: Yes  Other Topics Concern   Not on file  Social History Narrative   ** Merged History Encounter **       Social Determinants of Health   Financial Resource Strain: Low Risk  (04/29/2022)   Overall Financial Resource Strain (CARDIA)    Difficulty of Paying Living Expenses: Not hard at all  Food Insecurity: No Food Insecurity (04/29/2022)   Hunger Vital Sign    Worried About Running Out of Food in the Last Year: Never true    Ran Out of Food in the Last Year: Never true  Transportation Needs: No Transportation Needs (04/29/2022)   PRAPARE - Hydrologist (Medical): No    Lack of Transportation (Non-Medical): No  Physical Activity: Insufficiently Active (04/29/2022)   Exercise Vital Sign    Days of Exercise per Week: 2 days    Minutes of Exercise per Session: 20 min  Stress: No Stress Concern  Present (04/29/2022)   [place]    Feeling of Stress : Only a little  Social Connections: Moderately Isolated (04/29/2022)   Social Connection and Isolation Panel [NHANES]    Frequency of Communication with Friends and Family: More than three times a week    Frequency of Social Gatherings with Friends and Family: Three times a week    Attends Religious Services: Never    Active Member of Clubs or Organizations: No    Attends Music therapist: Never    Marital Status: Married    Tobacco Counseling Counseling given: Not Answered   Clinical Intake:  Pre-visit preparation completed: Yes  Pain : 0-10 Pain Score: 8  Pain Location: Back Pain Radiating Towards: broken back     Nutritional Risks: None Diabetes: No  How often do you need to have someone help you when you read instructions, pamphlets, or other written materials from your doctor or pharmacy?: 1 - Never  Diabetic?no  Interpreter Needed?: No  Information entered by :: Kirke Shaggy, LPN   Activities of Daily Living    04/29/2022    1:13 PM  In your present state of health, do you have any difficulty performing the following activities:  Hearing? 0  Vision? 0  Difficulty concentrating or making decisions? 0  Walking or climbing stairs? 1  Dressing or bathing? 0  Doing errands, shopping? 0  Preparing Food and eating ? N  Using the Toilet? N  In the past six months, have you accidently leaked urine? N  Do you have problems with loss of bowel control? N  Managing your Medications? N  Managing your Finances? N  Housekeeping or managing your Housekeeping? N    Patient Care Team: Jon Billings, NP as PCP - General (Nurse Practitioner)  Indicate any recent Medical Services you may have received from other than [facility] providers in the past year (date may be approximate).     Assessment:   This is a routine wellness examination  for Ashantae.  Hearing/Vision screen Hearing Screening - Comments:: No aids Vision Screening -  Comments:: Readers- [facility]  Dietary issues and exercise activities discussed: Current Exercise Habits: Home exercise routine, Type of exercise: walking, Time (Minutes): 20, Frequency (Times/Week): 2, Weekly Exercise (Minutes/Week): 40, Intensity: Mild, Exercise limited by: orthopedic condition(s)   Goals Addressed             This Visit's Progress    DIET - EAT MORE FRUITS AND VEGETABLES         Depression Screen    04/29/2022    1:10 PM 02/05/2022    1:58 PM 11/08/2021    9:28 AM 10/10/2021    2:34 PM 05/03/2021    1:36 PM 04/03/2021   11:20 AM 01/16/2021    9:14 AM  PHQ 2/9 Scores  PHQ - 2 Score 0 '2 4 3 1 2 2  '$ PHQ- 9 Score 0 '9 8 6 3 4 4    '$ Fall Risk    04/29/2022    1:13 PM 02/05/2022    1:58 PM 11/08/2021    9:28 AM 10/10/2021    2:34 PM 05/03/2021    1:35 PM  Fall Risk   Falls in the past year? '1 1 1 '$ 0 0  Number falls in past yr: 1 0 1 0 0  Injury with Fall? 0 0 0 0 0  Risk for fall due to : History of fall(s) No Fall Risks Impaired balance/gait;Impaired mobility No Fall Risks No Fall Risks  Follow up Falls prevention discussed;Falls evaluation completed Falls evaluation completed Falls evaluation completed Falls evaluation completed Falls evaluation completed    FALL RISK PREVENTION PERTAINING TO THE HOME:  Any stairs in or around the home? No  If so, are there any without handrails? No  Home free of loose throw rugs in walkways, pet beds, electrical cords, etc? Yes  Adequate lighting in your home to reduce risk of falls? Yes   ASSISTIVE DEVICES UTILIZED TO PREVENT FALLS:  Life alert? No  Use of a cane, walker or w/c? Yes -occasionally both Grab bars in the bathroom? Yes  Shower chair or bench in shower? Yes  Elevated toilet seat or a handicapped toilet? Yes   Cognitive Function:        04/29/2022    1:19 PM 04/02/2018    1:29 PM 09/12/2016    2:00 PM   6CIT Screen  What Year? 0 points 0 points 0 points  What month? 0 points 0 points 0 points  What time? 0 points 0 points 0 points  Count back from 20 0 points 0 points 0 points  Months in reverse 0 points 0 points 0 points  Repeat phrase 0 points 0 points 0 points  Total Score 0 points 0 points 0 points    Immunizations Immunization History  Administered Date(s) Administered   Fluad Quad(high Dose 65+) 11/25/2018, 11/27/2019, 12/21/2020, 11/08/2021   Influenza,inj,Quad PF,6+ Mos 01/22/2017, 12/16/2017   Influenza-Unspecified 10/29/2011, 12/30/2012, 12/02/2013, 01/30/2015, 11/22/2015   PFIZER(Purple Top)SARS-COV-2 Vaccination 03/12/2019, 04/01/2019, 12/06/2019   Pneumococcal Conjugate-13 09/24/2018   Pneumococcal Polysaccharide-23 11/10/2009, 01/22/2017, 11/08/2021   Td 04/12/2008, 09/24/2018   Zoster Recombinat (Shingrix) 01/06/2018, 09/04/2018, 09/15/2018   Zoster, Live 06/28/2015    TDAP status: Up to date  Flu Vaccine status: Up to date  Pneumococcal vaccine status: Up to date  Covid-19 vaccine status: Completed vaccines  Qualifies for Shingles Vaccine? Yes   Zostavax completed Yes   Shingrix Completed?: Yes  Screening Tests Health Maintenance  Topic Date Due   MAMMOGRAM  08/19/2020   COVID-19 Vaccine (4 -  2023-24 season) 10/19/2021   Medicare Annual Wellness (AWV)  04/29/2023   COLONOSCOPY (Pts 45-72yr Insurance coverage will need to be confirmed)  10/07/2024   DTaP/Tdap/Td (3 - Tdap) 09/23/2028   Pneumonia Vaccine 69 Years old  Completed   INFLUENZA VACCINE  Completed   DEXA SCAN  Completed   Hepatitis C Screening  Completed   Zoster Vaccines- Shingrix  Completed   HPV VACCINES  Aged Out    Health Maintenance  Health Maintenance Due  Topic Date Due   MAMMOGRAM  08/19/2020   COVID-19 Vaccine (4 - 2023-24 season) 10/19/2021    Colorectal cancer screening: Type of screening: Colonoscopy. Completed 10/08/19. Repeat every 5 years  Mammogram status:  Ordered 02/05/22. Pt provided with contact info and advised to call to schedule appt.   BDS scheduled for 05/15/22  Lung Cancer Screening: (Low Dose CT Chest recommended if Age 69-80years, 30 pack-year currently smoking OR have quit w/in 15years.) does not qualify.    Additional Screening:  Hepatitis C Screening: does qualify; Completed 09/06/15  Vision Screening: Recommended annual ophthalmology exams for early detection of glaucoma and other disorders of the eye. Is the patient up to date with their annual eye exam?  Yes  Who is the provider or what is the name of the office in which the patient attends annual [facility] exams[facility] [place] [place] [facility] is not established with a provider, would they like to be referred to a provider to establish care? No .   Dental Screening: Recommended annual dental exams for proper oral hygiene  Community Resource Referral / Chronic Care Management: CRR required this visit?  No   CCM required this visit?  No      Plan:     I have personally reviewed and noted the following in the patient's chart:   Medical and social history Use of alcohol, tobacco or illicit drugs  Current medications and supplements including opioid prescriptions. Patient is currently taking opioid prescriptions. Information provided to patient regarding non-opioid alternatives. Patient advised to discuss non-opioid treatment plan with their provider. Functional ability and status Nutritional status Physical activity Advanced directives List of other physicians Hospitalizations, surgeries, and ER visits in previous 12 months Vitals Screenings to include cognitive, depression, and falls Referrals and appointments  In addition, I have reviewed and discussed with patient certain preventive protocols, quality metrics, and best practice recommendations. A written personalized care plan for preventive services as well as general preventive health recommendations were provided to  patient.     LDionisio David LPN   [id number]  Nurse Notes: none

## 2022-05-01 ENCOUNTER — Encounter: Payer: Self-pay | Admitting: Nurse Practitioner

## 2022-05-06 ENCOUNTER — Ambulatory Visit: Payer: Self-pay | Admitting: *Deleted

## 2022-05-06 NOTE — Telephone Encounter (Signed)
  Chief Complaint: Non productive cough and head congestion. Symptoms: Coughing a dry cough, runny nose Frequency: Started a week ago. Pertinent Negatives: Patient denies Fever, sore throat, diarrhea or vomiting, not coughing anything up, denies shortness of breath or tightness in chest. Disposition: [] ED /[] Urgent Care (no appt availability in office) / [] Appointment(In office/virtual)/ []  [facility]/ [x] Home Care/ [] Refused Recommended Disposition /[] [facility]/ []  Follow-up with PCP Additional Notes: Pt. Wants to try OTC medications first after I went over some of the Home Care Advice with her.     Pt. Was agreeable to this plan and will call us back with she gets worse.    I went over the s/s to call back for.     She mentioned her husband is sick too, getting it from her.

## 2022-05-06 NOTE — Telephone Encounter (Signed)
Message from Sharene Skeans sent at 05/06/2022  9:24 AM EDT  Summary: cold symptoms and medication   Pt asked if provider can send in some medication / she stated she has a chest and head cold with a cough/ pt stated she too sick to come in and doesn't have access to her mychart / please advise          Call History   Type Contact Phone/Fax User  05/06/2022 09:23 AM EDT Phone (Incoming) Theda Sers D "[name]" (Self) [phone] Lemmie Evens) Alanda Slim E   Reason for Disposition  Cough with cold symptoms (e.g., runny nose, postnasal drip, throat clearing)  Answer Assessment - Initial Assessment Questions 1. ONSET: "When did the cough begin?"      A week ago the symptoms started like a cold.    Runny nose.   No sore throat.  No diarrhea or vomiting. 2. SEVERITY: "How bad is the cough today?"      Dry frequent cough. 3. SPUTUM: "Describe the color of your sputum" (none, dry cough; clear, white, yellow, green)     Not coughing  4. HEMOPTYSIS: "Are you coughing up any blood?" If so ask: "How much?" (flecks, streaks, tablespoons, etc.)     Not asked 5. DIFFICULTY BREATHING: "Are you having difficulty breathing?" If Yes, ask: "How bad is it?" (e.g., mild, moderate, severe)    - MILD: No SOB at rest, mild SOB with walking, speaks normally in sentences, can lie down, no retractions, pulse < 100.    - MODERATE: SOB at rest, SOB with minimal exertion and prefers to sit, cannot lie down flat, speaks in phrases, mild retractions, audible wheezing, pulse 100-120.    - SEVERE: Very SOB at rest, speaks in single words, struggling to breathe, sitting hunched forward, retractions, pulse > 120      No shortness of breath or chest tightness 6. FEVER: "Do you have a fever?" If Yes, ask: "What is your temperature, how was it measured, and when did it start?"     No Congestion in my chest. It's congestion in my head and chest.    Green mucus from my nose. 7. CARDIAC HISTORY: "Do you have any history of heart  disease?" (e.g., heart attack, congestive heart failure)      No 8. LUNG HISTORY: "Do you have any history of lung disease?"  (e.g., pulmonary embolus, asthma, emphysema)     No 9. PE RISK FACTORS: "Do you have a history of blood clots?" (or: recent major surgery, recent prolonged travel, bedridden)     Not asked 10. OTHER SYMPTOMS: "Do you have any other symptoms?" (e.g., runny nose, wheezing, chest pain)       Runny nose and congestion in chest.   11. PREGNANCY: "Is there any chance you are pregnant?" "When was your last menstrual period?"       Not asked 12. TRAVEL: "Have you traveled out of the country in the last month?" (e.g., travel history, exposures)       No exposures.   My husband got it from me.  Protocols used: Cough - Acute Non-Productive-A-AH

## 2022-05-07 ENCOUNTER — Ambulatory Visit: Payer: Medicare Other | Admitting: Nurse Practitioner

## 2022-05-14 DIAGNOSIS — G894 Chronic pain syndrome: Secondary | ICD-10-CM | POA: Diagnosis not present

## 2022-05-14 DIAGNOSIS — M5136 Other intervertebral disc degeneration, lumbar region: Secondary | ICD-10-CM | POA: Diagnosis not present

## 2022-05-15 ENCOUNTER — Ambulatory Visit: Payer: Self-pay | Admitting: *Deleted

## 2022-05-15 ENCOUNTER — Other Ambulatory Visit: Payer: 59

## 2022-05-15 NOTE — Telephone Encounter (Signed)
  Chief Complaint: Medication request- oxycodone  Symptoms: chronic pain- hip/back Frequency: chronic- patient states pain clinic has closed- she will need to find new provider  Disposition: [] ED /[] Urgent Care (no appt availability in office) / [] Appointment(In office/virtual)/ []  [facility]/ [] Home Care/ [] Refused Recommended Disposition /[] [place]/ [x]  Follow-up with PCP Additional Notes: Patient is requesting help with Rx- she states she is out of medication and the pain clinic is closed with doors locked.

## 2022-05-15 NOTE — Telephone Encounter (Signed)
I just called the office of Amy Rocha and they are open and taking phone calls.  Patient will need to follow up with them for medication.

## 2022-05-15 NOTE — Telephone Encounter (Signed)
Summary: Back pain, wants to speak to PCP   Pt called reporting that her current pain clinic has closed down unbeknownst to her. She is seeking assistance from PCP requesting an urgent call back.  Best contact: [phone]         Reason for Disposition  [1] Caller has URGENT medicine question about med that PCP or specialist prescribed AND [2] triager unable to answer question  Answer Assessment - Initial Assessment Questions 1. NAME of MEDICINE: "What medicine(s) are you calling about?"     Oxycodone 20mg  #150 2. QUESTION: "What is your question?" (e.g., double dose of medicine, side effect)     Pain clinic has closed and patient is not able to get her Rx 3. PRESCRIBER: "Who prescribed the medicine?" Reason: if prescribed by specialist, call should be referred to that group.     Pain clinic- [facility], [place] 4. SYMPTOMS: "Do you have any symptoms?" If Yes, ask: "What symptoms are you having?"  "How bad are the symptoms (e.g., mild, moderate, severe)     Patient had appointment yesterday and was seen at pain clinic- she was told the system was down and they would send Rx at 2 pm- patient states the rx was never sent- she went to clinic today and they are closed with doors locked- patient is requesting PCP call Rx and she will find new provider. Patient states she can come to office to be seen if needed- she needs new pain clinic provider and Rx until she can be seen.  Patient states she has no medication- broken back- and is in emergent situation- patient would like call back from office to let her know if PCP can help.  Protocols used: Medication Question Call-A-AH

## 2022-05-16 NOTE — Telephone Encounter (Signed)
Called and notified patient of Karen's message. Patient states that she was able to get her medication today as her doctor sent it in last night for her.

## 2022-05-20 ENCOUNTER — Encounter: Payer: 59 | Admitting: Nurse Practitioner

## 2022-05-21 ENCOUNTER — Encounter: Payer: Self-pay | Admitting: Nurse Practitioner

## 2022-05-21 ENCOUNTER — Ambulatory Visit (INDEPENDENT_AMBULATORY_CARE_PROVIDER_SITE_OTHER): Payer: 59 | Admitting: Nurse Practitioner

## 2022-05-21 VITALS — BP 138/79 | HR 103 | Temp 98.2°F | Ht 64.5 in | Wt 145.5 lb

## 2022-05-21 DIAGNOSIS — Z0000 Encounter for general adult medical examination without abnormal findings: Secondary | ICD-10-CM

## 2022-05-21 DIAGNOSIS — I10 Essential (primary) hypertension: Secondary | ICD-10-CM | POA: Diagnosis not present

## 2022-05-21 DIAGNOSIS — J449 Chronic obstructive pulmonary disease, unspecified: Secondary | ICD-10-CM | POA: Diagnosis not present

## 2022-05-21 DIAGNOSIS — E559 Vitamin D deficiency, unspecified: Secondary | ICD-10-CM

## 2022-05-21 DIAGNOSIS — I509 Heart failure, unspecified: Secondary | ICD-10-CM

## 2022-05-21 DIAGNOSIS — E782 Mixed hyperlipidemia: Secondary | ICD-10-CM

## 2022-05-21 DIAGNOSIS — I25119 Atherosclerotic heart disease of native coronary artery with unspecified angina pectoris: Secondary | ICD-10-CM

## 2022-05-21 DIAGNOSIS — N184 Chronic kidney disease, stage 4 (severe): Secondary | ICD-10-CM

## 2022-05-21 DIAGNOSIS — F331 Major depressive disorder, recurrent, moderate: Secondary | ICD-10-CM

## 2022-05-21 DIAGNOSIS — M87051 Idiopathic aseptic necrosis of right femur: Secondary | ICD-10-CM | POA: Diagnosis not present

## 2022-05-21 DIAGNOSIS — M87052 Idiopathic aseptic necrosis of left femur: Secondary | ICD-10-CM | POA: Diagnosis not present

## 2022-05-21 DIAGNOSIS — D649 Anemia, unspecified: Secondary | ICD-10-CM | POA: Diagnosis not present

## 2022-05-21 DIAGNOSIS — I70209 Unspecified atherosclerosis of native arteries of extremities, unspecified extremity: Secondary | ICD-10-CM

## 2022-05-21 LAB — URINALYSIS, ROUTINE W REFLEX MICROSCOPIC
Bilirubin, UA: NEGATIVE
Glucose, UA: NEGATIVE
Ketones, UA: NEGATIVE
Leukocytes,UA: NEGATIVE
Nitrite, UA: NEGATIVE
Protein,UA: NEGATIVE
RBC, UA: NEGATIVE
Specific Gravity, UA: 1.01 (ref 1.005–1.030)
Urobilinogen, Ur: 0.2 mg/dL (ref 0.2–1.0)
pH, UA: 6 (ref 5.0–7.5)

## 2022-05-21 MED ORDER — LOSARTAN POTASSIUM 25 MG PO TABS: 25.0000 mg | ORAL_TABLET | Freq: Every day | ORAL | 1 refills | Status: DC

## 2022-05-21 MED ORDER — BUPROPION HCL ER (XL) 300 MG PO TB24: 300.0000 mg | ORAL_TABLET | Freq: Every morning | ORAL | 1 refills | Status: DC

## 2022-05-21 MED ORDER — VORTIOXETINE HBR 20 MG PO TABS: 20.0000 mg | ORAL_TABLET | Freq: Every day | ORAL | 1 refills | Status: DC

## 2022-05-21 NOTE — Assessment & Plan Note (Signed)
Chronic.  Controlled.  Continue with current medication regimen.  May need to see Nephrology in the future.  Labs ordered today.  Return to clinic in 3 months for reevaluation.  Call sooner if concerns arise.   

## 2022-05-21 NOTE — Assessment & Plan Note (Addendum)
Chronic.  Controlled.  Continue with current medication regimen.  Labs ordered today. Does not see Cardiology.  Return to clinic in 3 months for reevaluation.  Call sooner if concerns arise.

## 2022-05-21 NOTE — Assessment & Plan Note (Signed)
Labs ordered at visit today.  Will make recommendations based on lab results.  Likely due to CKD.

## 2022-05-21 NOTE — Assessment & Plan Note (Signed)
Chronic.  Controlled.  Continue with current medication regimen of Symbicort.  Labs ordered today.  Return to clinic in 3 months for reevaluation.  Can consider spirometry in the future if symptoms worsen.  Call sooner if concerns arise.

## 2022-05-21 NOTE — Assessment & Plan Note (Signed)
Stable and under good control, continue current regimen and recheck labs 

## 2022-05-21 NOTE — Assessment & Plan Note (Signed)
Chronic.  Controlled.  Continue with current medication regimen of Losartan.  Labs ordered today.  Return to clinic in 3 months for reevaluation.  Call sooner if concerns arise.   

## 2022-05-21 NOTE — Assessment & Plan Note (Signed)
Chronic.  Followed by [facility].  Has had several hip replacements.

## 2022-05-21 NOTE — Progress Notes (Signed)
BP 138/79   Pulse (!) 103   Temp 98.2 F (36.8 C) (Oral)   Ht 5' 4.5" (1.638 m)   Wt 145 lb 8 oz (66 kg)   LMP 09/13/1975 (Approximate)   SpO2 97%   BMI 24.59 kg/m    Subjective:    Patient ID: Amy Rocha, female    DOB: [birth year], 67 y.o.   MRN: [id number]  HPI: Amy Rocha is a 67 y.o. female presenting on 05/21/2022 for comprehensive medical examination. Current medical complaints include: sore on her face.  She currently lives with: Menopausal Symptoms: no  Patient has a sore on her face that she has been picking at it and it hasn't healed.    HYPERTENSION / HYPERLIPIDEMIA Satisfied with current treatment? yes Duration of hypertension: years BP monitoring frequency: not checking BP range:  BP medication side effects: no Past BP meds: losartan (cozaar) Duration of hyperlipidemia: years Cholesterol medication side effects: no Cholesterol supplements: none Past cholesterol medications: atorvastain (lipitor) Medication compliance: excellent compliance Aspirin: no Recent stressors: no Recurrent headaches: no Visual changes: no Palpitations: no Dyspnea: no Chest pain: no Lower extremity edema: no Dizzy/lightheaded: no  CHRONIC KIDNEY DISEASE CKD status: controlled Medications renally dose: yes Previous renal evaluation: no Pneumovax:  Up to Date Influenza Vaccine:  Up to Date   DEPRESSION Patient states she feels like she has been doing better lately.  Her back started hurting yesterday and that has her down today. Denies concerns at visit today.  Continues on Trintellix, Wellbutrin.  Depression Screen done today and results listed below:     05/21/2022    2:15 PM 04/29/2022    1:10 PM 02/05/2022    1:58 PM 11/08/2021    9:28 AM 10/10/2021    2:34 PM  Depression screen PHQ 2/9  Decreased Interest 1 0 1 2 2   Down, Depressed, Hopeless 0 0 1 2 1   PHQ - 2 Score 1 0 2 4 3   Altered sleeping 1 0 3 1 1   Tired, decreased energy 1 0 3 2 2   Change in  appetite 1 0 1 1 0  Feeling bad or failure about yourself  0 0 0 0 0  Trouble concentrating 0 0 0 0 0  Moving slowly or fidgety/restless 0 0 0 0 0  Suicidal thoughts 0 0 0 0 0  PHQ-9 Score 4 0 9 8 6   Difficult doing work/chores Not difficult at all Not difficult at all Not difficult at all Not difficult at all Somewhat difficult      05/21/2022    2:15 PM 02/05/2022    1:59 PM 11/08/2021    9:28 AM 10/10/2021    2:35 PM  GAD 7 : Generalized Anxiety Score  Nervous, Anxious, on Edge 0 1 2 3   Control/stop worrying 0 1 3 2   Worry too much - different things 0 0 3 2  Trouble relaxing 1 0 2 2  Restless 0 2 0 1  Easily annoyed or irritable 0 0 0 1  Afraid - awful might happen 0 0 0 1  Total GAD 7 Score 1 4 10 12   Anxiety Difficulty Not difficult at all Not difficult at all Somewhat difficult Somewhat difficult     The patient does not have a history of falls. I did complete a risk assessment for falls. A plan of care for falls was documented.   Past Medical History:  Past Medical History:  Diagnosis Date   Anxiety    Arthritis  Asthma    Avascular necrosis    Blood transfusion without reported diagnosis    Calculus of kidney    CHF (congestive heart failure)    Chronic pain syndrome    Complication of anesthesia    aspirated in recovery after  hip replacement 1998   COPD (chronic obstructive pulmonary disease)    Depression    GERD (gastroesophageal reflux disease)    Heart failure    History of kidney stones    Hyperlipidemia    Hypertension    IBS (irritable bowel syndrome)    Joint pain    Osteoporosis    Oxygen deficiency    Peripheral vascular disease    PONV (postoperative nausea and vomiting)    Urge incontinence     Surgical History:  Past Surgical History:  Procedure Laterality Date   avascular necrosis     CARDIAC CATHETERIZATION Left 12/01/2015   Procedure: Left Heart Cath and Coronary Angiography;  Surgeon: Teodoro Spray, MD;  Location: [facility];  Service: Cardiovascular;  Laterality: Left;   COLONOSCOPY WITH PROPOFOL N/A 10/08/2019   Procedure: COLONOSCOPY WITH PROPOFOL;  Surgeon: [name], MD;  Location: [facility] ENDOSCOPY;  Service: Gastroenterology;  Laterality: N/A;   ESOPHAGOGASTRODUODENOSCOPY (EGD) WITH PROPOFOL N/A 11/08/2019   Procedure: ESOPHAGOGASTRODUODENOSCOPY (EGD) WITH PROPOFOL;  Surgeon: [name], MD;  Location: [facility] ENDOSCOPY;  Service: Gastroenterology;  Laterality: N/A;   eyelid lift     HARDWARE REMOVAL Left 04/27/2020   Procedure: Left hip, deep hardware removal;  Surgeon: Hessie Knows, MD;  Location: [facility] ORS;  Service: Orthopedics;  Laterality: Left;   HERNIA REPAIR     INTRAMEDULLARY (IM) NAIL INTERTROCHANTERIC Left 10/14/2017   Procedure: REPAIR OF LEFT GREATER TROCHANTER NONUNION;  Surgeon: Hessie Knows, MD;  Location: [facility] ORS;  Service: Orthopedics;  Laterality: Left;   JOINT REPLACEMENT     hip   NASAL SINUS SURGERY     TOTAL ABDOMINAL HYSTERECTOMY     TOTAL HIP REVISION Left 01/21/2017   Procedure: TOTAL HIP REVISION FEMORAL STEM WITH POLYETHYLENE EXCHANGE WITH CUPLINER;  Surgeon: Hessie Knows, MD;  Location: [facility] ORS;  Service: Orthopedics;  Laterality: Left;    Medications:  Current Outpatient Medications on File Prior to Visit  Medication Sig   acetaminophen (TYLENOL) 500 MG tablet Take 1,000 mg by mouth 2 (two) times daily as needed for moderate pain or headache.    albuterol (VENTOLIN HFA) 108 (90 Base) MCG/ACT inhaler Inhale 2 puffs into the lungs every 6 (six) hours as needed for wheezing or shortness of breath.   atorvastatin (LIPITOR) 40 MG tablet TAKE 1 TABLET (40 MG TOTAL) BY MOUTH AT BEDTIME.   budesonide-formoterol (SYMBICORT) 160-4.5 MCG/ACT inhaler Inhale 2 puffs into the lungs 2 (two) times daily as needed (shortness of breath).   buPROPion (WELLBUTRIN XL) 150 MG 24 hr tablet TAKE 1 TABLET (150 MG TOTAL) BY MOUTH DAILY.   cromolyn (OPTICROM) 4 % ophthalmic solution  Place 1 drop into both eyes 4 (four) times daily. (Patient taking differently: Place 1 drop into both eyes 4 (four) times daily as needed (allergies). As needed)   fluticasone (FLONASE) 50 MCG/ACT nasal spray Place 2 sprays into both nostrils daily. (Patient taking differently: Place 2 sprays into both nostrils daily as needed for allergies.)   montelukast (SINGULAIR) 10 MG tablet TAKE 1 TABLET (10 MG TOTAL) BY MOUTH AT BEDTIME.   NARCAN 4 MG/0.1ML LIQD nasal spray kit Place 1 spray into the nose once.   omeprazole (PRILOSEC)  20 MG capsule TAKE 1 CAPSULE BY MOUTH DAILY.   ondansetron (ZOFRAN-ODT) 4 MG disintegrating tablet Take 1 tablet (4 mg total) by mouth every 8 (eight) hours as needed for nausea or vomiting.   Oxycodone HCl 20 MG TABS Take 1 tablet (20 mg total) by mouth every 4 (four) hours as needed (pain). Takes 5 daily.   rOPINIRole (REQUIP) 0.5 MG tablet Take 1 tablet (0.5 mg total) by mouth at bedtime as needed.   tiZANidine (ZANAFLEX) 2 MG tablet 1 tablet as needed Orally Three times a day for 30 days   valACYclovir (VALTREX) 500 MG tablet Take 4 tablets (2,000 mg total) by mouth 2 (two) times daily as needed.   No current facility-administered medications on file prior to visit.    Allergies:  Allergies  Allergen Reactions   Fluticasone-Salmeterol Swelling and Other (See Comments)    Tongue sores   Lactose Intolerance (Gi) Other (See Comments)    Bloating, GI upset, diarrhea   Tetracyclines & Related Other (See Comments)    Went in to the sun and broke out    Social History:  Social History   Socioeconomic History   Marital status: Unknown    Spouse name: Not on file   Number of children: Not on file   Years of education: Not on file   Highest education level: 12th grade  Occupational History   Occupation: disabled   Tobacco Use   Smoking status: Former   Smokeless tobacco: Never  Scientific laboratory technician Use: Former   Substances: Flavoring  Substance and Sexual  Activity   Alcohol use: No    Alcohol/week: 0.0 standard drinks of alcohol   Drug use: No   Sexual activity: Yes  Other Topics Concern   Not on file  Social History Narrative   ** Merged History Encounter **       Social Determinants of Health   Financial Resource Strain: Low Risk  (04/29/2022)   Overall Financial Resource Strain (CARDIA)    Difficulty of Paying Living Expenses: Not hard at all  Food Insecurity: No Food Insecurity (04/29/2022)   Hunger Vital Sign    Worried About Running Out of Food in the Last Year: Never true    Ran Out of Food in the Last Year: Never true  Transportation Needs: No Transportation Needs (04/29/2022)   PRAPARE - Hydrologist (Medical): No    Lack of Transportation (Non-Medical): No  Physical Activity: Insufficiently Active (04/29/2022)   Exercise Vital Sign    Days of Exercise per Week: 2 days    Minutes of Exercise per Session: 20 min  Stress: No Stress Concern Present (04/29/2022)   [place]    Feeling of Stress : Only a little  Social Connections: Moderately Isolated (04/29/2022)   Social Connection and Isolation Panel [NHANES]    Frequency of Communication with Friends and Family: More than three times a week    Frequency of Social Gatherings with Friends and Family: Three times a week    Attends Religious Services: Never    Active Member of Clubs or Organizations: No    Attends Archivist Meetings: Never    Marital Status: Married  Human resources officer Violence: Not At Risk (04/29/2022)   Humiliation, Afraid, Rape, and Kick questionnaire    Fear of Current or Ex-Partner: No    Emotionally Abused: No    Physically Abused: No    Sexually  Abused: No   Social History   Tobacco Use  Smoking Status Former  Smokeless Tobacco Never   Social History   Substance and Sexual Activity  Alcohol Use No   Alcohol/week: 0.0 standard drinks of  alcohol    Family History:  Family History  Problem Relation Age of Onset   Dementia Mother    Cancer Father        colon   Arthritis Brother    Hypertension Daughter    Aneurysm Brother     Past medical history, surgical history, medications, allergies, family history and social history reviewed with patient today and changes made to appropriate areas of the chart.   Review of Systems  Eyes:  Negative for blurred vision and double vision.  Respiratory:  Negative for shortness of breath.   Cardiovascular:  Negative for chest pain, palpitations and leg swelling.  Musculoskeletal:        Back pain  Neurological:  Negative for dizziness and headaches.   All other ROS negative except what is listed above and in the HPI.      Objective:    BP 138/79   Pulse (!) 103   Temp 98.2 F (36.8 C) (Oral)   Ht 5' 4.5" (1.638 m)   Wt 145 lb 8 oz (66 kg)   LMP 09/13/1975 (Approximate)   SpO2 97%   BMI 24.59 kg/m   Wt Readings from Last 3 Encounters:  05/21/22 145 lb 8 oz (66 kg)  04/29/22 141 lb (64 kg)  04/03/22 141 lb 3.2 oz (64 kg)    Physical Exam Vitals and nursing note reviewed.  Constitutional:      General: She is awake. She is not in acute distress.    Appearance: Normal appearance. She is well-developed and normal weight. She is not ill-appearing.  HENT:     Head: Normocephalic and atraumatic.     Right Ear: Hearing, tympanic membrane, ear canal and external ear normal. No drainage.     Left Ear: Hearing, tympanic membrane, ear canal and external ear normal. No drainage.     Nose: Nose normal.     Right Sinus: No maxillary sinus tenderness or frontal sinus tenderness.     Left Sinus: No maxillary sinus tenderness or frontal sinus tenderness.     Mouth/Throat:     Mouth: Mucous membranes are moist.     Pharynx: Oropharynx is clear. Uvula midline. No pharyngeal swelling, oropharyngeal exudate or posterior oropharyngeal erythema.  Eyes:     General: Lids are normal.         Right eye: No discharge.        Left eye: No discharge.     Extraocular Movements: Extraocular movements intact.     Conjunctiva/sclera: Conjunctivae normal.     Pupils: Pupils are equal, round, and reactive to light.     Visual Fields: Right eye visual fields normal and left eye visual fields normal.  Neck:     Thyroid: No thyromegaly.     Vascular: No carotid bruit.     Trachea: Trachea normal.  Cardiovascular:     Rate and Rhythm: Normal rate and regular rhythm.     Heart sounds: Normal heart sounds. No murmur heard.    No gallop.  Pulmonary:     Effort: Pulmonary effort is normal. No accessory muscle usage or respiratory distress.     Breath sounds: Normal breath sounds.  Chest:  Breasts:    Right: Normal.     Left: Normal.  Abdominal:     General: Bowel sounds are normal.     Palpations: Abdomen is soft. There is no hepatomegaly or splenomegaly.     Tenderness: There is no abdominal tenderness.  Musculoskeletal:        General: Normal range of motion.     Cervical back: Normal range of motion and neck supple.     Right lower leg: No edema.     Left lower leg: No edema.  Lymphadenopathy:     Head:     Right side of head: No submental, submandibular, tonsillar, preauricular or posterior auricular adenopathy.     Left side of head: No submental, submandibular, tonsillar, preauricular or posterior auricular adenopathy.     Cervical: No cervical adenopathy.     Upper Body:     Right upper body: No supraclavicular, axillary or pectoral adenopathy.     Left upper body: No supraclavicular, axillary or pectoral adenopathy.  Skin:    General: Skin is warm and dry.     Capillary Refill: Capillary refill takes less than 2 seconds.     Findings: No rash.  Neurological:     Mental Status: She is alert and oriented to person, place, and time.     Gait: Gait is intact.     Deep Tendon Reflexes: Reflexes are normal and symmetric.     Reflex Scores:      Brachioradialis  reflexes are 2+ on the right side and 2+ on the left side.      Patellar reflexes are 2+ on the right side and 2+ on the left side. Psychiatric:        Attention and Perception: Attention normal.        Mood and Affect: Mood normal.        Speech: Speech normal.        Behavior: Behavior normal. Behavior is cooperative.        Thought Content: Thought content normal.        Judgment: Judgment normal.     Results for orders placed or performed in visit on 02/05/22  Comp Met (CMET)  Result Value Ref Range   Glucose 89 70 - 99 mg/dL   BUN 6 (L) 8 - 27 mg/dL   Creatinine, Ser 1.05 (H) 0.57 - 1.00 mg/dL   eGFR 58 (L) >59 mL/min/1.73   BUN/Creatinine Ratio 6 (L) 12 - 28   Sodium 140 134 - 144 mmol/L   Potassium 4.4 3.5 - 5.2 mmol/L   Chloride 103 96 - 106 mmol/L   CO2 23 20 - 29 mmol/L   Calcium 9.4 8.7 - 10.3 mg/dL   Total Protein 6.3 6.0 - 8.5 g/dL   Albumin 3.9 3.9 - 4.9 g/dL   Globulin, Total 2.4 1.5 - 4.5 g/dL   Albumin/Globulin Ratio 1.6 1.2 - 2.2   Bilirubin Total 0.3 0.0 - 1.2 mg/dL   Alkaline Phosphatase 95 44 - 121 IU/L   AST 22 0 - 40 IU/L   ALT 15 0 - 32 IU/L  CBC w/Diff  Result Value Ref Range   WBC 6.5 3.4 - 10.8 x10E3/uL   RBC 4.61 3.77 - 5.28 x10E6/uL   Hemoglobin 14.2 11.1 - 15.9 g/dL   Hematocrit 42.6 34.0 - 46.6 %   MCV 92 79 - 97 fL   MCH 30.8 26.6 - 33.0 pg   MCHC 33.3 31.5 - 35.7 g/dL   RDW 13.0 11.7 - 15.4 %   Platelets 332 150 - 450 x10E3/uL  Neutrophils 56 Not Estab. %   Lymphs 27 Not Estab. %   Monocytes 10 Not Estab. %   Eos 5 Not Estab. %   Basos 2 Not Estab. %   Neutrophils Absolute 3.7 1.4 - 7.0 x10E3/uL   Lymphocytes Absolute 1.8 0.7 - 3.1 x10E3/uL   Monocytes Absolute 0.6 0.1 - 0.9 x10E3/uL   EOS (ABSOLUTE) 0.3 0.0 - 0.4 x10E3/uL   Basophils Absolute 0.1 0.0 - 0.2 x10E3/uL   Immature Granulocytes 0 Not Estab. %   Immature Grans (Abs) 0.0 0.0 - 0.1 x10E3/uL  Vitamin D (25 hydroxy)  Result Value Ref Range   Vit D, 25-Hydroxy 28.2 (L)  30.0 - 100.0 ng/mL      Assessment & Plan:   Problem List Items Addressed This Visit       Cardiovascular and Mediastinum   Atherosclerosis of coronary artery    Stable and under good control, continue current regimen and recheck labs      Relevant Medications   losartan (COZAAR) 25 MG tablet   CHF (congestive heart failure)    Chronic.  Controlled.  Continue with current medication regimen.  Labs ordered today. Does not see Cardiology.  Return to clinic in 3 months for reevaluation.  Call sooner if concerns arise.        Relevant Medications   losartan (COZAAR) 25 MG tablet   Atherosclerotic peripheral vascular disease   Relevant Medications   losartan (COZAAR) 25 MG tablet   Hypertension    Chronic.  Controlled.  Continue with current medication regimen of Losartan.  Labs ordered today.  Return to clinic in 3 months for reevaluation.  Call sooner if concerns arise.        Relevant Medications   losartan (COZAAR) 25 MG tablet     Respiratory   COPD (chronic obstructive pulmonary disease)    Chronic.  Controlled.  Continue with current medication regimen of Symbicort.  Labs ordered today.  Return to clinic in 3 months for reevaluation.  Can consider spirometry in the future if symptoms worsen.  Call sooner if concerns arise.          Musculoskeletal and Integument   Avascular necrosis of bones of both hips    Chronic.  Followed by [facility].  Has had several hip replacements.         Genitourinary   CKD (chronic kidney disease), stage IV    Chronic.  Controlled.  Continue with current medication regimen.  May need to see Nephrology in the future.  Labs ordered today.  Return to clinic in 3 months for reevaluation.  Call sooner if concerns arise.          Other   Anemia    Labs ordered at visit today.  Will make recommendations based on lab results.  Likely due to CKD.       Relevant Orders   CBC with Differential/Platelet   Hyperlipidemia    Labs  ordered today.  Continue with Atorvastatin 40mg  daily.  Will make recommendations based on lab results. Continue with Atorvastatin 40mg  daily.      Relevant Medications   losartan (COZAAR) 25 MG tablet   Other Relevant Orders   Lipid panel   Vitamin D deficiency    Labs ordered at visit today.  Will make recommendations based on lab results.        Relevant Orders   Vitamin D (25 hydroxy)   Moderate episode of recurrent major depressive disorder    Chronic.  Controlled.  Continue with current medication regimen of Wellbutrin and Trintellix.  Labs ordered today.  Return to clinic in 6 months for reevaluation.  Call sooner if concerns arise.        Relevant Medications   buPROPion (WELLBUTRIN XL) 300 MG 24 hr tablet   vortioxetine HBr (TRINTELLIX) 20 MG TABS tablet   Other Visit Diagnoses     Annual physical exam    -  Primary   Health maintenance reviewed during visit.  Labs ordered. Vaccines up to date. Colon screening up to date. Needs updated Mammogram but will wait at this time.   Relevant Orders   CBC with Differential/Platelet   Comprehensive metabolic panel   Lipid panel   TSH   Urinalysis, Routine w reflex microscopic   Vitamin D (25 hydroxy)        Follow up plan: Return in about 10 weeks (around 07/30/2022) for HTN, HLD, DM2 FU.   LABORATORY TESTING:  - Pap smear: not applicable  IMMUNIZATIONS:   - Tdap: Tetanus vaccination status reviewed: last tetanus booster within 10 years. - Influenza: Up to date - Pneumovax: Up to date - Prevnar: Up to date - COVID: Up to date - HPV: Not applicable - Shingrix vaccine: Up to date  SCREENING: -Mammogram: Ordered today - will hold off on scheduling until she finishes her back imaging - Colonoscopy: Up to date  - Bone Density: Up to date  -Hearing Test: Not applicable  -Spirometry: Not applicable   PATIENT COUNSELING:   Advised to take 1 mg of folate supplement per day if capable of pregnancy.   Sexuality:  Discussed sexually transmitted diseases, partner selection, use of condoms, avoidance of unintended pregnancy  and contraceptive alternatives.   Advised to avoid cigarette smoking.  I discussed with the patient that most people either abstain from alcohol or drink within safe limits (<=14/week and <=4 drinks/occasion for males, <=7/weeks and <= 3 drinks/occasion for females) and that the risk for alcohol disorders and other health effects rises proportionally with the number of drinks per week and how often a drinker exceeds daily limits.  Discussed cessation/primary prevention of drug use and availability of treatment for abuse.   Diet: Encouraged to adjust caloric intake to maintain  or achieve ideal body weight, to reduce intake of dietary saturated fat and total fat, to limit sodium intake by avoiding high sodium foods and not adding table salt, and to maintain adequate dietary potassium and calcium preferably from fresh fruits, vegetables, and low-fat dairy products.    stressed the importance of regular exercise  Injury prevention: Discussed safety belts, safety helmets, smoke detector, smoking near bedding or upholstery.   Dental health: Discussed importance of regular tooth brushing, flossing, and dental visits.    NEXT PREVENTATIVE PHYSICAL DUE IN 1 YEAR. Return in about 10 weeks (around 07/30/2022) for HTN, HLD, DM2 FU.

## 2022-05-21 NOTE — Assessment & Plan Note (Signed)
Labs ordered at visit today.  Will make recommendations based on lab results.   

## 2022-05-21 NOTE — Assessment & Plan Note (Signed)
Chronic.  Controlled.  Continue with current medication regimen of Wellbutrin and Trintellix.  Labs ordered today.  Return to clinic in 6 months for reevaluation.  Call sooner if concerns arise.

## 2022-05-21 NOTE — Assessment & Plan Note (Signed)
Labs ordered today.  Continue with Atorvastatin 40mg daily.  Will make recommendations based on lab results. Continue with Atorvastatin 40mg daily. °

## 2022-05-22 ENCOUNTER — Other Ambulatory Visit: Payer: Self-pay | Admitting: Nurse Practitioner

## 2022-05-22 LAB — COMPREHENSIVE METABOLIC PANEL
ALT: 18 IU/L (ref 0–32)
AST: 28 IU/L (ref 0–40)
Albumin/Globulin Ratio: 1.9 (ref 1.2–2.2)
Albumin: 4.2 g/dL (ref 3.9–4.9)
Alkaline Phosphatase: 131 IU/L — ABNORMAL HIGH (ref 44–121)
BUN/Creatinine Ratio: 14 (ref 12–28)
BUN: 11 mg/dL (ref 8–27)
Bilirubin Total: 1.1 mg/dL (ref 0.0–1.2)
CO2: 23 mmol/L (ref 20–29)
Calcium: 9.2 mg/dL (ref 8.7–10.3)
Chloride: 99 mmol/L (ref 96–106)
Creatinine, Ser: 0.8 mg/dL (ref 0.57–1.00)
Globulin, Total: 2.2 g/dL (ref 1.5–4.5)
Glucose: 94 mg/dL (ref 70–99)
Potassium: 4 mmol/L (ref 3.5–5.2)
Sodium: 138 mmol/L (ref 134–144)
Total Protein: 6.4 g/dL (ref 6.0–8.5)
eGFR: 80 mL/min/{1.73_m2} (ref >59)

## 2022-05-22 LAB — CBC WITH DIFFERENTIAL/PLATELET
Basophils Absolute: 0.1 10*3/uL (ref 0.0–0.2)
Basos: 1 %
EOS (ABSOLUTE): 0.1 10*3/uL (ref 0.0–0.4)
Eos: 1 %
Hematocrit: 37.8 % (ref 34.0–46.6)
Hemoglobin: 12.9 g/dL (ref 11.1–15.9)
Immature Grans (Abs): 0.1 10*3/uL (ref 0.0–0.1)
Immature Granulocytes: 1 %
Lymphocytes Absolute: 1.2 10*3/uL (ref 0.7–3.1)
Lymphs: 10 %
MCH: 31.6 pg (ref 26.6–33.0)
MCHC: 34.1 g/dL (ref 31.5–35.7)
MCV: 93 fL (ref 79–97)
Monocytes Absolute: 1.4 10*3/uL — ABNORMAL HIGH (ref 0.1–0.9)
Monocytes: 11 %
Neutrophils Absolute: 9.4 10*3/uL — ABNORMAL HIGH (ref 1.4–7.0)
Neutrophils: 76 %
Platelets: 351 10*3/uL (ref 150–450)
RBC: 4.08 x10E6/uL (ref 3.77–5.28)
RDW: 15.1 % (ref 11.7–15.4)
WBC: 12.2 10*3/uL — ABNORMAL HIGH (ref 3.4–10.8)

## 2022-05-22 LAB — VITAMIN D 25 HYDROXY (VIT D DEFICIENCY, FRACTURES): Vit D, 25-Hydroxy: 23.9 ng/mL — ABNORMAL LOW (ref 30.0–100.0)

## 2022-05-22 LAB — LIPID PANEL
Chol/HDL Ratio: 1.7 ratio (ref 0.0–4.4)
Cholesterol, Total: 190 mg/dL (ref 100–199)
HDL: 114 mg/dL (ref >39)
LDL Chol Calc (NIH): 62 mg/dL (ref 0–99)
Triglycerides: 80 mg/dL (ref 0–149)
VLDL Cholesterol Cal: 14 mg/dL (ref 5–40)

## 2022-05-22 LAB — TSH: TSH: 0.251 u[IU]/mL — ABNORMAL LOW (ref 0.450–4.500)

## 2022-05-22 MED ORDER — VITAMIN D (ERGOCALCIFEROL) 1.25 MG (50000 UNIT) PO CAPS: 50000.0000 [IU] | ORAL_CAPSULE | ORAL | 0 refills | Status: AC

## 2022-05-22 NOTE — Progress Notes (Signed)
Hi [name]. It was nice to see you yesterday.  Your lab work looks good.  Your thyroid labs are slightly abnormal but we will recheck this at your next visit.  No concerns at this time. Continue with your current medication regimen.  Follow up as discussed.  Please let me know if you have any questions.

## 2022-05-22 NOTE — Progress Notes (Signed)
Please let patient know that her vitamin D is low.  I have sent in a prescription for 50,000 IU of vitamin D for her to start taking once weekly.

## 2022-05-22 NOTE — Addendum Note (Signed)
Addended by: Jon Billings on: 05/22/2022 09:46 AM   Modules accepted: Orders

## 2022-05-23 NOTE — Telephone Encounter (Signed)
Requested Prescriptions  Pending Prescriptions Disp Refills   valACYclovir (VALTREX) 500 MG tablet [Pharmacy Med Name: VALACYCLOVIR HCL 500 MG TAB 500 Tablet] 720 tablet 1    Sig: TAKE 4 TABLETS BY MOUTH 2 TIMES DAILY AS NEEDED.     Antimicrobials:  Antiviral Agents - Anti-Herpetic Passed - 05/22/2022  4:21 PM      Passed - Valid encounter within last 12 months    Recent Outpatient Visits           2 days ago Annual physical exam   [place], Karen, NP   1 month ago Acute bilateral low back pain without sciatica   [place], [place], DO   3 months ago Chronic congestive heart failure, unspecified heart failure type [facility])   [place], NP   6 months ago Need for pneumococcal vaccine   [place], Karen, NP   7 months ago Hypertensive heart failure [facility])   [place] Jon Billings, NP       Future Appointments             In 2 months Jon Billings, NP [place], [facility]

## 2022-05-28 ENCOUNTER — Ambulatory Visit
Admission: RE | Admit: 2022-05-28 | Discharge: 2022-05-28 | Disposition: A | Discharge status: Home / Self Care | Payer: 59 | Source: Ambulatory Visit | Attending: Orthopedic Surgery | Admitting: Orthopedic Surgery

## 2022-05-28 DIAGNOSIS — S32030D Wedge compression fracture of third lumbar vertebra, subsequent encounter for fracture with routine healing: Secondary | ICD-10-CM | POA: Insufficient documentation

## 2022-05-28 DIAGNOSIS — S32030A Wedge compression fracture of third lumbar vertebra, initial encounter for closed fracture: Secondary | ICD-10-CM | POA: Diagnosis not present

## 2022-06-04 NOTE — Progress Notes (Deleted)
Referring Physician:  Larae Grooms, NP [address] [place],  [place] [address]  Primary Physician:  Larae Grooms, NP  History of Present Illness: 06/04/2022 Amy Rocha has a history of heart failure, CAD, CHF, PVD, HTN, asthma, COPD, GERD, IBS, osteoporosis, CKD. History of AVN of both hips.   She has known L3 compression fracture s/p injury at the end of January when she bent down. Previous MRI showed acute or subacute L3 body fracture with 70% height loss. 4 mm of retropulsion causes mild spinal canal narrowing at L2-3.  Dr. Myer Haff recommended CT scan and she is here to review it. She was also given LSO brace. She is now almost 3 months out from her injury.    She continues with constant LBP with constant right anterior thigh pain to her knee. No left leg pain. She has occasional numbness and tingling in her right thigh. No weakness.         MRI shows acute or subacute L3 body fracture with 70% height loss. 4 mm of retropulsion causes mild spinal canal narrowing at L2-3.  Seen by PCP on 03/29/22 with acute back pain that started after bending down to pick something up 2 weeks prior. Xrays showed an L3 compression fracture and she was referred here.   She has constant LBP with constant right anterior thigh pain to her knee. No left leg pain. She has numbness and tingling in boht legs. She has weakness in both legs. Pain is worse with standing and walking.   PCP has ordered a bone density study.   History of right THA. History of left THA with 8 revisions. She sees outside pain management Ronney Lion) and is on chronic oxycodone 20mg  five per day.   Bowel/Bladder Dysfunction: none  Conservative measures:  Physical therapy: none  Multimodal medical therapy including regular antiinflammatories: mobic, zanaflex, oxycodone, prednisone  Injections: No epidural steroid injections  Past Surgery: no spinal surgery  Amy Rocha has no symptoms of  cervical myelopathy.  The symptoms are causing a significant impact on the patient's life.   Review of Systems:  A 10 point review of systems is negative, except for the pertinent positives and negatives detailed in the HPI.  Past Medical History: Past Medical History:  Diagnosis Date   Anxiety    Arthritis    Asthma    Avascular necrosis    Blood transfusion without reported diagnosis    Calculus of kidney    CHF (congestive heart failure)    Chronic pain syndrome    Complication of anesthesia    aspirated in recovery after  hip replacement 1998   COPD (chronic obstructive pulmonary disease)    Depression    GERD (gastroesophageal reflux disease)    Heart failure    History of kidney stones    Hyperlipidemia    Hypertension    IBS (irritable bowel syndrome)    Joint pain    Osteoporosis    Oxygen deficiency    Peripheral vascular disease    PONV (postoperative nausea and vomiting)    Urge incontinence     Past Surgical History: Past Surgical History:  Procedure Laterality Date   avascular necrosis     CARDIAC CATHETERIZATION Left 12/01/2015   Procedure: Left Heart Cath and Coronary Angiography;  Surgeon: Dalia Heading, MD;  Location: [facility] INVASIVE CV LAB;  Service: Cardiovascular;  Laterality: Left;   COLONOSCOPY WITH PROPOFOL N/A 10/08/2019   Procedure: COLONOSCOPY WITH PROPOFOL;  Surgeon: Tobi Bastos,  Sharlet Salina, MD;  Location: [facility] ENDOSCOPY;  Service: Gastroenterology;  Laterality: N/A;   ESOPHAGOGASTRODUODENOSCOPY (EGD) WITH PROPOFOL N/A 11/08/2019   Procedure: ESOPHAGOGASTRODUODENOSCOPY (EGD) WITH PROPOFOL;  Surgeon: Wyline Mood, MD;  Location: [facility] ENDOSCOPY;  Service: Gastroenterology;  Laterality: N/A;   eyelid lift     HARDWARE REMOVAL Left 04/27/2020   Procedure: Left hip, deep hardware removal;  Surgeon: Kennedy Bucker, MD;  Location: [facility] ORS;  Service: Orthopedics;  Laterality: Left;   HERNIA REPAIR     INTRAMEDULLARY (IM) NAIL INTERTROCHANTERIC Left 10/14/2017    Procedure: REPAIR OF LEFT GREATER TROCHANTER NONUNION;  Surgeon: Kennedy Bucker, MD;  Location: [facility] ORS;  Service: Orthopedics;  Laterality: Left;   JOINT REPLACEMENT     hip   NASAL SINUS SURGERY     TOTAL ABDOMINAL HYSTERECTOMY     TOTAL HIP REVISION Left 01/21/2017   Procedure: TOTAL HIP REVISION FEMORAL STEM WITH POLYETHYLENE EXCHANGE WITH CUPLINER;  Surgeon: Kennedy Bucker, MD;  Location: [facility] ORS;  Service: Orthopedics;  Laterality: Left;    Allergies: Allergies as of 06/05/2022 - Review Complete 05/21/2022  Allergen Reaction Noted   Fluticasone-salmeterol Swelling and Other (See Comments) 09/08/2014   Lactose intolerance (gi) Other (See Comments) 09/06/2015   Tetracyclines & related Other (See Comments) 09/08/2014    Medications: Outpatient Encounter Medications as of 06/05/2022  Medication Sig   acetaminophen (TYLENOL) 500 MG tablet Take 1,000 mg by mouth 2 (two) times daily as needed for moderate pain or headache.    albuterol (VENTOLIN HFA) 108 (90 Base) MCG/ACT inhaler Inhale 2 puffs into the lungs every 6 (six) hours as needed for wheezing or shortness of breath.   atorvastatin (LIPITOR) 40 MG tablet TAKE 1 TABLET (40 MG TOTAL) BY MOUTH AT BEDTIME.   budesonide-formoterol (SYMBICORT) 160-4.5 MCG/ACT inhaler Inhale 2 puffs into the lungs 2 (two) times daily as needed (shortness of breath).   buPROPion (WELLBUTRIN XL) 150 MG 24 hr tablet TAKE 1 TABLET (150 MG TOTAL) BY MOUTH DAILY.   buPROPion (WELLBUTRIN XL) 300 MG 24 hr tablet Take 1 tablet (300 mg total) by mouth every morning.   cromolyn (OPTICROM) 4 % ophthalmic solution Place 1 drop into both eyes 4 (four) times daily. (Patient taking differently: Place 1 drop into both eyes 4 (four) times daily as needed (allergies). As needed)   fluticasone (FLONASE) 50 MCG/ACT nasal spray Place 2 sprays into both nostrils daily. (Patient taking differently: Place 2 sprays into both nostrils daily as needed for allergies.)   losartan  (COZAAR) 25 MG tablet Take 1 tablet (25 mg total) by mouth daily.   montelukast (SINGULAIR) 10 MG tablet TAKE 1 TABLET (10 MG TOTAL) BY MOUTH AT BEDTIME.   NARCAN 4 MG/0.1ML LIQD nasal spray kit Place 1 spray into the nose once.   omeprazole (PRILOSEC) 20 MG capsule TAKE 1 CAPSULE BY MOUTH DAILY.   ondansetron (ZOFRAN-ODT) 4 MG disintegrating tablet Take 1 tablet (4 mg total) by mouth every 8 (eight) hours as needed for nausea or vomiting.   Oxycodone HCl 20 MG TABS Take 1 tablet (20 mg total) by mouth every 4 (four) hours as needed (pain). Takes 5 daily.   rOPINIRole (REQUIP) 0.5 MG tablet Take 1 tablet (0.5 mg total) by mouth at bedtime as needed.   tiZANidine (ZANAFLEX) 2 MG tablet 1 tablet as needed Orally Three times a day for 30 days   valACYclovir (VALTREX) 500 MG tablet TAKE 4 TABLETS BY MOUTH 2 TIMES DAILY AS NEEDED.   Vitamin D, Ergocalciferol, (DRISDOL)  1.25 MG (50000 UNIT) CAPS capsule Take 1 capsule (50,000 Units total) by mouth every 7 (seven) days.   vortioxetine HBr (TRINTELLIX) 20 MG TABS tablet Take 1 tablet (20 mg total) by mouth daily.   No facility-administered encounter medications on file as of 06/05/2022.    Social History: Social History   Tobacco Use   Smoking status: Former   Smokeless tobacco: Never  Building services engineer Use: Former   Substances: Flavoring  Substance Use Topics   Alcohol use: No    Alcohol/week: 0.0 standard drinks of alcohol   Drug use: No    Family Medical History: Family History  Problem Relation Age of Onset   Dementia Mother    Cancer Father        colon   Arthritis Brother    Hypertension Daughter    Aneurysm Brother     Physical Examination: There were no vitals filed for this visit.    Awake, alert, oriented to person, place, and time.  Speech is clear and fluent. Fund of knowledge is appropriate.   Cranial Nerves: Pupils equal round and reactive to light.  Facial tone is symmetric.    ROM of lumbar spine not tested.    She has tenderness at L3 area and has diffuse lower lumbar tenderness.   No abnormal lesions on exposed skin.   Strength: Side Biceps Triceps Deltoid Interossei Grip Wrist Ext. Wrist Flex.  R L Side Iliopsoas Quads Hamstring PF DF EHL  R L Reflexes are 2+ and symmetric at the biceps, triceps, brachioradialis, patella and achilles.   Hoffman's is absent.  Clonus is not present.   Bilateral upper and lower extremity sensation is intact to light touch.     She has a limp favoring right leg.   Medical Decision Making  Imaging: Lumbar CT scan dated 05/28/22:  FINDINGS: Segmentation: 5 lumbar type vertebrae   Alignment: Normal.   Vertebrae: Known L3 compression fracture. Compared to L4 central height loss measures 70%. Retropulsion as previously noted affecting the superior and posterior endplate with moderate appearing spinal canal narrowing. Fracture margins have incorporated. No new fracture. Subjective osteopenia.   Paraspinal and other soft tissues: Atheromatous calcification of the aorta. Large but low-density cyst by MRI at the lower pole left kidney which appears simple where covered, no follow-up imaging is recommended.   Disc levels: Diffusely preserved disc height. No notable facet spurring. No visible impingement.   IMPRESSION: Known L3 compression fracture with 70% height loss that is unchanged since 04/10/2022 MRI. Expected interval healing. Retropulsion causes moderate spinal canal stenosis     Electronically Signed   By: Tiburcio Pea M.D.   On: 05/31/2022 04:37   I have personally reviewed the images and agree with the above interpretation.  Above CT reviewed with Dr. Myer Haff prior to this visit.   Assessment and Plan: Ms. Horgan is a pleasant 67 y.o. female with L3 compression fracture s/p injury 2-3 weeks ago (end of January) when she bent down. She has constant LBP with constant  right anterior thigh pain to her knee. No left leg pain.   History of right THA. History of left THA with 8 revisions. She sees outside pain management Ronney Lion) and is on chronic oxycodone  five per day.  She has known L3 compression  fracture.   Treatment options discussed with Dr. Myer Haff and following plan  made with patient:      Conservatively I think.  With how it is healing they might have trouble doing kypho.  The bottom part is healing but the top isn't fully solidified    - MRI of lumbar spine to further evaluate L3 compression fracture and right lumbar radiculopathy. - Continue current medications including chronic oxycodone from pain management. She will contact them to let them know about new compression fracture and that her pain is currently not well controlled.  - LSO brace ordered at [facility]. Do not wear in bed. Can remove when sitting and watching TV.  - No bending, twisting, or lifting.  - Will schedule follow up for phone visit to review MRI once I have the results back.   BP was elevated, it was 160/78 at end of visit. She has not taken her BP meds yet. No symptoms of chest pain, shortness of breath, blurry vision, or headaches.   She will go home and take BP meds. She is to recheck BP at home and call PCP if not improved. If she develops CP, SOB, blurry vision, or headaches, then she will go to ED.    I spent a total of 35 minutes in face-to-face and non-face-to-face activities related to this patient's care today including review of outside records, review of imaging, review of symptoms, physical exam, discussion of differential diagnosis, discussion of treatment options, and documentation.   Drake Leach PA-C Dept. of Neurosurgery

## 2022-06-05 ENCOUNTER — Ambulatory Visit: Payer: 59 | Admitting: Orthopedic Surgery

## 2022-06-07 ENCOUNTER — Emergency Department: Payer: 59

## 2022-06-07 ENCOUNTER — Emergency Department
Admission: EM | Admit: 2022-06-07 | Discharge: 2022-06-07 | Disposition: A | Discharge status: Home / Self Care | Payer: 59 | Attending: Emergency Medicine | Admitting: Emergency Medicine

## 2022-06-07 ENCOUNTER — Encounter: Payer: Self-pay | Admitting: Intensive Care

## 2022-06-07 ENCOUNTER — Other Ambulatory Visit: Payer: Self-pay

## 2022-06-07 ENCOUNTER — Telehealth (INDEPENDENT_AMBULATORY_CARE_PROVIDER_SITE_OTHER): Payer: 59 | Admitting: Family Medicine

## 2022-06-07 ENCOUNTER — Ambulatory Visit: Payer: Self-pay | Admitting: *Deleted

## 2022-06-07 DIAGNOSIS — N189 Chronic kidney disease, unspecified: Secondary | ICD-10-CM | POA: Diagnosis not present

## 2022-06-07 DIAGNOSIS — Z538 Procedure and treatment not carried out for other reasons: Secondary | ICD-10-CM

## 2022-06-07 DIAGNOSIS — I130 Hypertensive heart and chronic kidney disease with heart failure and stage 1 through stage 4 chronic kidney disease, or unspecified chronic kidney disease: Secondary | ICD-10-CM | POA: Diagnosis not present

## 2022-06-07 DIAGNOSIS — F10129 Alcohol abuse with intoxication, unspecified: Secondary | ICD-10-CM | POA: Diagnosis not present

## 2022-06-07 DIAGNOSIS — Z743 Need for continuous supervision: Secondary | ICD-10-CM | POA: Diagnosis not present

## 2022-06-07 DIAGNOSIS — R4182 Altered mental status, unspecified: Secondary | ICD-10-CM | POA: Insufficient documentation

## 2022-06-07 DIAGNOSIS — F32A Depression, unspecified: Secondary | ICD-10-CM | POA: Diagnosis present

## 2022-06-07 DIAGNOSIS — J45909 Unspecified asthma, uncomplicated: Secondary | ICD-10-CM | POA: Insufficient documentation

## 2022-06-07 DIAGNOSIS — I509 Heart failure, unspecified: Secondary | ICD-10-CM | POA: Diagnosis not present

## 2022-06-07 DIAGNOSIS — Y908 Blood alcohol level of 240 mg/100 ml or more: Secondary | ICD-10-CM | POA: Insufficient documentation

## 2022-06-07 DIAGNOSIS — F10920 Alcohol use, unspecified with intoxication, uncomplicated: Secondary | ICD-10-CM

## 2022-06-07 DIAGNOSIS — F332 Major depressive disorder, recurrent severe without psychotic features: Secondary | ICD-10-CM | POA: Insufficient documentation

## 2022-06-07 DIAGNOSIS — F411 Generalized anxiety disorder: Secondary | ICD-10-CM | POA: Diagnosis not present

## 2022-06-07 DIAGNOSIS — I499 Cardiac arrhythmia, unspecified: Secondary | ICD-10-CM | POA: Diagnosis not present

## 2022-06-07 LAB — COMPREHENSIVE METABOLIC PANEL
ALT: 43 U/L (ref 0–44)
AST: 81 U/L — ABNORMAL HIGH (ref 15–41)
Albumin: 3.7 g/dL (ref 3.5–5.0)
Alkaline Phosphatase: 115 U/L (ref 38–126)
Anion gap: 9 (ref 5–15)
BUN: 9 mg/dL (ref 8–23)
CO2: 24 mmol/L (ref 22–32)
Calcium: 8.8 mg/dL — ABNORMAL LOW (ref 8.9–10.3)
Chloride: 110 mmol/L (ref 98–111)
Creatinine, Ser: 1.06 mg/dL — ABNORMAL HIGH (ref 0.44–1.00)
GFR, Estimated: 57 mL/min — ABNORMAL LOW (ref >60)
Glucose, Bld: 112 mg/dL — ABNORMAL HIGH (ref 70–99)
Potassium: 3.7 mmol/L (ref 3.5–5.1)
Sodium: 143 mmol/L (ref 135–145)
Total Bilirubin: 0.4 mg/dL (ref 0.3–1.2)
Total Protein: 7.2 g/dL (ref 6.5–8.1)

## 2022-06-07 LAB — URINE DRUG SCREEN, QUALITATIVE (ARMC ONLY)
Amphetamines, Ur Screen: NOT DETECTED
Barbiturates, Ur Screen: NOT DETECTED
Benzodiazepine, Ur Scrn: NOT DETECTED
Cannabinoid 50 Ng, Ur ~~LOC~~: NOT DETECTED
Cocaine Metabolite,Ur ~~LOC~~: NOT DETECTED
MDMA (Ecstasy)Ur Screen: NOT DETECTED
Methadone Scn, Ur: NOT DETECTED
Opiate, Ur Screen: NOT DETECTED
Phencyclidine (PCP) Ur S: NOT DETECTED
Tricyclic, Ur Screen: NOT DETECTED

## 2022-06-07 LAB — CBC
HCT: 46.3 % — ABNORMAL HIGH (ref 36.0–46.0)
Hemoglobin: 14.9 g/dL (ref 12.0–15.0)
MCH: 31 pg (ref 26.0–34.0)
MCHC: 32.2 g/dL (ref 30.0–36.0)
MCV: 96.5 fL (ref 80.0–100.0)
Platelets: 544 10*3/uL — ABNORMAL HIGH (ref 150–400)
RBC: 4.8 MIL/uL (ref 3.87–5.11)
RDW: 15.7 % — ABNORMAL HIGH (ref 11.5–15.5)
WBC: 7.8 10*3/uL (ref 4.0–10.5)
nRBC: 0 % (ref 0.0–0.2)

## 2022-06-07 LAB — ETHANOL: Alcohol, Ethyl (B): 246 mg/dL — ABNORMAL HIGH (ref <10)

## 2022-06-07 LAB — ACETAMINOPHEN LEVEL: Acetaminophen (Tylenol), Serum: 29 ug/mL (ref 10–30)

## 2022-06-07 LAB — URINALYSIS, ROUTINE W REFLEX MICROSCOPIC
Bilirubin Urine: NEGATIVE
Glucose, UA: NEGATIVE mg/dL
Hgb urine dipstick: NEGATIVE
Ketones, ur: NEGATIVE mg/dL
Leukocytes,Ua: NEGATIVE
Nitrite: NEGATIVE
Protein, ur: NEGATIVE mg/dL
Specific Gravity, Urine: 1.003 — ABNORMAL LOW (ref 1.005–1.030)
pH: 6 (ref 5.0–8.0)

## 2022-06-07 LAB — T4, FREE: Free T4: 0.81 ng/dL (ref 0.61–1.12)

## 2022-06-07 LAB — TSH: TSH: 1.634 u[IU]/mL (ref 0.350–4.500)

## 2022-06-07 LAB — LIPASE, BLOOD: Lipase: 136 U/L — ABNORMAL HIGH (ref 11–51)

## 2022-06-07 LAB — SALICYLATE LEVEL: Salicylate Lvl: <7 mg/dL — ABNORMAL LOW (ref 7.0–30.0)

## 2022-06-07 MED ORDER — OXYCODONE HCL 5 MG PO TABS
20.0000 mg | ORAL_TABLET | Freq: Once | ORAL | Status: AC
  Administered 2022-06-07: 20 mg via ORAL
  Filled 2022-06-07: qty 4

## 2022-06-07 NOTE — Progress Notes (Signed)
Called to get visit started. Husband states that patient is hysterical and unable to speak. He has called 911 and is bringing her to the hospital because something is very wrong. Agree with his plan. Follow up after ER visit.

## 2022-06-07 NOTE — BH Assessment (Signed)
Comprehensive Clinical Assessment (CCA) Screening, Triage and Referral Note  06/07/2022 Amy Rocha [id number] Recommendations for Services/Supports/Treatments: Consulted with Rashaun D. NP, who determined pt. needs continued observation and reassessment in the AM.  Amy Coil. Beagley "[name]" is a 67 year old, English speaking, Caucasian female with a hx of anxiety and depression. Pt presented to [facility] ED [facility] due to concerns about pt. presenting with hysterically crying. Upon assessment, pt. endorsed feeling depressed and was able to identify stressors that included having various health issues since January. Pt stated, "All I do is lay." Pt reported having feelings of inadequacy and worthlessness as a wife and mother. Pt described her thoughts as worrisome and intrusive. Pt rated her depression at a 7 on a scale of 1-10 with 1 being lowest and ten being highest. Pt reported that she lives with her husband and things are going well in the marriage. Pt had some insight into why she'd presented to the hospital, explaining she'd snapped and was unsure what triggered it. The pt. had clear and coherent. Pt did not appear to be responding to internal or external stimuli and pt. denied hallucinating. Thoughts were linear and relevant. Pt presented with a labile mood; affect was congruent. Pt became tearful when discussing her worries about her family or feelings. Pt denied current SI/HI or AV/H. The pt. Explained that she may need a medication change. The pt reported having no hx of substance abuse treatment in the past. Pt's longest time she's been sober or the amount of drinking is unknown. The pt. unable to confirm or deny withdrawal symptoms. The pt. denied having substance use problem. Pt denied drinking prior to arrival despite having a BAL of 246.   Chief Complaint:  Chief Complaint  Patient presents with   Depression   Visit Diagnosis: GAD MDD recurrent, severe  Patient Reported Information How did  you hear about [place]? Other (Comment) (EMS)  What Is the Reason for Your Visit/Call Today? Patient arrived by [employer] from home. Takes daily depressants. Reports she feels depressed today more than normal. Reports she has been on and off sick since January and feels like its taken a toll on her     Reports she also has severe back pain from fractured back. Takes oxycodone for back per husband X20 years     reports emesis all day Tuesday     Husband reports patient has been crying a lot today. Patient keeps telling him how sorry she is for always being sick and her kids had no father growing up. Husband reports she kept screaming "Im sorry."     Denies SI/HI     Denies alcohol use           EMS vitals:  120 systol.  111HR  87CBG  98oral   Revision History  How Long Has This Been Causing You Problems? <Week  What Do You Feel Would Help You the Most Today? Medication(s)   Have You Recently Had Any Thoughts About Hurting Yourself? No  Are You Planning to Commit Suicide/Harm Yourself At This time? No   Have you Recently Had Thoughts About Hurting Someone Amy Rocha? No  Are You Planning to Harm Someone at This Time? No  Explanation: N/A   Have You Used Any Alcohol or Drugs in the Past 24 Hours? No  How Long Ago Did You Use Drugs or Alcohol? No data recorded What Did You Use and How Much? Pt denied ETHO use despite having a BAL of 246.  Do You Currently Have a Therapist/Psychiatrist? No  Name of Therapist/Psychiatrist: n/a   Have You Been Recently Discharged From Any Office Practice or Programs? -- (N/A)  Explanation of Discharge From Practice/Program: N/A    CCA Screening Triage Referral Assessment Type of Contact: Face-to-Face  Telemedicine Service Delivery:   Is this Initial or Reassessment?   Date Telepsych consult ordered in [facility]:    Time Telepsych consult ordered in [facility]:    Location of Assessment: [facility] ED  Provider Location: [facility] ED    Collateral Involvement:  Amy, Rocha (Spouse) [phone]   Does Patient Have a Court Appointed Legal Guardian? No data recorded Name and Contact of Legal Guardian: No data recorded If Minor and Not Living with Parent(s), Who has Custody? n/a  Is CPS involved or ever been involved? Never  Is APS involved or ever been involved? Never   Patient Determined To Be At Risk for Harm To Self or Others Based on Review of Patient Reported Information or Presenting Complaint? No  Method: No Plan  Availability of Means: No access or NA  Intent: Vague intent or NA  Notification Required: No need or identified person  Additional Information for Danger to Others Potential: -- (n/a)  Additional Comments for Danger to Others Potential: n/a  Are There Guns or Other Weapons in Your Home? No  Types of Guns/Weapons: n/a  Are These Weapons Safely Secured?                            No  Who Could Verify You Are Able To Have These Secured: n/a  Do You Have any Outstanding Charges, Pending Court Dates, Parole/Probation? n/a  Contacted To Inform of Risk of Harm To Self or Others: Other: Comment (n/a)   Does Patient Present under Involuntary Commitment? No    County of Residence: Other (Comment)   Patient Currently Receiving the Following Services: Medication Management   Determination of Need: Emergent (2 hours)   Options For Referral: ED Visit   Discharge Disposition:     Amy Rocha Amy Rocha, LCAS

## 2022-06-07 NOTE — Telephone Encounter (Signed)
Reason for Disposition . [1] Depression AND [2] worsening (e.g., sleeping poorly, less able to do activities of daily living)    Due to concerns for narcotic risk- advised ED  Answer Assessment - Initial Assessment Questions 1. CONCERN: "What happened that made you call today?"     Crying- upset 2. DEPRESSION SYMPTOM SCREENING: "How are you feeling overall?" (e.g., decreased energy, increased sleeping or difficulty sleeping, difficulty concentrating, feelings of sadness, guilt, hopelessness, or worthlessness)     Pain, hopelessness 3. RISK OF HARM - SUICIDAL IDEATION:  "Do you ever have thoughts of hurting or killing yourself?"  (e.g., yes, no, no but preoccupation with thoughts about death)   - INTENT:  "Do you have thoughts of hurting or killing yourself right NOW?" (e.g., yes, no, N/A)   - PLAN: "Do you have a specific plan for how you would do this?" (e.g., gun, knife, overdose, no plan, N/A)     No 4. RISK OF HARM - HOMICIDAL IDEATION:  "Do you ever have thoughts of hurting or killing someone else?"  (e.g., yes, no, no but preoccupation with thoughts about death)   - INTENT:  "Do you have thoughts of hurting or killing someone right NOW?" (e.g., yes, no, N/A)   - PLAN: "Do you have a specific plan for how you would do this?" (e.g., gun, knife, no plan, N/A)      no 5. FUNCTIONAL IMPAIRMENT: "How have things been going for you overall? Have you had more difficulty than usual doing your normal daily activities?"  (e.g., better, same, worse; self-care, school, work, interactions)     Recent surgery and pain, patient is participating ADOL 6. SUPPORT: "Who is with you now?" "Who do you live with?" "Do you have family or friends who you can talk to?"      Daughter, husband 7. THERAPIST: "Do you have a counselor or therapist? Name?"     no 8. STRESSORS: "Has there been any new stress or recent changes in your life?"     Recent surgery- hip replacement- 4 months ago, back broke 6 weeks ago 9.  ALCOHOL USE OR SUBSTANCE USE (DRUG USE): "Do you drink alcohol or use any illegal drugs?"     no 10. OTHER: "Do you have any other physical symptoms right now?" (e.g., fever)       Nausea/vomiting  Protocols used: Depression-A-AH

## 2022-06-07 NOTE — ED Triage Notes (Addendum)
Patient arrived by [employer] from home. Takes daily depressants. Reports she feels depressed today more than normal. Reports she has been on and off sick since January and feels like its taken a toll on her  Reports she also has severe back pain from fractured back. Takes oxycodone for back per husband X20 years  reports emesis all day Tuesday  Husband reports patient has been crying a lot today. Patient keeps telling him how sorry she is for always being sick and her kids had no father growing up. Husband reports she kept screaming "Im sorry."  Denies SI/HI  Denies alcohol use    EMS vitals: 120 systol. 111HR 87CBG 98oral

## 2022-06-07 NOTE — Telephone Encounter (Signed)
Patient and daughter on the line Chief Complaint: depression- patient is crying- told her daughter things were hopeless which prompted the call Symptoms: pain, hopelessness, emotional- but states she is able to do her ADOL Frequency: started today when she was talking to daughter Pertinent Negatives: Patient denies suicidal thoughts Disposition: ED /[] Urgent Care (no appt availability in office) / Appointment(In office/virtual)/  [facility]/ Home Care/ Refused Recommended Disposition /[] [place] [facility]/  Follow-up with PCP Additional Notes: Patient wants to see her provider- called to office and spoke to [facility]- no open appointments due to late hour- but did schedule appointment VV for Monday due to patient request for VV. When came back on line- husband was on line, patient was still crying and he was taking pain medication from her- advised daughter ED.

## 2022-06-07 NOTE — ED Provider Notes (Signed)
[facility] Provider Note    Event Date/Time   First MD Initiated Contact with Patient 06/07/22 1758     (approximate)   History   Depression   HPI  Amy Rocha is a 67 y.o. female   Past medical history of depression, anxiety, asthma, CHF, chronic pain from back injury on chronic opioids, hyperlipidemia, hypertension, osteoporosis who presents to the emergency department with depression.  She is here with her husband.  She feels depressed today, very tearful, and reminisces on her recent health ailments including pneumonia and chronic back pain.  She reports no acute medical complaints, no new trauma, no recent illnesses.  She denies any changes in her medications and has been fully compliant with all of her medications including her antidepressives.  She denies drug or alcohol use.  She had several episodes of vomiting 3 days ago that has completely resolved.  She does not have any GI complaints now, no nausea, no diarrhea, no GU complaints.  Upon review of her lab testing which shows markedly elevated ethanol she then admits to drinking a significant amount of alcohol today because she was feeling depressed and sad to drink.  She is not a daily drinker.  She does not use any other drugs.  Independent Historian contributed to assessment above: Husband who is at bedside who gives me information about her past medical history  External Medical Documents Reviewed: Office visit outpatient from 05/21/2022 which addresses annual exam, hypertension/hyperlipidemia and CKD & depression      Physical Exam   Triage Vital Signs: ED Triage Vitals  Enc Vitals Group     BP 06/07/22 1749 (!) 136/90     Pulse Rate 06/07/22 1749 (!) 103     Resp 06/07/22 1749 18     Temp 06/07/22 1749 98.1 F (36.7 C)     Temp Source 06/07/22 1749 Oral     SpO2 06/07/22 1749 96 %     Weight 06/07/22 1745 140 lb (63.5 kg)     Height 06/07/22 1745  (1.626 m)     Head  Circumference --      Peak Flow --      Pain Score 06/07/22 1744 8     Pain Loc --      Pain Edu? --      Excl. in GC? --     Most recent vital signs: Vitals:   06/07/22 1749  BP: (!) 136/90  Pulse: (!) 103  Resp: 18  Temp: 98.1 F (36.7 C)  SpO2: 96%    General: Awake, no distress.  CV:  Good peripheral perfusion.  Resp:  Normal effort.  Abd:  No distention.  Other:  Tearful.  Skin appears warm well-perfused, mentation is normal she is alert and oriented.  Neck supple with full range of motion, she has some mild lumbar spine tenderness chronic per patient, moving all extremities with full active range of motion soft nontender abdomen and clear lungs.   ED Results / Procedures / Treatments   Labs (all labs ordered are listed, but only abnormal results are displayed) Labs Reviewed  COMPREHENSIVE METABOLIC PANEL - Abnormal; Notable for the following components:      Result Value   Glucose, Bld 112 (*)    Creatinine, Ser 1.06 (*)    Calcium 8.8 (*)    AST 81 (*)    GFR, Estimated 57 (*)    All other components within normal limits  ETHANOL - Abnormal; Notable  for the following components:   Alcohol, Ethyl (B) 246 (*)    All other components within normal limits  SALICYLATE LEVEL - Abnormal; Notable for the following components:   Salicylate Lvl <7.0 (*)    All other components within normal limits  CBC - Abnormal; Notable for the following components:   HCT 46.3 (*)    RDW 15.7 (*)    Platelets 544 (*)    All other components within normal limits  URINALYSIS, ROUTINE W REFLEX MICROSCOPIC - Abnormal; Notable for the following components:   Color, Urine COLORLESS (*)    APPearance CLEAR (*)    Specific Gravity, Urine 1.003 (*)    All other components within normal limits  LIPASE, BLOOD - Abnormal; Notable for the following components:   Lipase 136 (*)    All other components within normal limits  ACETAMINOPHEN LEVEL  URINE DRUG SCREEN, QUALITATIVE ([facility] ONLY)   TSH  T4, FREE     I ordered and reviewed the above labs they are notable for ethanol level is markedly elevated in the 240s  RADIOLOGY I independently reviewed and interpreted CT scan of the head see no obvious bleeding or midline shift   PROCEDURES:  Critical Care performed: No  Procedures   MEDICATIONS ORDERED IN ED: Medications  oxyCODONE (Oxy IR/ROXICODONE) immediate release tablet 20 mg (20 mg Oral Given 06/07/22 1930)    IMPRESSION / MDM / ASSESSMENT AND PLAN / ED COURSE  I reviewed the triage vital signs and the nursing notes.                                Patient's presentation is most consistent with acute presentation with potential threat to life or bodily function.  Differential diagnosis includes, but is not limited to, depression, alcohol use, substance-induced mood disorder, thyroid dysfunction, metabolic derangements   The patient is on the cardiac monitor to evaluate for evidence of arrhythmia and/or significant heart rate changes.  MDM: Patient with depression and no other acute medical complaints.  She has a history of depression but never an episode this severe.  She admits to drinking significant amount of alcohol today but denies daily use.  She denies suicidality homicidality or hallucination.  She is been compliant with antidepressant.  Check basic labs, CT head, toxicologic workup, and thyroid studies for organic causes of her depression.  If all is normal she is amenable to meeting with psychiatry today.        FINAL CLINICAL IMPRESSION(S) / ED DIAGNOSES   Final diagnoses:  Depression, unspecified depression type  Alcoholic intoxication without complication     Rx / DC Orders   ED Discharge Orders     None        Note:  This document was prepared using Dragon voice recognition software and may include unintentional dictation errors.    Pilar Jarvis, MD 06/07/22 [phone]

## 2022-06-10 ENCOUNTER — Ambulatory Visit (INDEPENDENT_AMBULATORY_CARE_PROVIDER_SITE_OTHER): Payer: 59 | Admitting: Nurse Practitioner

## 2022-06-10 ENCOUNTER — Encounter: Payer: Self-pay | Admitting: Nurse Practitioner

## 2022-06-10 VITALS — BP 179/110 | HR 94 | Temp 98.0°F | Wt 140.1 lb

## 2022-06-10 DIAGNOSIS — F331 Major depressive disorder, recurrent, moderate: Secondary | ICD-10-CM | POA: Diagnosis not present

## 2022-06-10 MED ORDER — QUETIAPINE FUMARATE 25 MG PO TABS: 25.0000 mg | ORAL_TABLET | Freq: Every day | ORAL | 0 refills | Status: DC

## 2022-06-10 NOTE — Assessment & Plan Note (Signed)
Chronic. Not well controlled.  Symptoms are exacerbated due to pain.  Patient has been drinking alcohol to help with the pain.  Has an appointment with pain management tomorrow.  Encouraged patient to discuss alternative treatment plan with pain management provider.  Will add Seroquel  to regimen.  Side effects and benefits discussed.  Continue with Wellbutrin and Trintellix.  Follow up in 1 week.    Would ideally like to put patient on Vraylar or Rexulti but they are Tier 5 medications and patient must fail others before trying.  Will send one of the medications if patient does not tolerate Seroquel.

## 2022-06-10 NOTE — Progress Notes (Signed)
BP (!) 179/110 (BP Location: Right Arm, Cuff Size: Normal)   Pulse 94   Temp 98 F (36.7 C) (Oral)   Wt 140 lb 1.6 oz (63.5 kg)   LMP 09/13/1975 (Approximate)   SpO2 98%   BMI 24.05 kg/m    Subjective:    Patient ID: Amy Rocha, female    DOB: June 13, 1955, 67 y.o.   MRN: [id number]  HPI: Amy Rocha is a 68 y.o. female  Chief Complaint  Patient presents with   Depression   DEPRESSION Patient states she feels like since she broke her back she has been in so much pain.  She has been taking shots of liquor to help with her pain.  She is out of her pain medication.  She does not want to see a psychiatrist.   Mood status: uncontrolled Satisfied with current treatment?: no Symptom severity: severe  Duration of current treatment : years Side effects: no Medication compliance: excellent compliance Psychotherapy/counseling: no  none Previous psychiatric medications: abilify, effexor, and wellbutrin Tritellinx. Effexor made patient gain weight. She is not sure why she stopped the Abilify. Depressed mood: yes Anxious mood: yes Anhedonia: yes Significant weight loss or gain: no Insomnia: no  NA Fatigue: yes Feelings of worthlessness or guilt: yes Impaired concentration/indecisiveness: no Suicidal ideations: no Hopelessness: yes Crying spells: yes    06/10/2022   11:25 AM 05/21/2022    2:15 PM 04/29/2022    1:10 PM 02/05/2022    1:58 PM 11/08/2021    9:28 AM  Depression screen PHQ 2/9  Decreased Interest 3 1 0 1 2  Down, Depressed, Hopeless 3 0 0 1 2  PHQ - 2 Score 6 1 0 2 4  Altered sleeping 0 1 0 3 1  Tired, decreased energy 2 1 0 3 2  Change in appetite 2 1 0 1 1  Feeling bad or failure about yourself  3 0 0 0 0  Trouble concentrating 3 0 0 0 0  Moving slowly or fidgety/restless 0 0 0 0 0  Suicidal thoughts 0 0 0 0 0  PHQ-9 Score 16 4 0 9 8  Difficult doing work/chores Not difficult at all Not difficult at all Not difficult at all Not difficult at all Not  difficult at all    Relevant past medical, surgical, family and social history reviewed and updated as indicated. Interim medical history since our last visit reviewed. Allergies and medications reviewed and updated.  Review of Systems  Psychiatric/Behavioral:  Positive for dysphoric mood. Negative for suicidal ideas. The patient is nervous/anxious.     Per HPI unless specifically indicated above     Objective:    BP (!) 179/110 (BP Location: Right Arm, Cuff Size: Normal)   Pulse 94   Temp 98 F (36.7 C) (Oral)   Wt 140 lb 1.6 oz (63.5 kg)   LMP 09/13/1975 (Approximate)   SpO2 98%   BMI 24.05 kg/m   Wt Readings from Last 3 Encounters:  06/10/22 140 lb 1.6 oz (63.5 kg)  06/07/22 140 lb (63.5 kg)  05/21/22 145 lb 8 oz (66 kg)    Physical Exam Vitals and nursing note reviewed.  Constitutional:      General: She is not in acute distress.    Appearance: Normal appearance. She is normal weight. She is not ill-appearing, toxic-appearing or diaphoretic.  HENT:     Head: Normocephalic.     Right Ear: External ear normal.     Left Ear: External ear  normal.     Nose: Nose normal.     Mouth/Throat:     Mouth: Mucous membranes are moist.     Pharynx: Oropharynx is clear.  Eyes:     General:        Right eye: No discharge.        Left eye: No discharge.     Extraocular Movements: Extraocular movements intact.     Conjunctiva/sclera: Conjunctivae normal.     Pupils: Pupils are equal, round, and reactive to light.  Cardiovascular:     Rate and Rhythm: Normal rate and regular rhythm.     Heart sounds: No murmur heard. Pulmonary:     Effort: Pulmonary effort is normal. No respiratory distress.     Breath sounds: Normal breath sounds. No wheezing or rales.  Musculoskeletal:     Cervical back: Normal range of motion and neck supple.  Skin:    General: Skin is warm and dry.     Capillary Refill: Capillary refill takes less than 2 seconds.  Neurological:     General: No focal  deficit present.     Mental Status: She is alert and oriented to person, place, and time. Mental status is at baseline.  Psychiatric:        Mood and Affect: Mood normal.        Behavior: Behavior normal.        Thought Content: Thought content normal.        Judgment: Judgment normal.     Results for orders placed or performed during the hospital encounter of 06/07/22  Comprehensive metabolic panel  Result Value Ref Range   Sodium 143 135 - 145 mmol/L   Potassium 3.7 3.5 - 5.1 mmol/L   Chloride 110 98 - 111 mmol/L   CO2 24 22 - 32 mmol/L   Glucose, Bld 112 (H) 70 - 99 mg/dL   BUN 9 8 - 23 mg/dL   Creatinine, Ser 1.91 (H) 0.44 - 1.00 mg/dL   Calcium 8.8 (L) 8.9 - 10.3 mg/dL   Total Protein 7.2 6.5 - 8.1 g/dL   Albumin 3.7 3.5 - 5.0 g/dL   AST 81 (H) 15 - 41 U/L   ALT 43 0 - 44 U/L   Alkaline Phosphatase 115 38 - 126 U/L   Total Bilirubin 0.4 0.3 - 1.2 mg/dL   GFR, Estimated 57 (L) >60 mL/min   Anion gap 9 5 - 15  Ethanol  Result Value Ref Range   Alcohol, Ethyl (B) 246 (H) <10 mg/dL  Salicylate level  Result Value Ref Range   Salicylate Lvl <7.0 (L) 7.0 - 30.0 mg/dL  Acetaminophen level  Result Value Ref Range   Acetaminophen (Tylenol), Serum 29 10 - 30 ug/mL  cbc  Result Value Ref Range   WBC 7.8 4.0 - 10.5 K/uL   RBC 4.80 3.87 - 5.11 MIL/uL   Hemoglobin 14.9 12.0 - 15.0 g/dL   HCT 47.8 (H) 29.5 - 62.1 %   MCV 96.5 80.0 - 100.0 fL   MCH 31.0 26.0 - 34.0 pg   MCHC 32.2 30.0 - 36.0 g/dL   RDW 30.8 (H) 65.7 - 84.6 %   Platelets 544 (H) 150 - 400 K/uL   nRBC 0.0 0.0 - 0.2 %  Urine Drug Screen, Qualitative  Result Value Ref Range   Tricyclic, Ur Screen NONE DETECTED NONE DETECTED   Amphetamines, Ur Screen NONE DETECTED NONE DETECTED   MDMA (Ecstasy)Ur Screen NONE DETECTED NONE DETECTED   Cocaine Metabolite,Ur  [place] NONE DETECTED NONE DETECTED   Opiate, Ur Screen NONE DETECTED NONE DETECTED   Phencyclidine (PCP) Ur S NONE DETECTED NONE DETECTED   Cannabinoid 50 Ng,  Ur [place] NONE DETECTED NONE DETECTED   Barbiturates, Ur Screen NONE DETECTED NONE DETECTED   Benzodiazepine, Ur Scrn NONE DETECTED NONE DETECTED   Methadone Scn, Ur NONE DETECTED NONE DETECTED  Urinalysis, Routine w reflex microscopic -Urine, Clean Catch  Result Value Ref Range   Color, Urine COLORLESS (A) YELLOW   APPearance CLEAR (A) CLEAR   Specific Gravity, Urine 1.003 (L) 1.005 - 1.030   pH 6.0 5.0 - 8.0   Glucose, UA NEGATIVE NEGATIVE mg/dL   Hgb urine dipstick NEGATIVE NEGATIVE   Bilirubin Urine NEGATIVE NEGATIVE   Ketones, ur NEGATIVE NEGATIVE mg/dL   Protein, ur NEGATIVE NEGATIVE mg/dL   Nitrite NEGATIVE NEGATIVE   Leukocytes,Ua NEGATIVE NEGATIVE  Lipase, blood  Result Value Ref Range   Lipase 136 (H) 11 - 51 U/L  TSH  Result Value Ref Range   TSH 1.634 0.350 - 4.500 uIU/mL  T4, free  Result Value Ref Range   Free T4 0.81 0.61 - 1.12 ng/dL      Assessment & Plan:   Problem List Items Addressed This Visit       Other   Depression - Primary    Chronic. Not well controlled.  Symptoms are exacerbated due to pain.  Patient has been drinking alcohol to help with the pain.  Has an appointment with pain management tomorrow.  Encouraged patient to discuss alternative treatment plan with pain management provider.  Will add Seroquel  to regimen.  Side effects and benefits discussed.  Continue with Wellbutrin and Trintellix.  Follow up in 1 week.    Would ideally like to put patient on Vraylar or Rexulti but they are Tier 5 medications and patient must fail others before trying.  Will send one of the medications if patient does not tolerate Seroquel.        Follow up plan: Return in about 1 week (around 06/17/2022) for Depression/Anxiety FU.

## 2022-06-11 ENCOUNTER — Telehealth: Payer: Self-pay

## 2022-06-11 DIAGNOSIS — G5601 Carpal tunnel syndrome, right upper limb: Secondary | ICD-10-CM | POA: Diagnosis not present

## 2022-06-11 DIAGNOSIS — M5136 Other intervertebral disc degeneration, lumbar region: Secondary | ICD-10-CM | POA: Diagnosis not present

## 2022-06-11 DIAGNOSIS — G5602 Carpal tunnel syndrome, left upper limb: Secondary | ICD-10-CM | POA: Diagnosis not present

## 2022-06-11 NOTE — Telephone Encounter (Signed)
        Patient  visited [facility] on 06/07/2022  for depression.   Telephone encounter attempt :  1st  A HIPAA compliant voice message was left requesting a return call.  Instructed patient to call back at [phone].   Arlyne Brandes [facility]  [facility] Population Health Community Resource Care Guide   [email or web address]  ?? [phone]   Website: [email or web address]  [email or web address]

## 2022-06-12 ENCOUNTER — Telehealth: Payer: Self-pay

## 2022-06-12 NOTE — Telephone Encounter (Signed)
        Patient  visited [facility] on 06/07/2022  for depression.   Telephone encounter attempt :  2nd  A HIPAA compliant voice message was left requesting a return call.  Instructed patient to call back at [phone].   Mavryk Pino [facility]  [facility] Population Health Community Resource Care Guide   [email or web address]  ?? [phone]   Website: [email or web address]  [email or web address]

## 2022-06-13 ENCOUNTER — Telehealth: Payer: Self-pay | Admitting: *Deleted

## 2022-06-13 NOTE — Progress Notes (Signed)
  Care Coordination   Note   06/13/2022 Name: Amy Rocha MRN: [id number] DOB: 07-31-1955  [name] is a 67 y.o. year old female who sees Larae Grooms, NP for primary care. I reached out to [name] by phone today to offer care coordination services.  Ms. Runions was given information about Care Coordination services today including:   The Care Coordination services include support from the care team which includes your Nurse Coordinator, Clinical Social Worker, or Pharmacist.  The Care Coordination team is here to help remove barriers to the health concerns and goals most important to you. Care Coordination services are voluntary, and the patient may decline or stop services at any time by request to their care team member.   Care Coordination Consent Status: Patient agreed to services and verbal consent obtained.   Follow up plan:  Telephone appointment with care coordination team member scheduled for:  06/18/2022  Encounter Outcome:  Pt. Scheduled  Burman Nieves, CCMA Care Coordination Care Guide Direct Dial: [phone]

## 2022-06-17 ENCOUNTER — Encounter: Payer: Self-pay | Admitting: Nurse Practitioner

## 2022-06-17 ENCOUNTER — Ambulatory Visit
Admission: RE | Admit: 2022-06-17 | Discharge: 2022-06-17 | Disposition: A | Discharge status: Home / Self Care | Payer: 59 | Attending: Nurse Practitioner | Admitting: Nurse Practitioner

## 2022-06-17 ENCOUNTER — Ambulatory Visit (INDEPENDENT_AMBULATORY_CARE_PROVIDER_SITE_OTHER): Payer: 59 | Admitting: Nurse Practitioner

## 2022-06-17 ENCOUNTER — Ambulatory Visit
Admission: RE | Admit: 2022-06-17 | Discharge: 2022-06-17 | Disposition: A | Discharge status: Home / Self Care | Payer: 59 | Source: Ambulatory Visit | Attending: Nurse Practitioner | Admitting: Nurse Practitioner

## 2022-06-17 VITALS — Wt 149.9 lb

## 2022-06-17 DIAGNOSIS — F331 Major depressive disorder, recurrent, moderate: Secondary | ICD-10-CM | POA: Diagnosis not present

## 2022-06-17 DIAGNOSIS — R0781 Pleurodynia: Secondary | ICD-10-CM | POA: Insufficient documentation

## 2022-06-17 DIAGNOSIS — M25562 Pain in left knee: Secondary | ICD-10-CM | POA: Diagnosis not present

## 2022-06-17 MED ORDER — QUETIAPINE FUMARATE 25 MG PO TABS: 25.0000 mg | ORAL_TABLET | Freq: Every day | ORAL | 0 refills | Status: DC

## 2022-06-17 MED ORDER — METHYLPREDNISOLONE 4 MG PO TBPK: ORAL_TABLET | ORAL | 0 refills | Status: DC

## 2022-06-17 NOTE — Assessment & Plan Note (Signed)
Chronic.  Improved from two weeks ago.  Patient is tolerating Seroquel well.  She is more comfortable sleeping.  Not drinking any alcohol.  Continues with Wellbutrin and Trintellix.  Follow up in 1 month.  Call sooner if concerns arise.

## 2022-06-17 NOTE — Progress Notes (Signed)
Wt 149 lb 14.4 oz (68 kg)   LMP 09/13/1975 (Approximate)   BMI 25.73 kg/m    Subjective:    Patient ID: Amy Rocha, female    DOB: November 05, 1955, 67 y.o.   MRN: [id number]  HPI: Amy Rocha is a 67 y.o. female  No chief complaint on file.  DEPRESSION Patient states she feels like she is feeling better than she was at our last visit.  She was started on Seroquel at last visit.  Her husband feels like she is back to herself.  She is sleeping better.  She isn't drinking any alcohol.  Mood status: improved Satisfied with current treatment?: no Symptom severity: moderate Duration of current treatment : years Side effects: no Medication compliance: excellent compliance Psychotherapy/counseling: no  none Previous psychiatric medications: abilify, effexor, and wellbutrin Tritellinx. Effexor made patient gain weight. She is not sure why she stopped the Abilify. Depressed mood: yes Anxious mood: yes Anhedonia: yes Significant weight loss or gain: no Insomnia: no  NA Fatigue: yes Feelings of worthlessness or guilt: yes Impaired concentration/indecisiveness: no Suicidal ideations: no Hopelessness: yes Crying spells: yes    06/17/2022   10:14 AM 06/10/2022   11:25 AM 05/21/2022    2:15 PM 04/29/2022    1:10 PM 02/05/2022    1:58 PM  Depression screen PHQ 2/9  Decreased Interest 1 3 1  0 1  Down, Depressed, Hopeless 1 3 0 0 1  PHQ - 2 Score 2 6 1  0 2  Altered sleeping 2 0 1 0 3  Tired, decreased energy 3 2 1  0 3  Change in appetite 0 2 1 0 1  Feeling bad or failure about yourself  0 3 0 0 0  Trouble concentrating 0 3 0 0 0  Moving slowly or fidgety/restless 0 0 0 0 0  Suicidal thoughts 0 0 0 0 0  PHQ-9 Score 7 16 4  0 9  Difficult doing work/chores Not difficult at all Not difficult at all Not difficult at all Not difficult at all Not difficult at all    Patient states she fell on Saturday after tripping on her pocket book.  Her rib pain is bad on the right side and has  left knee pain.  States it hurts to breath and cough. Walking with a limp.    Relevant past medical, surgical, family and social history reviewed and updated as indicated. Interim medical history since our last visit reviewed. Allergies and medications reviewed and updated.  Review of Systems  Musculoskeletal:        Left knee and right rib pain  Psychiatric/Behavioral:  Positive for dysphoric mood. Negative for suicidal ideas. The patient is nervous/anxious.     Per HPI unless specifically indicated above     Objective:    Wt 149 lb 14.4 oz (68 kg)   LMP 09/13/1975 (Approximate)   BMI 25.73 kg/m   Wt Readings from Last 3 Encounters:  06/17/22 149 lb 14.4 oz (68 kg)  06/10/22 140 lb 1.6 oz (63.5 kg)  06/07/22 140 lb (63.5 kg)    Physical Exam Vitals and nursing note reviewed.  Constitutional:      General: She is not in acute distress.    Appearance: Normal appearance. She is normal weight. She is not ill-appearing, toxic-appearing or diaphoretic.  HENT:     Head: Normocephalic.     Right Ear: External ear normal.     Left Ear: External ear normal.     Nose: Nose normal.  Mouth/Throat:     Mouth: Mucous membranes are moist.     Pharynx: Oropharynx is clear.  Eyes:     General:        Right eye: No discharge.        Left eye: No discharge.     Extraocular Movements: Extraocular movements intact.     Conjunctiva/sclera: Conjunctivae normal.     Pupils: Pupils are equal, round, and reactive to light.  Cardiovascular:     Rate and Rhythm: Normal rate and regular rhythm.     Heart sounds: No murmur heard. Pulmonary:     Effort: Pulmonary effort is normal. No respiratory distress.     Breath sounds: Normal breath sounds. No wheezing or rales.  Chest:    Musculoskeletal:     Cervical back: Normal range of motion and neck supple.     Left knee: Swelling present. No erythema. Tenderness present.  Skin:    General: Skin is warm and dry.     Capillary Refill:  Capillary refill takes less than 2 seconds.  Neurological:     General: No focal deficit present.     Mental Status: She is alert and oriented to person, place, and time. Mental status is at baseline.  Psychiatric:        Mood and Affect: Mood normal.        Behavior: Behavior normal.        Thought Content: Thought content normal.        Judgment: Judgment normal.     Results for orders placed or performed during the hospital encounter of 06/07/22  Comprehensive metabolic panel  Result Value Ref Range   Sodium 143 135 - 145 mmol/L   Potassium 3.7 3.5 - 5.1 mmol/L   Chloride 110 98 - 111 mmol/L   CO2 24 22 - 32 mmol/L   Glucose, Bld 112 (H) 70 - 99 mg/dL   BUN 9 8 - 23 mg/dL   Creatinine, Ser 6.29 (H) 0.44 - 1.00 mg/dL   Calcium 8.8 (L) 8.9 - 10.3 mg/dL   Total Protein 7.2 6.5 - 8.1 g/dL   Albumin 3.7 3.5 - 5.0 g/dL   AST 81 (H) 15 - 41 U/L   ALT 43 0 - 44 U/L   Alkaline Phosphatase 115 38 - 126 U/L   Total Bilirubin 0.4 0.3 - 1.2 mg/dL   GFR, Estimated 57 (L) >60 mL/min   Anion gap 9 5 - 15  Ethanol  Result Value Ref Range   Alcohol, Ethyl (B) 246 (H) <10 mg/dL  Salicylate level  Result Value Ref Range   Salicylate Lvl <7.0 (L) 7.0 - 30.0 mg/dL  Acetaminophen level  Result Value Ref Range   Acetaminophen (Tylenol), Serum 29 10 - 30 ug/mL  cbc  Result Value Ref Range   WBC 7.8 4.0 - 10.5 K/uL   RBC 4.80 3.87 - 5.11 MIL/uL   Hemoglobin 14.9 12.0 - 15.0 g/dL   HCT 52.8 (H) 41.3 - 24.4 %   MCV 96.5 80.0 - 100.0 fL   MCH 31.0 26.0 - 34.0 pg   MCHC 32.2 30.0 - 36.0 g/dL   RDW 01.0 (H) 27.2 - 53.6 %   Platelets 544 (H) 150 - 400 K/uL   nRBC 0.0 0.0 - 0.2 %  Urine Drug Screen, Qualitative  Result Value Ref Range   Tricyclic, Ur Screen NONE DETECTED NONE DETECTED   Amphetamines, Ur Screen NONE DETECTED NONE DETECTED   MDMA (Ecstasy)Ur Screen NONE DETECTED NONE DETECTED  Cocaine Metabolite,Ur [place] NONE DETECTED NONE DETECTED   Opiate, Ur Screen NONE DETECTED NONE  DETECTED   Phencyclidine (PCP) Ur S NONE DETECTED NONE DETECTED   Cannabinoid 50 Ng, Ur [place] NONE DETECTED NONE DETECTED   Barbiturates, Ur Screen NONE DETECTED NONE DETECTED   Benzodiazepine, Ur Scrn NONE DETECTED NONE DETECTED   Methadone Scn, Ur NONE DETECTED NONE DETECTED  Urinalysis, Routine w reflex microscopic -Urine, Clean Catch  Result Value Ref Range   Color, Urine COLORLESS (A) YELLOW   APPearance CLEAR (A) CLEAR   Specific Gravity, Urine 1.003 (L) 1.005 - 1.030   pH 6.0 5.0 - 8.0   Glucose, UA NEGATIVE NEGATIVE mg/dL   Hgb urine dipstick NEGATIVE NEGATIVE   Bilirubin Urine NEGATIVE NEGATIVE   Ketones, ur NEGATIVE NEGATIVE mg/dL   Protein, ur NEGATIVE NEGATIVE mg/dL   Nitrite NEGATIVE NEGATIVE   Leukocytes,Ua NEGATIVE NEGATIVE  Lipase, blood  Result Value Ref Range   Lipase 136 (H) 11 - 51 U/L  TSH  Result Value Ref Range   TSH 1.634 0.350 - 4.500 uIU/mL  T4, free  Result Value Ref Range   Free T4 0.81 0.61 - 1.12 ng/dL      Assessment & Plan:   Problem List Items Addressed This Visit       Other   Moderate episode of recurrent major depressive disorder (HCC)    Chronic.  Improved from two weeks ago.  Patient is tolerating Seroquel well.  She is more comfortable sleeping.  Not drinking any alcohol.  Continues with Wellbutrin and Trintellix.  Follow up in 1 month.  Call sooner if concerns arise.       Other Visit Diagnoses     Acute pain of left knee    -  Primary   Will obtain xray. Patient already on high dose pain medication. Will treat with prednisone to help with swelling.   Relevant Orders   DG Knee Complete 4 Views Left   Rib pain on right side       Will obtain xray. Patient already on high dose pain medication. Will treat with prednisone to help with swelling.   Relevant Orders   DG Ribs Unilateral Right        Follow up plan: Return in about 1 month (around 07/17/2022) for Depression/Anxiety FU.    A total of 30 minutes were spent on this  encounter today.  When total time is documented, this includes both the face-to-face and non-face-to-face time personally spent before, during and after the visit on the date of the encounter symptoms, medications, pain and follow up.

## 2022-06-18 ENCOUNTER — Ambulatory Visit: Payer: Self-pay | Admitting: *Deleted

## 2022-06-18 NOTE — Patient Outreach (Signed)
  Care Coordination   Initial Visit Note   06/18/2022 Name: Amy Rocha MRN: [id number] DOB: [birth year]  Amy Rocha is a 67 y.o. year old female who sees Larae Grooms, NP for primary care. I spoke with  Amy Rocha by phone today.  What matters to the patients health and wellness today?  Patient presented to  the ED on 4/19.24 due to increased depression. Per patient, she is feeling much better-denying any thoughts of harm to self or others. Patient able to verbalize positive coping strategies to manage her depression and is open to ongoing mental health counseling. Patient further states that her provider has added a new anti-depressant.     Goals Addressed             This Visit's Progress    Mental health resources       Activities and task to complete in order to accomplish goals.   Self Support options  (patient verbalizes having a positive support network who promote increasing socialization) Patient agreeable to ongoing mental health follow up-CSW will provided resources         SDOH assessments and interventions completed:  Yes  SDOH Interventions Today    Flowsheet Row Most Recent Value  SDOH Interventions   Food Insecurity Interventions Intervention Not Indicated  Housing Interventions Intervention Not Indicated  Transportation Interventions Intervention Not Indicated  Depression Interventions/Treatment  --  [patient states that she does not want to see a psychiatrist but is interested in seeing therapist]        Care Coordination Interventions:  Yes, provided  Interventions Today    Flowsheet Row Most Recent Value  Chronic Disease   Chronic disease during today's visit Other  [depression]  General Interventions   General Interventions Discussed/Reviewed General Interventions Discussed, Community Resources  Mental Health Interventions   Mental Health Discussed/Reviewed --  [Patient discussed trigger for recent depression inactivity due to  various illnessess.  Patient states mood has improved due to added anti-drepressant-patient willing to follow up with a therapist for ongoing mental health support]  Pharmacy Interventions   Pharmacy Dicussed/Reviewed Medication Adherence  Medication Adherence --  [patient states that she believes that the added anti-depressant may be causing weight gain, she will discuss these concerns with her provider]  Safety Interventions   Safety Discussed/Reviewed Safety Discussed  [Patient encouraged to contact [phone] in the event of a mental health crisis]       Follow up plan: Follow up call scheduled for 06/25/22    Encounter Outcome:  Pt. Visit Completed

## 2022-06-18 NOTE — Patient Instructions (Signed)
Visit Information  Thank you for taking time to visit with me today. Please don't hesitate to contact me if I can be of assistance to you.   Following are the goals we discussed today:   Goals Addressed             This Visit's Progress    Mental health resources       Activities and task to complete in order to accomplish goals.   Self Support options  (patient verbalizes having a positive support network who promote increasing socialization) Patient agreeable to ongoing mental health follow up-CSW will provided resources         Our next appointment is by telephone on 06/25/22 at 11:30am  Please call the care guide team at [phone] if you need to cancel or reschedule your appointment.   If you are experiencing a Mental Health or Behavioral Health Crisis or need someone to talk to, please call [phone]   Patient verbalizes understanding of instructions and care plan provided today and agrees to view in MyChart. Active MyChart status and patient understanding of how to access instructions and care plan via MyChart confirmed with patient.     Telephone follow up appointment with care management team member scheduled for: 06/25/22  Verna Czech, LCSW Clinical Social Worker  [facility] Care Management [phone]

## 2022-06-20 NOTE — Progress Notes (Signed)
Please let patient know that she does not have a rib fracture.  I recommend continuing with her pain medication, resting, and using ice to help with the pain.  Rib injuries will take time to health and resting is the best this we can do right now.

## 2022-06-20 NOTE — Progress Notes (Signed)
Please let patient know that her Knee xray shows she does have some arthritis in her knee.  However, there is no fracture or injury from her fall.

## 2022-06-25 ENCOUNTER — Encounter: Payer: Self-pay | Admitting: *Deleted

## 2022-06-25 ENCOUNTER — Telehealth: Payer: Self-pay | Admitting: *Deleted

## 2022-06-25 NOTE — Patient Outreach (Signed)
  Care Coordination   06/25/2022 Name: Amy Rocha MRN: [id number] DOB: 1955/05/21   Care Coordination Outreach Attempts:  An unsuccessful telephone outreach was attempted for a scheduled appointment today.  Follow Up Plan:  Additional outreach attempts will be made to offer the patient care coordination information and services.   Encounter Outcome:  No Answer   Care Coordination Interventions:  No, not indicated    Yelena Metzer, LCSW Clinical Social Worker  [facility] Care Management [phone]

## 2022-07-09 DIAGNOSIS — Z79891 Long term (current) use of opiate analgesic: Secondary | ICD-10-CM | POA: Diagnosis not present

## 2022-07-09 DIAGNOSIS — G894 Chronic pain syndrome: Secondary | ICD-10-CM | POA: Diagnosis not present

## 2022-07-09 DIAGNOSIS — Z5181 Encounter for therapeutic drug level monitoring: Secondary | ICD-10-CM | POA: Diagnosis not present

## 2022-07-09 DIAGNOSIS — M5136 Other intervertebral disc degeneration, lumbar region: Secondary | ICD-10-CM | POA: Diagnosis not present

## 2022-07-17 ENCOUNTER — Encounter: Payer: Self-pay | Admitting: Nurse Practitioner

## 2022-07-17 ENCOUNTER — Ambulatory Visit (INDEPENDENT_AMBULATORY_CARE_PROVIDER_SITE_OTHER): Payer: 59 | Admitting: Nurse Practitioner

## 2022-07-17 VITALS — BP 137/84 | HR 84 | Temp 98.7°F | Wt 150.2 lb

## 2022-07-17 DIAGNOSIS — F331 Major depressive disorder, recurrent, moderate: Secondary | ICD-10-CM | POA: Diagnosis not present

## 2022-07-17 DIAGNOSIS — J3089 Other allergic rhinitis: Secondary | ICD-10-CM | POA: Diagnosis not present

## 2022-07-17 MED ORDER — METHYLPREDNISOLONE 4 MG PO TBPK: ORAL_TABLET | ORAL | 0 refills | Status: DC

## 2022-07-17 MED ORDER — QUETIAPINE FUMARATE 25 MG PO TABS: 25.0000 mg | ORAL_TABLET | Freq: Every day | ORAL | 1 refills | Status: DC

## 2022-07-17 MED ORDER — FLUTICASONE PROPIONATE 50 MCG/ACT NA SUSP: 2.0000 | Freq: Every day | NASAL | 6 refills | Status: DC

## 2022-07-17 NOTE — Assessment & Plan Note (Signed)
Chronic.  Improved.  Patient is tolerating Seroquel well.   Continue with current medication regimen.  Not drinking any alcohol.  Continues with Wellbutrin and Trintellix.  Follow up in 3 months.  Call sooner if concerns arise.

## 2022-07-17 NOTE — Assessment & Plan Note (Signed)
Will treat with prednisone. Complete course of medication. Flonase sent in.  Follow up if not improved.

## 2022-07-17 NOTE — Progress Notes (Signed)
BP 137/84   Pulse 84   Temp 98.7 F (37.1 C) (Oral)   Wt 150 lb 3.2 oz (68.1 kg)   LMP 09/13/1975 (Approximate)   SpO2 97%   BMI 25.78 kg/m    Subjective:    Patient ID: Amy Rocha, female    DOB: 1955-10-05, 67 y.o.   MRN: [id number]  HPI: Amy Rocha is a 67 y.o. female  Chief Complaint  Patient presents with   Depression   Anxiety   DEPRESSION Patient states she feels like she has been okay.  She isn't sure how much the medication is helping.  She doesn't feel as on edge as she was.   Mood status: improved Satisfied with current treatment?: no Symptom severity: moderate Duration of current treatment : years Side effects: no Medication compliance: excellent compliance Psychotherapy/counseling: no  none Previous psychiatric medications: abilify, effexor, and wellbutrin Tritellinx. Effexor made patient gain weight. She is not sure why she stopped the Abilify. Depressed mood: yes Anxious mood: yes Anhedonia: yes Significant weight loss or gain: no Insomnia: no  NA Fatigue: yes Feelings of worthlessness or guilt: yes Impaired concentration/indecisiveness: no Suicidal ideations: no Hopelessness: yes Crying spells: yes    07/17/2022    1:19 PM 06/18/2022   10:34 AM 06/17/2022   10:14 AM 06/10/2022   11:25 AM 05/21/2022    2:15 PM  Depression screen PHQ 2/9  Decreased Interest 1 1 1 3 1   Down, Depressed, Hopeless 1 1 1 3  0  PHQ - 2 Score 2 2 2 6 1   Altered sleeping 1 2 2  0 1  Tired, decreased energy 1 3 3 2 1   Change in appetite 0 1 0 2 1  Feeling bad or failure about yourself  0 0 0 3 0  Trouble concentrating 0 0 0 3 0  Moving slowly or fidgety/restless 0 0 0 0 0  Suicidal thoughts 0 0 0 0 0  PHQ-9 Score 4 8 7 16 4   Difficult doing work/chores Not difficult at all  Not difficult at all Not difficult at all Not difficult at all   UPPER RESPIRATORY TRACT INFECTION Worst symptom: Fever: no Cough: yes Shortness of breath: no Wheezing: no Chest pain:  yes, with cough Chest tightness: no Chest congestion: no Nasal congestion: yes Runny nose: yes Post nasal drip: yes Sneezing: yes Sore throat: no Swollen glands: no Sinus pressure: yes Headache: yes Face pain: yes Toothache: no Ear pain: no bilateral Ear pressure: no bilateral Eyes red/itching:no Eye drainage/crusting: no  Vomiting: no Rash: no Fatigue: yes Sick contacts: no Strep contacts: no  Context: worse Recurrent sinusitis: no Relief with OTC cold/cough medications: no  Treatments attempted: none     Relevant past medical, surgical, family and social history reviewed and updated as indicated. Interim medical history since our last visit reviewed. Allergies and medications reviewed and updated.  Review of Systems  Constitutional:  Positive for fatigue. Negative for fever.  HENT:  Positive for congestion, postnasal drip, rhinorrhea, sinus pressure and sinus pain. Negative for dental problem, ear pain, sneezing and sore throat.   Respiratory:  Positive for cough. Negative for shortness of breath and wheezing.   Cardiovascular:  Negative for chest pain.  Gastrointestinal:  Negative for vomiting.  Skin:  Negative for rash.  Neurological:  Positive for headaches.  Psychiatric/Behavioral:  Positive for dysphoric mood. Negative for suicidal ideas. The patient is nervous/anxious.     Per HPI unless specifically indicated above     Objective:  BP 137/84   Pulse 84   Temp 98.7 F (37.1 C) (Oral)   Wt 150 lb 3.2 oz (68.1 kg)   LMP 09/13/1975 (Approximate)   SpO2 97%   BMI 25.78 kg/m   Wt Readings from Last 3 Encounters:  07/17/22 150 lb 3.2 oz (68.1 kg)  06/17/22 149 lb 14.4 oz (68 kg)  06/10/22 140 lb 1.6 oz (63.5 kg)    Physical Exam Vitals and nursing note reviewed.  Constitutional:      General: She is not in acute distress.    Appearance: Normal appearance. She is normal weight. She is not ill-appearing, toxic-appearing or diaphoretic.  HENT:      Head: Normocephalic.     Right Ear: External ear normal.     Left Ear: External ear normal.     Nose: Congestion and rhinorrhea present.     Mouth/Throat:     Mouth: Mucous membranes are moist.     Pharynx: Oropharynx is clear. Posterior oropharyngeal erythema present. No oropharyngeal exudate.  Eyes:     General:        Right eye: No discharge.        Left eye: No discharge.     Extraocular Movements: Extraocular movements intact.     Conjunctiva/sclera: Conjunctivae normal.     Pupils: Pupils are equal, round, and reactive to light.  Cardiovascular:     Rate and Rhythm: Normal rate and regular rhythm.     Heart sounds: No murmur heard. Pulmonary:     Effort: Pulmonary effort is normal. No respiratory distress.     Breath sounds: Normal breath sounds. No wheezing or rales.  Musculoskeletal:     Cervical back: Normal range of motion and neck supple.     Left knee: Swelling present. No erythema. Tenderness present.  Skin:    General: Skin is warm and dry.     Capillary Refill: Capillary refill takes less than 2 seconds.  Neurological:     General: No focal deficit present.     Mental Status: She is alert and oriented to person, place, and time. Mental status is at baseline.  Psychiatric:        Mood and Affect: Mood normal.        Behavior: Behavior normal.        Thought Content: Thought content normal.        Judgment: Judgment normal.     Results for orders placed or performed during the hospital encounter of 06/07/22  Comprehensive metabolic panel  Result Value Ref Range   Sodium 143 135 - 145 mmol/L   Potassium 3.7 3.5 - 5.1 mmol/L   Chloride 110 98 - 111 mmol/L   CO2 24 22 - 32 mmol/L   Glucose, Bld 112 (H) 70 - 99 mg/dL   BUN 9 8 - 23 mg/dL   Creatinine, Ser 0.98 (H) 0.44 - 1.00 mg/dL   Calcium 8.8 (L) 8.9 - 10.3 mg/dL   Total Protein 7.2 6.5 - 8.1 g/dL   Albumin 3.7 3.5 - 5.0 g/dL   AST 81 (H) 15 - 41 U/L   ALT 43 0 - 44 U/L   Alkaline Phosphatase 115 38 -  126 U/L   Total Bilirubin 0.4 0.3 - 1.2 mg/dL   GFR, Estimated 57 (L) >60 mL/min   Anion gap 9 5 - 15  Ethanol  Result Value Ref Range   Alcohol, Ethyl (B) 246 (H) <10 mg/dL  Salicylate level  Result Value Ref Range  Salicylate Lvl <7.0 (L) 7.0 - 30.0 mg/dL  Acetaminophen level  Result Value Ref Range   Acetaminophen (Tylenol), Serum 29 10 - 30 ug/mL  cbc  Result Value Ref Range   WBC 7.8 4.0 - 10.5 K/uL   RBC 4.80 3.87 - 5.11 MIL/uL   Hemoglobin 14.9 12.0 - 15.0 g/dL   HCT 16.1 (H) 09.6 - 04.5 %   MCV 96.5 80.0 - 100.0 fL   MCH 31.0 26.0 - 34.0 pg   MCHC 32.2 30.0 - 36.0 g/dL   RDW 40.9 (H) 81.1 - 91.4 %   Platelets 544 (H) 150 - 400 K/uL   nRBC 0.0 0.0 - 0.2 %  Urine Drug Screen, Qualitative  Result Value Ref Range   Tricyclic, Ur Screen NONE DETECTED NONE DETECTED   Amphetamines, Ur Screen NONE DETECTED NONE DETECTED   MDMA (Ecstasy)Ur Screen NONE DETECTED NONE DETECTED   Cocaine Metabolite,Ur [place] NONE DETECTED NONE DETECTED   Opiate, Ur Screen NONE DETECTED NONE DETECTED   Phencyclidine (PCP) Ur S NONE DETECTED NONE DETECTED   Cannabinoid 50 Ng, Ur [place] NONE DETECTED NONE DETECTED   Barbiturates, Ur Screen NONE DETECTED NONE DETECTED   Benzodiazepine, Ur Scrn NONE DETECTED NONE DETECTED   Methadone Scn, Ur NONE DETECTED NONE DETECTED  Urinalysis, Routine w reflex microscopic -Urine, Clean Catch  Result Value Ref Range   Color, Urine COLORLESS (A) YELLOW   APPearance CLEAR (A) CLEAR   Specific Gravity, Urine 1.003 (L) 1.005 - 1.030   pH 6.0 5.0 - 8.0   Glucose, UA NEGATIVE NEGATIVE mg/dL   Hgb urine dipstick NEGATIVE NEGATIVE   Bilirubin Urine NEGATIVE NEGATIVE   Ketones, ur NEGATIVE NEGATIVE mg/dL   Protein, ur NEGATIVE NEGATIVE mg/dL   Nitrite NEGATIVE NEGATIVE   Leukocytes,Ua NEGATIVE NEGATIVE  Lipase, blood  Result Value Ref Range   Lipase 136 (H) 11 - 51 U/L  TSH  Result Value Ref Range   TSH 1.634 0.350 - 4.500 uIU/mL  T4, free  Result Value Ref Range    Free T4 0.81 0.61 - 1.12 ng/dL      Assessment & Plan:   Problem List Items Addressed This Visit       Respiratory   Allergic rhinitis    Will treat with prednisone. Complete course of medication. Flonase sent in.  Follow up if not improved.          Other   Moderate episode of recurrent major depressive disorder (HCC) - Primary    Chronic.  Improved.  Patient is tolerating Seroquel well.   Continue with current medication regimen.  Not drinking any alcohol.  Continues with Wellbutrin and Trintellix.  Follow up in 3 months.  Call sooner if concerns arise.         Follow up plan: Return in about 3 months (around 10/17/2022) for HTN, HLD, DM2 FU.

## 2022-07-23 ENCOUNTER — Other Ambulatory Visit: Payer: Self-pay | Admitting: Nurse Practitioner

## 2022-07-23 NOTE — Telephone Encounter (Signed)
Requested Prescriptions  Pending Prescriptions Disp Refills   montelukast (SINGULAIR) 10 MG tablet [Pharmacy Med Name: MONTELUKAST SOD 10 MG TAB 10 Tablet] 90 tablet 3    Sig: TAKE 1 TABLET (10 MG TOTAL) BY MOUTH AT BEDTIME.     Pulmonology:  Leukotriene Inhibitors Passed - 07/23/2022 10:00 AM      Passed - Valid encounter within last 12 months    Recent Outpatient Visits           6 days ago Moderate episode of recurrent major depressive disorder [facility])   [place] [facility] Larae Grooms, NP   1 month ago Acute pain of left knee   [place] [facility] Larae Grooms, NP   1 month ago Moderate episode of recurrent major depressive disorder [facility])   [place] [facility] Larae Grooms, NP   1 month ago Appointment canceled by hospital   [place] [facility] Dorcas Carrow, DO   2 months ago Annual physical exam   [place] [facility] Larae Grooms, NP       Future Appointments             In 2 months Mecum, Oswaldo Conroy, PA-C  [facility]

## 2022-07-30 ENCOUNTER — Telehealth: Payer: 59 | Admitting: Nurse Practitioner

## 2022-08-06 DIAGNOSIS — M5136 Other intervertebral disc degeneration, lumbar region: Secondary | ICD-10-CM | POA: Diagnosis not present

## 2022-08-06 DIAGNOSIS — G894 Chronic pain syndrome: Secondary | ICD-10-CM | POA: Diagnosis not present

## 2022-08-12 ENCOUNTER — Ambulatory Visit (INDEPENDENT_AMBULATORY_CARE_PROVIDER_SITE_OTHER): Payer: 59 | Admitting: Nurse Practitioner

## 2022-08-12 ENCOUNTER — Encounter: Payer: Self-pay | Admitting: Nurse Practitioner

## 2022-08-12 VITALS — BP 133/80 | HR 102 | Temp 98.7°F | Wt 166.0 lb

## 2022-08-12 DIAGNOSIS — M7989 Other specified soft tissue disorders: Secondary | ICD-10-CM | POA: Diagnosis not present

## 2022-08-12 DIAGNOSIS — F331 Major depressive disorder, recurrent, moderate: Secondary | ICD-10-CM

## 2022-08-12 DIAGNOSIS — K649 Unspecified hemorrhoids: Secondary | ICD-10-CM

## 2022-08-12 MED ORDER — HYDROCORTISONE ACETATE 25 MG RE SUPP: 25.0000 mg | Freq: Two times a day (BID) | RECTAL | 0 refills | Status: DC

## 2022-08-12 MED ORDER — FUROSEMIDE 20 MG PO TABS: 20.0000 mg | ORAL_TABLET | Freq: Every day | ORAL | 0 refills | Status: DC

## 2022-08-12 NOTE — Progress Notes (Signed)
BP 133/80 (BP Location: Left Arm, Cuff Size: Normal)   Pulse (!) 102   Temp 98.7 F (37.1 C) (Oral)   Wt 166 lb (75.3 kg)   LMP 09/13/1975 (Approximate)   SpO2 95%   BMI 28.49 kg/m    Subjective:    Patient ID: Amy Rocha, female    DOB: [birth year], 67 y.o.   MRN: [id number]  HPI: Amy Rocha is a 67 y.o. female  Chief Complaint  Patient presents with   Hemorrhoids    Pt states she has been having issues with her hemorrhoids about once a month, but states she has been having issues for 2 weeks straight. States she has been using preparation H cream and has been having some bleeding as well.    Foot Swelling    Pt states she has been having bilateral foot swelling for the last week, states the swelling is constant    HEMORRHOIDS Patient states she usually has problems with Hemorrhoids about once a month.  Recently she has been having issues for 2 weeks straight.  Using preparation H and has been having some bleeding as well.  FOOT SWELLING Patient states she is having some foot swelling.  States it has been constant for the last week. She states she is up walking around outside.  She has not been drinking water or using compression socks.  No chest pain or shortness of breath.   Relevant past medical, surgical, family and social history reviewed and updated as indicated. Interim medical history since our last visit reviewed. Allergies and medications reviewed and updated.  Review of Systems  Respiratory:  Negative for shortness of breath.   Cardiovascular:  Positive for leg swelling. Negative for chest pain.  Gastrointestinal:        Hemorrhoids     Per HPI unless specifically indicated above     Objective:    BP 133/80 (BP Location: Left Arm, Cuff Size: Normal)   Pulse (!) 102   Temp 98.7 F (37.1 C) (Oral)   Wt 166 lb (75.3 kg)   LMP 09/13/1975 (Approximate)   SpO2 95%   BMI 28.49 kg/m   Wt Readings from Last 3 Encounters:  08/12/22 166 lb (75.3 kg)   07/17/22 150 lb 3.2 oz (68.1 kg)  06/17/22 149 lb 14.4 oz (68 kg)    Physical Exam Vitals and nursing note reviewed.  Constitutional:      General: She is not in acute distress.    Appearance: Normal appearance. She is normal weight. She is not ill-appearing, toxic-appearing or diaphoretic.  HENT:     Head: Normocephalic.     Right Ear: External ear normal.     Left Ear: External ear normal.     Nose: Nose normal.     Mouth/Throat:     Mouth: Mucous membranes are moist.     Pharynx: Oropharynx is clear.  Eyes:     General:        Right eye: No discharge.        Left eye: No discharge.     Extraocular Movements: Extraocular movements intact.     Conjunctiva/sclera: Conjunctivae normal.     Pupils: Pupils are equal, round, and reactive to light.  Cardiovascular:     Rate and Rhythm: Normal rate and regular rhythm.     Heart sounds: No murmur heard. Pulmonary:     Effort: Pulmonary effort is normal. No respiratory distress.     Breath sounds: Normal breath sounds. No  wheezing or rales.  Musculoskeletal:     Cervical back: Normal range of motion and neck supple.     Right lower leg: Edema present.     Left lower leg: Edema present.  Skin:    General: Skin is warm and dry.     Capillary Refill: Capillary refill takes less than 2 seconds.  Neurological:     General: No focal deficit present.     Mental Status: She is alert and oriented to person, place, and time. Mental status is at baseline.  Psychiatric:        Mood and Affect: Mood normal.        Behavior: Behavior normal.        Thought Content: Thought content normal.        Judgment: Judgment normal.     Results for orders placed or performed during the hospital encounter of 06/07/22  Comprehensive metabolic panel  Result Value Ref Range   Sodium 143 135 - 145 mmol/L   Potassium 3.7 3.5 - 5.1 mmol/L   Chloride 110 98 - 111 mmol/L   CO2 24 22 - 32 mmol/L   Glucose, Bld 112 (H) 70 - 99 mg/dL   BUN 9 8 - 23 mg/dL    Creatinine, Ser 1.61 (H) 0.44 - 1.00 mg/dL   Calcium 8.8 (L) 8.9 - 10.3 mg/dL   Total Protein 7.2 6.5 - 8.1 g/dL   Albumin 3.7 3.5 - 5.0 g/dL   AST 81 (H) 15 - 41 U/L   ALT 43 0 - 44 U/L   Alkaline Phosphatase 115 38 - 126 U/L   Total Bilirubin 0.4 0.3 - 1.2 mg/dL   GFR, Estimated 57 (L) >60 mL/min   Anion gap 9 5 - 15  Ethanol  Result Value Ref Range   Alcohol, Ethyl (B) 246 (H) <10 mg/dL  Salicylate level  Result Value Ref Range   Salicylate Lvl <7.0 (L) 7.0 - 30.0 mg/dL  Acetaminophen level  Result Value Ref Range   Acetaminophen (Tylenol), Serum 29 10 - 30 ug/mL  cbc  Result Value Ref Range   WBC 7.8 4.0 - 10.5 K/uL   RBC 4.80 3.87 - 5.11 MIL/uL   Hemoglobin 14.9 12.0 - 15.0 g/dL   HCT 09.6 (H) 04.5 - 40.9 %   MCV 96.5 80.0 - 100.0 fL   MCH 31.0 26.0 - 34.0 pg   MCHC 32.2 30.0 - 36.0 g/dL   RDW 81.1 (H) 91.4 - 78.2 %   Platelets 544 (H) 150 - 400 K/uL   nRBC 0.0 0.0 - 0.2 %  Urine Drug Screen, Qualitative  Result Value Ref Range   Tricyclic, Ur Screen NONE DETECTED NONE DETECTED   Amphetamines, Ur Screen NONE DETECTED NONE DETECTED   MDMA (Ecstasy)Ur Screen NONE DETECTED NONE DETECTED   Cocaine Metabolite,Ur [place] NONE DETECTED NONE DETECTED   Opiate, Ur Screen NONE DETECTED NONE DETECTED   Phencyclidine (PCP) Ur S NONE DETECTED NONE DETECTED   Cannabinoid 50 Ng, Ur  NONE DETECTED NONE DETECTED   Barbiturates, Ur Screen NONE DETECTED NONE DETECTED   Benzodiazepine, Ur Scrn NONE DETECTED NONE DETECTED   Methadone Scn, Ur NONE DETECTED NONE DETECTED  Urinalysis, Routine w reflex microscopic -Urine, Clean Catch  Result Value Ref Range   Color, Urine COLORLESS (A) YELLOW   APPearance CLEAR (A) CLEAR   Specific Gravity, Urine 1.003 (L) 1.005 - 1.030   pH 6.0 5.0 - 8.0   Glucose, UA NEGATIVE NEGATIVE mg/dL   Hgb  urine dipstick NEGATIVE NEGATIVE   Bilirubin Urine NEGATIVE NEGATIVE   Ketones, ur NEGATIVE NEGATIVE mg/dL   Protein, ur NEGATIVE NEGATIVE mg/dL    Nitrite NEGATIVE NEGATIVE   Leukocytes,Ua NEGATIVE NEGATIVE  Lipase, blood  Result Value Ref Range   Lipase 136 (H) 11 - 51 U/L  TSH  Result Value Ref Range   TSH 1.634 0.350 - 4.500 uIU/mL  T4, free  Result Value Ref Range   Free T4 0.81 0.61 - 1.12 ng/dL      Assessment & Plan:   Problem List Items Addressed This Visit       Other   Moderate episode of recurrent major depressive disorder (HCC) - Primary    Chronic. Well controlled.  However, patient gained 15lbs with the Seroquel.  Plans to stop taking the medication.  Discussed how to safely stop the medication.  Follow up in 2 weeks. If mood worsens can consider other medication options.       Other Visit Diagnoses     Hemorrhoids, unspecified hemorrhoid type       Will send suppository. Discussed conservative treatments such as sitz bath, tucks pads and increasing water intake to help with bowel movements.   Relevant Medications   furosemide (LASIX) 20 MG tablet   Swelling of left lower extremity       Will send lasix 20mg  x 5 days. Recommend increasing water intake to 64 oz daily, wear compression socks, and increase activity. FU in two weeks.        Follow up plan: Return in about 2 weeks (around 08/26/2022) for Foot Swelling.  A total of 30 minutes were spent on this encounter today.  When total time is documented, this includes both the face-to-face and non-face-to-face time personally spent before, during and after the visit on the date of the encounter discussing symptoms, plan of care, medications and follow up.

## 2022-08-12 NOTE — Assessment & Plan Note (Signed)
Chronic. Well controlled.  However, patient gained 15lbs with the Seroquel.  Plans to stop taking the medication.  Discussed how to safely stop the medication.  Follow up in 2 weeks. If mood worsens can consider other medication options.

## 2022-08-13 ENCOUNTER — Other Ambulatory Visit: Payer: Self-pay | Admitting: Nurse Practitioner

## 2022-08-13 NOTE — Telephone Encounter (Signed)
Requested medication (s) are due for refill today - yes  Requested medication (s) are on the active medication list -yes  Future visit scheduled -yes  Last refill: 10/05/21 #20 6RF  Notes to clinic: non delegated Rx  Requested Prescriptions  Pending Prescriptions Disp Refills   ondansetron (ZOFRAN-ODT) 4 MG disintegrating tablet [Pharmacy Med Name: ONDANSETRON ODT 4 MG TAB 4 Tablet] 20 tablet 6    Sig: DISSOLVE 1 TABLET ON TONGUE EVERY 8 HOURS AS NEEDED FOR NAUSEA OR VOMITING.     Not Delegated - Gastroenterology: Antiemetics - ondansetron Failed - 08/13/2022  9:29 AM      Failed - This refill cannot be delegated      Failed - AST in normal range and within 360 days    AST  Date Value Ref Range Status  06/07/2022 81 (H) 15 - 41 U/L Final   SGOT(AST)  Date Value Ref Range Status  09/14/2013 36 15 - 37 Unit/L Final         Passed - ALT in normal range and within 360 days    ALT  Date Value Ref Range Status  06/07/2022 43 0 - 44 U/L Final   SGPT (ALT)  Date Value Ref Range Status  09/14/2013 45 U/L Final    Comment:    14-63 NOTE: New Reference Range 09/07/13          Passed - Valid encounter within last 6 months    Recent Outpatient Visits           Yesterday Moderate episode of recurrent major depressive disorder [facility])   [place] [facility] Larae Grooms, NP   3 weeks ago Moderate episode of recurrent major depressive disorder [facility])   [place] [facility] Larae Grooms, NP   1 month ago Acute pain of left knee   [place] [facility] Larae Grooms, NP   2 months ago Moderate episode of recurrent major depressive disorder (HCC)   [place] [facility] Larae Grooms, NP   2 months ago Appointment canceled by hospital   [place] [facility] Dorcas Carrow, DO       Future Appointments             In 1 week Mecum, Oswaldo Conroy, PA-C [place] [facility],  PEC   In 2 months Mecum, Oswaldo Conroy, PA-C [place] [facility], [facility]               Requested Prescriptions  Pending Prescriptions Disp Refills   ondansetron (ZOFRAN-ODT) 4 MG disintegrating tablet [Pharmacy Med Name: ONDANSETRON ODT 4 MG TAB 4 Tablet] 20 tablet 6    Sig: DISSOLVE 1 TABLET ON TONGUE EVERY 8 HOURS AS NEEDED FOR NAUSEA OR VOMITING.     Not Delegated - Gastroenterology: Antiemetics - ondansetron Failed - 08/13/2022  9:29 AM      Failed - This refill cannot be delegated      Failed - AST in normal range and within 360 days    AST  Date Value Ref Range Status  06/07/2022 81 (H) 15 - 41 U/L Final   SGOT(AST)  Date Value Ref Range Status  09/14/2013 36 15 - 37 Unit/L Final         Passed - ALT in normal range and within 360 days    ALT  Date Value Ref Range Status  06/07/2022 43 0 - 44 U/L Final   SGPT (ALT)  Date Value  Ref Range Status  09/14/2013 45 U/L Final    Comment:    14-63 NOTE: New Reference Range 09/07/13          Passed - Valid encounter within last 6 months    Recent Outpatient Visits           Yesterday Moderate episode of recurrent major depressive disorder [facility])   [place] [facility] Larae Grooms, NP   3 weeks ago Moderate episode of recurrent major depressive disorder [facility])   [place] [facility] Larae Grooms, NP   1 month ago Acute pain of left knee   [place] [facility] Larae Grooms, NP   2 months ago Moderate episode of recurrent major depressive disorder [facility])   [place] [facility] Larae Grooms, NP   2 months ago Appointment canceled by hospital   [facility] Dorcas Carrow, DO       Future Appointments             In 1 week Mecum, Oswaldo Conroy, PA-C [facility], [facility]   In 2 months Mecum, Oswaldo Conroy, PA-C [place] [facility], [facility]

## 2022-08-15 ENCOUNTER — Telehealth: Payer: Self-pay | Admitting: Nurse Practitioner

## 2022-08-15 NOTE — Telephone Encounter (Signed)
Pt is calling in because she saw Clydie Braun on Friday 06/21 and said she had swelling in her legs so Clydie Braun prescribed her a fluid pill. Pt says the swelling hasn't gone down and has actually gotten worse. Pt says she would like for Karen's nurse to give her a call regarding the next steps she should take. Pt says she doesn't want to come in for an appointment unless it's absolutely necessary. Please advise.

## 2022-08-15 NOTE — Telephone Encounter (Signed)
Called and scheduled patient on 08/19/2022 @ 10:40 am.

## 2022-08-15 NOTE — Telephone Encounter (Signed)
Pt has an appointment already with Amy Rocha on 08/26/2022 @ 3:00 pm, should we get her in earlier as I know she will need an appointment?

## 2022-08-15 NOTE — Telephone Encounter (Signed)
Recommend patient come in early next week for an appt.

## 2022-08-19 ENCOUNTER — Ambulatory Visit (INDEPENDENT_AMBULATORY_CARE_PROVIDER_SITE_OTHER): Payer: 59 | Admitting: Nurse Practitioner

## 2022-08-19 ENCOUNTER — Ambulatory Visit: Payer: 59 | Admitting: Nurse Practitioner

## 2022-08-19 ENCOUNTER — Encounter: Payer: Self-pay | Admitting: Nurse Practitioner

## 2022-08-19 VITALS — BP 175/81 | HR 79 | Temp 98.3°F | Wt 159.6 lb

## 2022-08-19 DIAGNOSIS — M7989 Other specified soft tissue disorders: Secondary | ICD-10-CM | POA: Insufficient documentation

## 2022-08-19 NOTE — Patient Instructions (Signed)
You have an appointment with Dr. Juliann Pares at [facility] tomorrow, 08/20/22 at 10:00 AM. Their address is [address] [place], [place] [address]. Their phone number is [phone].

## 2022-08-19 NOTE — Progress Notes (Signed)
BP (!) 175/81 (BP Location: Right Arm, Cuff Size: Normal)   Pulse 79   Temp 98.3 F (36.8 C) (Oral)   Wt 159 lb 9.6 oz (72.4 kg)   LMP 09/13/1975 (Approximate)   SpO2 97%   BMI 27.40 kg/m    Subjective:    Patient ID: Amy Rocha, female    DOB: Oct 06, 1955, 67 y.o.   MRN: [id number]  HPI: Amy Rocha is a 67 y.o. female  Chief Complaint  Patient presents with   Foot Swelling    Pt states she is still having swelling in both of her feet, states the swelling doubles by the end of the day. States the Furosemide didn't seem to help at all, states she has taken all that was prescribed last week.     FOOT SWELLING Patient states she is having some foot swelling for about 1 month.  She was given lasix at her last appointment which didn't help.  Patient states she has been using compression socks but by the time she takes them off at the end of the evening her legs swell again.  States they usually get double the size by the end of the evening.  She is drinking about 2 bottles of water per day.  Has not seen Cardiology since August 2023.  Denies SOB or chest pain.  Relevant past medical, surgical, family and social history reviewed and updated as indicated. Interim medical history since our last visit reviewed. Allergies and medications reviewed and updated.  Review of Systems  Respiratory:  Negative for shortness of breath.   Cardiovascular:  Positive for leg swelling. Negative for chest pain.    Per HPI unless specifically indicated above     Objective:    BP (!) 175/81 (BP Location: Right Arm, Cuff Size: Normal)   Pulse 79   Temp 98.3 F (36.8 C) (Oral)   Wt 159 lb 9.6 oz (72.4 kg)   LMP 09/13/1975 (Approximate)   SpO2 97%   BMI 27.40 kg/m   Wt Readings from Last 3 Encounters:  08/19/22 159 lb 9.6 oz (72.4 kg)  08/12/22 166 lb (75.3 kg)  07/17/22 150 lb 3.2 oz (68.1 kg)    Physical Exam Vitals and nursing note reviewed.  Constitutional:      General: She  is not in acute distress.    Appearance: Normal appearance. She is normal weight. She is not ill-appearing, toxic-appearing or diaphoretic.  HENT:     Head: Normocephalic.     Right Ear: External ear normal.     Left Ear: External ear normal.     Nose: Nose normal.     Mouth/Throat:     Mouth: Mucous membranes are moist.     Pharynx: Oropharynx is clear.  Eyes:     General:        Right eye: No discharge.        Left eye: No discharge.     Extraocular Movements: Extraocular movements intact.     Conjunctiva/sclera: Conjunctivae normal.     Pupils: Pupils are equal, round, and reactive to light.  Cardiovascular:     Rate and Rhythm: Normal rate and regular rhythm.     Heart sounds: No murmur heard. Pulmonary:     Effort: Pulmonary effort is normal. No respiratory distress.     Breath sounds: Normal breath sounds. No wheezing or rales.  Musculoskeletal:     Cervical back: Normal range of motion and neck supple.     Right lower  leg: 2+ Pitting Edema present.     Left lower leg: 2+ Pitting Edema present.  Skin:    General: Skin is warm and dry.     Capillary Refill: Capillary refill takes less than 2 seconds.  Neurological:     General: No focal deficit present.     Mental Status: She is alert and oriented to person, place, and time. Mental status is at baseline.  Psychiatric:        Mood and Affect: Mood normal.        Behavior: Behavior normal.        Thought Content: Thought content normal.        Judgment: Judgment normal.     Results for orders placed or performed during the hospital encounter of 06/07/22  Comprehensive metabolic panel  Result Value Ref Range   Sodium 143 135 - 145 mmol/L   Potassium 3.7 3.5 - 5.1 mmol/L   Chloride 110 98 - 111 mmol/L   CO2 24 22 - 32 mmol/L   Glucose, Bld 112 (H) 70 - 99 mg/dL   BUN 9 8 - 23 mg/dL   Creatinine, Ser 1.61 (H) 0.44 - 1.00 mg/dL   Calcium 8.8 (L) 8.9 - 10.3 mg/dL   Total Protein 7.2 6.5 - 8.1 g/dL   Albumin 3.7 3.5  - 5.0 g/dL   AST 81 (H) 15 - 41 U/L   ALT 43 0 - 44 U/L   Alkaline Phosphatase 115 38 - 126 U/L   Total Bilirubin 0.4 0.3 - 1.2 mg/dL   GFR, Estimated 57 (L) >60 mL/min   Anion gap 9 5 - 15  Ethanol  Result Value Ref Range   Alcohol, Ethyl (B) 246 (H) <10 mg/dL  Salicylate level  Result Value Ref Range   Salicylate Lvl <7.0 (L) 7.0 - 30.0 mg/dL  Acetaminophen level  Result Value Ref Range   Acetaminophen (Tylenol), Serum 29 10 - 30 ug/mL  cbc  Result Value Ref Range   WBC 7.8 4.0 - 10.5 K/uL   RBC 4.80 3.87 - 5.11 MIL/uL   Hemoglobin 14.9 12.0 - 15.0 g/dL   HCT 09.6 (H) 04.5 - 40.9 %   MCV 96.5 80.0 - 100.0 fL   MCH 31.0 26.0 - 34.0 pg   MCHC 32.2 30.0 - 36.0 g/dL   RDW 81.1 (H) 91.4 - 78.2 %   Platelets 544 (H) 150 - 400 K/uL   nRBC 0.0 0.0 - 0.2 %  Urine Drug Screen, Qualitative  Result Value Ref Range   Tricyclic, Ur Screen NONE DETECTED NONE DETECTED   Amphetamines, Ur Screen NONE DETECTED NONE DETECTED   MDMA (Ecstasy)Ur Screen NONE DETECTED NONE DETECTED   Cocaine Metabolite,Ur [place] NONE DETECTED NONE DETECTED   Opiate, Ur Screen NONE DETECTED NONE DETECTED   Phencyclidine (PCP) Ur S NONE DETECTED NONE DETECTED   Cannabinoid 50 Ng, Ur [place] NONE DETECTED NONE DETECTED   Barbiturates, Ur Screen NONE DETECTED NONE DETECTED   Benzodiazepine, Ur Scrn NONE DETECTED NONE DETECTED   Methadone Scn, Ur NONE DETECTED NONE DETECTED  Urinalysis, Routine w reflex microscopic -Urine, Clean Catch  Result Value Ref Range   Color, Urine COLORLESS (A) YELLOW   APPearance CLEAR (A) CLEAR   Specific Gravity, Urine 1.003 (L) 1.005 - 1.030   pH 6.0 5.0 - 8.0   Glucose, UA NEGATIVE NEGATIVE mg/dL   Hgb urine dipstick NEGATIVE NEGATIVE   Bilirubin Urine NEGATIVE NEGATIVE   Ketones, ur NEGATIVE NEGATIVE mg/dL   Protein,  ur NEGATIVE NEGATIVE mg/dL   Nitrite NEGATIVE NEGATIVE   Leukocytes,Ua NEGATIVE NEGATIVE  Lipase, blood  Result Value Ref Range   Lipase 136 (H) 11 - 51 U/L  TSH   Result Value Ref Range   TSH 1.634 0.350 - 4.500 uIU/mL  T4, free  Result Value Ref Range   Free T4 0.81 0.61 - 1.12 ng/dL      Assessment & Plan:   Problem List Items Addressed This Visit       Other   Swelling of lower extremity - Primary    Bilateral lower extremity x 1 month.  Not improved with lasix.  Continue to use Compression socks, increase water intake.  Appointment made for tomorrow at 10 with Dr. Juliann Pares.         Follow up plan: Return if symptoms worsen or fail to improve.

## 2022-08-19 NOTE — Assessment & Plan Note (Signed)
Bilateral lower extremity x 1 month.  Not improved with lasix.  Continue to use Compression socks, increase water intake.  Appointment made for tomorrow at 10 with Dr. Juliann Pares.

## 2022-08-20 DIAGNOSIS — R0602 Shortness of breath: Secondary | ICD-10-CM | POA: Diagnosis not present

## 2022-08-20 DIAGNOSIS — I70209 Unspecified atherosclerosis of native arteries of extremities, unspecified extremity: Secondary | ICD-10-CM | POA: Diagnosis not present

## 2022-08-20 DIAGNOSIS — I110 Hypertensive heart disease with heart failure: Secondary | ICD-10-CM | POA: Diagnosis not present

## 2022-08-20 DIAGNOSIS — N184 Chronic kidney disease, stage 4 (severe): Secondary | ICD-10-CM | POA: Diagnosis not present

## 2022-08-20 DIAGNOSIS — J45909 Unspecified asthma, uncomplicated: Secondary | ICD-10-CM | POA: Diagnosis not present

## 2022-08-20 DIAGNOSIS — I5032 Chronic diastolic (congestive) heart failure: Secondary | ICD-10-CM | POA: Diagnosis not present

## 2022-08-20 DIAGNOSIS — J449 Chronic obstructive pulmonary disease, unspecified: Secondary | ICD-10-CM | POA: Diagnosis not present

## 2022-08-20 DIAGNOSIS — K219 Gastro-esophageal reflux disease without esophagitis: Secondary | ICD-10-CM | POA: Diagnosis not present

## 2022-08-26 ENCOUNTER — Ambulatory Visit: Payer: 59 | Admitting: Physician Assistant

## 2022-08-28 ENCOUNTER — Ambulatory Visit: Payer: Self-pay | Admitting: *Deleted

## 2022-08-28 DIAGNOSIS — M5136 Other intervertebral disc degeneration, lumbar region: Secondary | ICD-10-CM | POA: Diagnosis not present

## 2022-08-28 DIAGNOSIS — G894 Chronic pain syndrome: Secondary | ICD-10-CM | POA: Diagnosis not present

## 2022-08-28 NOTE — Telephone Encounter (Signed)
  Chief Complaint: Hypertension Symptoms: BP this AM at pain clinic 180/120. States "They gave me a pill and it came down to 185/117. Pt does not check BP at home. Frequency: States has been trending high at MD appts lately. Pertinent Negatives: Patient denies any associated symptoms; denies headache, dizziness, blurred vision, weakness, CP, SOB. No missed doses of meds  Disposition: [] ED /[] Urgent Care (no appt availability in office) / [] Appointment(In office/virtual)/ []  [place] [facility]/ [] Home Care/ [] Refused Recommended Disposition /[] [place] [facility]/ [x]  Follow-up with PCP Additional Notes:  Pt requests "Something stronger be called in for my BP. Pain clinic said if I don't get it under control I can't have my pain meds." Advised would likely need appt, states"I've had 3 doctor appts this week and just there on the 8th for swelling."  Care advise provided, pt verbalizes understanding.  Please advise Reason for Disposition  Systolic BP  >= 180 OR Diastolic >= 110  Answer Assessment - Initial Assessment Questions 1. BLOOD PRESSURE: "What is the blood pressure?" "Did you take at least two measurements 5 minutes apart?"     185/117 at pain clinic this AM 2. ONSET: "When did you take your blood pressure?"     This AM 3. HOW: "How did you take your blood pressure?" (e.g., automatic home BP monitor, visiting nurse)     This AM at pain clinic 4. HISTORY: "Do you have a history of high blood pressure?"     Yes 5. MEDICINES: "Are you taking any medicines for blood pressure?" "Have you missed any doses recently?"     Yes. No missed doses 6. OTHER SYMPTOMS: "Do you have any symptoms?" (e.g., blurred vision, chest pain, difficulty breathing, headache, weakness)    "Upset lately, trying to get off pain meds."  Protocols used: Blood Pressure - High-A-AH

## 2022-08-29 ENCOUNTER — Ambulatory Visit: Payer: 59 | Admitting: Physician Assistant

## 2022-08-29 NOTE — Telephone Encounter (Signed)
Called and scheduled patient for 08/30/2022 @ 2:00 pm.

## 2022-08-30 ENCOUNTER — Ambulatory Visit: Payer: 59 | Admitting: Physician Assistant

## 2022-09-02 ENCOUNTER — Ambulatory Visit: Payer: 59 | Admitting: Physician Assistant

## 2022-09-06 ENCOUNTER — Encounter: Payer: Self-pay | Admitting: Physician Assistant

## 2022-09-06 ENCOUNTER — Ambulatory Visit (INDEPENDENT_AMBULATORY_CARE_PROVIDER_SITE_OTHER): Payer: 59 | Admitting: Physician Assistant

## 2022-09-06 VITALS — BP 136/78 | HR 87 | Ht 64.0 in | Wt 157.2 lb

## 2022-09-06 DIAGNOSIS — J3089 Other allergic rhinitis: Secondary | ICD-10-CM | POA: Diagnosis not present

## 2022-09-06 DIAGNOSIS — I10 Essential (primary) hypertension: Secondary | ICD-10-CM | POA: Diagnosis not present

## 2022-09-06 MED ORDER — RYALTRIS 665-25 MCG/ACT NA SUSP
2.0000 | Freq: Two times a day (BID) | NASAL | 1 refills | Status: AC
Start: 2022-09-06 — End: ?

## 2022-09-06 MED ORDER — AMLODIPINE BESYLATE 2.5 MG PO TABS: 2.5000 mg | ORAL_TABLET | Freq: Every day | ORAL | 0 refills | Status: DC

## 2022-09-06 MED ORDER — METHYLPREDNISOLONE 4 MG PO TBPK
ORAL_TABLET | ORAL | 0 refills | Status: DC
Start: 2022-09-06 — End: 2022-11-28

## 2022-09-06 NOTE — Progress Notes (Unsigned)
Acute Office Visit   Patient: Amy Rocha   DOB: September 13, 1955   67 y.o. Female  MRN: [id number] Visit Date: 09/06/2022  Today's healthcare provider: Oswaldo Conroy Mustapha Colson, PA-C  Introduced myself to the patient as a Secondary school teacher and provided education on APPs in clinical practice.    Chief Complaint  Patient presents with  . Hypertension    Patient says her Blood Pressure has been elevated and says she sees a pain clinic and was informed that if she does not get her blood pressure under control she will be discharge from the facility. Patient says the last time it was checked was about a week ago.    Subjective    HPI HPI     Hypertension    Additional comments: Patient says her Blood Pressure has been elevated and says she sees a pain clinic and was informed that if she does not get her blood pressure under control she will be discharge from the facility. Patient says the last time it was checked was about a week ago.       Last edited by Malen Gauze, CMA on 09/06/2022 10:52 AM.      She reports she has been having increased BP readings  Reviewed previous PCP charts and Cardiology notes from 08/20/22  She states her BP has been elevated at this last few office visits she has been to  She is taking Losartan 25 mg PO every day and does not report concerns or side effects She also has Torsemide 20 mg PO every day PRN from Cardiology for lower extremity edema  She checks her BP when she goes to the pharmacy and usually gets results 160s/100s - she does not check at home though    She states she thinks she has a sinus infection and states she needs Prednisone, states abx does not work for this She states she has had a mild cough that is not productive  She reports it is worse when she first gets up but it gets better throughout the day  She has tried using flushes about once per week, Using Flonase- 2 puffs per nostril twice per day She takes Singulair daily as well     Medications: Outpatient Medications Prior to Visit  Medication Sig  . acetaminophen (TYLENOL) 500 MG tablet Take 1,000 mg by mouth 2 (two) times daily as needed for moderate pain or headache.   . albuterol (VENTOLIN HFA) 108 (90 Base) MCG/ACT inhaler Inhale 2 puffs into the lungs every 6 (six) hours as needed for wheezing or shortness of breath.  Marland Kitchen atorvastatin (LIPITOR) 40 MG tablet TAKE 1 TABLET (40 MG TOTAL) BY MOUTH AT BEDTIME.  . budesonide-formoterol (SYMBICORT) 160-4.5 MCG/ACT inhaler Inhale 2 puffs into the lungs 2 (two) times daily as needed (shortness of breath).  Marland Kitchen buPROPion (WELLBUTRIN XL) 150 MG 24 hr tablet TAKE 1 TABLET (150 MG TOTAL) BY MOUTH DAILY.  Marland Kitchen buPROPion (WELLBUTRIN XL) 300 MG 24 hr tablet Take 1 tablet (300 mg total) by mouth every morning.  . cromolyn (OPTICROM) 4 % ophthalmic solution Place 1 drop into both eyes 4 (four) times daily.  . hydrocortisone (ANUSOL-HC) 25 MG suppository Place 1 suppository (25 mg total) rectally 2 (two) times daily.  Marland Kitchen losartan (COZAAR) 25 MG tablet Take 1 tablet (25 mg total) by mouth daily.  . montelukast (SINGULAIR) 10 MG tablet TAKE 1 TABLET (10 MG TOTAL) BY MOUTH AT BEDTIME.  Marland Kitchen NARCAN 4  MG/0.1ML LIQD nasal spray kit Place 1 spray into the nose once.  Marland Kitchen omeprazole (PRILOSEC) 20 MG capsule TAKE 1 CAPSULE BY MOUTH DAILY.  Marland Kitchen ondansetron (ZOFRAN-ODT) 4 MG disintegrating tablet DISSOLVE 1 TABLET ON TONGUE EVERY 8 HOURS AS NEEDED FOR NAUSEA OR VOMITING.  Marland Kitchen Oxycodone HCl 20 MG TABS Take 1 tablet (20 mg total) by mouth every 4 (four) hours as needed (pain). Takes 5 daily.  . QUEtiapine (SEROQUEL) 25 MG tablet Take 1 tablet (25 mg total) by mouth at bedtime.  Marland Kitchen rOPINIRole (REQUIP) 0.5 MG tablet Take 1 tablet (0.5 mg total) by mouth at bedtime as needed.  Marland Kitchen tiZANidine (ZANAFLEX) 2 MG tablet   . torsemide (DEMADEX) 20 MG tablet Take 20 mg by mouth daily.  . valACYclovir (VALTREX) 500 MG tablet TAKE 4 TABLETS BY MOUTH 2 TIMES DAILY AS NEEDED.   . Vitamin D, Ergocalciferol, (DRISDOL) 1.25 MG (50000 UNIT) CAPS capsule Take 1 capsule (50,000 Units total) by mouth every 7 (seven) days.  Marland Kitchen vortioxetine HBr (TRINTELLIX) 20 MG TABS tablet Take 1 tablet (20 mg total) by mouth daily.  . [DISCONTINUED] fluticasone (FLONASE) 50 MCG/ACT nasal spray Place 2 sprays into both nostrils daily.   No facility-administered medications prior to visit.    Review of Systems  Constitutional:  Negative for chills, fatigue and fever.  HENT:  Positive for congestion, postnasal drip, rhinorrhea, sneezing and voice change. Negative for ear pain, facial swelling, nosebleeds, sinus pressure, sinus pain and sore throat.   Eyes:  Positive for discharge. Negative for redness and visual disturbance.  Respiratory:  Positive for cough. Negative for shortness of breath and wheezing.   Cardiovascular:  Positive for leg swelling. Negative for chest pain and palpitations.  Gastrointestinal:  Negative for diarrhea, nausea and vomiting.  Neurological:  Positive for light-headedness (mild). Negative for dizziness and headaches.    {Insert previous labs (optional):23779}  {See past labs  Heme  Chem  Endocrine  Serology  Results Review (optional):1}   Objective    BP 136/78   Pulse 87   Ht 5\' 4"  (1.626 m)   Wt 157 lb 3.2 oz (71.3 kg)   LMP 09/13/1975 (Approximate)   SpO2 96%   BMI 26.98 kg/m  {Insert last BP/Wt (optional):23777}  {See vitals history (optional):1}  Physical Exam Vitals reviewed.  Constitutional:      General: She is awake.     Appearance: Normal appearance. She is well-developed and well-groomed.  HENT:     Head: Normocephalic and atraumatic.     Nose: Nose normal. No septal deviation, congestion or rhinorrhea.     Right Nostril: No epistaxis.     Left Nostril: No epistaxis.     Right Turbinates: Not enlarged or swollen.     Left Turbinates: Not enlarged or swollen.  Eyes:     General: Lids are normal. Gaze aligned appropriately.      Extraocular Movements: Extraocular movements intact.     Conjunctiva/sclera: Conjunctivae normal.  Cardiovascular:     Rate and Rhythm: Normal rate and regular rhythm.     Pulses: Normal pulses.          Radial pulses are 2+ on the right side and 2+ on the left side.     Heart sounds: Normal heart sounds. No murmur heard.    No friction rub. No gallop.  Pulmonary:     Effort: Pulmonary effort is normal.     Breath sounds: Normal breath sounds.  Musculoskeletal:     Cervical back:  Normal range of motion.     Right lower leg: No edema.     Left lower leg: No edema.  Neurological:     Mental Status: She is alert.  Psychiatric:        Behavior: Behavior is cooperative.      No results found for any visits on 09/06/22.  Assessment & Plan      No follow-ups on file.     Problem List Items Addressed This Visit       Cardiovascular and Mediastinum   Hypertension   Relevant Medications   torsemide (DEMADEX) 20 MG tablet   amLODipine (NORVASC) 2.5 MG tablet     Respiratory   Allergic rhinitis - Primary   Relevant Medications   Olopatadine-Mometasone (RYALTRIS) 665-25 MCG/ACT SUSP   methylPREDNISolone (MEDROL DOSEPAK) 4 MG TBPK tablet     No follow-ups on file.   I, Tidus Upchurch E [place] Cawood, PA-C, have reviewed all documentation for this visit. The documentation on 09/06/22 for the exam, diagnosis, procedures, and orders are all accurate and complete.   Jacquelin Hawking, MHS, PA-C [facility]

## 2022-09-06 NOTE — Patient Instructions (Addendum)
We are concerned about your blood pressure.   If possible please take it at home using an electronic blood pressure cuff for the upper arm Record your blood pressure once per day and bring them back with you to your apt so we can make sure you are not developing high blood pressure.   Incorporating a minimum of 150 minutes (20-30 minutes per day) of moderate intensity physical activity can help improve your heart health and reduce the chances of high blood pressure and other cardiovascular risks. Incorporating a heart healthy diet can also help reduce the chances of heart attack and high cholesterol.  I have added a medication called Amlodipine 2.5 mg to be taken by mouth once per day to help control your blood pressure. Please check your blood pressure daily, if possible, and write down the results so we can review it at your next apt.  If you are still having very high readings after taking the Amlodipine for 2 weeks, please come in sooner.   Please stop the Flonase and try the Ryaltris nasal spray instead I have linked the medication instructions here for you to review and there may be a savings card/program offered by the manufacturer if needed.  [email or web address]

## 2022-09-09 DIAGNOSIS — I5032 Chronic diastolic (congestive) heart failure: Secondary | ICD-10-CM | POA: Diagnosis not present

## 2022-09-09 NOTE — Assessment & Plan Note (Signed)
Acute, recurrent Patient was recently seen by PCP for similar concerns and was treated with steroid along with Flonase. She is also taking Singulair daily She reports continued rhinorrhea along with mild cough Symptoms appear more consistent with allergic rhinitis at this time Patient was adamant that a steroid would be needed to provide relief so Medrol Dosepak was sent in along with prescription for Ryaltris nasal spray Instructions for administration were provided in after visit summary Follow-up as needed for progressing or persistent symptoms

## 2022-09-09 NOTE — Assessment & Plan Note (Signed)
Chronic, historic condition Patient reports recent exacerbations both at home and when measured at healthcare facilities She has been taking losartan 25 mg p.o. daily as long as torsemide 20 mg p.o. daily as needed Given her current concerns we will add amlodipine 2.5 mg p.o. daily to current regimen to provide further control Recommend that she checks her blood pressure at home once daily and record it so that we can review at follow-up Follow-up as needed for persistent elevated blood pressures or further concerns

## 2022-09-25 DIAGNOSIS — J449 Chronic obstructive pulmonary disease, unspecified: Secondary | ICD-10-CM | POA: Diagnosis not present

## 2022-09-25 DIAGNOSIS — N184 Chronic kidney disease, stage 4 (severe): Secondary | ICD-10-CM | POA: Diagnosis not present

## 2022-09-25 DIAGNOSIS — K219 Gastro-esophageal reflux disease without esophagitis: Secondary | ICD-10-CM | POA: Diagnosis not present

## 2022-09-25 DIAGNOSIS — I110 Hypertensive heart disease with heart failure: Secondary | ICD-10-CM | POA: Diagnosis not present

## 2022-09-25 DIAGNOSIS — E782 Mixed hyperlipidemia: Secondary | ICD-10-CM | POA: Diagnosis not present

## 2022-09-25 DIAGNOSIS — I70209 Unspecified atherosclerosis of native arteries of extremities, unspecified extremity: Secondary | ICD-10-CM | POA: Diagnosis not present

## 2022-09-25 DIAGNOSIS — I5032 Chronic diastolic (congestive) heart failure: Secondary | ICD-10-CM | POA: Diagnosis not present

## 2022-09-25 DIAGNOSIS — I10 Essential (primary) hypertension: Secondary | ICD-10-CM | POA: Diagnosis not present

## 2022-09-25 DIAGNOSIS — R0602 Shortness of breath: Secondary | ICD-10-CM | POA: Diagnosis not present

## 2022-10-01 DIAGNOSIS — M5136 Other intervertebral disc degeneration, lumbar region: Secondary | ICD-10-CM | POA: Diagnosis not present

## 2022-10-01 DIAGNOSIS — G894 Chronic pain syndrome: Secondary | ICD-10-CM | POA: Diagnosis not present

## 2022-10-02 ENCOUNTER — Ambulatory Visit: Payer: Self-pay | Admitting: *Deleted

## 2022-10-02 NOTE — Telephone Encounter (Signed)
Summary: positive covid test   Patient called stated she test positive for covid on yesterday and is requesting provider call in Paxlovid for her. She is experiencing fever, sore throat, cold chills, headaches and body aches. Please f/u with patient.           Chief Complaint: positive at home covid test yesterday , requesting medication paxlovid  Symptoms: headache, fever yesterday 101 did not report temp today. Sore throat, cough, cold chills, body aches. Shortness of breath at rest but "not that bad". Hx lung issues reports "some dr say COPD and others say no COPD."  Takes oxycodone for hip pain and reports it does not work for headache. O2 sat 95% HR 109 Frequency: 3 days  Pertinent Negatives: Patient denies chest pain no difficulty breathing  Disposition: [] ED /[] Urgent Care (no appt availability in office) / [] Appointment(In office/virtual)/ []  [facility]/ [] Home Care/ [x] Refused Recommended Disposition /[] [place] [facility]/ []  Follow-up with PCP Additional Notes:   Offered VV appt and patient reports she does not know how to to VV on her phone and reports she cannot come in for OV because she feels too bad.  Please advise if medication can be prescribed. Patient requesting a call back.      Reason for Disposition  [1] HIGH RISK patient (e.g., weak immune system, age > 64 years, obesity with BMI 30 or higher, pregnant, chronic lung disease or other chronic medical condition) AND [2] COVID symptoms (e.g., cough, fever)  (Exceptions: Already seen by PCP and no new or worsening symptoms.)  Answer Assessment - Initial Assessment Questions 1. COVID-19 DIAGNOSIS: "How do you know that you have COVID?" (e.g., positive lab test or self-test, diagnosed by doctor or NP/PA, symptoms after exposure).     At home test positive yesterday  2. COVID-19 EXPOSURE: "Was there any known exposure to COVID before the symptoms began?" CDC Definition of close contact: within 6 feet (2  meters) for a total of 15 minutes or more over a 24-hour period.      Na  3. ONSET: "When did the COVID-19 symptoms start?"      3 days ago  4. WORST SYMPTOM: "What is your worst symptom?" (e.g., cough, fever, shortness of breath, muscle aches)     Headache, cough, sore throat, cold chills, body aches, fever 5. COUGH: "Do you have a cough?" If Yes, ask: "How bad is the cough?"       Yes  6. FEVER: "Do you have a fever?" If Yes, ask: "What is your temperature, how was it measured, and when did it start?"     Yesterday 101 7. RESPIRATORY STATUS: "Describe your breathing?" (e.g., normal; shortness of breath, wheezing, unable to speak)      Shortness of breath  at rest "not  8. BETTER-SAME-WORSE: "Are you getting better, staying the same or getting worse compared to yesterday?"  If getting worse, ask, "In what way?"     Na  9. OTHER SYMPTOMS: "Do you have any other symptoms?"  (e.g., chills, fatigue, headache, loss of smell or taste, muscle pain, sore throat)     Headache , fever cold chills, body aches, sore throat  10. HIGH RISK DISEASE: "Do you have any chronic medical problems?" (e.g., asthma, heart or lung disease, weak immune system, obesity, etc.)       See hx  11. VACCINE: "Have you had the COVID-19 vaccine?" If Yes, ask: "Which one, how many shots, when did you get it?"  Na  12. PREGNANCY: "Is there any chance you are pregnant?" "When was your last menstrual period?"       na 13. O2 SATURATION MONITOR:  "Do you use an oxygen saturation monitor (pulse oximeter) at home?" If Yes, ask "What is your reading (oxygen level) today?" "What is your usual oxygen saturation reading?" (e.g., 95%)       O2 sat at 95% HR 109  Protocols used: Coronavirus (COVID-19) Diagnosed or Suspected-A-AH

## 2022-10-02 NOTE — Telephone Encounter (Signed)
MyChart Video appointment scheduled.

## 2022-10-03 ENCOUNTER — Ambulatory Visit: Payer: Self-pay | Admitting: *Deleted

## 2022-10-03 ENCOUNTER — Telehealth (INDEPENDENT_AMBULATORY_CARE_PROVIDER_SITE_OTHER): Payer: 59 | Admitting: Physician Assistant

## 2022-10-03 ENCOUNTER — Other Ambulatory Visit: Payer: Self-pay

## 2022-10-03 ENCOUNTER — Encounter: Payer: Self-pay | Admitting: Physician Assistant

## 2022-10-03 DIAGNOSIS — I509 Heart failure, unspecified: Secondary | ICD-10-CM | POA: Insufficient documentation

## 2022-10-03 DIAGNOSIS — N189 Chronic kidney disease, unspecified: Secondary | ICD-10-CM | POA: Insufficient documentation

## 2022-10-03 DIAGNOSIS — N179 Acute kidney failure, unspecified: Secondary | ICD-10-CM | POA: Insufficient documentation

## 2022-10-03 DIAGNOSIS — K648 Other hemorrhoids: Secondary | ICD-10-CM | POA: Diagnosis not present

## 2022-10-03 DIAGNOSIS — J449 Chronic obstructive pulmonary disease, unspecified: Secondary | ICD-10-CM | POA: Diagnosis not present

## 2022-10-03 DIAGNOSIS — I130 Hypertensive heart and chronic kidney disease with heart failure and stage 1 through stage 4 chronic kidney disease, or unspecified chronic kidney disease: Secondary | ICD-10-CM | POA: Diagnosis not present

## 2022-10-03 DIAGNOSIS — U071 COVID-19: Secondary | ICD-10-CM

## 2022-10-03 LAB — CBC
HCT: 38.3 % (ref 36.0–46.0)
Hemoglobin: 12.7 g/dL (ref 12.0–15.0)
MCH: 30.7 pg (ref 26.0–34.0)
MCHC: 33.2 g/dL (ref 30.0–36.0)
MCV: 92.5 fL (ref 80.0–100.0)
Platelets: 340 10*3/uL (ref 150–400)
RBC: 4.14 MIL/uL (ref 3.87–5.11)
RDW: 13.5 % (ref 11.5–15.5)
WBC: 13.1 10*3/uL — ABNORMAL HIGH (ref 4.0–10.5)
nRBC: 0 % (ref 0.0–0.2)

## 2022-10-03 LAB — BASIC METABOLIC PANEL
Anion gap: 11 (ref 5–15)
BUN: 26 mg/dL — ABNORMAL HIGH (ref 8–23)
CO2: 17 mmol/L — ABNORMAL LOW (ref 22–32)
Calcium: 8.7 mg/dL — ABNORMAL LOW (ref 8.9–10.3)
Chloride: 101 mmol/L (ref 98–111)
Creatinine, Ser: 1.72 mg/dL — ABNORMAL HIGH (ref 0.44–1.00)
GFR, Estimated: 32 mL/min — ABNORMAL LOW (ref >60)
Glucose, Bld: 119 mg/dL — ABNORMAL HIGH (ref 70–99)
Potassium: 4.5 mmol/L (ref 3.5–5.1)
Sodium: 129 mmol/L — ABNORMAL LOW (ref 135–145)

## 2022-10-03 NOTE — Telephone Encounter (Signed)
Summary: Hemorrids w/ blood and pain Advice   Pt called reporting Hemorrhoids with blood and pain. Attempted to reach NT. Advised to place a clinical call. When returned back to the line. Pt hung up. Please advise     Chief Complaint: Rectal Bleeding Symptoms: Reports large hemorrhoid,"Size a little less than a plum."  Pt states saturated 5 pads so far today, bright red with clots. Bleeding without having BMs. States unable to void, left sided abdominal pain. Dizziness Frequency: 1 week,  worsening Pertinent Negatives: Patient denies  Disposition: [x] ED /[] Urgent Care (no appt availability in office) / [] Appointment(In office/virtual)/ []  [place] [facility]/ [] Home Care/ [] Refused Recommended Disposition /[] [place] [facility]/ []  Follow-up with PCP Additional Notes: Advised ED, states will follow disposition.  Reason for Disposition  Patient sounds very sick or weak to the triager  Answer Assessment - Initial Assessment Questions 1. APPEARANCE of BLOOD: "What color is it?" "Is it passed separately, on the surface of the stool, or mixed in with the stool?"      Bright red with clots 2. AMOUNT: "How much blood was passed?"      Wearing pad, changing 5 times today 3. FREQUENCY: "How many times has blood been passed with the stools?"      No BM 4. ONSET: "When was the blood first seen in the stools?" (Days or weeks)      1 week 5. DIARRHEA: "Is there also some diarrhea?" If Yes, ask: "How many diarrhea stools in the past 24 hours?"      no 6. CONSTIPATION: "Do you have constipation?" If Yes, ask: "How bad is it?"     No stools "Can't go, hemorrhoid out." 7. RECURRENT SYMPTOMS: "Have you had blood in your stools before?" If Yes, ask: "When was the last time?" and "What happened that time?"  Yes, hemorrhoids.   8. BLOOD THINNERS: "Do you take any blood thinners?" (e.g., Coumadin/warfarin, Pradaxa/dabigatran, aspirin)     No 9. OTHER SYMPTOMS: "Do you have any other symptoms?"   (e.g., abdomen pain, vomiting, dizziness, fever)     Left side abdomen.  Protocols used: Rectal Bleeding-A-AH

## 2022-10-03 NOTE — ED Triage Notes (Addendum)
Pt to ED via POV from home. Pt reports has hemorrhoids that have started bleeding x5 days and spoke with primary care and they told her to come the ER. Pt also reports decreased urination. Pt reports also taking oxycodone for hip pain.

## 2022-10-04 ENCOUNTER — Emergency Department
Admission: EM | Admit: 2022-10-04 | Discharge: 2022-10-04 | Disposition: A | Discharge status: Home / Self Care | Payer: 59 | Source: Home / Self Care | Attending: Emergency Medicine | Admitting: Emergency Medicine

## 2022-10-04 DIAGNOSIS — I130 Hypertensive heart and chronic kidney disease with heart failure and stage 1 through stage 4 chronic kidney disease, or unspecified chronic kidney disease: Secondary | ICD-10-CM

## 2022-10-04 DIAGNOSIS — K648 Other hemorrhoids: Secondary | ICD-10-CM | POA: Diagnosis not present

## 2022-10-04 DIAGNOSIS — N179 Acute kidney failure, unspecified: Secondary | ICD-10-CM

## 2022-10-04 MED ORDER — ACETAMINOPHEN 500 MG PO TABS
1000.0000 mg | ORAL_TABLET | Freq: Once | ORAL | Status: AC
  Administered 2022-10-04: 1000 mg via ORAL
  Filled 2022-10-04: qty 2

## 2022-10-04 MED ORDER — HYDROCORTISONE ACETATE 25 MG RE SUPP
25.0000 mg | Freq: Once | RECTAL | Status: AC
  Administered 2022-10-04: 25 mg via RECTAL
  Filled 2022-10-04: qty 1

## 2022-10-04 MED ORDER — FUROSEMIDE 10 MG/ML IJ SOLN
40.0000 mg | Freq: Once | INTRAMUSCULAR | Status: AC
  Administered 2022-10-04: 40 mg via INTRAVENOUS
  Filled 2022-10-04: qty 4

## 2022-10-04 MED ORDER — OXYCODONE HCL 5 MG PO TABS
5.0000 mg | ORAL_TABLET | Freq: Once | ORAL | Status: AC
  Administered 2022-10-04: 5 mg via ORAL
  Filled 2022-10-04: qty 1

## 2022-10-04 MED ORDER — LACTATED RINGERS IV BOLUS
1000.0000 mL | Freq: Once | INTRAVENOUS | Status: AC
  Administered 2022-10-04: 1000 mL via INTRAVENOUS

## 2022-10-04 NOTE — Discharge Instructions (Addendum)
As we discussed, please follow-up with your PCP to recheck your kidney numbers/creatinine.  That number was elevated in the ED up to 1.7, where it is typically lower (which is better) at about 1.0.  This could be because of the extra fluid you are holding  Keep your bottom clean and dry to help with the hemorrhoids.  Preparation H internal cream, Tucks pad on the outside.

## 2022-10-04 NOTE — Telephone Encounter (Signed)
She was seen in ED and will likely need follow up for labs

## 2022-10-04 NOTE — ED Provider Notes (Signed)
[facility] Provider Note    None    (approximate)   History   Hemorrhoids   HPI  Amy Rocha is a 67 y.o. female who presents to the ED for evaluation of Hemorrhoids   I reviewed cardiology clinic visit from 9 days ago.  History of diastolic dysfunction, HTN, GERD, COPD, CKD.  Previous trach from postoperative ARDS.  Baseline creatinine to be around 1.0 -1.1  Patient presents to the ED for evaluation of anorectal pain in the setting of known hemorrhoids.  She reports chronically dealing with hemorrhoids, but the pain seems more severe today.  She reports bleeding external and painful hemorrhoids.  Intermittently using Preparation H or Tucks pads at home.  Further reports that she has diuretics at home, torsemide, to use on an as-needed basis and acknowledges "I should probably have you some this week" because she is more swollen her lower extremities.  Further reports decreased urinary output without dysuria, hematuria.   Denies shortness of breath, reports that she was dyspneic last week when she had COVID but this is improved.  No fevers, cough or chest pain.   Physical Exam   Triage Vital Signs: ED Triage Vitals  Encounter Vitals Group     BP 10/03/22 1827 135/76     Systolic BP Percentile --      Diastolic BP Percentile --      Pulse Rate 10/03/22 1827 (!) 106     Resp 10/03/22 1827 18     Temp 10/03/22 1829 98 F (36.7 C)     Temp Source 10/03/22 1829 Oral     SpO2 10/03/22 1827 93 %     Weight 10/03/22 1827 140 lb (63.5 kg)     Height 10/03/22 1827 5\' 4"  (1.626 m)     Head Circumference --      Peak Flow --      Pain Score 10/03/22 1830 6     Pain Loc --      Pain Education --      Exclude from Growth Chart --     Most recent vital signs: Vitals:   10/03/22 1829 10/04/22 0058  BP:  136/81  Pulse:  (!) 101  Resp:  18  Temp: 98 F (36.7 C) 98.2 F (36.8 C)  SpO2:  94%    General: Awake, no distress.  Ambulatory,  conversational in full sentences and looks quite well. CV:  Good peripheral perfusion.  Resp:  Normal effort.  Abd:  No distention.  Soft and benign anteriorly MSK:  No deformity noted.  Trace pitting edema to bilateral lower extremities Neuro:  No focal deficits appreciated. Other:  Chaperoned external anal exam with small, grade 1 external hemorrhoids without thrombosis.  No active bleeding.   ED Results / Procedures / Treatments   Labs (all labs ordered are listed, but only abnormal results are displayed) Labs Reviewed  CBC - Abnormal; Notable for the following components:      Result Value   WBC 13.1 (*)    All other components within normal limits  BASIC METABOLIC PANEL - Abnormal; Notable for the following components:   Sodium 129 (*)    CO2 17 (*)    Glucose, Bld 119 (*)    BUN 26 (*)    Creatinine, Ser 1.72 (*)    Calcium 8.7 (*)    GFR, Estimated 32 (*)    All other components within normal limits    EKG   RADIOLOGY  Official radiology report(s): No results found.  PROCEDURES and INTERVENTIONS:  Procedures  Medications  acetaminophen (TYLENOL) tablet 1,000 mg (has no administration in time range)  oxyCODONE (Oxy IR/ROXICODONE) immediate release tablet 5 mg (has no administration in time range)  lactated ringers bolus 1,000 mL (0 mLs Intravenous Stopped 10/04/22 0250)  furosemide (LASIX) injection 40 mg (40 mg Intravenous Given 10/04/22 0259)  hydrocortisone (ANUSOL-HC) suppository 25 mg (25 mg Rectal Given 10/04/22 0319)     IMPRESSION / MDM / ASSESSMENT AND PLAN / ED COURSE  I reviewed the triage vital signs and the nursing notes.  Differential diagnosis includes, but is not limited to, hemorrhoids, thrombosed hemorrhoids, dehydration, CHF exacerbation  {Patient presents with symptoms of an acute illness or injury that is potentially life-threatening.  Patient presents with symptomatic hemorrhoids, found to have elevated creatinine that I suspect is a  degree of cardiorenal syndrome in the setting of her volume overload status on exam.  Hemorrhoids are nonthrombosed and we discussed supportive measures for this.  Minimal leukocytosis is noted but no cough to suggest pneumonia, fevers and I doubt infectious etiology of her symptoms.  Metabolic derangements are noted with elevated creatinine and mild non-anion gap metabolic acidosis.  Considering her lower extremity edema and decreased urinary output I suspect cardiorenal syndrome primarily.  She does receive 200 cc of lactated Ringer's through triage prior to my evaluation and exam, but this is stopped and she does not receive the full liter.  Subsequently received a dose of IV Lasix and we discussed using her home torsemide as prescribed.  While I considered observation admission for this patient, she looks great and is asking to go home and I think that is reasonable.  We discussed close outpatient follow-up, hemorrhoid management and return precautions  Clinical Course as of 10/04/22 0408  Fri Oct 04, 2022  0354 Reassessed, feeling well and requesting discharge.  We again discussed PCP follow-up for creatinine recheck. [DS]    Clinical Course User Index [DS] Delton Prairie, MD     FINAL CLINICAL IMPRESSION(S) / ED DIAGNOSES   Final diagnoses:  Other hemorrhoids  AKI (acute kidney injury) (HCC)  Cardiorenal syndrome with renal failure, stage 1-4 or unspecified chronic kidney disease, with heart failure (HCC)     Rx / DC Orders   ED Discharge Orders     None        Note:  This document was prepared using Dragon voice recognition software and may include unintentional dictation errors.   Delton Prairie, MD 10/04/22 [phone]

## 2022-10-07 ENCOUNTER — Ambulatory Visit: Payer: 59 | Admitting: Family Medicine

## 2022-10-08 ENCOUNTER — Telehealth: Payer: Self-pay | Admitting: *Deleted

## 2022-10-08 ENCOUNTER — Telehealth: Payer: Self-pay

## 2022-10-08 NOTE — Transitions of Care (Post Inpatient/ED Visit) (Signed)
10/08/2022  Name: Amy Rocha MRN: [id number] DOB: 09/05/1955  Today's TOC FU Call Status: Today's TOC FU Call Status:: Successful TOC FU Call Completed TOC FU Call Complete Date: 10/08/22  Transition Care Management Follow-up Telephone Call Date of Discharge: 10/05/22 Discharge Facility: [facility]) Type of Discharge: Emergency Department Reason for ED Visit: Other: (hemorrhoids) How have you been since you were released from the hospital?: Better Any questions or concerns?: No  Items Reviewed: Did you receive and understand the discharge instructions provided?: Yes Medications obtained,verified, and reconciled?: Yes (Medications Reviewed) Any new allergies since your discharge?: No Dietary orders reviewed?: No Do you have support at home?: Yes People in Home: spouse Name of Support/Comfort Primary Source: ronald  Medications Reviewed Today: Medications Reviewed Today     Reviewed by Luella Cook, RN (Case Manager) on 10/08/22 at 1544  Med List Status: <None>   Medication Order Taking? Sig Documenting Provider Last Dose Status Informant  acetaminophen (TYLENOL) 500 MG tablet [id number] Yes Take 1,000 mg by mouth 2 (two) times daily as needed for moderate pain or headache.  [provider] Taking Active Self  albuterol (VENTOLIN HFA) 108 (90 Base) MCG/ACT inhaler [id number] Yes Inhale 2 puffs into the lungs every 6 (six) hours as needed for wheezing or shortness of breath. Larae Grooms, NP Taking Active   amLODipine (NORVASC) 2.5 MG tablet [id number] Yes Take 1 tablet (2.5 mg total) by mouth daily. Mecum, Oswaldo Conroy, PA-C Taking Active   atorvastatin (LIPITOR) 40 MG tablet [id number] Yes TAKE 1 TABLET (40 MG TOTAL) BY MOUTH AT BEDTIME. Larae Grooms, NP Taking Active   budesonide-formoterol [facility]) 160-4.5 MCG/ACT inhaler [id number] Yes Inhale 2 puffs into the lungs 2 (two) times daily as needed (shortness of breath). Larae Grooms, NP Taking Active   buPROPion (WELLBUTRIN XL) 150 MG 24 hr tablet [id number] Yes TAKE 1 TABLET (150 MG TOTAL) BY MOUTH DAILY. Larae Grooms, NP Taking Active   buPROPion (WELLBUTRIN XL) 300 MG 24 hr tablet [id number] Yes Take 1 tablet (300 mg total) by mouth every morning. Larae Grooms, NP Taking Active   cromolyn (OPTICROM) 4 % ophthalmic solution [id number] Yes Place 1 drop into both eyes 4 (four) times daily. Particia Nearing, [place] Taking Active Self  hydrocortisone (ANUSOL-HC) 25 MG suppository [id number] Yes Place 1 suppository (25 mg total) rectally 2 (two) times daily. Larae Grooms, NP Taking Active   losartan (COZAAR) 25 MG tablet [id number] Yes Take 1 tablet (25 mg total) by mouth daily. Larae Grooms, NP Taking Active   methylPREDNISolone (MEDROL DOSEPAK) 4 MG TBPK tablet [id number] Yes Please follow dosing instructions on box. Mecum, Oswaldo Conroy, PA-C Taking Active   montelukast (SINGULAIR) 10 MG tablet [id number]  TAKE 1 TABLET (10 MG TOTAL) BY MOUTH AT BEDTIME. Larae Grooms, NP  Active   NARCAN 4 MG/0.1ML LIQD nasal spray kit [id number] Yes Place 1 spray into the nose once. [provider] Taking Active            Med Note Rebeca Alert Jun 24, 2019 10:44 AM) Has kit if ever needed.  Olopatadine-Mometasone (RYALTRIS) [id number] MCG/ACT SUSP [id number] Yes Place 2 puffs into the nose in the morning and at bedtime. Mecum, Oswaldo Conroy, PA-C Taking Active   omeprazole (PRILOSEC) 20 MG capsule [id number] Yes TAKE 1 CAPSULE BY MOUTH DAILY. Larae Grooms, NP Taking Active   ondansetron (ZOFRAN-ODT) 4 MG disintegrating tablet [id number] Yes DISSOLVE 1 TABLET ON TONGUE EVERY 8 HOURS  AS NEEDED FOR NAUSEA OR VOMITING. Larae Grooms, NP Taking Active   Oxycodone HCl 20 MG TABS [id number] Yes Take 1 tablet (20 mg total) by mouth every 4 (four) hours as needed (pain). Takes 5 daily. Larae Grooms, NP Taking Active   QUEtiapine (SEROQUEL) 25 MG tablet [id number] Yes  Take 1 tablet (25 mg total) by mouth at bedtime. Larae Grooms, NP Taking Active   rOPINIRole (REQUIP) 0.5 MG tablet [id number] Yes Take 1 tablet (0.5 mg total) by mouth at bedtime as needed. Dorcas Carrow, DO Taking Active Self  tiZANidine (ZANAFLEX) 2 MG tablet [id number] Yes  [provider] Taking Active   torsemide (DEMADEX) 20 MG tablet [id number] Yes Take 20 mg by mouth daily. [provider] Taking Active   valACYclovir (VALTREX) 500 MG tablet [id number] Yes TAKE 4 TABLETS BY MOUTH 2 TIMES DAILY AS NEEDED. Larae Grooms, NP Taking Active   Vitamin D, Ergocalciferol, (DRISDOL) 1.25 MG (50000 UNIT) CAPS capsule [id number]  Take 1 capsule (50,000 Units total) by mouth every 7 (seven) days. Larae Grooms, NP  Active   vortioxetine HBr (TRINTELLIX) 20 MG TABS tablet [id number] Yes Take 1 tablet (20 mg total) by mouth daily. Larae Grooms, NP Taking Active   Med List Note [place], Darden Dates, RN 10/30/15 1108): CS 12/05/2014 meds to last until 12/15/15  Oxycodone approved  From 06/11/15 to 07/10/16  Oxymorphone approved from [employer] [facility] spoke tp Madolyn Frieze  Med approved from 07/18/15 through 6/29/17n. hensley rn uds 04-19-15             Home Care and Equipment/Supplies: Were Home Health Services Ordered?: NA Any new equipment or medical supplies ordered?: NA  Functional Questionnaire: Do you need assistance with bathing/showering or dressing?: No Do you need assistance with meal preparation?: No Do you need assistance with eating?: No Do you have difficulty maintaining continence: No Do you need assistance with getting out of bed/getting out of a chair/moving?: No Do you have difficulty managing or taking your medications?: No  Follow up appointments reviewed: PCP Follow-up appointment confirmed?: Yes Date of PCP follow-up appointment?: 10/17/22 Follow-up Provider: [facility] Follow-up appointment confirmed?: NA Do you need  transportation to your follow-up appointment?: No Do you understand care options if your condition(s) worsen?: Yes-patient verbalized understanding  SDOH Interventions Today    Flowsheet Row Most Recent Value  SDOH Interventions   Food Insecurity Interventions Intervention Not Indicated  Housing Interventions Intervention Not Indicated  Transportation Interventions Intervention Not Indicated      Interventions Today    Flowsheet Row Most Recent Value  General Interventions   General Interventions Discussed/Reviewed General Interventions Discussed, General Interventions Reviewed, Doctor Visits  Pharmacy Interventions   Pharmacy Dicussed/Reviewed Pharmacy Topics Discussed      TOC Interventions Today    Flowsheet Row Most Recent Value  TOC Interventions   TOC Interventions Discussed/Reviewed TOC Interventions Discussed, TOC Interventions Reviewed        Gean Maidens BSN RN [facility] [phone]

## 2022-10-08 NOTE — Transitions of Care (Post Inpatient/ED Visit) (Unsigned)
   10/08/2022  Name: Amy Rocha MRN: [id number] DOB: 13-Sep-1955  Today's TOC FU Call Status: Today's TOC FU Call Status:: Unsuccessful Call (1st Attempt) Unsuccessful Call (1st Attempt) Date: 10/08/22  Attempted to reach the patient regarding the most recent Inpatient/ED visit.  Follow Up Plan: Additional outreach attempts will be made to reach the patient to complete the Transitions of Care (Post Inpatient/ED visit) call.   Signature: Wilhemena Durie, CMA

## 2022-10-09 NOTE — Telephone Encounter (Signed)
See other phone encounter, TOC call was completed.

## 2022-10-14 ENCOUNTER — Other Ambulatory Visit: Payer: Self-pay | Admitting: Nurse Practitioner

## 2022-10-15 NOTE — Telephone Encounter (Signed)
Requested medications are due for refill today.  unsure  Requested medications are on the active medications list.  yes  Last refill. 04/22/2022 #90 0 rf  Future visit scheduled.   yes  Notes to clinic.   Pt is prescribed both 150 mg and 300 mg. Please review for refill.    Requested Prescriptions  Pending Prescriptions Disp Refills   buPROPion (WELLBUTRIN XL) 150 MG 24 hr tablet [Pharmacy Med Name: BUPROPION HCL XL 150 MG TAB 150 Tablet] 90 tablet 0    Sig: TAKE 1 TABLET (150 MG TOTAL) BY MOUTH DAILY.     Psychiatry: Antidepressants - bupropion Failed - 10/14/2022 11:13 AM      Failed - Cr in normal range and within 360 days    Creatinine  Date Value Ref Range Status  09/14/2013 0.88 0.60 - 1.30 mg/dL Final   Creatinine, Ser  Date Value Ref Range Status  10/03/2022 1.72 (H) 0.44 - 1.00 mg/dL Final         Failed - AST in normal range and within 360 days    AST  Date Value Ref Range Status  06/07/2022 81 (H) 15 - 41 U/L Final   SGOT(AST)  Date Value Ref Range Status  09/14/2013 36 15 - 37 Unit/L Final         Passed - ALT in normal range and within 360 days    ALT  Date Value Ref Range Status  06/07/2022 43 0 - 44 U/L Final   SGPT (ALT)  Date Value Ref Range Status  09/14/2013 45 U/L Final    Comment:    14-63 NOTE: New Reference Range 09/07/13          Passed - Completed PHQ-2 or PHQ-9 in the last 360 days      Passed - Last BP in normal range    BP Readings from Last 1 Encounters:  10/04/22 138/63         Passed - Valid encounter within last 6 months    Recent Outpatient Visits           1 week ago    [place] [facility] Mecum, Oswaldo Conroy, PA-C   1 month ago Non-seasonal allergic rhinitis due to other allergic trigger   [place] [facility] Mecum, Oswaldo Conroy, PA-C   1 month ago Swelling of lower extremity   [place] [facility], Clydie Braun, NP   2 months ago Moderate episode of recurrent major  depressive disorder [facility])   [place] [facility] Larae Grooms, NP   3 months ago Moderate episode of recurrent major depressive disorder [facility])   [place] [facility] Larae Grooms, NP       Future Appointments             In 2 days Mecum, Oswaldo Conroy, PA-C [place] [facility], [facility]

## 2022-10-17 ENCOUNTER — Ambulatory Visit: Payer: 59 | Admitting: Physician Assistant

## 2022-10-29 DIAGNOSIS — Z79891 Long term (current) use of opiate analgesic: Secondary | ICD-10-CM | POA: Diagnosis not present

## 2022-10-29 DIAGNOSIS — G894 Chronic pain syndrome: Secondary | ICD-10-CM | POA: Diagnosis not present

## 2022-10-29 DIAGNOSIS — M5136 Other intervertebral disc degeneration, lumbar region: Secondary | ICD-10-CM | POA: Diagnosis not present

## 2022-11-05 NOTE — Progress Notes (Signed)
Patient cancelled apt with office

## 2022-11-06 DIAGNOSIS — S0085XA Superficial foreign body of other part of head, initial encounter: Secondary | ICD-10-CM | POA: Diagnosis not present

## 2022-11-11 ENCOUNTER — Other Ambulatory Visit: Payer: Self-pay | Admitting: Physician Assistant

## 2022-11-11 DIAGNOSIS — I10 Essential (primary) hypertension: Secondary | ICD-10-CM

## 2022-11-12 NOTE — Telephone Encounter (Signed)
Requested Prescriptions  Pending Prescriptions Disp Refills   amLODipine (NORVASC) 2.5 MG tablet [Pharmacy Med Name: AMLODIPINE BESYLATE 2.5 MG 2.5 Tablet] 60 tablet 0    Sig: TAKE 1 TABLET (2.5 MG TOTAL) BY MOUTH DAILY.     Cardiovascular: Calcium Channel Blockers 2 Passed - 11/11/2022  6:35 PM      Passed - Last BP in normal range    BP Readings from Last 1 Encounters:  10/04/22 138/63         Passed - Last Heart Rate in normal range    Pulse Readings from Last 1 Encounters:  10/04/22 (!) 104         Passed - Valid encounter within last 6 months    Recent Outpatient Visits           1 month ago COVID   [place] [employer], Oswaldo Conroy, PA-C   2 months ago Non-seasonal allergic rhinitis due to other allergic trigger   [place] [facility] Mecum, Oswaldo Conroy, PA-C   2 months ago Swelling of lower extremity   [place] [facility] [place], Clydie Braun, NP   3 months ago Moderate episode of recurrent major depressive disorder [facility])   [place] [facility] Larae Grooms, NP   3 months ago Moderate episode of recurrent major depressive disorder [facility])   [place] [facility] Larae Grooms, NP       Future Appointments             In 2 weeks Larae Grooms, NP [place] [facility], [facility]

## 2022-11-19 ENCOUNTER — Other Ambulatory Visit: Payer: Self-pay | Admitting: Nurse Practitioner

## 2022-11-20 ENCOUNTER — Telehealth: Payer: Self-pay | Admitting: Nurse Practitioner

## 2022-11-20 NOTE — Telephone Encounter (Signed)
Requested Prescriptions  Pending Prescriptions Disp Refills   atorvastatin (LIPITOR) 40 MG tablet [Pharmacy Med Name: ATORVASTATIN 40 MG TAB 40 Tablet] 90 tablet 1    Sig: TAKE 1 TABLET (40 MG TOTAL) BY MOUTH AT BEDTIME.     Cardiovascular:  Antilipid - Statins Failed - 11/19/2022  1:30 PM      Failed - Lipid Panel in normal range within the last 12 months    Cholesterol, Total  Date Value Ref Range Status  05/21/2022 190 100 - 199 mg/dL Final   LDL Chol Calc (NIH)  Date Value Ref Range Status  05/21/2022 62 0 - 99 mg/dL Final   HDL  Date Value Ref Range Status  05/21/2022 114 >39 mg/dL Final   Triglycerides  Date Value Ref Range Status  05/21/2022 80 0 - 149 mg/dL Final         Passed - Patient is not pregnant      Passed - Valid encounter within last 12 months    Recent Outpatient Visits           1 month ago COVID   [place] [facility] Mecum, Erin E, PA-C   2 months ago Non-seasonal allergic rhinitis due to other allergic trigger   [place] [facility] Mecum, Erin E, PA-C   3 months ago Swelling of lower extremity   [place] [facility], Clydie Braun, NP   3 months ago Moderate episode of recurrent major depressive disorder [facility])   [place] [facility] Larae Grooms, NP   4 months ago Moderate episode of recurrent major depressive disorder [facility])   [place] [facility] Larae Grooms, NP       Future Appointments             In 1 week Larae Grooms, NP [place] [facility]

## 2022-11-26 DIAGNOSIS — G894 Chronic pain syndrome: Secondary | ICD-10-CM | POA: Diagnosis not present

## 2022-11-26 DIAGNOSIS — M5136 Other intervertebral disc degeneration, lumbar region with discogenic back pain only: Secondary | ICD-10-CM | POA: Diagnosis not present

## 2022-11-28 ENCOUNTER — Telehealth: Payer: Self-pay

## 2022-11-28 ENCOUNTER — Encounter: Payer: Self-pay | Admitting: Nurse Practitioner

## 2022-11-28 ENCOUNTER — Ambulatory Visit (INDEPENDENT_AMBULATORY_CARE_PROVIDER_SITE_OTHER): Payer: 59 | Admitting: Nurse Practitioner

## 2022-11-28 ENCOUNTER — Other Ambulatory Visit: Payer: Self-pay

## 2022-11-28 VITALS — BP 127/81 | HR 85 | Temp 97.4°F | Wt 148.4 lb

## 2022-11-28 DIAGNOSIS — R7309 Other abnormal glucose: Secondary | ICD-10-CM

## 2022-11-28 DIAGNOSIS — N184 Chronic kidney disease, stage 4 (severe): Secondary | ICD-10-CM | POA: Diagnosis not present

## 2022-11-28 DIAGNOSIS — D649 Anemia, unspecified: Secondary | ICD-10-CM | POA: Diagnosis not present

## 2022-11-28 DIAGNOSIS — E782 Mixed hyperlipidemia: Secondary | ICD-10-CM

## 2022-11-28 DIAGNOSIS — E559 Vitamin D deficiency, unspecified: Secondary | ICD-10-CM

## 2022-11-28 DIAGNOSIS — I70209 Unspecified atherosclerosis of native arteries of extremities, unspecified extremity: Secondary | ICD-10-CM | POA: Diagnosis not present

## 2022-11-28 DIAGNOSIS — J449 Chronic obstructive pulmonary disease, unspecified: Secondary | ICD-10-CM | POA: Diagnosis not present

## 2022-11-28 DIAGNOSIS — I10 Essential (primary) hypertension: Secondary | ICD-10-CM

## 2022-11-28 DIAGNOSIS — I509 Heart failure, unspecified: Secondary | ICD-10-CM

## 2022-11-28 DIAGNOSIS — Z23 Encounter for immunization: Secondary | ICD-10-CM | POA: Diagnosis not present

## 2022-11-28 DIAGNOSIS — Z1231 Encounter for screening mammogram for malignant neoplasm of breast: Secondary | ICD-10-CM

## 2022-11-28 DIAGNOSIS — F331 Major depressive disorder, recurrent, moderate: Secondary | ICD-10-CM

## 2022-11-28 MED ORDER — QUETIAPINE FUMARATE 50 MG PO TABS: 50.0000 mg | ORAL_TABLET | Freq: Every day | ORAL | 1 refills | Status: DC

## 2022-11-28 MED ORDER — BUPROPION HCL ER (XL) 300 MG PO TB24: 300.0000 mg | ORAL_TABLET | Freq: Every morning | ORAL | 1 refills | Status: DC

## 2022-11-28 MED ORDER — AMLODIPINE BESYLATE 2.5 MG PO TABS: 2.5000 mg | ORAL_TABLET | Freq: Every day | ORAL | 1 refills | Status: DC

## 2022-11-28 MED ORDER — LOSARTAN POTASSIUM 25 MG PO TABS: 25.0000 mg | ORAL_TABLET | Freq: Every day | ORAL | 1 refills | Status: DC

## 2022-11-28 MED ORDER — BUPROPION HCL ER (XL) 150 MG PO TB24: 150.0000 mg | ORAL_TABLET | Freq: Every day | ORAL | 1 refills | Status: DC

## 2022-11-28 NOTE — Assessment & Plan Note (Signed)
Labs ordered at visit today.  Will make recommendations based on lab results.   

## 2022-11-28 NOTE — Assessment & Plan Note (Signed)
Chronic.  Followed by pain management.  Patient is concerned that her pain management will shut down and she will be without medication.  Discussed that we will work to get her in with another pain management if this happens.

## 2022-11-28 NOTE — Telephone Encounter (Signed)
-----   Message from Amy Rocha sent at 11/28/2022  1:36 PM EDT ----- Can we call and schedule her mammogram and dexa scan?

## 2022-11-28 NOTE — Telephone Encounter (Signed)
Patient was notified and made aware of her scheduled appointment for 02/18/23 for her Mammogram and DEXA scan. Patient verbalized understanding and has no further questions.

## 2022-11-28 NOTE — Assessment & Plan Note (Signed)
Chronic.  Controlled.  Continue with current medication regimen of Symbicort.  Patient denies concerns regarding breathing at visit today.  Labs ordered today.  Return to clinic in 3 months for reevaluation.  Can consider spirometry in the future if symptoms worsen.  Call sooner if concerns arise.

## 2022-11-28 NOTE — Assessment & Plan Note (Signed)
Chronic.  Controlled.  Continue with current medication regimen of Losartan '25mg'$  daily.  Labs ordered today.  Return to clinic in 3 months for reevaluation.  Call sooner if concerns arise.

## 2022-11-28 NOTE — Progress Notes (Signed)
BP 127/81   Pulse 85   Temp (!) 97.4 F (36.3 C) (Oral)   Wt 148 lb 6.4 oz (67.3 kg)   LMP 09/13/1975 (Approximate)   SpO2 94%   BMI 25.47 kg/m    Subjective:    Patient ID: Amy Rocha, female    DOB: 1955-04-17, 67 y.o.   MRN: [id number]  HPI: Amy Rocha is a 67 y.o. female  Chief Complaint  Patient presents with   Hypertension   Hyperlipidemia   Patient states she likes to go fishing and got a fishing hook stuck under her eye.    HYPERTENSION / HYPERLIPIDEMIA Doing well with medications.  Denies concerns at visit today.  Satisfied with current treatment? yes Duration of hypertension: years BP monitoring frequency: not checking BP range:  BP medication side effects: no Past BP meds: losartan (cozaar) and Amlodipine Duration of hyperlipidemia: years Cholesterol medication side effects: no Cholesterol supplements: none Past cholesterol medications: atorvastain (lipitor) Medication compliance: excellent compliance Aspirin: no Recent stressors: no Recurrent headaches: no Visual changes: no Palpitations: no Dyspnea: no Chest pain: no Lower extremity edema: no Dizzy/lightheaded: no  COPD COPD status: controlled Satisfied with current treatment?: yes Oxygen use: no Dyspnea frequency: occasional Cough frequency:  Rescue inhaler frequency:   Limitation of activity: yes Productive cough:  Last Spirometry:  Pneumovax: Up to Date Influenza: Up to Date   CHRONIC KIDNEY DISEASE CKD status: controlled Medications renally dose: yes Previous renal evaluation: no Pneumovax:  Up to Date Influenza Vaccine:  Up to Date  DEPRESSION Patient states she feels like she doesn't have any energy.  She doesn't want to get up and do anything. She does feel like the seroquel helps and is okay with increasing the dose.    Mood status: improved Satisfied with current treatment?: no Symptom severity: moderate Duration of current treatment : years Side effects:  no Medication compliance: excellent compliance Psychotherapy/counseling: no none Previous psychiatric medications: abilify, effexor, and wellbutrin Tritellinx. Effexor made patient gain weight. She is not sure why she stopped the Abilify. Depressed mood: yes Anxious mood: yes Anhedonia: yes Significant weight loss or gain: no Insomnia: no NA Fatigue: yes Feelings of worthlessness or guilt: yes Impaired concentration/indecisiveness: no Suicidal ideations: no Hopelessness: yes Crying spells: yes    07/17/2022    1:19 PM 06/18/2022   10:34 AM 06/17/2022   10:14 AM 06/10/2022   11:25 AM 05/21/2022    2:15 PM  Depression screen PHQ 2/9  Decreased Interest 1 1 1 3 1   Down, Depressed, Hopeless 1 1 1 3  0  PHQ - 2 Score 2 2 2 6 1   Altered sleeping 1 2 2  0 1  Tired, decreased energy 1 3 3 2 1   Change in appetite 0 1 0 2 1  Feeling bad or failure about yourself  0 0 0 3 0  Trouble concentrating 0 0 0 3 0  Moving slowly or fidgety/restless 0 0 0 0 0  Suicidal thoughts 0 0 0 0 0  PHQ-9 Score 4 8 7 16 4   Difficult doing work/chores Not difficult at all  Not difficult at all Not difficult at all Not difficult at all   Relevant past medical, surgical, family and social history reviewed and updated as indicated. Interim medical history since our last visit reviewed. Allergies and medications reviewed and updated.  Review of Systems  Eyes:  Negative for visual disturbance.  Respiratory:  Negative for cough, chest tightness and shortness of breath.   Cardiovascular:  Negative for chest pain, palpitations and leg swelling.  Musculoskeletal:  Positive for arthralgias.  Neurological:  Negative for dizziness and headaches.  Psychiatric/Behavioral:  Positive for dysphoric mood. Negative for suicidal ideas.     Per HPI unless specifically indicated above     Objective:    BP 127/81   Pulse 85   Temp (!) 97.4 F (36.3 C) (Oral)   Wt 148 lb 6.4 oz (67.3 kg)   LMP 09/13/1975 (Approximate)    SpO2 94%   BMI 25.47 kg/m   Wt Readings from Last 3 Encounters:  11/28/22 148 lb 6.4 oz (67.3 kg)  10/03/22 140 lb (63.5 kg)  09/06/22 157 lb 3.2 oz (71.3 kg)    Physical Exam Vitals and nursing note reviewed.  Constitutional:      General: She is not in acute distress.    Appearance: Normal appearance. She is normal weight. She is not ill-appearing, toxic-appearing or diaphoretic.  HENT:     Head: Normocephalic.     Right Ear: External ear normal.     Left Ear: External ear normal.     Nose: Nose normal.     Mouth/Throat:     Mouth: Mucous membranes are moist.     Pharynx: Oropharynx is clear.  Eyes:     General:        Right eye: No discharge.        Left eye: No discharge.     Extraocular Movements: Extraocular movements intact.     Conjunctiva/sclera: Conjunctivae normal.     Pupils: Pupils are equal, round, and reactive to light.  Cardiovascular:     Rate and Rhythm: Normal rate and regular rhythm.     Heart sounds: No murmur heard. Pulmonary:     Effort: Pulmonary effort is normal. No respiratory distress.     Breath sounds: Normal breath sounds. No wheezing or rales.  Musculoskeletal:     Cervical back: Normal range of motion and neck supple.  Skin:    General: Skin is warm and dry.     Capillary Refill: Capillary refill takes less than 2 seconds.  Neurological:     General: No focal deficit present.     Mental Status: She is alert and oriented to person, place, and time. Mental status is at baseline.  Psychiatric:        Mood and Affect: Mood normal.        Behavior: Behavior normal.        Thought Content: Thought content normal.        Judgment: Judgment normal.     Results for orders placed or performed during the hospital encounter of 10/04/22  CBC  Result Value Ref Range   WBC 13.1 (H) 4.0 - 10.5 K/uL   RBC 4.14 3.87 - 5.11 MIL/uL   Hemoglobin 12.7 12.0 - 15.0 g/dL   HCT 40.9 81.1 - 91.4 %   MCV 92.5 80.0 - 100.0 fL   MCH 30.7 26.0 - 34.0 pg    MCHC 33.2 30.0 - 36.0 g/dL   RDW 78.2 95.6 - 21.3 %   Platelets 340 150 - 400 K/uL   nRBC 0.0 0.0 - 0.2 %  Basic metabolic panel  Result Value Ref Range   Sodium 129 (L) 135 - 145 mmol/L   Potassium 4.5 3.5 - 5.1 mmol/L   Chloride 101 98 - 111 mmol/L   CO2 17 (L) 22 - 32 mmol/L   Glucose, Bld 119 (H) 70 - 99 mg/dL  BUN 26 (H) 8 - 23 mg/dL   Creatinine, Ser 5.78 (H) 0.44 - 1.00 mg/dL   Calcium 8.7 (L) 8.9 - 10.3 mg/dL   GFR, Estimated 32 (L) >60 mL/min   Anion gap 11 5 - 15      Assessment & Plan:   Problem List Items Addressed This Visit       Cardiovascular and Mediastinum   CHF (congestive heart failure) (HCC) - Primary    Chronic.  Controlled.  Continue with current medication regimen.  Labs ordered today. Does not see Cardiology.  Declines referral at this time.  Return to clinic in 3 months for reevaluation.  Call sooner if concerns arise.        Relevant Medications   amLODipine (NORVASC) 2.5 MG tablet   losartan (COZAAR) 25 MG tablet   Atherosclerotic peripheral vascular disease (HCC)    Chronic. On Atorvastatin, continue with current medication regimen. Labs ordered today. Will make recommendations based on lab results.  Follow up in 6 months. Call sooner if concerns arise.       Relevant Medications   amLODipine (NORVASC) 2.5 MG tablet   losartan (COZAAR) 25 MG tablet   Hypertension    Chronic.  Controlled.  Continue with current medication regimen of Losartan 25mg  daily.  Labs ordered today.  Return to clinic in 3 months for reevaluation.  Call sooner if concerns arise.       Relevant Medications   amLODipine (NORVASC) 2.5 MG tablet   losartan (COZAAR) 25 MG tablet   Other Relevant Orders   Comp Met (CMET)     Respiratory   COPD (chronic obstructive pulmonary disease) (HCC)    Chronic.  Controlled.  Continue with current medication regimen of Symbicort.  Patient denies concerns regarding breathing at visit today.  Labs ordered today.  Return to clinic in  3 months for reevaluation.  Can consider spirometry in the future if symptoms worsen.  Call sooner if concerns arise.          Genitourinary   CKD (chronic kidney disease), stage IV (HCC)    Chronic.  Controlled.  Continue with current medication regimen.  Avoid NSAID use.  Can consider addition of SGLT2 to help with kidney function.  Labs ordered today.  Return to clinic in 3 months for reevaluation.  Call sooner if concerns arise.          Other   Anemia    Labs ordered at visit today.  Will make recommendations based on lab results.  Likely due to CKD.       Relevant Orders   CBC w/Diff   Hyperlipidemia    Chronic.  Labs ordered today.  Continue with Atorvastatin 40mg  daily.  Will make recommendations based on lab results. Follow up in 6 months.  Call sooner if concerns arise.       Relevant Medications   amLODipine (NORVASC) 2.5 MG tablet   losartan (COZAAR) 25 MG tablet   Other Relevant Orders   Lipid Profile   Vitamin D deficiency    Labs ordered at visit today.  Will make recommendations based on lab results.        Relevant Orders   Vitamin D (25 hydroxy)   Moderate episode of recurrent major depressive disorder (HCC)    Chronic. Ongoing concern.  Will increase Seroquel to 50mg  daily.  Follow up in 3 months.  Call sooner if concerns arise.       Relevant Medications   buPROPion (WELLBUTRIN XL) 300  MG 24 hr tablet   buPROPion (WELLBUTRIN XL) 150 MG 24 hr tablet   Other Visit Diagnoses     Elevated glucose       Relevant Orders   HgB A1c   Need for influenza vaccination       Relevant Orders   Flu Vaccine Trivalent High Dose (Fluad) (Completed)   Need for COVID-19 vaccine       Relevant Orders   Pfizer Comirnaty Covid -19 Vaccine 46yrs and older (Completed)        Follow up plan: Return in about 3 months (around 02/28/2023) for HTN, HLD, DM2 FU.

## 2022-11-28 NOTE — Assessment & Plan Note (Signed)
Dexa scan ordered.  Discussed importance of scan due to fractures.

## 2022-11-28 NOTE — Assessment & Plan Note (Signed)
Labs ordered at visit today.  Will make recommendations based on lab results.  Likely due to CKD.

## 2022-11-28 NOTE — Assessment & Plan Note (Signed)
Chronic.  Controlled.  Continue with current medication regimen.  Avoid NSAID use.  Can consider addition of SGLT2 to help with kidney function.  Labs ordered today.  Return to clinic in 3 months for reevaluation.  Call sooner if concerns arise.

## 2022-11-28 NOTE — Assessment & Plan Note (Signed)
Chronic.  Controlled.  Continue with current medication regimen.  Labs ordered today. Does not see Cardiology.  Declines referral at this time.  Return to clinic in 3 months for reevaluation.  Call sooner if concerns arise.

## 2022-11-28 NOTE — Assessment & Plan Note (Signed)
Chronic. Ongoing concern.  Will increase Seroquel to 50mg  daily.  Follow up in 3 months.  Call sooner if concerns arise.

## 2022-11-28 NOTE — Assessment & Plan Note (Signed)
Chronic.  Labs ordered today.  Continue with Atorvastatin 40mg  daily.  Will make recommendations based on lab results. Follow up in 6 months.  Call sooner if concerns arise.

## 2022-11-28 NOTE — Assessment & Plan Note (Signed)
Chronic. On Atorvastatin, continue with current medication regimen. Labs ordered today. Will make recommendations based on lab results.  Follow up in 6 months. Call sooner if concerns arise.

## 2022-11-29 LAB — CBC WITH DIFFERENTIAL/PLATELET
Basophils Absolute: 0.1 10*3/uL (ref 0.0–0.2)
Basos: 2 %
EOS (ABSOLUTE): 0.4 10*3/uL (ref 0.0–0.4)
Eos: 5 %
Hematocrit: 40.3 % (ref 34.0–46.6)
Hemoglobin: 13.7 g/dL (ref 11.1–15.9)
Immature Grans (Abs): 0 10*3/uL (ref 0.0–0.1)
Immature Granulocytes: 1 %
Lymphocytes Absolute: 2 10*3/uL (ref 0.7–3.1)
Lymphs: 26 %
MCH: 31.4 pg (ref 26.6–33.0)
MCHC: 34 g/dL (ref 31.5–35.7)
MCV: 92 fL (ref 79–97)
Monocytes Absolute: 0.9 10*3/uL (ref 0.1–0.9)
Monocytes: 12 %
Neutrophils Absolute: 4.1 10*3/uL (ref 1.4–7.0)
Neutrophils: 54 %
Platelets: 322 10*3/uL (ref 150–450)
RBC: 4.36 x10E6/uL (ref 3.77–5.28)
RDW: 14 % (ref 11.7–15.4)
WBC: 7.6 10*3/uL (ref 3.4–10.8)

## 2022-11-29 LAB — LIPID PANEL
Chol/HDL Ratio: 2.3 {ratio} (ref 0.0–4.4)
Cholesterol, Total: 171 mg/dL (ref 100–199)
HDL: 76 mg/dL (ref >39)
LDL Chol Calc (NIH): 75 mg/dL (ref 0–99)
Triglycerides: 117 mg/dL (ref 0–149)
VLDL Cholesterol Cal: 20 mg/dL (ref 5–40)

## 2022-11-29 LAB — COMPREHENSIVE METABOLIC PANEL
ALT: 33 [IU]/L — ABNORMAL HIGH (ref 0–32)
AST: 48 [IU]/L — ABNORMAL HIGH (ref 0–40)
Albumin: 3.9 g/dL (ref 3.9–4.9)
Alkaline Phosphatase: 127 [IU]/L — ABNORMAL HIGH (ref 44–121)
BUN/Creatinine Ratio: 11 — ABNORMAL LOW (ref 12–28)
BUN: 15 mg/dL (ref 8–27)
Bilirubin Total: 0.3 mg/dL (ref 0.0–1.2)
CO2: 20 mmol/L (ref 20–29)
Calcium: 9.1 mg/dL (ref 8.7–10.3)
Chloride: 102 mmol/L (ref 96–106)
Creatinine, Ser: 1.35 mg/dL — ABNORMAL HIGH (ref 0.57–1.00)
Globulin, Total: 2.8 g/dL (ref 1.5–4.5)
Glucose: 87 mg/dL (ref 70–99)
Potassium: 4.6 mmol/L (ref 3.5–5.2)
Sodium: 138 mmol/L (ref 134–144)
Total Protein: 6.7 g/dL (ref 6.0–8.5)
eGFR: 43 mL/min/{1.73_m2} — ABNORMAL LOW (ref >59)

## 2022-11-29 LAB — HEMOGLOBIN A1C
Est. average glucose Bld gHb Est-mCnc: 117 mg/dL
Hgb A1c MFr Bld: 5.7 % — ABNORMAL HIGH (ref 4.8–5.6)

## 2022-11-29 LAB — VITAMIN D 25 HYDROXY (VIT D DEFICIENCY, FRACTURES): Vit D, 25-Hydroxy: 28 ng/mL — ABNORMAL LOW (ref 30.0–100.0)

## 2022-11-29 NOTE — Progress Notes (Signed)
HI [name]. It was nice to see you yesterday.  Your lab work looks good.  Your A1c shows that you are prediabetic.  I recommend a low carb diet.  Your vitamin D is on the low side, I recommend taking Vitamin D 1000 international units  once daily to help with this.  Otherwise, your lab work is stable.  No concerns at this time. Continue with your current medication regimen.  Follow up as discussed.  Please let me know if you have any questions.

## 2022-12-24 DIAGNOSIS — M169 Osteoarthritis of hip, unspecified: Secondary | ICD-10-CM | POA: Diagnosis not present

## 2022-12-24 DIAGNOSIS — M25559 Pain in unspecified hip: Secondary | ICD-10-CM | POA: Diagnosis not present

## 2022-12-24 DIAGNOSIS — M5135 Other intervertebral disc degeneration, thoracolumbar region: Secondary | ICD-10-CM | POA: Diagnosis not present

## 2022-12-24 DIAGNOSIS — M25569 Pain in unspecified knee: Secondary | ICD-10-CM | POA: Diagnosis not present

## 2023-01-06 ENCOUNTER — Other Ambulatory Visit: Payer: Self-pay | Admitting: Nurse Practitioner

## 2023-01-06 DIAGNOSIS — I10 Essential (primary) hypertension: Secondary | ICD-10-CM

## 2023-01-07 ENCOUNTER — Ambulatory Visit: Payer: Self-pay

## 2023-01-07 NOTE — Telephone Encounter (Signed)
Requested Prescriptions  Pending Prescriptions Disp Refills   omeprazole (PRILOSEC) 20 MG capsule [Pharmacy Med Name: OMEPRAZOLE 20 MG CPDR 20 Capsule] 90 capsule 3    Sig: TAKE 1 CAPSULE BY MOUTH DAILY.     Gastroenterology: Proton Pump Inhibitors Passed - 01/06/2023  1:11 PM      Passed - Valid encounter within last 12 months    Recent Outpatient Visits           1 month ago Chronic congestive heart failure, unspecified heart failure type [facility])   [place] [facility] Larae Grooms, NP   3 months ago COVID   [place] [employer], Oswaldo Conroy, PA-C   4 months ago Non-seasonal allergic rhinitis due to other allergic trigger   [place] [facility] Mecum, Oswaldo Conroy, PA-C   4 months ago Swelling of lower extremity   [place] [facility] Larae Grooms, NP   4 months ago Moderate episode of recurrent major depressive disorder [facility])   [place] [facility] Larae Grooms, NP       Future Appointments             In 2 days [place], Sherran Needs, NP [place] [facility]   In 1 month Larae Grooms, NP [place] [facility], [facility]             amLODipine (NORVASC) 2.5 MG tablet [Pharmacy Med Name: AMLODIPINE BESYLATE 2.5 MG 2.5 Tablet] 60 tablet 0    Sig: TAKE 1 TABLET (2.5 MG TOTAL) BY MOUTH DAILY.     Cardiovascular: Calcium Channel Blockers 2 Passed - 01/06/2023  1:11 PM      Passed - Last BP in normal range    BP Readings from Last 1 Encounters:  11/28/22 127/81         Passed - Last Heart Rate in normal range    Pulse Readings from Last 1 Encounters:  11/28/22 85         Passed - Valid encounter within last 6 months    Recent Outpatient Visits           1 month ago Chronic congestive heart failure, unspecified heart failure type [facility])   [place] [facility] Larae Grooms, NP   3 months ago COVID   [place] [employer], Oswaldo Conroy, PA-C   4 months ago Non-seasonal allergic rhinitis due to other allergic trigger   [place] [facility] Mecum, Oswaldo Conroy, PA-C   4 months ago Swelling of lower extremity   [place] [facility] Larae Grooms, NP   4 months ago Moderate episode of recurrent major depressive disorder [facility])   [place] [facility] Larae Grooms, NP       Future Appointments             In 2 days Pearley, Sherran Needs, NP [place] [facility]   In 1 month Larae Grooms, NP [place] [facility], [facility]

## 2023-01-07 NOTE — Telephone Encounter (Signed)
     Chief Complaint: Larey Seat 1 week ago on steps. Injured right knee, bruised, painful. Symptoms: Above Frequency: 1 week ago Pertinent Negatives: Patient denies  Disposition: [] ED /[] Urgent Care (no appt availability in office) / [x] Appointment(In office/virtual)/ []  [place] [facility]/ [] Home Care/ [] Refused Recommended Disposition /[] [place] [facility]/ []  Follow-up with PCP Additional Notes: Pt. Agrees with appointment.  Reason for Disposition  MILD weakness (i.e., does not interfere with ability to work, go to school, normal activities)  (Exception: Mild weakness is a chronic symptom.)  Answer Assessment - Initial Assessment Questions 1. MECHANISM: "How did the fall happen?"     Tripped 2. DOMESTIC VIOLENCE AND ELDER ABUSE SCREENING: "Did you fall because someone pushed you or tried to hurt you?" If Yes, ask: "Are you safe now?"     No 3. ONSET: "When did the fall happen?" (e.g., minutes, hours, or days ago)     1 week ago 4. LOCATION: "What part of the body hit the ground?" (e.g., back, buttocks, head, hips, knees, hands, head, stomach)     Right knee 5. INJURY: "Did you hurt (injure) yourself when you fell?" If Yes, ask: "What did you injure? Tell me more about this?" (e.g., body area; type of injury; pain severity)"     Leg is swollen 6. PAIN: "Is there any pain?" If Yes, ask: "How bad is the pain?" (e.g., Scale 1-10; or mild,  moderate, severe)   - NONE (0): No pain   - MILD (1-3): Doesn't interfere with normal activities    - MODERATE (4-7): Interferes with normal activities or awakens from sleep    - SEVERE (8-10): Excruciating pain, unable to do any normal activities      7 7. SIZE: For cuts, bruises, or swelling, ask: "How large is it?" (e.g., inches or centimeters)      Scrape  8. PREGNANCY: "Is there any chance you are pregnant?" "When was your last menstrual period?"     No 9. OTHER SYMPTOMS: "Do you have any other symptoms?" (e.g., dizziness, fever,  weakness; new onset or worsening).      Bruised  10. CAUSE: "What do you think caused the fall (or falling)?" (e.g., tripped, dizzy spell)       Tripped  Protocols used: Falls and [facility]

## 2023-01-09 ENCOUNTER — Ambulatory Visit (INDEPENDENT_AMBULATORY_CARE_PROVIDER_SITE_OTHER): Payer: 59 | Admitting: Family Medicine

## 2023-01-09 VITALS — BP 160/98 | HR 90 | Temp 98.2°F | Ht 64.57 in | Wt 154.2 lb

## 2023-01-09 DIAGNOSIS — L03115 Cellulitis of right lower limb: Secondary | ICD-10-CM | POA: Diagnosis not present

## 2023-01-09 DIAGNOSIS — F5101 Primary insomnia: Secondary | ICD-10-CM

## 2023-01-09 MED ORDER — SULFAMETHOXAZOLE-TRIMETHOPRIM 800-160 MG PO TABS: 1.0000 | ORAL_TABLET | Freq: Two times a day (BID) | ORAL | 0 refills | Status: AC

## 2023-01-09 MED ORDER — QUETIAPINE FUMARATE ER 50 MG PO TB24: 50.0000 mg | ORAL_TABLET | Freq: Every day | ORAL | 0 refills | Status: DC

## 2023-01-09 NOTE — Progress Notes (Signed)
BP (!) 160/98   Pulse 90   Temp 98.2 F (36.8 C) (Oral)   Ht 5' 4.57" (1.64 m)   Wt 154 lb 3.2 oz (69.9 kg)   LMP 09/13/1975 (Approximate)   SpO2 94%   BMI 26.01 kg/m    Subjective:    Patient ID: Amy Rocha, female    DOB: 1955-12-08, 66 y.o.   MRN: [id number]  HPI: Amy Rocha is a 67 y.o. female  Chief Complaint  Patient presents with   Knee Pain   She has elevated BP today, she did not take BP medication today. She is taking Amlodipine 2.5 MG and losartan 25 MG. Instructed to take BP meds in the morning, start checking BP at home, and bring readings in at next visit.   KNEE PAIN She admits to falling two weeks ago, swelling started after fall. Fell on both knees, left knee has scratch, right knee has swelling with cyst like appearance.  Duration:2 weeks Involved knee: right Mechanism of injury: trauma fell on steps Location:anterior Onset: sudden Severity: 5/10  Quality:  throbbing Frequency: constant Radiation: no Aggravating factors: weight bearing and walking  Alleviating factors:  Tylenol   Status: better and stable Treatments attempted:  Tylenol helped a little 500 MG   Relief with NSAIDs?:  No NSAIDs Taken Weakness with weight bearing or walking: no Sensation of giving way: no Locking: no Popping: no Bruising: yes Swelling: yes Redness: yes Paresthesias/decreased sensation: yes numbness Fevers: no   INSOMNIA Currently taking Seroquel 50 MG immediate release, she feels this is not working as well as it used to. She admits to getting 5 hours of sleep nightly.  Duration: chronic Satisfied with sleep quality: no Difficulty falling asleep: yes Difficulty staying asleep: yes Waking a few hours after sleep onset: yes Early morning awakenings: yes Daytime hypersomnolence: yes Wakes feeling refreshed: yes Good sleep hygiene: yes Apnea: no Snoring: no Depressed/anxious mood: yes Recent stress: yes Restless legs/nocturnal leg cramps:  no Chronic pain/arthritis: yes History of sleep study: yes Treatments attempted:  Seroquel     Relevant past medical, surgical, family and social history reviewed and updated as indicated. Interim medical history since our last visit reviewed. Allergies and medications reviewed and updated.  Review of Systems  Constitutional:  Negative for chills and fever.  Respiratory: Negative.    Cardiovascular: Negative.   Genitourinary:  Negative for dysuria, flank pain and frequency.  Musculoskeletal:  Positive for arthralgias (Knee pain). Negative for back pain, gait problem, joint swelling, myalgias, neck pain and neck stiffness.  Skin:  Positive for color change and wound.       Erythema and edema below right knee  Neurological:  Negative for numbness.    Per HPI unless specifically indicated above     Objective:    BP (!) 160/98   Pulse 90   Temp 98.2 F (36.8 C) (Oral)   Ht 5' 4.57" (1.64 m)   Wt 154 lb 3.2 oz (69.9 kg)   LMP 09/13/1975 (Approximate)   SpO2 94%   BMI 26.01 kg/m   Wt Readings from Last 3 Encounters:  01/09/23 154 lb 3.2 oz (69.9 kg)  11/28/22 148 lb 6.4 oz (67.3 kg)  10/03/22 140 lb (63.5 kg)    Physical Exam Vitals and nursing note reviewed.  Constitutional:      General: She is awake. She is not in acute distress.    Appearance: Normal appearance. She is well-developed and well-groomed. She is obese. She is not  ill-appearing.    HENT:     Head: Normocephalic and atraumatic.     Right Ear: Hearing and external ear normal. No drainage.     Left Ear: Hearing and external ear normal. No drainage.     Nose: Nose normal.  Eyes:     General: Lids are normal.        Right eye: No discharge.        Left eye: No discharge.     Conjunctiva/sclera: Conjunctivae normal.  Cardiovascular:     Rate and Rhythm: Normal rate and regular rhythm.     Pulses:          Radial pulses are 2+ on the right side and 2+ on the left side.       Popliteal pulses are 2+ on  the right side and 2+ on the left side.       Posterior tibial pulses are 2+ on the right side and 2+ on the left side.     Heart sounds: Normal heart sounds, S1 normal and S2 normal. No murmur heard.    No gallop.  Pulmonary:     Effort: Pulmonary effort is normal. No accessory muscle usage or respiratory distress.     Breath sounds: Normal breath sounds. No wheezing, rhonchi or rales.  Musculoskeletal:        General: Normal range of motion.     Cervical back: Full passive range of motion without pain and normal range of motion.     Right knee: Erythema present. No bony tenderness. Normal range of motion.     Left knee: No erythema or bony tenderness. Normal range of motion.     Right lower leg: Tenderness present. No bony tenderness. 2+ Edema present.     Left lower leg: No tenderness or bony tenderness. No edema.  Skin:    General: Skin is warm and dry.     Capillary Refill: Capillary refill takes less than 2 seconds.  Neurological:     Mental Status: She is alert and oriented to person, place, and time.  Psychiatric:        Attention and Perception: Attention normal.        Mood and Affect: Mood normal.        Speech: Speech normal.        Behavior: Behavior normal. Behavior is cooperative.        Thought Content: Thought content normal.     Results for orders placed or performed in visit on 11/28/22  Comp Met (CMET)  Result Value Ref Range   Glucose 87 70 - 99 mg/dL   BUN 15 8 - 27 mg/dL   Creatinine, Ser 4.09 (H) 0.57 - 1.00 mg/dL   eGFR 43 (L) >81 XB/JYN/8.29   BUN/Creatinine Ratio 11 (L) 12 - 28   Sodium 138 134 - 144 mmol/L   Potassium 4.6 3.5 - 5.2 mmol/L   Chloride 102 96 - 106 mmol/L   CO2 20 20 - 29 mmol/L   Calcium 9.1 8.7 - 10.3 mg/dL   Total Protein 6.7 6.0 - 8.5 g/dL   Albumin 3.9 3.9 - 4.9 g/dL   Globulin, Total 2.8 1.5 - 4.5 g/dL   Bilirubin Total 0.3 0.0 - 1.2 mg/dL   Alkaline Phosphatase 127 (H) 44 - 121 IU/L   AST 48 (H) 0 - 40 IU/L   ALT 33 (H) 0  - 32 IU/L  HgB A1c  Result Value Ref Range   Hgb A1c MFr Bld  5.7 (H) 4.8 - 5.6 %   Est. average glucose Bld gHb Est-mCnc 117 mg/dL  Vitamin D (25 hydroxy)  Result Value Ref Range   Vit D, 25-Hydroxy 28.0 (L) 30.0 - 100.0 ng/mL  CBC w/Diff  Result Value Ref Range   WBC 7.6 3.4 - 10.8 x10E3/uL   RBC 4.36 3.77 - 5.28 x10E6/uL   Hemoglobin 13.7 11.1 - 15.9 g/dL   Hematocrit 16.1 09.6 - 46.6 %   MCV 92 79 - 97 fL   MCH 31.4 26.6 - 33.0 pg   MCHC 34.0 31.5 - 35.7 g/dL   RDW 04.5 40.9 - 81.1 %   Platelets 322 150 - 450 x10E3/uL   Neutrophils 54 Not Estab. %   Lymphs 26 Not Estab. %   Monocytes 12 Not Estab. %   Eos 5 Not Estab. %   Basos 2 Not Estab. %   Neutrophils Absolute 4.1 1.4 - 7.0 x10E3/uL   Lymphocytes Absolute 2.0 0.7 - 3.1 x10E3/uL   Monocytes Absolute 0.9 0.1 - 0.9 x10E3/uL   EOS (ABSOLUTE) 0.4 0.0 - 0.4 x10E3/uL   Basophils Absolute 0.1 0.0 - 0.2 x10E3/uL   Immature Granulocytes 1 Not Estab. %   Immature Grans (Abs) 0.0 0.0 - 0.1 x10E3/uL  Lipid Profile  Result Value Ref Range   Cholesterol, Total 171 100 - 199 mg/dL   Triglycerides 914 0 - 149 mg/dL   HDL 76 >78 mg/dL   VLDL Cholesterol Cal 20 5 - 40 mg/dL   LDL Chol Calc (NIH) 75 0 - 99 mg/dL   Chol/HDL Ratio 2.3 0.0 - 4.4 ratio      Assessment & Plan:   Problem List Items Addressed This Visit     Insomnia    Acute, stable. Will change Seroquel 50 MG from immediate release to extended release 50 MG. Recommend continue sleep hygiene such as dark lit room and minimal noise. Return in 1 month.       Cellulitis of right knee - Primary    Acute, ongoing. Fall occurred 2 weeks ago. Will treat with Bactrim BID for 10 days. Return in 2 weeks, if no improvement will do [place] of right lower extremity to rule out clot. Recommend leg elevation to help with edema.          Follow up plan: Return in about 10 days (around 01/19/2023) for Follow up knee.

## 2023-01-09 NOTE — Assessment & Plan Note (Addendum)
Acute, ongoing. Fall occurred 2 weeks ago. Will treat with Bactrim BID for 10 days. Return in 2 weeks, if no improvement will do [place] of right lower extremity to rule out clot. Recommend leg elevation to help with edema.

## 2023-01-09 NOTE — Assessment & Plan Note (Addendum)
Acute, stable. Will change Seroquel 50 MG from immediate release to extended release 50 MG. Recommend continue sleep hygiene such as dark lit room and minimal noise. Return in 1 month.

## 2023-01-09 NOTE — Patient Instructions (Addendum)
Check BP daily along with writing readings down to bring in 2 weeks Take BP medication every morning  Elevate legs to help with swelling

## 2023-01-20 ENCOUNTER — Ambulatory Visit: Payer: 59 | Admitting: Family Medicine

## 2023-01-21 DIAGNOSIS — Z79891 Long term (current) use of opiate analgesic: Secondary | ICD-10-CM | POA: Diagnosis not present

## 2023-01-21 DIAGNOSIS — G894 Chronic pain syndrome: Secondary | ICD-10-CM | POA: Diagnosis not present

## 2023-02-18 ENCOUNTER — Other Ambulatory Visit: Payer: 59

## 2023-02-18 DIAGNOSIS — Z79891 Long term (current) use of opiate analgesic: Secondary | ICD-10-CM | POA: Diagnosis not present

## 2023-02-18 DIAGNOSIS — G894 Chronic pain syndrome: Secondary | ICD-10-CM | POA: Diagnosis not present

## 2023-02-19 DIAGNOSIS — Z20822 Contact with and (suspected) exposure to covid-19: Secondary | ICD-10-CM | POA: Diagnosis not present

## 2023-02-28 ENCOUNTER — Encounter: Payer: Self-pay | Admitting: Nurse Practitioner

## 2023-02-28 ENCOUNTER — Ambulatory Visit (INDEPENDENT_AMBULATORY_CARE_PROVIDER_SITE_OTHER): Payer: 59 | Admitting: Nurse Practitioner

## 2023-02-28 VITALS — BP 131/74 | HR 97 | Temp 97.7°F | Ht 64.57 in | Wt 148.2 lb

## 2023-02-28 DIAGNOSIS — E782 Mixed hyperlipidemia: Secondary | ICD-10-CM | POA: Diagnosis not present

## 2023-02-28 DIAGNOSIS — N184 Chronic kidney disease, stage 4 (severe): Secondary | ICD-10-CM

## 2023-02-28 DIAGNOSIS — I509 Heart failure, unspecified: Secondary | ICD-10-CM | POA: Diagnosis not present

## 2023-02-28 DIAGNOSIS — R062 Wheezing: Secondary | ICD-10-CM

## 2023-02-28 DIAGNOSIS — M8700 Idiopathic aseptic necrosis of unspecified bone: Secondary | ICD-10-CM | POA: Diagnosis not present

## 2023-02-28 DIAGNOSIS — J449 Chronic obstructive pulmonary disease, unspecified: Secondary | ICD-10-CM | POA: Diagnosis not present

## 2023-02-28 DIAGNOSIS — I110 Hypertensive heart disease with heart failure: Secondary | ICD-10-CM

## 2023-02-28 MED ORDER — PSEUDOEPH-BROMPHEN-DM 30-2-10 MG/5ML PO SYRP: 5.0000 mL | ORAL_SOLUTION | Freq: Four times a day (QID) | ORAL | 0 refills | Status: DC | PRN

## 2023-02-28 MED ORDER — PREDNISONE 10 MG PO TABS: 10.0000 mg | ORAL_TABLET | Freq: Every day | ORAL | 0 refills | Status: DC

## 2023-02-28 NOTE — Assessment & Plan Note (Signed)
 Ongoing problem.  Has had several hip replacements.  Followed by pain management and Ortho.  Continue to follow up with specialists.

## 2023-02-28 NOTE — Assessment & Plan Note (Signed)
 Chronic.  Controlled.  Continue with current medication regimen.  Labs ordered today.  Return to clinic in 6 months for reevaluation.  Call sooner if concerns arise.  ? ?

## 2023-02-28 NOTE — Assessment & Plan Note (Signed)
Chronic.  Controlled.  Continue with current medication regimen.  Labs ordered today. Does not see Cardiology.  Declines referral at this time.  Return to clinic in 3 months for reevaluation.  Call sooner if concerns arise.

## 2023-02-28 NOTE — Assessment & Plan Note (Signed)
Chronic.  Labs ordered today.  Continue with Atorvastatin 40mg  daily.  Will make recommendations based on lab results. Follow up in 6 months.  Call sooner if concerns arise.

## 2023-02-28 NOTE — Assessment & Plan Note (Signed)
Chronic.  Controlled.  Continue with current medication regimen.  Avoid NSAID use.  Can consider addition of SGLT2 to help with kidney function.  Labs ordered today.  Return to clinic in 3 months for reevaluation.  Call sooner if concerns arise.

## 2023-02-28 NOTE — Progress Notes (Signed)
 BP 131/74 (BP Location: Right Arm, Patient Position: Sitting, Cuff Size: Large)   Pulse 97   Temp 97.7 F (36.5 C) (Oral)   Ht 5' 4.57 (1.64 m)   Wt 148 lb 3.2 oz (67.2 kg)   LMP 09/13/1975 (Approximate)   SpO2 94%   BMI 24.99 kg/m    Subjective:    Patient ID: Amy Rocha, female    DOB: 05/05/1955, 68 y.o.   MRN: [id number]  HPI: Amy Rocha is a 68 y.o. female  Chief Complaint  Patient presents with   RSV   3 month follow up   HYPERTENSION / HYPERLIPIDEMIA Doing well with medications.  Denies concerns at visit today.  Satisfied with current treatment? yes Duration of hypertension: years BP monitoring frequency: sometimes BP range: 130/80 BP medication side effects: no Past BP meds: losartan  (cozaar ) and Amlodipine  Duration of hyperlipidemia: years Cholesterol medication side effects: no Cholesterol supplements: none Past cholesterol medications: atorvastain (lipitor) Medication compliance: excellent compliance Aspirin : no   COPD Using her symbiort as perscribed COPD status: controlled Satisfied with current treatment?: yes Oxygen  use: no Dyspnea frequency: occasional Cough frequency:  Rescue inhaler frequency:  3x daily Limitation of activity: yes Productive cough:  Last Spirometry:  Pneumovax: Up to Date Influenza: Up to Date   CHRONIC KIDNEY DISEASE CKD status: controlled Medications renally dose: yes Previous renal evaluation: no Pneumovax:  Up to Date Influenza Vaccine:  Up to Date  DEPRESSION Has not been good due to being sick.  Feels like if she could get over this cold she would be feeling better.  Mood status: improved Satisfied with current treatment?: no Symptom severity: moderate Duration of current treatment : years Side effects: no Medication compliance: excellent compliance Psychotherapy/counseling: no none Previous psychiatric medications: abilify , effexor, and wellbutrin  Tritellinx. Effexor made patient gain weight.  She is not sure why she stopped the Abilify . Depressed mood: yes Anxious mood: yes Anhedonia: yes Significant weight loss or gain: no Insomnia: no NA Fatigue: yes Feelings of worthlessness or guilt: yes Impaired concentration/indecisiveness: no Suicidal ideations: no Hopelessness: yes Crying spells: yes    07/17/2022    1:19 PM 06/18/2022   10:34 AM 06/17/2022   10:14 AM 06/10/2022   11:25 AM 05/21/2022    2:15 PM  Depression screen PHQ 2/9  Decreased Interest 1 1 1 3 1   Down, Depressed, Hopeless 1 1 1 3  0  PHQ - 2 Score 2 2 2 6 1   Altered sleeping 1 2 2  0 1  Tired, decreased energy 1 3 3 2 1   Change in appetite 0 1 0 2 1  Feeling bad or failure about yourself  0 0 0 3 0  Trouble concentrating 0 0 0 3 0  Moving slowly or fidgety/restless 0 0 0 0 0  Suicidal thoughts 0 0 0 0 0  PHQ-9 Score 4 8 7 16 4   Difficult doing work/chores Not difficult at all  Not difficult at all Not difficult at all Not difficult at all   Patient went to the UC on 1/1 and was diagnosed with RSV.  She started getting sick 2 weeks before christmas.  She has been sick for almost a month but isn't seeing improvement in her symptoms.  Temp is 101.something off and on.  Coughing, shortness of breathing, wheezing, chest pain and chest tightness, ear pain, pressure, sore throat, nasal congestion, headaches.  Relevant past medical, surgical, family and social history reviewed and updated as indicated. Interim medical history since our  last visit reviewed. Allergies and medications reviewed and updated.  Review of Systems  Constitutional:  Positive for fatigue and fever.  HENT:  Positive for congestion, ear pain, sinus pressure, sinus pain and sore throat. Negative for dental problem, postnasal drip, rhinorrhea and sneezing.   Respiratory:  Positive for cough, chest tightness, shortness of breath and wheezing.   Cardiovascular:  Positive for chest pain.  Gastrointestinal:  Negative for vomiting.  Skin:  Negative  for rash.  Neurological:  Positive for headaches.    Per HPI unless specifically indicated above     Objective:    BP 131/74 (BP Location: Right Arm, Patient Position: Sitting, Cuff Size: Large)   Pulse 97   Temp 97.7 F (36.5 C) (Oral)   Ht 5' 4.57 (1.64 m)   Wt 148 lb 3.2 oz (67.2 kg)   LMP 09/13/1975 (Approximate)   SpO2 94%   BMI 24.99 kg/m   Wt Readings from Last 3 Encounters:  02/28/23 148 lb 3.2 oz (67.2 kg)  01/09/23 154 lb 3.2 oz (69.9 kg)  11/28/22 148 lb 6.4 oz (67.3 kg)    Physical Exam Vitals and nursing note reviewed.  Constitutional:      General: She is not in acute distress.    Appearance: Normal appearance. She is normal weight. She is not ill-appearing, toxic-appearing or diaphoretic.  HENT:     Head: Normocephalic.     Right Ear: External ear normal.     Left Ear: External ear normal.     Nose: Nose normal.     Mouth/Throat:     Mouth: Mucous membranes are moist.     Pharynx: Oropharynx is clear.  Eyes:     General:        Right eye: No discharge.        Left eye: No discharge.     Extraocular Movements: Extraocular movements intact.     Conjunctiva/sclera: Conjunctivae normal.     Pupils: Pupils are equal, round, and reactive to light.  Cardiovascular:     Rate and Rhythm: Normal rate and regular rhythm.     Heart sounds: No murmur heard. Pulmonary:     Effort: Pulmonary effort is normal. No respiratory distress.     Breath sounds: Wheezing present. No rales.     Comments: crackles Musculoskeletal:     Cervical back: Normal range of motion and neck supple.  Skin:    General: Skin is warm and dry.     Capillary Refill: Capillary refill takes less than 2 seconds.  Neurological:     General: No focal deficit present.     Mental Status: She is alert and oriented to person, place, and time. Mental status is at baseline.  Psychiatric:        Mood and Affect: Mood normal.        Behavior: Behavior normal.        Thought Content: Thought  content normal.        Judgment: Judgment normal.     Results for orders placed or performed in visit on 11/28/22  Comp Met (CMET)   Collection Time: 11/28/22  1:55 PM  Result Value Ref Range   Glucose 87 70 - 99 mg/dL   BUN 15 8 - 27 mg/dL   Creatinine, Ser 8.64 (H) 0.57 - 1.00 mg/dL   eGFR 43 (L) >40 fO/fpw/8.26   BUN/Creatinine Ratio 11 (L) 12 - 28   Sodium 138 134 - 144 mmol/L   Potassium 4.6 3.5 - 5.2  mmol/L   Chloride 102 96 - 106 mmol/L   CO2 20 20 - 29 mmol/L   Calcium  9.1 8.7 - 10.3 mg/dL   Total Protein 6.7 6.0 - 8.5 g/dL   Albumin 3.9 3.9 - 4.9 g/dL   Globulin, Total 2.8 1.5 - 4.5 g/dL   Bilirubin Total 0.3 0.0 - 1.2 mg/dL   Alkaline Phosphatase 127 (H) 44 - 121 IU/L   AST 48 (H) 0 - 40 IU/L   ALT 33 (H) 0 - 32 IU/L  HgB A1c   Collection Time: 11/28/22  1:55 PM  Result Value Ref Range   Hgb A1c MFr Bld 5.7 (H) 4.8 - 5.6 %   Est. average glucose Bld gHb Est-mCnc 117 mg/dL  Vitamin D  (25 hydroxy)   Collection Time: 11/28/22  1:55 PM  Result Value Ref Range   Vit D, 25-Hydroxy 28.0 (L) 30.0 - 100.0 ng/mL  CBC w/Diff   Collection Time: 11/28/22  1:55 PM  Result Value Ref Range   WBC 7.6 3.4 - 10.8 x10E3/uL   RBC 4.36 3.77 - 5.28 x10E6/uL   Hemoglobin 13.7 11.1 - 15.9 g/dL   Hematocrit 59.6 65.9 - 46.6 %   MCV 92 79 - 97 fL   MCH 31.4 26.6 - 33.0 pg   MCHC 34.0 31.5 - 35.7 g/dL   RDW 85.9 88.2 - 84.5 %   Platelets 322 150 - 450 x10E3/uL   Neutrophils 54 Not Estab. %   Lymphs 26 Not Estab. %   Monocytes 12 Not Estab. %   Eos 5 Not Estab. %   Basos 2 Not Estab. %   Neutrophils Absolute 4.1 1.4 - 7.0 x10E3/uL   Lymphocytes Absolute 2.0 0.7 - 3.1 x10E3/uL   Monocytes Absolute 0.9 0.1 - 0.9 x10E3/uL   EOS (ABSOLUTE) 0.4 0.0 - 0.4 x10E3/uL   Basophils Absolute 0.1 0.0 - 0.2 x10E3/uL   Immature Granulocytes 1 Not Estab. %   Immature Grans (Abs) 0.0 0.0 - 0.1 x10E3/uL  Lipid Profile   Collection Time: 11/28/22  1:55 PM  Result Value Ref Range   Cholesterol,  Total 171 100 - 199 mg/dL   Triglycerides 882 0 - 149 mg/dL   HDL 76 >60 mg/dL   VLDL Cholesterol Cal 20 5 - 40 mg/dL   LDL Chol Calc (NIH) 75 0 - 99 mg/dL   Chol/HDL Ratio 2.3 0.0 - 4.4 ratio      Assessment & Plan:   Problem List Items Addressed This Visit       Cardiovascular and Mediastinum   Hypertensive heart failure (HCC)   Chronic.  Controlled.  Continue with current medication regimen.  Labs ordered today.  Return to clinic in 6 months for reevaluation.  Call sooner if concerns arise.        Relevant Orders   Comp Met (CMET)   CHF (congestive heart failure) (HCC)   Chronic.  Controlled.  Continue with current medication regimen.  Labs ordered today. Does not see Cardiology.  Declines referral at this time.  Return to clinic in 3 months for reevaluation.  Call sooner if concerns arise.         Respiratory   COPD (chronic obstructive pulmonary disease) (HCC)   Chronic.  Not well controlled today due to recent illness with RSV.  Patient is continuing to have symptoms several weeks into illness.  Repeated course of prednisone  and chest xray ordered for evaluation of pneumonia.  Wheezing and crackles heard on exam.  Continue with current medication  regimen of Symbicort . Labs ordered today.  Return to clinic in 3 months for reevaluation.  Can consider spirometry in the future if symptoms worsen.  Call sooner if concerns arise.       Relevant Medications   brompheniramine-pseudoephedrine-DM (BROMFED DM) 30-2-10 MG/5ML syrup   predniSONE  (DELTASONE ) 10 MG tablet   Other Relevant Orders   CBC w/Diff     Musculoskeletal and Integument   Avascular necrosis (HCC) - Primary   Ongoing problem.  Has had several hip replacements.  Followed by pain management and Ortho.  Continue to follow up with specialists.        Genitourinary   CKD (chronic kidney disease), stage IV (HCC)   Chronic.  Controlled.  Continue with current medication regimen.  Avoid NSAID use.  Can consider  addition of SGLT2 to help with kidney function.  Labs ordered today.  Return to clinic in 3 months for reevaluation.  Call sooner if concerns arise.       Relevant Orders   CBC w/Diff     Other   Hyperlipidemia   Chronic.  Labs ordered today.  Continue with Atorvastatin  40mg  daily.  Will make recommendations based on lab results. Follow up in 6 months.  Call sooner if concerns arise.       Relevant Orders   Lipid Profile   Other Visit Diagnoses       Wheezing       Relevant Orders   DG Chest 2 View        Follow up plan: Return in about 1 week (around 03/07/2023) for Lung check.

## 2023-02-28 NOTE — Assessment & Plan Note (Signed)
 Chronic.  Not well controlled today due to recent illness with RSV.  Patient is continuing to have symptoms several weeks into illness.  Repeated course of prednisone  and chest xray ordered for evaluation of pneumonia.  Wheezing and crackles heard on exam.  Continue with current medication regimen of Symbicort . Labs ordered today.  Return to clinic in 3 months for reevaluation.  Can consider spirometry in the future if symptoms worsen.  Call sooner if concerns arise.

## 2023-03-01 LAB — COMPREHENSIVE METABOLIC PANEL
ALT: 13 [IU]/L (ref 0–32)
AST: 21 [IU]/L (ref 0–40)
Albumin: 3.8 g/dL — ABNORMAL LOW (ref 3.9–4.9)
Alkaline Phosphatase: 115 [IU]/L (ref 44–121)
BUN/Creatinine Ratio: 13 (ref 12–28)
BUN: 15 mg/dL (ref 8–27)
Bilirubin Total: 0.5 mg/dL (ref 0.0–1.2)
CO2: 20 mmol/L (ref 20–29)
Calcium: 9 mg/dL (ref 8.7–10.3)
Chloride: 103 mmol/L (ref 96–106)
Creatinine, Ser: 1.15 mg/dL — ABNORMAL HIGH (ref 0.57–1.00)
Globulin, Total: 2.4 g/dL (ref 1.5–4.5)
Glucose: 87 mg/dL (ref 70–99)
Potassium: 4.3 mmol/L (ref 3.5–5.2)
Sodium: 138 mmol/L (ref 134–144)
Total Protein: 6.2 g/dL (ref 6.0–8.5)
eGFR: 52 mL/min/{1.73_m2} — ABNORMAL LOW (ref >59)

## 2023-03-01 LAB — CBC WITH DIFFERENTIAL/PLATELET
Basophils Absolute: 0.1 10*3/uL (ref 0.0–0.2)
Basos: 1 %
EOS (ABSOLUTE): 0.5 10*3/uL — ABNORMAL HIGH (ref 0.0–0.4)
Eos: 4 %
Hematocrit: 40.5 % (ref 34.0–46.6)
Hemoglobin: 13.7 g/dL (ref 11.1–15.9)
Immature Grans (Abs): 0.1 10*3/uL (ref 0.0–0.1)
Immature Granulocytes: 1 %
Lymphocytes Absolute: 2.5 10*3/uL (ref 0.7–3.1)
Lymphs: 19 %
MCH: 31.7 pg (ref 26.6–33.0)
MCHC: 33.8 g/dL (ref 31.5–35.7)
MCV: 94 fL (ref 79–97)
Monocytes Absolute: 1.2 10*3/uL — ABNORMAL HIGH (ref 0.1–0.9)
Monocytes: 9 %
Neutrophils Absolute: 8.9 10*3/uL — ABNORMAL HIGH (ref 1.4–7.0)
Neutrophils: 66 %
Platelets: 380 10*3/uL (ref 150–450)
RBC: 4.32 x10E6/uL (ref 3.77–5.28)
RDW: 14 % (ref 11.7–15.4)
WBC: 13.2 10*3/uL — ABNORMAL HIGH (ref 3.4–10.8)

## 2023-03-01 LAB — LIPID PANEL
Chol/HDL Ratio: 2.2 {ratio} (ref 0.0–4.4)
Cholesterol, Total: 173 mg/dL (ref 100–199)
HDL: 77 mg/dL (ref >39)
LDL Chol Calc (NIH): 76 mg/dL (ref 0–99)
Triglycerides: 114 mg/dL (ref 0–149)
VLDL Cholesterol Cal: 20 mg/dL (ref 5–40)

## 2023-03-04 ENCOUNTER — Ambulatory Visit
Admission: RE | Admit: 2023-03-04 | Discharge: 2023-03-04 | Disposition: A | Discharge status: Home / Self Care | Payer: 59 | Attending: Nurse Practitioner | Admitting: Nurse Practitioner

## 2023-03-04 ENCOUNTER — Ambulatory Visit
Admission: RE | Admit: 2023-03-04 | Discharge: 2023-03-04 | Disposition: A | Discharge status: Home / Self Care | Payer: 59 | Source: Ambulatory Visit | Attending: Nurse Practitioner | Admitting: Nurse Practitioner

## 2023-03-04 ENCOUNTER — Telehealth: Payer: Self-pay

## 2023-03-04 DIAGNOSIS — R062 Wheezing: Secondary | ICD-10-CM | POA: Insufficient documentation

## 2023-03-04 DIAGNOSIS — J449 Chronic obstructive pulmonary disease, unspecified: Secondary | ICD-10-CM | POA: Diagnosis not present

## 2023-03-04 NOTE — Telephone Encounter (Signed)
 Pt called for xray results. Shared provider's note. No questions.  Larae Grooms, NP 03/04/2023 12:43 PM EST     Her chest xray does not show any evidence of pneumonia.  Complete the course of medication prescribed during the visit.

## 2023-03-07 ENCOUNTER — Encounter: Payer: Self-pay | Admitting: Nurse Practitioner

## 2023-03-07 ENCOUNTER — Ambulatory Visit (INDEPENDENT_AMBULATORY_CARE_PROVIDER_SITE_OTHER): Payer: 59 | Admitting: Nurse Practitioner

## 2023-03-07 VITALS — BP 128/85 | HR 85 | Temp 97.5°F | Wt 148.2 lb

## 2023-03-07 DIAGNOSIS — J449 Chronic obstructive pulmonary disease, unspecified: Secondary | ICD-10-CM | POA: Diagnosis not present

## 2023-03-07 MED ORDER — PSEUDOEPH-BROMPHEN-DM 30-2-10 MG/5ML PO SYRP: 5.0000 mL | ORAL_SOLUTION | Freq: Four times a day (QID) | ORAL | 0 refills | Status: AC | PRN

## 2023-03-07 NOTE — Assessment & Plan Note (Signed)
Improved from last visit. No wheezing or crackles on exam.  Chest xray negative for pneumonia.  No red flags on exam today.  Refilled cough medicine.  Discussed course of viral treatment.

## 2023-03-07 NOTE — Progress Notes (Signed)
BP 128/85   Pulse 85   Temp (!) 97.5 F (36.4 C) (Oral)   Wt 148 lb 3.2 oz (67.2 kg)   LMP 09/13/1975 (Approximate)   SpO2 98%   BMI 24.99 kg/m    Subjective:    Patient ID: Amy Rocha, female    DOB: 1955/10/03, 68 y.o.   MRN: [id number]  HPI: Amy Rocha is a 68 y.o. female  Chief Complaint  Patient presents with   Lung Check   03/07/23:  Patient states she is feeling a little bit better.  Her chest xray was negative for pneumonia.  She does still have some congestion.  Doesn't feel like her cough is getting better.  Does feel like her SOB is getting better.  Denies wheezing.   02/28/23: Patient went to the UC on 1/1 and was diagnosed with RSV.  She started getting sick 2 weeks before christmas.  She has been sick for almost a month but isn't seeing improvement in her symptoms.  Temp is 101.something off and on.  Coughing, shortness of breathing, wheezing, chest pain and chest tightness, ear pain, pressure, sore throat, nasal congestion, headaches.  Relevant past medical, surgical, family and social history reviewed and updated as indicated. Interim medical history since our last visit reviewed. Allergies and medications reviewed and updated.  Review of Systems  Constitutional:  Negative for fever.  HENT:  Positive for congestion.   Respiratory:  Positive for cough and shortness of breath. Negative for wheezing.     Per HPI unless specifically indicated above     Objective:    BP 128/85   Pulse 85   Temp (!) 97.5 F (36.4 C) (Oral)   Wt 148 lb 3.2 oz (67.2 kg)   LMP 09/13/1975 (Approximate)   SpO2 98%   BMI 24.99 kg/m   Wt Readings from Last 3 Encounters:  03/07/23 148 lb 3.2 oz (67.2 kg)  02/28/23 148 lb 3.2 oz (67.2 kg)  01/09/23 154 lb 3.2 oz (69.9 kg)    Physical Exam Vitals and nursing note reviewed.  Constitutional:      General: She is not in acute distress.    Appearance: Normal appearance. She is normal weight. She is not ill-appearing,  toxic-appearing or diaphoretic.  HENT:     Head: Normocephalic.     Right Ear: External ear normal.     Left Ear: External ear normal.     Nose: Nose normal.     Mouth/Throat:     Mouth: Mucous membranes are moist.     Pharynx: Oropharynx is clear.  Eyes:     General:        Right eye: No discharge.        Left eye: No discharge.     Extraocular Movements: Extraocular movements intact.     Conjunctiva/sclera: Conjunctivae normal.     Pupils: Pupils are equal, round, and reactive to light.  Cardiovascular:     Rate and Rhythm: Normal rate and regular rhythm.     Heart sounds: No murmur heard. Pulmonary:     Effort: Pulmonary effort is normal. No respiratory distress.     Breath sounds: Wheezing present. No rales.     Comments: crackles Musculoskeletal:     Cervical back: Normal range of motion and neck supple.  Skin:    General: Skin is warm and dry.     Capillary Refill: Capillary refill takes less than 2 seconds.  Neurological:     General: No focal deficit  present.     Mental Status: She is alert and oriented to person, place, and time. Mental status is at baseline.  Psychiatric:        Mood and Affect: Mood normal.        Behavior: Behavior normal.        Thought Content: Thought content normal.        Judgment: Judgment normal.     Results for orders placed or performed in visit on 02/28/23  Comp Met (CMET)   Collection Time: 02/28/23 10:50 AM  Result Value Ref Range   Glucose 87 70 - 99 mg/dL   BUN 15 8 - 27 mg/dL   Creatinine, Ser 9.62 (H) 0.57 - 1.00 mg/dL   eGFR 52 (L) >95 MW/UXL/2.44   BUN/Creatinine Ratio 13 12 - 28   Sodium 138 134 - 144 mmol/L   Potassium 4.3 3.5 - 5.2 mmol/L   Chloride 103 96 - 106 mmol/L   CO2 20 20 - 29 mmol/L   Calcium 9.0 8.7 - 10.3 mg/dL   Total Protein 6.2 6.0 - 8.5 g/dL   Albumin 3.8 (L) 3.9 - 4.9 g/dL   Globulin, Total 2.4 1.5 - 4.5 g/dL   Bilirubin Total 0.5 0.0 - 1.2 mg/dL   Alkaline Phosphatase 115 44 - 121 IU/L   AST  21 0 - 40 IU/L   ALT 13 0 - 32 IU/L  CBC w/Diff   Collection Time: 02/28/23 10:50 AM  Result Value Ref Range   WBC 13.2 (H) 3.4 - 10.8 x10E3/uL   RBC 4.32 3.77 - 5.28 x10E6/uL   Hemoglobin 13.7 11.1 - 15.9 g/dL   Hematocrit 01.0 27.2 - 46.6 %   MCV 94 79 - 97 fL   MCH 31.7 26.6 - 33.0 pg   MCHC 33.8 31.5 - 35.7 g/dL   RDW 53.6 64.4 - 03.4 %   Platelets 380 150 - 450 x10E3/uL   Neutrophils 66 Not Estab. %   Lymphs 19 Not Estab. %   Monocytes 9 Not Estab. %   Eos 4 Not Estab. %   Basos 1 Not Estab. %   Neutrophils Absolute 8.9 (H) 1.4 - 7.0 x10E3/uL   Lymphocytes Absolute 2.5 0.7 - 3.1 x10E3/uL   Monocytes Absolute 1.2 (H) 0.1 - 0.9 x10E3/uL   EOS (ABSOLUTE) 0.5 (H) 0.0 - 0.4 x10E3/uL   Basophils Absolute 0.1 0.0 - 0.2 x10E3/uL   Immature Granulocytes 1 Not Estab. %   Immature Grans (Abs) 0.1 0.0 - 0.1 x10E3/uL  Lipid Profile   Collection Time: 02/28/23 10:50 AM  Result Value Ref Range   Cholesterol, Total 173 100 - 199 mg/dL   Triglycerides 742 0 - 149 mg/dL   HDL 77 >59 mg/dL   VLDL Cholesterol Cal 20 5 - 40 mg/dL   LDL Chol Calc (NIH) 76 0 - 99 mg/dL   Chol/HDL Ratio 2.2 0.0 - 4.4 ratio      Assessment & Plan:   Problem List Items Addressed This Visit       Respiratory   COPD (chronic obstructive pulmonary disease) (HCC) - Primary   Improved from last visit. No wheezing or crackles on exam.  Chest xray negative for pneumonia.  No red flags on exam today.  Refilled cough medicine.  Discussed course of viral treatment.       Relevant Medications   brompheniramine-pseudoephedrine-DM (BROMFED DM) 30-2-10 MG/5ML syrup      Follow up plan: Return in about 3 months (around 06/05/2023) for HTN, HLD,  DM2 FU.

## 2023-03-18 DIAGNOSIS — Z79891 Long term (current) use of opiate analgesic: Secondary | ICD-10-CM | POA: Diagnosis not present

## 2023-03-18 DIAGNOSIS — M5135 Other intervertebral disc degeneration, thoracolumbar region: Secondary | ICD-10-CM | POA: Diagnosis not present

## 2023-03-18 DIAGNOSIS — G5601 Carpal tunnel syndrome, right upper limb: Secondary | ICD-10-CM | POA: Diagnosis not present

## 2023-03-18 DIAGNOSIS — G894 Chronic pain syndrome: Secondary | ICD-10-CM | POA: Diagnosis not present

## 2023-04-08 ENCOUNTER — Other Ambulatory Visit: Payer: Self-pay

## 2023-04-08 MED ORDER — QUETIAPINE FUMARATE ER 50 MG PO TB24: 50.0000 mg | ORAL_TABLET | Freq: Every day | ORAL | 0 refills | Status: DC

## 2023-04-15 DIAGNOSIS — Z79891 Long term (current) use of opiate analgesic: Secondary | ICD-10-CM | POA: Diagnosis not present

## 2023-04-15 DIAGNOSIS — M5135 Other intervertebral disc degeneration, thoracolumbar region: Secondary | ICD-10-CM | POA: Diagnosis not present

## 2023-04-15 DIAGNOSIS — G894 Chronic pain syndrome: Secondary | ICD-10-CM | POA: Diagnosis not present

## 2023-04-15 DIAGNOSIS — M169 Osteoarthritis of hip, unspecified: Secondary | ICD-10-CM | POA: Diagnosis not present

## 2023-04-24 ENCOUNTER — Ambulatory Visit
Admission: RE | Admit: 2023-04-24 | Discharge: 2023-04-24 | Disposition: A | Discharge status: Home / Self Care | Payer: 59 | Source: Ambulatory Visit | Attending: Nurse Practitioner | Admitting: Nurse Practitioner

## 2023-04-24 DIAGNOSIS — Z1231 Encounter for screening mammogram for malignant neoplasm of breast: Secondary | ICD-10-CM | POA: Insufficient documentation

## 2023-05-13 DIAGNOSIS — M5135 Other intervertebral disc degeneration, thoracolumbar region: Secondary | ICD-10-CM | POA: Diagnosis not present

## 2023-05-13 DIAGNOSIS — G894 Chronic pain syndrome: Secondary | ICD-10-CM | POA: Diagnosis not present

## 2023-05-13 DIAGNOSIS — Z79891 Long term (current) use of opiate analgesic: Secondary | ICD-10-CM | POA: Diagnosis not present

## 2023-05-13 DIAGNOSIS — M169 Osteoarthritis of hip, unspecified: Secondary | ICD-10-CM | POA: Diagnosis not present

## 2023-06-10 ENCOUNTER — Ambulatory Visit: Payer: Self-pay | Admitting: Nurse Practitioner

## 2023-06-10 DIAGNOSIS — M169 Osteoarthritis of hip, unspecified: Secondary | ICD-10-CM | POA: Diagnosis not present

## 2023-06-10 DIAGNOSIS — G894 Chronic pain syndrome: Secondary | ICD-10-CM | POA: Diagnosis not present

## 2023-06-10 DIAGNOSIS — Z79891 Long term (current) use of opiate analgesic: Secondary | ICD-10-CM | POA: Diagnosis not present

## 2023-06-10 DIAGNOSIS — M5135 Other intervertebral disc degeneration, thoracolumbar region: Secondary | ICD-10-CM | POA: Diagnosis not present

## 2023-06-11 ENCOUNTER — Telehealth: Payer: Self-pay

## 2023-06-11 DIAGNOSIS — K219 Gastro-esophageal reflux disease without esophagitis: Secondary | ICD-10-CM

## 2023-06-11 NOTE — Telephone Encounter (Unsigned)
 Copied from CRM [id number]. Topic: Referral - Request for Referral >> Jun 11, 2023  3:20 PM Everette C wrote: Did the patient discuss referral with their provider in the last year? Yes (If No - schedule appointment) (If Yes - send message)  Appointment offered? No  Type of order/referral and detailed reason for visit: choking concerns, esophagus concerns Gastroenterology   Preference of office, provider, location: Dr. Luke Salaam, [phone]  If referral order, have you been seen by this specialty before? Yes (If Yes, this issue or another issue? When? Where?  Can we respond through MyChart? No

## 2023-06-12 NOTE — Telephone Encounter (Signed)
 [facility] does not currently have any providers.  Would she be okay with seeing [facility]?

## 2023-06-12 NOTE — Telephone Encounter (Signed)
I already put the referral in.  

## 2023-06-12 NOTE — Telephone Encounter (Signed)
 Called and spoke with patient. She states that she is ok with going to [facility].

## 2023-06-16 ENCOUNTER — Other Ambulatory Visit: Payer: Self-pay | Admitting: Nurse Practitioner

## 2023-06-18 NOTE — Telephone Encounter (Signed)
 Requested medication (s) are due for refill today: yes  Requested medication (s) are on the active medication list: yes  Last refill:  04/08/23  Future visit scheduled: no  Notes to clinic:  Unable to refill per protocol, cannot delegate.      Requested Prescriptions  Pending Prescriptions Disp Refills   QUEtiapine  (SEROQUEL  XR) 50 MG TB24 24 hr tablet [Pharmacy Med Name: QUETIAPINE  FUMARATE ER 50 M 50 Tablet] 90 tablet 1    Sig: Take 1 tablet (50 mg total) by mouth at bedtime.     Not Delegated - Psychiatry:  Antipsychotics - Second Generation (Atypical) - quetiapine  Failed - 06/18/2023 10:53 AM      Failed - This refill cannot be delegated      Failed - TSH in normal range and within 360 days    TSH  Date Value Ref Range Status  06/07/2022 1.634 0.350 - 4.500 uIU/mL Final    Comment:    Performed by a 3rd Generation assay with a functional sensitivity of <=0.01 uIU/mL. Performed at [facility], [address] [place], [place] [address]   05/21/2022 0.251 (L) 0.450 - 4.500 uIU/mL Final         Failed - Valid encounter within last 6 months    Recent Outpatient Visits   None            Failed - Lipid Panel in normal range within the last 12 months    Cholesterol, Total  Date Value Ref Range Status  02/28/2023 173 100 - 199 mg/dL Final   LDL Chol Calc (NIH)  Date Value Ref Range Status  02/28/2023 76 0 - 99 mg/dL Final   HDL  Date Value Ref Range Status  02/28/2023 77 >39 mg/dL Final   Triglycerides  Date Value Ref Range Status  02/28/2023 114 0 - 149 mg/dL Final         Passed - Completed PHQ-2 or PHQ-9 in the last 360 days      Passed - Last BP in normal range    BP Readings from Last 1 Encounters:  03/07/23 128/85         Passed - Last Heart Rate in normal range    Pulse Readings from Last 1 Encounters:  03/07/23 85         Passed - CBC within normal limits and completed in the last 12 months    WBC  Date Value Ref Range Status   02/28/2023 13.2 (H) 3.4 - 10.8 x10E3/uL Final  10/03/2022 13.1 (H) 4.0 - 10.5 K/uL Final   RBC  Date Value Ref Range Status  02/28/2023 4.32 3.77 - 5.28 x10E6/uL Final  10/03/2022 4.14 3.87 - 5.11 MIL/uL Final   Hemoglobin  Date Value Ref Range Status  02/28/2023 13.7 11.1 - 15.9 g/dL Final   Hematocrit  Date Value Ref Range Status  02/28/2023 40.5 34.0 - 46.6 % Final   MCHC  Date Value Ref Range Status  02/28/2023 33.8 31.5 - 35.7 g/dL Final  60/45/4098 11.9 30.0 - 36.0 g/dL Final   [facility]  Date Value Ref Range Status  02/28/2023 31.7 26.6 - 33.0 pg Final  10/03/2022 30.7 26.0 - 34.0 pg Final   MCV  Date Value Ref Range Status  02/28/2023 94 79 - 97 fL Final  09/14/2013 94 80 - 100 fL Final   No results found for: "PLTCOUNTKUC", "LABPLAT", "POCPLA" RDW  Date Value Ref Range Status  02/28/2023 14.0 11.7 - 15.4 % Final  09/14/2013  13.3 11.5 - 14.5 % Final         Passed - CMP within normal limits and completed in the last 12 months    Albumin  Date Value Ref Range Status  02/28/2023 3.8 (L) 3.9 - 4.9 g/dL Final  95/28/4132 3.9 3.4 - 5.0 g/dL Final   Alkaline Phosphatase  Date Value Ref Range Status  02/28/2023 115 44 - 121 IU/L Final  09/14/2013 100 Unit/L Final    Comment:    46-116 NOTE: New Reference Range 09/07/13    ALT  Date Value Ref Range Status  02/28/2023 13 0 - 32 IU/L Final   SGPT (ALT)  Date Value Ref Range Status  09/14/2013 45 U/L Final    Comment:    14-63 NOTE: New Reference Range 09/07/13    AST  Date Value Ref Range Status  02/28/2023 21 0 - 40 IU/L Final   SGOT(AST)  Date Value Ref Range Status  09/14/2013 36 15 - 37 Unit/L Final   BUN  Date Value Ref Range Status  02/28/2023 15 8 - 27 mg/dL Final  44/02/270 8 7 - 18 mg/dL Final   Calcium   Date Value Ref Range Status  02/28/2023 9.0 8.7 - 10.3 mg/dL Final   Calcium , Total  Date Value Ref Range Status  09/14/2013 8.8 8.5 - 10.1 mg/dL Final   CO2  Date Value  Ref Range Status  02/28/2023 20 20 - 29 mmol/L Final   Co2  Date Value Ref Range Status  09/14/2013 24 21 - 32 mmol/L Final   Creatinine  Date Value Ref Range Status  09/14/2013 0.88 0.60 - 1.30 mg/dL Final   Creatinine, Ser  Date Value Ref Range Status  02/28/2023 1.15 (H) 0.57 - 1.00 mg/dL Final   Glucose  Date Value Ref Range Status  02/28/2023 87 70 - 99 mg/dL Final  53/66/4403 99 65 - 99 mg/dL Final   Glucose, Bld  Date Value Ref Range Status  10/03/2022 119 (H) 70 - 99 mg/dL Final    Comment:    Glucose reference range applies only to samples taken after fasting for at least 8 hours.   Potassium  Date Value Ref Range Status  02/28/2023 4.3 3.5 - 5.2 mmol/L Final  09/14/2013 3.7 3.5 - 5.1 mmol/L Final   Sodium  Date Value Ref Range Status  02/28/2023 138 134 - 144 mmol/L Final  09/14/2013 142 136 - 145 mmol/L Final   Bilirubin,Total  Date Value Ref Range Status  09/14/2013 0.4 0.2 - 1.0 mg/dL Final   Bilirubin Total  Date Value Ref Range Status  02/28/2023 0.5 0.0 - 1.2 mg/dL Final   Protein, ur  Date Value Ref Range Status  06/07/2022 NEGATIVE NEGATIVE mg/dL Final   Total Protein  Date Value Ref Range Status  02/28/2023 6.2 6.0 - 8.5 g/dL Final  47/42/5956 7.5 6.4 - 8.2 g/dL Final   EGFR (African American)  Date Value Ref Range Status  09/14/2013 >60  Final   GFR calc Af Amer  Date Value Ref Range Status  01/20/2020 56 (L) >59 mL/min/1.73 Final    Comment:    **In accordance with recommendations from the NKF-ASN Task force,**   [facility] is in the process of updating its eGFR calculation to the   2021 CKD-EPI creatinine equation that estimates kidney function   without a race variable.    eGFR  Date Value Ref Range Status  02/28/2023 52 (L) >59 mL/min/1.73 Final   EGFR (Non-African Amer.)  Date  Value Ref Range Status  09/14/2013 >60  Final    Comment:    eGFR values <41mL/min/1.73 m2 may be an indication of chronic kidney disease  (CKD). Calculated eGFR is useful in patients with stable renal function. The eGFR calculation will not be reliable in acutely ill patients when serum creatinine is changing rapidly. It is not useful in  patients on dialysis. The eGFR calculation may not be applicable to patients at the low and high extremes of body sizes, pregnant women, and vegetarians.    GFR, Estimated  Date Value Ref Range Status  10/03/2022 32 (L) >60 mL/min Final    Comment:    (NOTE) Calculated using the CKD-EPI Creatinine Equation (2021)

## 2023-06-20 ENCOUNTER — Other Ambulatory Visit: Payer: Self-pay | Admitting: Nurse Practitioner

## 2023-06-23 ENCOUNTER — Other Ambulatory Visit: Payer: Self-pay | Admitting: Nurse Practitioner

## 2023-06-23 NOTE — Telephone Encounter (Unsigned)
 Copied from CRM [id number]. Topic: Clinical - Medication Refill >> Jun 23, 2023  2:53 PM [name] wrote: Most Recent Primary Care Visit:  Provider: Aileen Alexanders  Department: [id number]  Visit Type: OFFICE VISIT  Date: 03/07/2023  Medication: QUEtiapine  (SEROQUEL  XR) 50 MG TB24 24 hr tablet vortioxetine  HBr (TRINTELLIX ) 20 MG TABS tablet   Has the patient contacted their pharmacy? Yes (Agent: If no, request that the patient contact the pharmacy for the refill. If patient does not wish to contact the pharmacy document the reason why and proceed with request.) (Agent: If yes, when and what did the pharmacy advise?)  Is this the correct pharmacy for this prescription? Yes If no, delete pharmacy and type the correct one.  This is the patient's preferred pharmacy:  [facility] - [place], [place] - [address] [name] [address] Phone: [phone] Fax: [phone]     Has the prescription been filled recently? Yes  Is the patient out of the medication? No  Has the patient been seen for an appointment in the last year OR does the patient have an upcoming appointment? Yes  Can we respond through MyChart? Yes  Agent: Please be advised that Rx refills may take up to 3 business days. We ask that you follow-up with your pharmacy.

## 2023-06-23 NOTE — Telephone Encounter (Signed)
 Call to patient- scheduled appointment 07/16/23- will give courtesy RF Requested Prescriptions  Pending Prescriptions Disp Refills   TRINTELLIX  20 MG TABS tablet [Pharmacy Med Name: TRINTELLIX  20 MG TABLET 20 Tablet] 90 tablet 2    Sig: TAKE 1 TABLET (20 MG TOTAL) BY MOUTH DAILY.     Psychiatry: Antidepressants - Serotonin Modulator Failed - 06/23/2023  4:30 PM      Failed - Valid encounter within last 6 months    Recent Outpatient Visits   None            Passed - Completed PHQ-2 or PHQ-9 in the last 360 days

## 2023-06-24 ENCOUNTER — Other Ambulatory Visit: Payer: Self-pay | Admitting: Nurse Practitioner

## 2023-06-25 MED ORDER — QUETIAPINE FUMARATE ER 50 MG PO TB24: 50.0000 mg | ORAL_TABLET | Freq: Every day | ORAL | 0 refills | Status: DC

## 2023-06-25 NOTE — Telephone Encounter (Signed)
 Requested medication (s) are due for refill today: yes  Requested medication (s) are on the active medication list: yes  Last refill:  04/08/23  Future visit scheduled: yes  Notes to clinic:  Unable to refill per protocol, cannot delegate. Due for refill, routing to PCP for approval.      Requested Prescriptions  Pending Prescriptions Disp Refills   QUEtiapine  (SEROQUEL  XR) 50 MG TB24 24 hr tablet 90 tablet 0    Sig: Take 1 tablet (50 mg total) by mouth at bedtime.     Not Delegated - Psychiatry:  Antipsychotics - Second Generation (Atypical) - quetiapine  Failed - 06/25/2023 11:34 AM      Failed - This refill cannot be delegated      Failed - TSH in normal range and within 360 days    TSH  Date Value Ref Range Status  06/07/2022 1.634 0.350 - 4.500 uIU/mL Final    Comment:    Performed by a 3rd Generation assay with a functional sensitivity of <=0.01 uIU/mL. Performed at [facility] [address] [place], [place] [address]   05/21/2022 0.251 (L) 0.450 - 4.500 uIU/mL Final         Failed - Valid encounter within last 6 months    Recent Outpatient Visits   None            Failed - Lipid Panel in normal range within the last 12 months    Cholesterol, Total  Date Value Ref Range Status  02/28/2023 173 100 - 199 mg/dL Final   LDL Chol Calc (NIH)  Date Value Ref Range Status  02/28/2023 76 0 - 99 mg/dL Final   HDL  Date Value Ref Range Status  02/28/2023 77 >39 mg/dL Final   Triglycerides  Date Value Ref Range Status  02/28/2023 114 0 - 149 mg/dL Final         Passed - Completed PHQ-2 or PHQ-9 in the last 360 days      Passed - Last BP in normal range    BP Readings from Last 1 Encounters:  03/07/23 128/85         Passed - Last Heart Rate in normal range    Pulse Readings from Last 1 Encounters:  03/07/23 85         Passed - CBC within normal limits and completed in the last 12 months    WBC  Date Value Ref Range Status  02/28/2023 13.2  (H) 3.4 - 10.8 x10E3/uL Final  10/03/2022 13.1 (H) 4.0 - 10.5 K/uL Final   RBC  Date Value Ref Range Status  02/28/2023 4.32 3.77 - 5.28 x10E6/uL Final  10/03/2022 4.14 3.87 - 5.11 MIL/uL Final   Hemoglobin  Date Value Ref Range Status  02/28/2023 13.7 11.1 - 15.9 g/dL Final   Hematocrit  Date Value Ref Range Status  02/28/2023 40.5 34.0 - 46.6 % Final   MCHC  Date Value Ref Range Status  02/28/2023 33.8 31.5 - 35.7 g/dL Final  19/14/7829 56.2 30.0 - 36.0 g/dL Final   [facility]  Date Value Ref Range Status  02/28/2023 31.7 26.6 - 33.0 pg Final  10/03/2022 30.7 26.0 - 34.0 pg Final   MCV  Date Value Ref Range Status  02/28/2023 94 79 - 97 fL Final  09/14/2013 94 80 - 100 fL Final   No results found for: "PLTCOUNTKUC", "LABPLAT", "POCPLA" RDW  Date Value Ref Range Status  02/28/2023 14.0 11.7 - 15.4 % Final  09/14/2013 13.3 11.5 -  14.5 % Final         Passed - CMP within normal limits and completed in the last 12 months    Albumin  Date Value Ref Range Status  02/28/2023 3.8 (L) 3.9 - 4.9 g/dL Final  24/40/1027 3.9 3.4 - 5.0 g/dL Final   Alkaline Phosphatase  Date Value Ref Range Status  02/28/2023 115 44 - 121 IU/L Final  09/14/2013 100 Unit/L Final    Comment:    46-116 NOTE: New Reference Range 09/07/13    ALT  Date Value Ref Range Status  02/28/2023 13 0 - 32 IU/L Final   SGPT (ALT)  Date Value Ref Range Status  09/14/2013 45 U/L Final    Comment:    14-63 NOTE: New Reference Range 09/07/13    AST  Date Value Ref Range Status  02/28/2023 21 0 - 40 IU/L Final   SGOT(AST)  Date Value Ref Range Status  09/14/2013 36 15 - 37 Unit/L Final   BUN  Date Value Ref Range Status  02/28/2023 15 8 - 27 mg/dL Final  25/36/6440 8 7 - 18 mg/dL Final   Calcium   Date Value Ref Range Status  02/28/2023 9.0 8.7 - 10.3 mg/dL Final   Calcium , Total  Date Value Ref Range Status  09/14/2013 8.8 8.5 - 10.1 mg/dL Final   CO2  Date Value Ref Range Status   02/28/2023 20 20 - 29 mmol/L Final   Co2  Date Value Ref Range Status  09/14/2013 24 21 - 32 mmol/L Final   Creatinine  Date Value Ref Range Status  09/14/2013 0.88 0.60 - 1.30 mg/dL Final   Creatinine, Ser  Date Value Ref Range Status  02/28/2023 1.15 (H) 0.57 - 1.00 mg/dL Final   Glucose  Date Value Ref Range Status  02/28/2023 87 70 - 99 mg/dL Final  34/74/2595 99 65 - 99 mg/dL Final   Glucose, Bld  Date Value Ref Range Status  10/03/2022 119 (H) 70 - 99 mg/dL Final    Comment:    Glucose reference range applies only to samples taken after fasting for at least 8 hours.   Potassium  Date Value Ref Range Status  02/28/2023 4.3 3.5 - 5.2 mmol/L Final  09/14/2013 3.7 3.5 - 5.1 mmol/L Final   Sodium  Date Value Ref Range Status  02/28/2023 138 134 - 144 mmol/L Final  09/14/2013 142 136 - 145 mmol/L Final   Bilirubin,Total  Date Value Ref Range Status  09/14/2013 0.4 0.2 - 1.0 mg/dL Final   Bilirubin Total  Date Value Ref Range Status  02/28/2023 0.5 0.0 - 1.2 mg/dL Final   Protein, ur  Date Value Ref Range Status  06/07/2022 NEGATIVE NEGATIVE mg/dL Final   Total Protein  Date Value Ref Range Status  02/28/2023 6.2 6.0 - 8.5 g/dL Final  63/87/5643 7.5 6.4 - 8.2 g/dL Final   EGFR (African American)  Date Value Ref Range Status  09/14/2013 >60  Final   GFR calc Af Amer  Date Value Ref Range Status  01/20/2020 56 (L) >59 mL/min/1.73 Final    Comment:    **In accordance with recommendations from the NKF-ASN Task force,**   [facility] is in the process of updating its eGFR calculation to the   2021 CKD-EPI creatinine equation that estimates kidney function   without a race variable.    eGFR  Date Value Ref Range Status  02/28/2023 52 (L) >59 mL/min/1.73 Final   EGFR (Non-African Amer.)  Date Value Ref Range  Status  09/14/2013 >60  Final    Comment:    eGFR values <4mL/min/1.73 m2 may be an indication of chronic kidney disease (CKD). Calculated eGFR  is useful in patients with stable renal function. The eGFR calculation will not be reliable in acutely ill patients when serum creatinine is changing rapidly. It is not useful in  patients on dialysis. The eGFR calculation may not be applicable to patients at the low and high extremes of body sizes, pregnant women, and vegetarians.    GFR, Estimated  Date Value Ref Range Status  10/03/2022 32 (L) >60 mL/min Final    Comment:    (NOTE) Calculated using the CKD-EPI Creatinine Equation (2021)          Refused Prescriptions Disp Refills   vortioxetine  HBr (TRINTELLIX ) 20 MG TABS tablet 90 tablet 1    Sig: Take 1 tablet (20 mg total) by mouth daily.     Psychiatry: Antidepressants - Serotonin Modulator Failed - 06/25/2023 11:34 AM      Failed - Valid encounter within last 6 months    Recent Outpatient Visits   None            Passed - Completed PHQ-2 or PHQ-9 in the last 360 days

## 2023-06-25 NOTE — Telephone Encounter (Signed)
 Refilled 06/23/23 duplicate request.  Requested Prescriptions  Pending Prescriptions Disp Refills   vortioxetine  HBr (TRINTELLIX ) 20 MG TABS tablet 90 tablet 1    Sig: Take 1 tablet (20 mg total) by mouth daily.     Psychiatry: Antidepressants - Serotonin Modulator Failed - 06/25/2023 11:33 AM      Failed - Valid encounter within last 6 months    Recent Outpatient Visits   None            Passed - Completed PHQ-2 or PHQ-9 in the last 360 days       QUEtiapine  (SEROQUEL  XR) 50 MG TB24 24 hr tablet 90 tablet 0    Sig: Take 1 tablet (50 mg total) by mouth at bedtime.     Not Delegated - Psychiatry:  Antipsychotics - Second Generation (Atypical) - quetiapine  Failed - 06/25/2023 11:33 AM      Failed - This refill cannot be delegated      Failed - TSH in normal range and within 360 days    TSH  Date Value Ref Range Status  06/07/2022 1.634 0.350 - 4.500 uIU/mL Final    Comment:    Performed by a 3rd Generation assay with a functional sensitivity of <=0.01 uIU/mL. Performed at [facility], [address] [place], [place] [address]   05/21/2022 0.251 (L) 0.450 - 4.500 uIU/mL Final         Failed - Valid encounter within last 6 months    Recent Outpatient Visits   None            Failed - Lipid Panel in normal range within the last 12 months    Cholesterol, Total  Date Value Ref Range Status  02/28/2023 173 100 - 199 mg/dL Final   LDL Chol Calc (NIH)  Date Value Ref Range Status  02/28/2023 76 0 - 99 mg/dL Final   HDL  Date Value Ref Range Status  02/28/2023 77 >39 mg/dL Final   Triglycerides  Date Value Ref Range Status  02/28/2023 114 0 - 149 mg/dL Final         Passed - Completed PHQ-2 or PHQ-9 in the last 360 days      Passed - Last BP in normal range    BP Readings from Last 1 Encounters:  03/07/23 128/85         Passed - Last Heart Rate in normal range    Pulse Readings from Last 1 Encounters:  03/07/23 85         Passed - CBC within  normal limits and completed in the last 12 months    WBC  Date Value Ref Range Status  02/28/2023 13.2 (H) 3.4 - 10.8 x10E3/uL Final  10/03/2022 13.1 (H) 4.0 - 10.5 K/uL Final   RBC  Date Value Ref Range Status  02/28/2023 4.32 3.77 - 5.28 x10E6/uL Final  10/03/2022 4.14 3.87 - 5.11 MIL/uL Final   Hemoglobin  Date Value Ref Range Status  02/28/2023 13.7 11.1 - 15.9 g/dL Final   Hematocrit  Date Value Ref Range Status  02/28/2023 40.5 34.0 - 46.6 % Final   MCHC  Date Value Ref Range Status  02/28/2023 33.8 31.5 - 35.7 g/dL Final  40/11/2723 36.6 30.0 - 36.0 g/dL Final   [facility]  Date Value Ref Range Status  02/28/2023 31.7 26.6 - 33.0 pg Final  10/03/2022 30.7 26.0 - 34.0 pg Final   MCV  Date Value Ref Range Status  02/28/2023 94 79 - 97 fL  Final  09/14/2013 94 80 - 100 fL Final   No results found for: "PLTCOUNTKUC", "LABPLAT", "POCPLA" RDW  Date Value Ref Range Status  02/28/2023 14.0 11.7 - 15.4 % Final  09/14/2013 13.3 11.5 - 14.5 % Final         Passed - CMP within normal limits and completed in the last 12 months    Albumin  Date Value Ref Range Status  02/28/2023 3.8 (L) 3.9 - 4.9 g/dL Final  40/11/2723 3.9 3.4 - 5.0 g/dL Final   Alkaline Phosphatase  Date Value Ref Range Status  02/28/2023 115 44 - 121 IU/L Final  09/14/2013 100 Unit/L Final    Comment:    46-116 NOTE: New Reference Range 09/07/13    ALT  Date Value Ref Range Status  02/28/2023 13 0 - 32 IU/L Final   SGPT (ALT)  Date Value Ref Range Status  09/14/2013 45 U/L Final    Comment:    14-63 NOTE: New Reference Range 09/07/13    AST  Date Value Ref Range Status  02/28/2023 21 0 - 40 IU/L Final   SGOT(AST)  Date Value Ref Range Status  09/14/2013 36 15 - 37 Unit/L Final   BUN  Date Value Ref Range Status  02/28/2023 15 8 - 27 mg/dL Final  36/64/4034 8 7 - 18 mg/dL Final   Calcium   Date Value Ref Range Status  02/28/2023 9.0 8.7 - 10.3 mg/dL Final   Calcium , Total   Date Value Ref Range Status  09/14/2013 8.8 8.5 - 10.1 mg/dL Final   CO2  Date Value Ref Range Status  02/28/2023 20 20 - 29 mmol/L Final   Co2  Date Value Ref Range Status  09/14/2013 24 21 - 32 mmol/L Final   Creatinine  Date Value Ref Range Status  09/14/2013 0.88 0.60 - 1.30 mg/dL Final   Creatinine, Ser  Date Value Ref Range Status  02/28/2023 1.15 (H) 0.57 - 1.00 mg/dL Final   Glucose  Date Value Ref Range Status  02/28/2023 87 70 - 99 mg/dL Final  74/25/9563 99 65 - 99 mg/dL Final   Glucose, Bld  Date Value Ref Range Status  10/03/2022 119 (H) 70 - 99 mg/dL Final    Comment:    Glucose reference range applies only to samples taken after fasting for at least 8 hours.   Potassium  Date Value Ref Range Status  02/28/2023 4.3 3.5 - 5.2 mmol/L Final  09/14/2013 3.7 3.5 - 5.1 mmol/L Final   Sodium  Date Value Ref Range Status  02/28/2023 138 134 - 144 mmol/L Final  09/14/2013 142 136 - 145 mmol/L Final   Bilirubin,Total  Date Value Ref Range Status  09/14/2013 0.4 0.2 - 1.0 mg/dL Final   Bilirubin Total  Date Value Ref Range Status  02/28/2023 0.5 0.0 - 1.2 mg/dL Final   Protein, ur  Date Value Ref Range Status  06/07/2022 NEGATIVE NEGATIVE mg/dL Final   Total Protein  Date Value Ref Range Status  02/28/2023 6.2 6.0 - 8.5 g/dL Final  87/56/4332 7.5 6.4 - 8.2 g/dL Final   EGFR (African American)  Date Value Ref Range Status  09/14/2013 >60  Final   GFR calc Af Amer  Date Value Ref Range Status  01/20/2020 56 (L) >59 mL/min/1.73 Final    Comment:    **In accordance with recommendations from the NKF-ASN Task force,**   [facility] is in the process of updating its eGFR calculation to the   2021 CKD-EPI creatinine  equation that estimates kidney function   without a race variable.    eGFR  Date Value Ref Range Status  02/28/2023 52 (L) >59 mL/min/1.73 Final   EGFR (Non-African Amer.)  Date Value Ref Range Status  09/14/2013 >60  Final     Comment:    eGFR values <40mL/min/1.73 m2 may be an indication of chronic kidney disease (CKD). Calculated eGFR is useful in patients with stable renal function. The eGFR calculation will not be reliable in acutely ill patients when serum creatinine is changing rapidly. It is not useful in  patients on dialysis. The eGFR calculation may not be applicable to patients at the low and high extremes of body sizes, pregnant women, and vegetarians.    GFR, Estimated  Date Value Ref Range Status  10/03/2022 32 (L) >60 mL/min Final    Comment:    (NOTE) Calculated using the CKD-EPI Creatinine Equation (2021)

## 2023-06-26 NOTE — Telephone Encounter (Signed)
 Duplicate request, refilled 06/25/23.  Requested Prescriptions  Pending Prescriptions Disp Refills   QUEtiapine  (SEROQUEL  XR) 50 MG TB24 24 hr tablet [Pharmacy Med Name: QUETIAPINE  FUMARATE ER 50 M 50 Tablet] 90 tablet 1    Sig: TAKE 1 TABLET (50 MG TOTAL) BY MOUTH AT BEDTIME.     Not Delegated - Psychiatry:  Antipsychotics - Second Generation (Atypical) - quetiapine  Failed - 06/26/2023  3:37 PM      Failed - This refill cannot be delegated      Failed - TSH in normal range and within 360 days    TSH  Date Value Ref Range Status  06/07/2022 1.634 0.350 - 4.500 uIU/mL Final    Comment:    Performed by a 3rd Generation assay with a functional sensitivity of <=0.01 uIU/mL. Performed at [facility] [place], [address] [place], [place] [address]   05/21/2022 0.251 (L) 0.450 - 4.500 uIU/mL Final         Failed - Valid encounter within last 6 months    Recent Outpatient Visits   None            Failed - Lipid Panel in normal range within the last 12 months    Cholesterol, Total  Date Value Ref Range Status  02/28/2023 173 100 - 199 mg/dL Final   LDL Chol Calc (NIH)  Date Value Ref Range Status  02/28/2023 76 0 - 99 mg/dL Final   HDL  Date Value Ref Range Status  02/28/2023 77 >39 mg/dL Final   Triglycerides  Date Value Ref Range Status  02/28/2023 114 0 - 149 mg/dL Final         Passed - Completed PHQ-2 or PHQ-9 in the last 360 days      Passed - Last BP in normal range    BP Readings from Last 1 Encounters:  03/07/23 128/85         Passed - Last Heart Rate in normal range    Pulse Readings from Last 1 Encounters:  03/07/23 85         Passed - CBC within normal limits and completed in the last 12 months    WBC  Date Value Ref Range Status  02/28/2023 13.2 (H) 3.4 - 10.8 x10E3/uL Final  10/03/2022 13.1 (H) 4.0 - 10.5 K/uL Final   RBC  Date Value Ref Range Status  02/28/2023 4.32 3.77 - 5.28 x10E6/uL Final  10/03/2022 4.14 3.87 - 5.11 MIL/uL Final    Hemoglobin  Date Value Ref Range Status  02/28/2023 13.7 11.1 - 15.9 g/dL Final   Hematocrit  Date Value Ref Range Status  02/28/2023 40.5 34.0 - 46.6 % Final   MCHC  Date Value Ref Range Status  02/28/2023 33.8 31.5 - 35.7 g/dL Final  60/45/4098 11.9 30.0 - 36.0 g/dL Final   [facility]  Date Value Ref Range Status  02/28/2023 31.7 26.6 - 33.0 pg Final  10/03/2022 30.7 26.0 - 34.0 pg Final   MCV  Date Value Ref Range Status  02/28/2023 94 79 - 97 fL Final  09/14/2013 94 80 - 100 fL Final   No results found for: "PLTCOUNTKUC", "LABPLAT", "POCPLA" RDW  Date Value Ref Range Status  02/28/2023 14.0 11.7 - 15.4 % Final  09/14/2013 13.3 11.5 - 14.5 % Final         Passed - CMP within normal limits and completed in the last 12 months    Albumin  Date Value Ref Range Status  02/28/2023 3.8 (L)  3.9 - 4.9 g/dL Final  16/11/9602 3.9 3.4 - 5.0 g/dL Final   Alkaline Phosphatase  Date Value Ref Range Status  02/28/2023 115 44 - 121 IU/L Final  09/14/2013 100 Unit/L Final    Comment:    46-116 NOTE: New Reference Range 09/07/13    ALT  Date Value Ref Range Status  02/28/2023 13 0 - 32 IU/L Final   SGPT (ALT)  Date Value Ref Range Status  09/14/2013 45 U/L Final    Comment:    14-63 NOTE: New Reference Range 09/07/13    AST  Date Value Ref Range Status  02/28/2023 21 0 - 40 IU/L Final   SGOT(AST)  Date Value Ref Range Status  09/14/2013 36 15 - 37 Unit/L Final   BUN  Date Value Ref Range Status  02/28/2023 15 8 - 27 mg/dL Final  54/10/8117 8 7 - 18 mg/dL Final   Calcium   Date Value Ref Range Status  02/28/2023 9.0 8.7 - 10.3 mg/dL Final   Calcium , Total  Date Value Ref Range Status  09/14/2013 8.8 8.5 - 10.1 mg/dL Final   CO2  Date Value Ref Range Status  02/28/2023 20 20 - 29 mmol/L Final   Co2  Date Value Ref Range Status  09/14/2013 24 21 - 32 mmol/L Final   Creatinine  Date Value Ref Range Status  09/14/2013 0.88 0.60 - 1.30 mg/dL Final    Creatinine, Ser  Date Value Ref Range Status  02/28/2023 1.15 (H) 0.57 - 1.00 mg/dL Final   Glucose  Date Value Ref Range Status  02/28/2023 87 70 - 99 mg/dL Final  14/78/2956 99 65 - 99 mg/dL Final   Glucose, Bld  Date Value Ref Range Status  10/03/2022 119 (H) 70 - 99 mg/dL Final    Comment:    Glucose reference range applies only to samples taken after fasting for at least 8 hours.   Potassium  Date Value Ref Range Status  02/28/2023 4.3 3.5 - 5.2 mmol/L Final  09/14/2013 3.7 3.5 - 5.1 mmol/L Final   Sodium  Date Value Ref Range Status  02/28/2023 138 134 - 144 mmol/L Final  09/14/2013 142 136 - 145 mmol/L Final   Bilirubin,Total  Date Value Ref Range Status  09/14/2013 0.4 0.2 - 1.0 mg/dL Final   Bilirubin Total  Date Value Ref Range Status  02/28/2023 0.5 0.0 - 1.2 mg/dL Final   Protein, ur  Date Value Ref Range Status  06/07/2022 NEGATIVE NEGATIVE mg/dL Final   Total Protein  Date Value Ref Range Status  02/28/2023 6.2 6.0 - 8.5 g/dL Final  21/30/8657 7.5 6.4 - 8.2 g/dL Final   EGFR (African American)  Date Value Ref Range Status  09/14/2013 >60  Final   GFR calc Af Amer  Date Value Ref Range Status  01/20/2020 56 (L) >59 mL/min/1.73 Final    Comment:    **In accordance with recommendations from the NKF-ASN Task force,**   [facility] is in the process of updating its eGFR calculation to the   2021 CKD-EPI creatinine equation that estimates kidney function   without a race variable.    eGFR  Date Value Ref Range Status  02/28/2023 52 (L) >59 mL/min/1.73 Final   EGFR (Non-African Amer.)  Date Value Ref Range Status  09/14/2013 >60  Final    Comment:    eGFR values <2mL/min/1.73 m2 may be an indication of chronic kidney disease (CKD). Calculated eGFR is useful in patients with stable renal function. The eGFR  calculation will not be reliable in acutely ill patients when serum creatinine is changing rapidly. It is not useful in  patients on  dialysis. The eGFR calculation may not be applicable to patients at the low and high extremes of body sizes, pregnant women, and vegetarians.    GFR, Estimated  Date Value Ref Range Status  10/03/2022 32 (L) >60 mL/min Final    Comment:    (NOTE) Calculated using the CKD-EPI Creatinine Equation (2021)

## 2023-07-08 DIAGNOSIS — M5135 Other intervertebral disc degeneration, thoracolumbar region: Secondary | ICD-10-CM | POA: Diagnosis not present

## 2023-07-08 DIAGNOSIS — M169 Osteoarthritis of hip, unspecified: Secondary | ICD-10-CM | POA: Diagnosis not present

## 2023-07-08 DIAGNOSIS — Z79891 Long term (current) use of opiate analgesic: Secondary | ICD-10-CM | POA: Diagnosis not present

## 2023-07-08 DIAGNOSIS — G894 Chronic pain syndrome: Secondary | ICD-10-CM | POA: Diagnosis not present

## 2023-07-16 ENCOUNTER — Encounter: Payer: Self-pay | Admitting: Nurse Practitioner

## 2023-07-16 ENCOUNTER — Ambulatory Visit (INDEPENDENT_AMBULATORY_CARE_PROVIDER_SITE_OTHER): Admitting: Nurse Practitioner

## 2023-07-16 VITALS — BP 148/82 | Temp 98.4°F | Resp 14 | Ht 64.57 in | Wt 144.0 lb

## 2023-07-16 DIAGNOSIS — G894 Chronic pain syndrome: Secondary | ICD-10-CM

## 2023-07-16 DIAGNOSIS — I110 Hypertensive heart disease with heart failure: Secondary | ICD-10-CM

## 2023-07-16 DIAGNOSIS — Z0000 Encounter for general adult medical examination without abnormal findings: Secondary | ICD-10-CM

## 2023-07-16 DIAGNOSIS — N184 Chronic kidney disease, stage 4 (severe): Secondary | ICD-10-CM | POA: Diagnosis not present

## 2023-07-16 DIAGNOSIS — E782 Mixed hyperlipidemia: Secondary | ICD-10-CM

## 2023-07-16 DIAGNOSIS — Z7189 Other specified counseling: Secondary | ICD-10-CM | POA: Diagnosis not present

## 2023-07-16 DIAGNOSIS — J449 Chronic obstructive pulmonary disease, unspecified: Secondary | ICD-10-CM | POA: Diagnosis not present

## 2023-07-16 DIAGNOSIS — F331 Major depressive disorder, recurrent, moderate: Secondary | ICD-10-CM | POA: Diagnosis not present

## 2023-07-16 DIAGNOSIS — R7303 Prediabetes: Secondary | ICD-10-CM

## 2023-07-16 MED ORDER — QUETIAPINE FUMARATE 100 MG PO TABS: 100.0000 mg | ORAL_TABLET | Freq: Every day | ORAL | 1 refills | Status: DC

## 2023-07-16 MED ORDER — LOSARTAN POTASSIUM 50 MG PO TABS: 50.0000 mg | ORAL_TABLET | Freq: Every day | ORAL | 1 refills | Status: AC

## 2023-07-16 NOTE — Assessment & Plan Note (Signed)
 Chronic.  Controlled.  Continue with current medication regimen of Symbicort .  Recommend daily use.  Labs ordered today.  Return to clinic in 6 months for reevaluation.  Call sooner if concerns arise.

## 2023-07-16 NOTE — Progress Notes (Signed)
 Subjective:   Amy Rocha is a 68 y.o. female who presents for Medicare Annual (Subsequent) preventive examination.  Visit Complete: In person  Patient Medicare AWV questionnaire was completed by the patient on 07/16/2023; I have confirmed that all information answered by patient is correct and no changes since this date.  Cardiac Risk Factors include: advanced age (>46men, >42 women);hypertension;sedentary lifestyle     Objective:     Today's Vitals   07/16/23 1334 07/16/23 1345  BP: (!) 149/79 (!) 148/82  Resp: 14   Temp: 98.4 F (36.9 C)   TempSrc: Oral   SpO2: 97%   Weight: 144 lb (65.3 kg)   Height: 5' 4.57" (1.64 m)   PainSc: 6     Body mass index is 24.29 kg/m.     07/16/2023    1:37 PM 10/03/2022    6:31 PM 06/07/2022    5:47 PM 04/29/2022    1:12 PM 11/30/2021   10:13 AM 10/14/2021    6:13 PM 01/16/2021    9:16 AM  Advanced Directives  Does Patient Have a Medical Advance Directive? No No No No No No No  Would patient like information on creating a medical advance directive? No - Patient declined No - Patient declined No - Patient declined No - Patient declined No - Patient declined No - Patient declined No - Patient declined    Current Medications (verified) Outpatient Encounter Medications as of 07/16/2023  Medication Sig   acetaminophen  (TYLENOL ) 500 MG tablet Take 1,000 mg by mouth 2 (two) times daily as needed for moderate pain or headache.    albuterol  (VENTOLIN  HFA) 108 (90 Base) MCG/ACT inhaler Inhale 2 puffs into the lungs every 6 (six) hours as needed for wheezing or shortness of breath.   amLODipine  (NORVASC ) 2.5 MG tablet Take 1 tablet (2.5 mg total) by mouth daily.   atorvastatin  (LIPITOR) 40 MG tablet TAKE 1 TABLET (40 MG TOTAL) BY MOUTH AT BEDTIME.   brompheniramine-pseudoephedrine-DM (BROMFED DM) 30-2-10 MG/5ML syrup Take 5 mLs by mouth 4 (four) times daily as needed.   budesonide -formoterol  (SYMBICORT ) 160-4.5 MCG/ACT inhaler Inhale 2  puffs into the lungs 2 (two) times daily as needed (shortness of breath).   buPROPion  (WELLBUTRIN  XL) 150 MG 24 hr tablet Take 1 tablet (150 mg total) by mouth daily.   buPROPion  (WELLBUTRIN  XL) 300 MG 24 hr tablet Take 1 tablet (300 mg total) by mouth every morning.   cromolyn  (OPTICROM ) 4 % ophthalmic solution Place 1 drop into both eyes 4 (four) times daily.   losartan  (COZAAR ) 25 MG tablet Take 1 tablet (25 mg total) by mouth daily.   montelukast  (SINGULAIR ) 10 MG tablet TAKE 1 TABLET (10 MG TOTAL) BY MOUTH AT BEDTIME.   NARCAN  4 MG/0.1ML LIQD nasal spray kit Place 1 spray into the nose once.   Olopatadine-Mometasone  (RYALTRIS ) 665-25 MCG/ACT SUSP Place 2 puffs into the nose in the morning and at bedtime.   omeprazole  (PRILOSEC) 20 MG capsule TAKE 1 CAPSULE BY MOUTH DAILY.   ondansetron  (ZOFRAN -ODT) 4 MG disintegrating tablet DISSOLVE 1 TABLET ON TONGUE EVERY 8 HOURS AS NEEDED FOR NAUSEA OR VOMITING.   Oxycodone  HCl 20 MG TABS Take 1 tablet (20 mg total) by mouth every 4 (four) hours as needed (pain). Takes 5 daily.   QUEtiapine  (SEROQUEL  XR) 50 MG TB24 24 hr tablet Take 1 tablet (50 mg total) by mouth at bedtime.   rOPINIRole  (REQUIP ) 0.5 MG tablet Take 1 tablet (0.5 mg total) by mouth at  bedtime as needed.   tiZANidine (ZANAFLEX) 2 MG tablet    torsemide (DEMADEX) 20 MG tablet Take 20 mg by mouth daily.   valACYclovir  (VALTREX ) 500 MG tablet TAKE 4 TABLETS BY MOUTH 2 TIMES DAILY AS NEEDED.   Vitamin D , Ergocalciferol , (DRISDOL ) 1.25 MG (50000 UNIT) CAPS capsule Take 1 capsule (50,000 Units total) by mouth every 7 (seven) days.   vortioxetine  HBr (TRINTELLIX ) 20 MG TABS tablet TAKE 1 TABLET (20 MG TOTAL) BY MOUTH DAILY.   No facility-administered encounter medications on file as of 07/16/2023.    Allergies (verified) Fluticasone -salmeterol, Lactose intolerance (gi), and Tetracyclines & related   History: Past Medical History:  Diagnosis Date   Anxiety    Arthritis    Asthma     Avascular necrosis (HCC)    Blood transfusion without reported diagnosis    Calculus of kidney    CHF (congestive heart failure) (HCC)    Chronic pain syndrome    Complication of anesthesia    aspirated in recovery after  hip replacement 1998   COPD (chronic obstructive pulmonary disease) (HCC)    Depression    GERD (gastroesophageal reflux disease)    Heart failure (HCC)    History of kidney stones    Hyperlipidemia    Hypertension    IBS (irritable bowel syndrome)    Joint pain    Osteoporosis    Oxygen  deficiency    Peripheral vascular disease (HCC)    Pneumonia due to infectious organism 02/05/2022   PONV (postoperative nausea and vomiting)    Urge incontinence    Past Surgical History:  Procedure Laterality Date   avascular necrosis     CARDIAC CATHETERIZATION Left 12/01/2015   Procedure: Left Heart Cath and Coronary Angiography;  Surgeon: Ronney Cola, MD;  Location: [facility] INVASIVE CV LAB;  Service: Cardiovascular;  Laterality: Left;   COLONOSCOPY WITH PROPOFOL  N/A 10/08/2019   Procedure: COLONOSCOPY WITH PROPOFOL ;  Surgeon: Luke Salaam, MD;  Location: [facility] ENDOSCOPY;  Service: Gastroenterology;  Laterality: N/A;   ESOPHAGOGASTRODUODENOSCOPY (EGD) WITH PROPOFOL  N/A 11/08/2019   Procedure: ESOPHAGOGASTRODUODENOSCOPY (EGD) WITH PROPOFOL ;  Surgeon: Luke Salaam, MD;  Location: [facility] ENDOSCOPY;  Service: Gastroenterology;  Laterality: N/A;   eyelid lift     HARDWARE REMOVAL Left 04/27/2020   Procedure: Left hip, deep hardware removal;  Surgeon: Molli Angelucci, MD;  Location: [facility] ORS;  Service: Orthopedics;  Laterality: Left;   HERNIA REPAIR     INTRAMEDULLARY (IM) NAIL INTERTROCHANTERIC Left 10/14/2017   Procedure: REPAIR OF LEFT GREATER TROCHANTER NONUNION;  Surgeon: Molli Angelucci, MD;  Location: [facility] ORS;  Service: Orthopedics;  Laterality: Left;   JOINT REPLACEMENT     hip   NASAL SINUS SURGERY     TOTAL ABDOMINAL HYSTERECTOMY     TOTAL HIP REVISION Left 01/21/2017    Procedure: TOTAL HIP REVISION FEMORAL STEM WITH POLYETHYLENE EXCHANGE WITH CUPLINER;  Surgeon: Molli Angelucci, MD;  Location: [facility] ORS;  Service: Orthopedics;  Laterality: Left;   Family History  Problem Relation Age of Onset   Dementia Mother    Cancer Father        colon   Arthritis Brother    Hypertension Daughter    Aneurysm Brother    Social History   Socioeconomic History   Marital status: Unknown    Spouse name: Not on file   Number of children: Not on file   Years of education: Not on file   Highest education level: 12th grade  Occupational History   Occupation: disabled  Tobacco Use   Smoking status: Former   Smokeless tobacco: Never  Vaping Use   Vaping status: Some Days   Substances: Flavoring  Substance and Sexual Activity   Alcohol use: Yes    Comment: shots of liquor for pain   Drug use: Yes    Comment: prescribed oxycodone    Sexual activity: Yes  Other Topics Concern   Not on file  Social History Narrative   ** Merged History Encounter **       Social Drivers of Health   Financial Resource Strain: Low Risk  (04/29/2022)   Overall Financial Resource Strain (CARDIA)    Difficulty of Paying Living Expenses: Not hard at all  Food Insecurity: No Food Insecurity (10/08/2022)   Hunger Vital Sign    Worried About Running Out of Food in the Last Year: Never true    Ran Out of Food in the Last Year: Never true  Transportation Needs: No Transportation Needs (10/08/2022)   PRAPARE - Administrator, Civil Service (Medical): No    Lack of Transportation (Non-Medical): No  Physical Activity: Insufficiently Active (04/29/2022)   Exercise Vital Sign    Days of Exercise per Week: 2 days    Minutes of Exercise per Session: 20 min  Stress: No Stress Concern Present (04/29/2022)   [employer] of Occupational Health - Occupational Stress Questionnaire    Feeling of Stress : Only a little  Social Connections: Moderately Isolated (04/29/2022)   Social  Connection and Isolation Panel [NHANES]    Frequency of Communication with Friends and Family: More than three times a week    Frequency of Social Gatherings with Friends and Family: Three times a week    Attends Religious Services: Never    Active Member of Clubs or Organizations: No    Attends Banker Meetings: Never    Marital Status: Married    Tobacco Counseling Counseling given: Not Answered   Clinical Intake:  Pre-visit preparation completed: No  Pain : 0-10 Pain Score: 6  Pain Type: Chronic pain Pain Location: Hip Pain Orientation: Right, Left Pain Radiating Towards: No Pain Descriptors / Indicators: Aching, Dull Pain Onset: More than a month ago Pain Frequency: Constant Pain Relieving Factors: laying down and take medications. Effect of Pain on Daily Activities: no  Pain Relieving Factors: laying down and take medications.  Diabetes: No  How often do you need to have someone help you when you read instructions, pamphlets, or other written materials from your doctor or pharmacy?: 1 - Never  Interpreter Needed?: No      Activities of Daily Living    07/16/2023    1:35 PM  In your present state of health, do you have any difficulty performing the following activities:  Hearing? 0  Vision? 1  Comment Reading and uses readers.  Difficulty concentrating or making decisions? 1  Comment Sometimes forgets.  Walking or climbing stairs? 1  Comment Due to hip pain  Dressing or bathing? 0  Doing errands, shopping? 0  Preparing Food and eating ? N  Using the Toilet? N  In the past six months, have you accidently leaked urine? Y  Comment Wears pads  Do you have problems with loss of bowel control? N  Managing your Medications? N  Managing your Finances? N  Housekeeping or managing your Housekeeping? N    Patient Care Team: Aileen Alexanders, NP as PCP - General (Nurse Practitioner)  Indicate any recent Medical Services you may have received  from other than [facility] providers in the past year (date may be approximate).     Assessment:    This is a routine wellness examination for Amy Rocha.  Hearing/Vision screen No results found.   Goals Addressed             This Visit's Progress    DIET - EAT MORE FRUITS AND VEGETABLES   On track    Increase physical activity   Not on track    Increase water intake   Not on track    Recommend drinking at least 5-6 glasses of water a day        Depression Screen    03/07/2023   10:27 AM 02/28/2023   10:35 AM 11/28/2022    1:32 PM 08/19/2022   10:15 AM 07/17/2022    1:19 PM 06/18/2022   10:34 AM 06/17/2022   10:14 AM  PHQ 2/9 Scores  PHQ - 2 Score 4 2 2 1 2 2 2   PHQ- 9 Score 8 11 5 3 4 8 7     Fall Risk    07/16/2023    1:33 PM 03/07/2023   10:27 AM 11/28/2022    1:32 PM 08/19/2022   10:14 AM 07/17/2022    1:18 PM  Fall Risk   Falls in the past year? 1 1 0 1 1  Number falls in past yr: 1 1 0 1 0  Injury with Fall? 0 1 0 0 0  Risk for fall due to : History of fall(s) History of fall(s) No Fall Risks No Fall Risks;History of fall(s)   Follow up Falls evaluation completed Falls evaluation completed Falls evaluation completed Falls evaluation completed Falls evaluation completed    MEDICARE RISK AT HOME: Medicare Risk at Home Any stairs in or around the home?: No If so, are there any without handrails?: No Home free of loose throw rugs in walkways, pet beds, electrical cords, etc?: Yes Adequate lighting in your home to reduce risk of falls?: Yes Life alert?: No Use of a cane, walker or w/c?: No Grab bars in the bathroom?: Yes Shower chair or bench in shower?: Yes Elevated toilet seat or a handicapped toilet?: No  TIMED UP AND GO:  Was the test performed?  Yes  Length of time to ambulate 10 feet: 8 sec Gait slow and steady without use of assistive device    Cognitive Function:        07/16/2023    1:38 PM 04/29/2022    1:19 PM 04/02/2018    1:29 PM 09/12/2016    2:00  PM  6CIT Screen  What Year? 0 points 0 points 0 points 0 points  What month? 0 points 0 points 0 points 0 points  What time? 0 points 0 points 0 points 0 points  Count back from 20 0 points 0 points 0 points 0 points  Months in reverse 0 points 0 points 0 points 0 points  Repeat phrase 2 points 0 points 0 points 0 points  Total Score 2 points 0 points 0 points 0 points    Immunizations Immunization History  Administered Date(s) Administered   Fluad Quad(high Dose 65+) 11/25/2018, 11/27/2019, 12/21/2020, 11/08/2021   Fluad Trivalent(High Dose 65+) 11/28/2022   Influenza,inj,Quad PF,6+ Mos 01/22/2017, 12/16/2017   Influenza-Unspecified 10/29/2011, 12/30/2012, 12/02/2013, 01/30/2015, 11/22/2015   PFIZER(Purple Top)SARS-COV-2 Vaccination 03/12/2019, 04/01/2019, 12/06/2019   PPD Test 01/24/2017   Pfizer(Comirnaty)Fall Seasonal Vaccine 12 years and older 11/28/2022   Pneumococcal Conjugate-13 09/24/2018   Pneumococcal Polysaccharide-23 11/10/2009,  01/22/2017, 11/08/2021   Td 04/12/2008, 09/24/2018   Zoster Recombinant(Shingrix) 01/06/2018, 09/04/2018, 09/15/2018   Zoster, Live 06/28/2015, 09/04/2018    TDAP status: Up to date  Flu Vaccine status: Up to date  Pneumococcal vaccine status: Up to date  Covid-19 vaccine status: Completed vaccines  Qualifies for Shingles Vaccine? Yes   Zostavax completed No   Shingrix Completed?: Yes  Screening Tests Health Maintenance  Topic Date Due   Medicare Annual Wellness (AWV)  04/29/2023   COVID-19 Vaccine (5 - Pfizer risk 2024-25 season) 08/01/2023 (Originally 05/29/2023)   INFLUENZA VACCINE  09/19/2023   Colonoscopy  10/07/2024   MAMMOGRAM  04/23/2025   DTaP/Tdap/Td (3 - Tdap) 09/23/2028   Pneumonia Vaccine 23+ Years old  Completed   DEXA SCAN  Completed   Hepatitis C Screening  Completed   Zoster Vaccines- Shingrix  Completed   HPV VACCINES  Aged Out   Meningococcal B Vaccine  Aged Out    Health Maintenance  Health Maintenance  Due  Topic Date Due   Medicare Annual Wellness (AWV)  04/29/2023    Colorectal cancer screening: Type of screening: Colonoscopy. Completed 10/08/2019. Repeat every 5 years  Mammogram status: Completed 04/24/2023. Repeat every year  Bone Density status: Completed 09/29/2012. Results reflect: Bone density results: NORMAL. Repeat every 10 years.  Lung Cancer Screening: (Low Dose CT Chest recommended if Age 34-80 years, 20 pack-year currently smoking OR have quit w/in 15years.) does not qualify.    Additional Screening:  Hepatitis C Screening: does qualify; Completed 09/06/2015  Vision Screening: Recommended annual ophthalmology exams for early detection of glaucoma and other disorders of the eye. Is the patient up to date with their annual eye exam?  Yes   Dental Screening: Recommended annual dental exams for proper oral hygiene  Diabetic Foot Exam: N/A  Community Resource Referral / Chronic Care Management: CRR required this visit?  No   CCM required this visit?  No     Plan:     I have personally reviewed and noted the following in the patient's chart:   Medical and social history Use of alcohol, tobacco or illicit drugs  Current medications and supplements including opioid prescriptions. Patient is currently taking opioid prescriptions. Information provided to patient regarding non-opioid alternatives. Patient advised to discuss non-opioid treatment plan with their provider. Functional ability and status Nutritional status Physical activity Advanced directives List of other physicians Hospitalizations, surgeries, and ER visits in previous 12 months Vitals Screenings to include cognitive, depression, and falls Referrals and appointments  In addition, I have reviewed and discussed with patient certain preventive protocols, quality metrics, and best practice recommendations. A written personalized care plan for preventive services as well as general preventive health  recommendations were provided to patient.     Amy Rocha, CMA   07/16/2023   After Visit Summary: (In Person-Printed) AVS printed and given to the patient  Nurse Notes:

## 2023-07-16 NOTE — Assessment & Plan Note (Signed)
A voluntary discussion about advance care planning including the explanation and discussion of advance directives was extensively discussed  with the patient for 5 minutes with patient.  Explanation about the health care proxy and Living will was reviewed and packet with forms with explanation of how to fill them out was given.   Patient was offered a separate Advance Care Planning visit for further assistance with forms.    

## 2023-07-16 NOTE — Assessment & Plan Note (Signed)
 Chronic.  Controlled.  Continue with current medication regimen.  Labs ordered today.  Return to clinic in 6 months for reevaluation.  Call sooner if concerns arise.  ? ?

## 2023-07-16 NOTE — Patient Instructions (Signed)
 Please bring your blood pressure monitor with you next visit to be checked for accuracy.

## 2023-07-16 NOTE — Assessment & Plan Note (Signed)
 Chronic pain.  On Oxycodone .  Followed by [facility].  Reviewed most recent note.

## 2023-07-16 NOTE — Assessment & Plan Note (Signed)
 Chronic. Not well controlled.  Will increase Seroquel  100mg  nightly.  Recommend continuing Trintellix  and Wellbutrin .  Follow up in 3 months.  Call sooner if concerns arise.

## 2023-07-16 NOTE — Progress Notes (Signed)
 BP (!) 148/82 (BP Location: Right Arm, Patient Position: Sitting, Cuff Size: Normal)   Temp 98.4 F (36.9 C) (Oral)   Resp 14   Ht 5' 4.57" (1.64 m)   Wt 144 lb (65.3 kg)   LMP 09/13/1975 (Approximate)   SpO2 97%   BMI 24.29 kg/m    Subjective:    Patient ID: Amy Rocha, female    DOB: 1955/09/12, 68 y.o.   MRN: [id number]  HPI: Amy Rocha is a 68 y.o. female presenting on 07/16/2023 for comprehensive medical examination. Current medical complaints include: mood  She currently lives with: Menopausal Symptoms: no  HYPERTENSION / HYPERLIPIDEMIA Satisfied with current treatment? yes Duration of hypertension: years BP monitoring frequency: not checking BP range:  BP medication side effects: no Past BP meds: losartan  (cozaar ) Duration of hyperlipidemia: years Cholesterol medication side effects: no Cholesterol supplements: none Past cholesterol medications: atorvastain (lipitor) Medication compliance: excellent compliance Aspirin : no Recent stressors: no Recurrent headaches: no Visual changes: no Palpitations: no Dyspnea: no Chest pain: no Lower extremity edema: no Dizzy/lightheaded: no  CHRONIC KIDNEY DISEASE CKD status: controlled Medications renally dose: yes Previous renal evaluation: no Pneumovax:  Up to Date Influenza Vaccine:  Up to Date  COPD On Symbicort - not using it everyday.  COPD status: controlled Satisfied with current treatment?: yes Oxygen  use: no Dyspnea frequency:  Cough frequency:  Rescue inhaler frequency:   Limitation of activity: no Productive cough:  Last Spirometry:  Pneumovax: Up to Date Influenza: Up to Date   DEPRESSION Patient states she feels like she has been okay.  She feels like she been down a little bit this last month after her husband was in a car accident.  Her back started hurting yesterday and that has her down today. She feels like this is not a good medication regimen for her.  She isn't sleeping well  at all.  Continues on Trintellix , Wellbutrin , seroquel .  She would be okay with increasing the Seroquel .  Depression Screen done today and results listed below:     03/07/2023   10:27 AM 02/28/2023   10:35 AM 11/28/2022    1:32 PM 08/19/2022   10:15 AM 07/17/2022    1:19 PM  Depression screen PHQ 2/9  Decreased Interest 2 1 1 1 1   Down, Depressed, Hopeless 2 1 1  0 1  PHQ - 2 Score 4 2 2 1 2   Altered sleeping 1 3 1 1 1   Tired, decreased energy 3 3 1 1 1   Change in appetite 0 3 0 0 0  Feeling bad or failure about yourself  0 0 0 0 0  Trouble concentrating 0 0 1 0 0  Moving slowly or fidgety/restless 0 0 0 0 0  Suicidal thoughts 0 0 0 0 0  PHQ-9 Score 8 11 5 3 4   Difficult doing work/chores Not difficult at all  Somewhat difficult Somewhat difficult Not difficult at all      03/07/2023   10:27 AM 02/28/2023   10:35 AM 11/28/2022    1:32 PM 08/19/2022   10:15 AM  GAD 7 : Generalized Anxiety Score  Nervous, Anxious, on Edge 0 0 0 1  Control/stop worrying 0 0 0 0  Worry too much - different things 0 0 1 0  Trouble relaxing 0 0 1 0  Restless 0 0 0 0  Easily annoyed or irritable 0 0 0 0  Afraid - awful might happen 0 0 0 0  Total GAD 7 Score 0 0  2 1  Anxiety Difficulty Not difficult at all  Somewhat difficult Not difficult at all     The patient does not have a history of falls. I did complete a risk assessment for falls. A plan of care for falls was documented.   Past Medical History:  Past Medical History:  Diagnosis Date   Anxiety    Arthritis    Asthma    Avascular necrosis (HCC)    Blood transfusion without reported diagnosis    Calculus of kidney    CHF (congestive heart failure) (HCC)    Chronic pain syndrome    Complication of anesthesia    aspirated in recovery after  hip replacement 1998   COPD (chronic obstructive pulmonary disease) (HCC)    Depression    GERD (gastroesophageal reflux disease)    Heart failure (HCC)    History of kidney stones     Hyperlipidemia    Hypertension    IBS (irritable bowel syndrome)    Joint pain    Osteoporosis    Oxygen  deficiency    Peripheral vascular disease (HCC)    Pneumonia due to infectious organism 02/05/2022   PONV (postoperative nausea and vomiting)    Urge incontinence     Surgical History:  Past Surgical History:  Procedure Laterality Date   avascular necrosis     CARDIAC CATHETERIZATION Left 12/01/2015   Procedure: Left Heart Cath and Coronary Angiography;  Surgeon: Ronney Cola, MD;  Location: [facility] INVASIVE CV LAB;  Service: Cardiovascular;  Laterality: Left;   COLONOSCOPY WITH PROPOFOL  N/A 10/08/2019   Procedure: COLONOSCOPY WITH PROPOFOL ;  Surgeon: Luke Salaam, MD;  Location: [facility] ENDOSCOPY;  Service: Gastroenterology;  Laterality: N/A;   ESOPHAGOGASTRODUODENOSCOPY (EGD) WITH PROPOFOL  N/A 11/08/2019   Procedure: ESOPHAGOGASTRODUODENOSCOPY (EGD) WITH PROPOFOL ;  Surgeon: Luke Salaam, MD;  Location: [facility] ENDOSCOPY;  Service: Gastroenterology;  Laterality: N/A;   eyelid lift     HARDWARE REMOVAL Left 04/27/2020   Procedure: Left hip, deep hardware removal;  Surgeon: Molli Angelucci, MD;  Location: [facility] ORS;  Service: Orthopedics;  Laterality: Left;   HERNIA REPAIR     INTRAMEDULLARY (IM) NAIL INTERTROCHANTERIC Left 10/14/2017   Procedure: REPAIR OF LEFT GREATER TROCHANTER NONUNION;  Surgeon: Molli Angelucci, MD;  Location: [facility] ORS;  Service: Orthopedics;  Laterality: Left;   JOINT REPLACEMENT     hip   NASAL SINUS SURGERY     TOTAL ABDOMINAL HYSTERECTOMY     TOTAL HIP REVISION Left 01/21/2017   Procedure: TOTAL HIP REVISION FEMORAL STEM WITH POLYETHYLENE EXCHANGE WITH CUPLINER;  Surgeon: Molli Angelucci, MD;  Location: [facility] ORS;  Service: Orthopedics;  Laterality: Left;    Medications:  Current Outpatient Medications on File Prior to Visit  Medication Sig   acetaminophen  (TYLENOL ) 500 MG tablet Take 1,000 mg by mouth 2 (two) times daily as needed for moderate pain or headache.     albuterol  (VENTOLIN  HFA) 108 (90 Base) MCG/ACT inhaler Inhale 2 puffs into the lungs every 6 (six) hours as needed for wheezing or shortness of breath.   amLODipine  (NORVASC ) 2.5 MG tablet Take 1 tablet (2.5 mg total) by mouth daily.   atorvastatin  (LIPITOR) 40 MG tablet TAKE 1 TABLET (40 MG TOTAL) BY MOUTH AT BEDTIME.   brompheniramine-pseudoephedrine-DM (BROMFED DM) 30-2-10 MG/5ML syrup Take 5 mLs by mouth 4 (four) times daily as needed.   budesonide -formoterol  (SYMBICORT ) 160-4.5 MCG/ACT inhaler Inhale 2 puffs into the lungs 2 (two) times daily as needed (shortness of breath).   buPROPion  (WELLBUTRIN  XL) 150  MG 24 hr tablet Take 1 tablet (150 mg total) by mouth daily.   buPROPion  (WELLBUTRIN  XL) 300 MG 24 hr tablet Take 1 tablet (300 mg total) by mouth every morning.   cromolyn  (OPTICROM ) 4 % ophthalmic solution Place 1 drop into both eyes 4 (four) times daily.   montelukast  (SINGULAIR ) 10 MG tablet TAKE 1 TABLET (10 MG TOTAL) BY MOUTH AT BEDTIME.   NARCAN  4 MG/0.1ML LIQD nasal spray kit Place 1 spray into the nose once.   Olopatadine-Mometasone  (RYALTRIS ) 665-25 MCG/ACT SUSP Place 2 puffs into the nose in the morning and at bedtime.   omeprazole  (PRILOSEC) 20 MG capsule TAKE 1 CAPSULE BY MOUTH DAILY.   ondansetron  (ZOFRAN -ODT) 4 MG disintegrating tablet DISSOLVE 1 TABLET ON TONGUE EVERY 8 HOURS AS NEEDED FOR NAUSEA OR VOMITING.   Oxycodone  HCl 20 MG TABS Take 1 tablet (20 mg total) by mouth every 4 (four) hours as needed (pain). Takes 5 daily.   rOPINIRole  (REQUIP ) 0.5 MG tablet Take 1 tablet (0.5 mg total) by mouth at bedtime as needed.   tiZANidine (ZANAFLEX) 2 MG tablet    torsemide (DEMADEX) 20 MG tablet Take 20 mg by mouth daily.   valACYclovir  (VALTREX ) 500 MG tablet TAKE 4 TABLETS BY MOUTH 2 TIMES DAILY AS NEEDED.   Vitamin D , Ergocalciferol , (DRISDOL ) 1.25 MG (50000 UNIT) CAPS capsule Take 1 capsule (50,000 Units total) by mouth every 7 (seven) days.   vortioxetine  HBr (TRINTELLIX ) 20  MG TABS tablet TAKE 1 TABLET (20 MG TOTAL) BY MOUTH DAILY.   No current facility-administered medications on file prior to visit.    Allergies:  Allergies  Allergen Reactions   Fluticasone -Salmeterol Swelling and Other (See Comments)    Tongue sores   Lactose Intolerance (Gi) Other (See Comments)    Bloating, GI upset, diarrhea   Tetracyclines & Related Other (See Comments)    Went in to the sun and broke out    Social History:  Social History   Socioeconomic History   Marital status: Unknown    Spouse name: Not on file   Number of children: Not on file   Years of education: Not on file   Highest education level: 12th grade  Occupational History   Occupation: disabled   Tobacco Use   Smoking status: Former   Smokeless tobacco: Never  Advertising account planner   Vaping status: Some Days   Substances: Flavoring  Substance and Sexual Activity   Alcohol use: Yes    Comment: shots of liquor for pain   Drug use: Yes    Comment: prescribed oxycodone    Sexual activity: Yes  Other Topics Concern   Not on file  Social History Narrative   ** Merged History Encounter **       Social Drivers of Health   Financial Resource Strain: Low Risk  (04/29/2022)   Overall Financial Resource Strain (CARDIA)    Difficulty of Paying Living Expenses: Not hard at all  Food Insecurity: No Food Insecurity (10/08/2022)   Hunger Vital Sign    Worried About Running Out of Food in the Last Year: Never true    Ran Out of Food in the Last Year: Never true  Transportation Needs: No Transportation Needs (10/08/2022)   PRAPARE - Administrator, Civil Service (Medical): No    Lack of Transportation (Non-Medical): No  Physical Activity: Insufficiently Active (04/29/2022)   Exercise Vital Sign    Days of Exercise per Week: 2 days    Minutes of Exercise  per Session: 20 min  Stress: No Stress Concern Present (04/29/2022)   [employer] of Occupational Health - Occupational Stress Questionnaire     Feeling of Stress : Only a little  Social Connections: Moderately Isolated (04/29/2022)   Social Connection and Isolation Panel [NHANES]    Frequency of Communication with Friends and Family: More than three times a week    Frequency of Social Gatherings with Friends and Family: Three times a week    Attends Religious Services: Never    Active Member of Clubs or Organizations: No    Attends Banker Meetings: Never    Marital Status: Married  Catering manager Violence: Not At Risk (04/29/2022)   Humiliation, Afraid, Rape, and Kick questionnaire    Fear of Current or Ex-Partner: No    Emotionally Abused: No    Physically Abused: No    Sexually Abused: No   Social History   Tobacco Use  Smoking Status Former  Smokeless Tobacco Never   Social History   Substance and Sexual Activity  Alcohol Use Yes   Comment: shots of liquor for pain    Family History:  Family History  Problem Relation Age of Onset   Dementia Mother    Cancer Father        colon   Arthritis Brother    Hypertension Daughter    Aneurysm Brother     Past medical history, surgical history, medications, allergies, family history and social history reviewed with patient today and changes made to appropriate areas of the chart.   Review of Systems  Eyes:  Negative for blurred vision and double vision.  Respiratory:  Negative for shortness of breath.   Cardiovascular:  Negative for chest pain, palpitations and leg swelling.  Musculoskeletal:        Back pain  Neurological:  Negative for dizziness and headaches.  Psychiatric/Behavioral:  Positive for depression. Negative for suicidal ideas. The patient has insomnia.    All other ROS negative except what is listed above and in the HPI.      Objective:    BP (!) 148/82 (BP Location: Right Arm, Patient Position: Sitting, Cuff Size: Normal)   Temp 98.4 F (36.9 C) (Oral)   Resp 14   Ht 5' 4.57" (1.64 m)   Wt 144 lb (65.3 kg)   LMP 09/13/1975  (Approximate)   SpO2 97%   BMI 24.29 kg/m   Wt Readings from Last 3 Encounters:  07/16/23 144 lb (65.3 kg)  03/07/23 148 lb 3.2 oz (67.2 kg)  02/28/23 148 lb 3.2 oz (67.2 kg)    Physical Exam Vitals and nursing note reviewed.  Constitutional:      General: She is awake. She is not in acute distress.    Appearance: Normal appearance. She is well-developed and normal weight. She is not ill-appearing.  HENT:     Head: Normocephalic and atraumatic.     Right Ear: Hearing, tympanic membrane, ear canal and external ear normal. No drainage.     Left Ear: Hearing, tympanic membrane, ear canal and external ear normal. No drainage.     Nose: Nose normal.     Right Sinus: No maxillary sinus tenderness or frontal sinus tenderness.     Left Sinus: No maxillary sinus tenderness or frontal sinus tenderness.     Mouth/Throat:     Mouth: Mucous membranes are moist.     Pharynx: Oropharynx is clear. Uvula midline. No pharyngeal swelling, oropharyngeal exudate or posterior oropharyngeal erythema.  Eyes:  General: Lids are normal.        Right eye: No discharge.        Left eye: No discharge.     Extraocular Movements: Extraocular movements intact.     Conjunctiva/sclera: Conjunctivae normal.     Pupils: Pupils are equal, round, and reactive to light.     Visual Fields: Right eye visual fields normal and left eye visual fields normal.  Neck:     Thyroid : No thyromegaly.     Vascular: No carotid bruit.     Trachea: Trachea normal.  Cardiovascular:     Rate and Rhythm: Normal rate and regular rhythm.     Heart sounds: Normal heart sounds. No murmur heard.    No gallop.  Pulmonary:     Effort: Pulmonary effort is normal. No accessory muscle usage or respiratory distress.     Breath sounds: Normal breath sounds.  Chest:  Breasts:    Right: Normal.     Left: Normal.  Abdominal:     General: Bowel sounds are normal.     Palpations: Abdomen is soft. There is no hepatomegaly or splenomegaly.      Tenderness: There is no abdominal tenderness.  Musculoskeletal:        General: Normal range of motion.     Cervical back: Normal range of motion and neck supple.     Right lower leg: No edema.     Left lower leg: No edema.  Lymphadenopathy:     Head:     Right side of head: No submental, submandibular, tonsillar, preauricular or posterior auricular adenopathy.     Left side of head: No submental, submandibular, tonsillar, preauricular or posterior auricular adenopathy.     Cervical: No cervical adenopathy.     Upper Body:     Right upper body: No supraclavicular, axillary or pectoral adenopathy.     Left upper body: No supraclavicular, axillary or pectoral adenopathy.  Skin:    General: Skin is warm and dry.     Capillary Refill: Capillary refill takes less than 2 seconds.     Findings: No rash.  Neurological:     Mental Status: She is alert and oriented to person, place, and time.     Gait: Gait is intact.     Deep Tendon Reflexes: Reflexes are normal and symmetric.     Reflex Scores:      Brachioradialis reflexes are 2+ on the right side and 2+ on the left side.      Patellar reflexes are 2+ on the right side and 2+ on the left side. Psychiatric:        Attention and Perception: Attention normal.        Mood and Affect: Mood normal.        Speech: Speech normal.        Behavior: Behavior normal. Behavior is cooperative.        Thought Content: Thought content normal.        Judgment: Judgment normal.     Results for orders placed or performed in visit on 02/28/23  Comp Met (CMET)   Collection Time: 02/28/23 10:50 AM  Result Value Ref Range   Glucose 87 70 - 99 mg/dL   BUN 15 8 - 27 mg/dL   Creatinine, Ser 1.61 (H) 0.57 - 1.00 mg/dL   eGFR 52 (L) >09 UE/AVW/0.98   BUN/Creatinine Ratio 13 12 - 28   Sodium 138 134 - 144 mmol/L   Potassium 4.3 3.5 - 5.2  mmol/L   Chloride 103 96 - 106 mmol/L   CO2 20 20 - 29 mmol/L   Calcium  9.0 8.7 - 10.3 mg/dL   Total Protein 6.2  6.0 - 8.5 g/dL   Albumin 3.8 (L) 3.9 - 4.9 g/dL   Globulin, Total 2.4 1.5 - 4.5 g/dL   Bilirubin Total 0.5 0.0 - 1.2 mg/dL   Alkaline Phosphatase 115 44 - 121 IU/L   AST 21 0 - 40 IU/L   ALT 13 0 - 32 IU/L  CBC w/Diff   Collection Time: 02/28/23 10:50 AM  Result Value Ref Range   WBC 13.2 (H) 3.4 - 10.8 x10E3/uL   RBC 4.32 3.77 - 5.28 x10E6/uL   Hemoglobin 13.7 11.1 - 15.9 g/dL   Hematocrit 16.1 09.6 - 46.6 %   MCV 94 79 - 97 fL   MCH 31.7 26.6 - 33.0 pg   MCHC 33.8 31.5 - 35.7 g/dL   RDW 04.5 40.9 - 81.1 %   Platelets 380 150 - 450 x10E3/uL   Neutrophils 66 Not Estab. %   Lymphs 19 Not Estab. %   Monocytes 9 Not Estab. %   Eos 4 Not Estab. %   Basos 1 Not Estab. %   Neutrophils Absolute 8.9 (H) 1.4 - 7.0 x10E3/uL   Lymphocytes Absolute 2.5 0.7 - 3.1 x10E3/uL   Monocytes Absolute 1.2 (H) 0.1 - 0.9 x10E3/uL   EOS (ABSOLUTE) 0.5 (H) 0.0 - 0.4 x10E3/uL   Basophils Absolute 0.1 0.0 - 0.2 x10E3/uL   Immature Granulocytes 1 Not Estab. %   Immature Grans (Abs) 0.1 0.0 - 0.1 x10E3/uL  Lipid Profile   Collection Time: 02/28/23 10:50 AM  Result Value Ref Range   Cholesterol, Total 173 100 - 199 mg/dL   Triglycerides 914 0 - 149 mg/dL   HDL 77 >78 mg/dL   VLDL Cholesterol Cal 20 5 - 40 mg/dL   LDL Chol Calc (NIH) 76 0 - 99 mg/dL   Chol/HDL Ratio 2.2 0.0 - 4.4 ratio      Assessment & Plan:   Problem List Items Addressed This Visit       Cardiovascular and Mediastinum   Hypertensive heart failure (HCC)   Relevant Medications   losartan  (COZAAR ) 50 MG tablet   Other Relevant Orders   Comprehensive metabolic panel with GFR     Respiratory   COPD (chronic obstructive pulmonary disease) (HCC)   Chronic.  Controlled.  Continue with current medication regimen of Symbicort .  Recommend daily use.  Labs ordered today.  Return to clinic in 6 months for reevaluation.  Call sooner if concerns arise.          Genitourinary   CKD (chronic kidney disease), stage IV (HCC)   Chronic.   Controlled.  Continue with current medication regimen.  Labs ordered today.  Return to clinic in 6 months for reevaluation.  Call sooner if concerns arise.        Relevant Orders   CBC With Diff/Platelet     Other   Hyperlipidemia   Relevant Medications   losartan  (COZAAR ) 50 MG tablet   Other Relevant Orders   Lipid panel   Chronic pain syndrome   Chronic pain.  On Oxycodone .  Followed by [facility].  Reviewed most recent note.       Moderate episode of recurrent major depressive disorder (HCC)   Chronic. Not well controlled.  Will increase Seroquel  100mg  nightly.  Recommend continuing Trintellix  and Wellbutrin .  Follow up in 3 months.  Call sooner if concerns arise.       Prediabetes   Chronic.  Controlled.  Continue with current medication regimen.  Labs ordered today.  Return to clinic in 6 months for reevaluation.  Call sooner if concerns arise.        Relevant Orders   Hemoglobin A1c   Advanced care planning/counseling discussion   A voluntary discussion about advance care planning including the explanation and discussion of advance directives was extensively discussed  with the patient for 5 minutes with patient.  Explanation about the health care proxy and Living will was reviewed and packet with forms with explanation of how to fill them out was given.  Patient was offered a separate Advance Care Planning visit for further assistance with forms.         Other Visit Diagnoses       Encounter for Medicare annual wellness exam    -  Primary     Annual physical exam       Relevant Orders   Hemoglobin A1c   Comprehensive metabolic panel with GFR   Lipid panel   CBC With Diff/Platelet   Vitamin D  (25 hydroxy)        Follow up plan: Return in 3 months (on 10/16/2023) for HTN, HLD, DM2 FU.   LABORATORY TESTING:  - Pap smear: not applicable  IMMUNIZATIONS:   - Tdap: Tetanus vaccination status reviewed: last tetanus booster within 10 years. -  Influenza: Up to date - Pneumovax: Up to date - Prevnar: Up to date - COVID: Up to date - HPV: Not applicable - Shingrix vaccine: Up to date  SCREENING: -Mammogram: Ordered today - will hold off on scheduling until she finishes her back imaging - Colonoscopy: Up to date  - Bone Density: Up to date  -Hearing Test: Not applicable  -Spirometry: Not applicable   PATIENT COUNSELING:   Advised to take 1 mg of folate supplement per day if capable of pregnancy.   Sexuality: Discussed sexually transmitted diseases, partner selection, use of condoms, avoidance of unintended pregnancy  and contraceptive alternatives.   Advised to avoid cigarette smoking.  I discussed with the patient that most people either abstain from alcohol or drink within safe limits (<=14/week and <=4 drinks/occasion for males, <=7/weeks and <= 3 drinks/occasion for females) and that the risk for alcohol disorders and other health effects rises proportionally with the number of drinks per week and how often a drinker exceeds daily limits.  Discussed cessation/primary prevention of drug use and availability of treatment for abuse.   Diet: Encouraged to adjust caloric intake to maintain  or achieve ideal body weight, to reduce intake of dietary saturated fat and total fat, to limit sodium intake by avoiding high sodium foods and not adding table salt, and to maintain adequate dietary potassium and calcium  preferably from fresh fruits, vegetables, and low-fat dairy products.    stressed the importance of regular exercise  Injury prevention: Discussed safety belts, safety helmets, smoke detector, smoking near bedding or upholstery.   Dental health: Discussed importance of regular tooth brushing, flossing, and dental visits.    NEXT PREVENTATIVE PHYSICAL DUE IN 1 YEAR. Return in 3 months (on 10/16/2023) for HTN, HLD, DM2 FU.

## 2023-07-17 ENCOUNTER — Ambulatory Visit: Payer: Self-pay | Admitting: Nurse Practitioner

## 2023-07-17 DIAGNOSIS — R748 Abnormal levels of other serum enzymes: Secondary | ICD-10-CM

## 2023-07-17 LAB — CBC WITH DIFF/PLATELET
Basophils Absolute: 0.1 10*3/uL (ref 0.0–0.2)
Basos: 1 %
EOS (ABSOLUTE): 0.3 10*3/uL (ref 0.0–0.4)
Eos: 4 %
Hematocrit: 40.6 % (ref 34.0–46.6)
Hemoglobin: 13.5 g/dL (ref 11.1–15.9)
Immature Grans (Abs): 0 10*3/uL (ref 0.0–0.1)
Immature Granulocytes: 0 %
Lymphocytes Absolute: 1.4 10*3/uL (ref 0.7–3.1)
Lymphs: 23 %
MCH: 31.2 pg (ref 26.6–33.0)
MCHC: 33.3 g/dL (ref 31.5–35.7)
MCV: 94 fL (ref 79–97)
Monocytes Absolute: 0.7 10*3/uL (ref 0.1–0.9)
Monocytes: 11 %
Neutrophils Absolute: 3.8 10*3/uL (ref 1.4–7.0)
Neutrophils: 61 %
Platelets: 297 10*3/uL (ref 150–450)
RBC: 4.33 x10E6/uL (ref 3.77–5.28)
RDW: 13.5 % (ref 11.7–15.4)
WBC: 6.2 10*3/uL (ref 3.4–10.8)

## 2023-07-17 LAB — LIPID PANEL
Chol/HDL Ratio: 2 ratio (ref 0.0–4.4)
Cholesterol, Total: 159 mg/dL (ref 100–199)
HDL: 79 mg/dL (ref >39)
LDL Chol Calc (NIH): 65 mg/dL (ref 0–99)
Triglycerides: 78 mg/dL (ref 0–149)
VLDL Cholesterol Cal: 15 mg/dL (ref 5–40)

## 2023-07-17 LAB — VITAMIN D 25 HYDROXY (VIT D DEFICIENCY, FRACTURES): Vit D, 25-Hydroxy: 24.6 ng/mL — ABNORMAL LOW (ref 30.0–100.0)

## 2023-07-17 LAB — COMPREHENSIVE METABOLIC PANEL WITH GFR
ALT: 111 IU/L — ABNORMAL HIGH (ref 0–32)
AST: 114 IU/L — ABNORMAL HIGH (ref 0–40)
Albumin: 3.8 g/dL — ABNORMAL LOW (ref 3.9–4.9)
Alkaline Phosphatase: 145 IU/L — ABNORMAL HIGH (ref 44–121)
BUN/Creatinine Ratio: 9 — ABNORMAL LOW (ref 12–28)
BUN: 8 mg/dL (ref 8–27)
Bilirubin Total: 0.4 mg/dL (ref 0.0–1.2)
CO2: 22 mmol/L (ref 20–29)
Calcium: 8.9 mg/dL (ref 8.7–10.3)
Chloride: 105 mmol/L (ref 96–106)
Creatinine, Ser: 0.89 mg/dL (ref 0.57–1.00)
Globulin, Total: 2.2 g/dL (ref 1.5–4.5)
Glucose: 80 mg/dL (ref 70–99)
Potassium: 4.6 mmol/L (ref 3.5–5.2)
Sodium: 141 mmol/L (ref 134–144)
Total Protein: 6 g/dL (ref 6.0–8.5)
eGFR: 70 mL/min/{1.73_m2} (ref >59)

## 2023-07-17 LAB — HEMOGLOBIN A1C
Est. average glucose Bld gHb Est-mCnc: 120 mg/dL
Hgb A1c MFr Bld: 5.8 % — ABNORMAL HIGH (ref 4.8–5.6)

## 2023-07-25 ENCOUNTER — Ambulatory Visit: Payer: Self-pay | Admitting: Nurse Practitioner

## 2023-07-25 ENCOUNTER — Ambulatory Visit
Admission: RE | Admit: 2023-07-25 | Discharge: 2023-07-25 | Disposition: A | Discharge status: Home / Self Care | Source: Ambulatory Visit | Attending: Nurse Practitioner | Admitting: Nurse Practitioner

## 2023-07-25 DIAGNOSIS — R748 Abnormal levels of other serum enzymes: Secondary | ICD-10-CM | POA: Diagnosis not present

## 2023-07-28 ENCOUNTER — Other Ambulatory Visit: Payer: Self-pay | Admitting: Nurse Practitioner

## 2023-07-29 ENCOUNTER — Encounter

## 2023-07-29 NOTE — Telephone Encounter (Signed)
 Requested Prescriptions  Pending Prescriptions Disp Refills   vortioxetine  HBr (TRINTELLIX ) 20 MG TABS tablet [Pharmacy Med Name: TRINTELLIX  20 MG TABLET 20 Tablet] 90 tablet 0    Sig: TAKE 1 TABLET (20 MG TOTAL) BY MOUTH DAILY.     Psychiatry: Antidepressants - Serotonin Modulator Failed - 07/29/2023 11:10 AM      Failed - Valid encounter within last 6 months    Recent Outpatient Visits           1 week ago Encounter for Medicare annual wellness exam   [facility] Aileen Alexanders, NP              Passed - Completed PHQ-2 or PHQ-9 in the last 360 days

## 2023-08-05 ENCOUNTER — Other Ambulatory Visit: Payer: Self-pay | Admitting: Nurse Practitioner

## 2023-08-06 DIAGNOSIS — G894 Chronic pain syndrome: Secondary | ICD-10-CM | POA: Diagnosis not present

## 2023-08-06 DIAGNOSIS — Z79891 Long term (current) use of opiate analgesic: Secondary | ICD-10-CM | POA: Diagnosis not present

## 2023-08-07 NOTE — Telephone Encounter (Signed)
 Requested Prescriptions  Pending Prescriptions Disp Refills   buPROPion  (WELLBUTRIN  XL) 150 MG 24 hr tablet [Pharmacy Med Name: BUPROPION  HCL XL 150 MG TAB 150 Tablet] 90 tablet 1    Sig: TAKE 1 TABLET (150 MG TOTAL) BY MOUTH DAILY.     Psychiatry: Antidepressants - bupropion  Failed - 08/07/2023  2:26 PM      Failed - AST in normal range and within 360 days    AST  Date Value Ref Range Status  07/16/2023 114 (H) 0 - 40 IU/L Final   SGOT(AST)  Date Value Ref Range Status  09/14/2013 36 15 - 37 Unit/L Final         Failed - ALT in normal range and within 360 days    ALT  Date Value Ref Range Status  07/16/2023 111 (H) 0 - 32 IU/L Final   SGPT (ALT)  Date Value Ref Range Status  09/14/2013 45 U/L Final    Comment:    14-63 NOTE: New Reference Range 09/07/13          Failed - Last BP in normal range    BP Readings from Last 1 Encounters:  07/16/23 (!) 148/82         Failed - Valid encounter within last 6 months    Recent Outpatient Visits           3 weeks ago Encounter for [employer] annual wellness exam   [place] [facility] Aileen Alexanders, NP              Passed - Cr in normal range and within 360 days    Creatinine  Date Value Ref Range Status  09/14/2013 0.88 0.60 - 1.30 mg/dL Final   Creatinine, Ser  Date Value Ref Range Status  07/16/2023 0.89 0.57 - 1.00 mg/dL Final         Passed - Completed PHQ-2 or PHQ-9 in the last 360 days       ondansetron  (ZOFRAN -ODT) 4 MG disintegrating tablet [Pharmacy Med Name: ONDANSETRON  4 MG ODT 4 Tablet] 20 tablet 6    Sig: DISSOLVE 1 TABLET ON TONGUE EVERY 8 HOURS AS NEEDED FOR NAUSEA OR VOMITING.     Not Delegated - Gastroenterology: Antiemetics - ondansetron  Failed - 08/07/2023  2:26 PM      Failed - This refill cannot be delegated      Failed - AST in normal range and within 360 days    AST  Date Value Ref Range Status  07/16/2023 114 (H) 0 - 40 IU/L Final   SGOT(AST)  Date Value Ref  Range Status  09/14/2013 36 15 - 37 Unit/L Final         Failed - ALT in normal range and within 360 days    ALT  Date Value Ref Range Status  07/16/2023 111 (H) 0 - 32 IU/L Final   SGPT (ALT)  Date Value Ref Range Status  09/14/2013 45 U/L Final    Comment:    14-63 NOTE: New Reference Range 09/07/13          Failed - Valid encounter within last 6 months    Recent Outpatient Visits           3 weeks ago Encounter for Medicare annual wellness exam   [facility] [facility] Aileen Alexanders, NP

## 2023-08-07 NOTE — Telephone Encounter (Signed)
 Requested medications are due for refill today.  yes  Requested medications are on the active medications list.  yes  Last refill. 08/14/2022 #20 6 rf  Future visit scheduled.   yes  Notes to clinic.  Refill not delegated.    Requested Prescriptions  Pending Prescriptions Disp Refills   ondansetron  (ZOFRAN -ODT) 4 MG disintegrating tablet [Pharmacy Med Name: ONDANSETRON  4 MG ODT 4 Tablet] 20 tablet 6    Sig: DISSOLVE 1 TABLET ON TONGUE EVERY 8 HOURS AS NEEDED FOR NAUSEA OR VOMITING.     Not Delegated - Gastroenterology: Antiemetics - ondansetron  Failed - 08/07/2023  2:27 PM      Failed - This refill cannot be delegated      Failed - AST in normal range and within 360 days    AST  Date Value Ref Range Status  07/16/2023 114 (H) 0 - 40 IU/L Final   SGOT(AST)  Date Value Ref Range Status  09/14/2013 36 15 - 37 Unit/L Final         Failed - ALT in normal range and within 360 days    ALT  Date Value Ref Range Status  07/16/2023 111 (H) 0 - 32 IU/L Final   SGPT (ALT)  Date Value Ref Range Status  09/14/2013 45 U/L Final    Comment:    14-63 NOTE: New Reference Range 09/07/13          Failed - Valid encounter within last 6 months    Recent Outpatient Visits           3 weeks ago Encounter for Medicare annual wellness exam   [facility] Aileen Alexanders, NP              Signed Prescriptions Disp Refills   buPROPion  (WELLBUTRIN  XL) 150 MG 24 hr tablet 90 tablet 1    Sig: TAKE 1 TABLET (150 MG TOTAL) BY MOUTH DAILY.     Psychiatry: Antidepressants - bupropion  Failed - 08/07/2023  2:27 PM      Failed - AST in normal range and within 360 days    AST  Date Value Ref Range Status  07/16/2023 114 (H) 0 - 40 IU/L Final   SGOT(AST)  Date Value Ref Range Status  09/14/2013 36 15 - 37 Unit/L Final         Failed - ALT in normal range and within 360 days    ALT  Date Value Ref Range Status  07/16/2023 111 (H) 0 - 32 IU/L Final   SGPT  (ALT)  Date Value Ref Range Status  09/14/2013 45 U/L Final    Comment:    14-63 NOTE: New Reference Range 09/07/13          Failed - Last BP in normal range    BP Readings from Last 1 Encounters:  07/16/23 (!) 148/82         Failed - Valid encounter within last 6 months    Recent Outpatient Visits           3 weeks ago Encounter for [employer] annual wellness exam   [place] [facility] Aileen Alexanders, NP              Passed - Cr in normal range and within 360 days    Creatinine  Date Value Ref Range Status  09/14/2013 0.88 0.60 - 1.30 mg/dL Final   Creatinine, Ser  Date Value Ref Range Status  07/16/2023 0.89 0.57 - 1.00 mg/dL Final  Passed - Completed PHQ-2 or PHQ-9 in the last 360 days

## 2023-08-13 ENCOUNTER — Other Ambulatory Visit: Payer: Self-pay | Admitting: Medical Genetics

## 2023-09-02 DIAGNOSIS — G894 Chronic pain syndrome: Secondary | ICD-10-CM | POA: Diagnosis not present

## 2023-09-08 DIAGNOSIS — Z96643 Presence of artificial hip joint, bilateral: Secondary | ICD-10-CM | POA: Diagnosis not present

## 2023-09-08 DIAGNOSIS — Z96641 Presence of right artificial hip joint: Secondary | ICD-10-CM | POA: Diagnosis not present

## 2023-09-08 DIAGNOSIS — Z471 Aftercare following joint replacement surgery: Secondary | ICD-10-CM | POA: Diagnosis not present

## 2023-09-08 DIAGNOSIS — M1611 Unilateral primary osteoarthritis, right hip: Secondary | ICD-10-CM | POA: Diagnosis not present

## 2023-09-30 DIAGNOSIS — G894 Chronic pain syndrome: Secondary | ICD-10-CM | POA: Diagnosis not present

## 2023-10-06 ENCOUNTER — Other Ambulatory Visit: Payer: Self-pay | Admitting: Nurse Practitioner

## 2023-10-08 NOTE — Telephone Encounter (Signed)
 Requested Prescriptions  Pending Prescriptions Disp Refills   atorvastatin  (LIPITOR) 40 MG tablet [Pharmacy Med Name: ATORVASTATIN  40 MG TABLET 40 Tablet] 90 tablet 0    Sig: TAKE 1 TABLET (40 MG TOTAL) BY MOUTH AT BEDTIME.     Cardiovascular:  Antilipid - Statins Failed - 10/08/2023  9:56 AM      Failed - Lipid Panel in normal range within the last 12 months    Cholesterol, Total  Date Value Ref Range Status  07/16/2023 159 100 - 199 mg/dL Final   LDL Chol Calc (NIH)  Date Value Ref Range Status  07/16/2023 65 0 - 99 mg/dL Final   HDL  Date Value Ref Range Status  07/16/2023 79 >39 mg/dL Final   Triglycerides  Date Value Ref Range Status  07/16/2023 78 0 - 149 mg/dL Final         Passed - Patient is not pregnant      Passed - Valid encounter within last 12 months    Recent Outpatient Visits           2 months ago Encounter for [employer] annual wellness exam   [facility] Melvin Pao, NP               montelukast  (SINGULAIR ) 10 MG tablet [Pharmacy Med Name: MONTELUKAST  SODIUM 10 MG TA 10 Tablet] 90 tablet 0    Sig: TAKE 1 TABLET (10 MG TOTAL) BY MOUTH AT BEDTIME.     Pulmonology:  Leukotriene Inhibitors Passed - 10/08/2023  9:56 AM      Passed - Valid encounter within last 12 months    Recent Outpatient Visits           2 months ago Encounter for [employer] annual wellness exam    [facility] Melvin Pao, NP

## 2023-10-16 ENCOUNTER — Ambulatory Visit: Admitting: Nurse Practitioner

## 2023-10-25 ENCOUNTER — Other Ambulatory Visit: Payer: Self-pay | Admitting: Nurse Practitioner

## 2023-10-27 NOTE — Telephone Encounter (Signed)
 Requested medication (s) are due for refill today: yes  Requested medication (s) are on the active medication list: yes  Last refill:  11/28/22  Future visit scheduled: yes  Notes to clinic:  abnormal lab work    Requested Prescriptions  Pending Prescriptions Disp Refills   buPROPion  (WELLBUTRIN  XL) 300 MG 24 hr tablet [Pharmacy Med Name: BUPROPION  HCL XL 300 MG TAB 300 Tablet] 90 tablet 1    Sig: Take 1 tablet (300 mg total) by mouth every morning.     Psychiatry: Antidepressants - bupropion  Failed - 10/27/2023  1:59 PM      Failed - AST in normal range and within 360 days    AST  Date Value Ref Range Status  07/16/2023 114 (H) 0 - 40 IU/L Final   SGOT(AST)  Date Value Ref Range Status  09/14/2013 36 15 - 37 Unit/L Final         Failed - ALT in normal range and within 360 days    ALT  Date Value Ref Range Status  07/16/2023 111 (H) 0 - 32 IU/L Final   SGPT (ALT)  Date Value Ref Range Status  09/14/2013 45 U/L Final    Comment:    14-63 NOTE: New Reference Range 09/07/13          Failed - Last BP in normal range    BP Readings from Last 1 Encounters:  07/16/23 (!) 148/82         Passed - Cr in normal range and within 360 days    Creatinine  Date Value Ref Range Status  09/14/2013 0.88 0.60 - 1.30 mg/dL Final   Creatinine, Ser  Date Value Ref Range Status  07/16/2023 0.89 0.57 - 1.00 mg/dL Final         Passed - Completed PHQ-2 or PHQ-9 in the last 360 days      Passed - Valid encounter within last 6 months    Recent Outpatient Visits           3 months ago Encounter for [employer] annual wellness exam   [facility] Melvin Pao, NP

## 2023-10-28 DIAGNOSIS — G894 Chronic pain syndrome: Secondary | ICD-10-CM | POA: Diagnosis not present

## 2023-10-28 DIAGNOSIS — M25551 Pain in right hip: Secondary | ICD-10-CM | POA: Diagnosis not present

## 2023-10-28 DIAGNOSIS — I739 Peripheral vascular disease, unspecified: Secondary | ICD-10-CM | POA: Diagnosis not present

## 2023-10-28 DIAGNOSIS — M5136 Other intervertebral disc degeneration, lumbar region with discogenic back pain only: Secondary | ICD-10-CM | POA: Diagnosis not present

## 2023-11-04 ENCOUNTER — Ambulatory Visit: Admitting: Nurse Practitioner

## 2023-11-21 DIAGNOSIS — R29890 Loss of height: Secondary | ICD-10-CM | POA: Diagnosis not present

## 2023-11-21 DIAGNOSIS — R112 Nausea with vomiting, unspecified: Secondary | ICD-10-CM | POA: Diagnosis not present

## 2023-11-21 DIAGNOSIS — Z96641 Presence of right artificial hip joint: Secondary | ICD-10-CM | POA: Diagnosis not present

## 2023-11-21 DIAGNOSIS — Z96642 Presence of left artificial hip joint: Secondary | ICD-10-CM | POA: Diagnosis not present

## 2023-11-21 DIAGNOSIS — T84060A Wear of articular bearing surface of internal prosthetic right hip joint, initial encounter: Secondary | ICD-10-CM | POA: Diagnosis not present

## 2023-11-21 DIAGNOSIS — Z01818 Encounter for other preprocedural examination: Secondary | ICD-10-CM | POA: Diagnosis not present

## 2023-11-21 DIAGNOSIS — Z9889 Other specified postprocedural states: Secondary | ICD-10-CM | POA: Diagnosis not present

## 2023-11-21 DIAGNOSIS — E559 Vitamin D deficiency, unspecified: Secondary | ICD-10-CM | POA: Diagnosis not present

## 2023-11-21 DIAGNOSIS — I10 Essential (primary) hypertension: Secondary | ICD-10-CM | POA: Diagnosis not present

## 2023-11-21 DIAGNOSIS — K219 Gastro-esophageal reflux disease without esophagitis: Secondary | ICD-10-CM | POA: Diagnosis not present

## 2023-11-21 DIAGNOSIS — M25551 Pain in right hip: Secondary | ICD-10-CM | POA: Diagnosis not present

## 2023-11-21 DIAGNOSIS — Z96643 Presence of artificial hip joint, bilateral: Secondary | ICD-10-CM | POA: Diagnosis not present

## 2023-11-21 DIAGNOSIS — R7989 Other specified abnormal findings of blood chemistry: Secondary | ICD-10-CM | POA: Diagnosis not present

## 2023-11-21 DIAGNOSIS — Y792 Prosthetic and other implants, materials and accessory orthopedic devices associated with adverse incidents: Secondary | ICD-10-CM | POA: Diagnosis not present

## 2023-11-25 DIAGNOSIS — M169 Osteoarthritis of hip, unspecified: Secondary | ICD-10-CM | POA: Diagnosis not present

## 2023-11-25 DIAGNOSIS — G894 Chronic pain syndrome: Secondary | ICD-10-CM | POA: Diagnosis not present

## 2023-12-02 ENCOUNTER — Other Ambulatory Visit: Payer: Self-pay | Admitting: Nurse Practitioner

## 2023-12-04 NOTE — Telephone Encounter (Signed)
 Requested Prescriptions  Pending Prescriptions Disp Refills   TRINTELLIX  20 MG TABS tablet [Pharmacy Med Name: TRINTELLIX  20 MG TABLET 20 Tablet] 90 tablet 0    Sig: TAKE 1 TABLET (20 MG TOTAL) BY MOUTH DAILY.     Psychiatry: Antidepressants - Serotonin Modulator Passed - 12/04/2023  2:57 PM      Passed - Completed PHQ-2 or PHQ-9 in the last 360 days      Passed - Valid encounter within last 6 months    Recent Outpatient Visits           4 months ago Encounter for [employer] annual wellness exam   [facility] Melvin Pao, NP

## 2023-12-05 ENCOUNTER — Other Ambulatory Visit: Payer: Self-pay | Admitting: Medical Genetics

## 2023-12-05 DIAGNOSIS — Z791 Long term (current) use of non-steroidal anti-inflammatories (NSAID): Secondary | ICD-10-CM | POA: Diagnosis not present

## 2023-12-05 DIAGNOSIS — K589 Irritable bowel syndrome without diarrhea: Secondary | ICD-10-CM | POA: Diagnosis not present

## 2023-12-05 DIAGNOSIS — G8918 Other acute postprocedural pain: Secondary | ICD-10-CM | POA: Diagnosis not present

## 2023-12-05 DIAGNOSIS — Z87891 Personal history of nicotine dependence: Secondary | ICD-10-CM | POA: Diagnosis not present

## 2023-12-05 DIAGNOSIS — T8489XA Other specified complication of internal orthopedic prosthetic devices, implants and grafts, initial encounter: Secondary | ICD-10-CM | POA: Diagnosis not present

## 2023-12-05 DIAGNOSIS — M25551 Pain in right hip: Secondary | ICD-10-CM | POA: Diagnosis not present

## 2023-12-05 DIAGNOSIS — N184 Chronic kidney disease, stage 4 (severe): Secondary | ICD-10-CM | POA: Diagnosis not present

## 2023-12-05 DIAGNOSIS — Z471 Aftercare following joint replacement surgery: Secondary | ICD-10-CM | POA: Diagnosis not present

## 2023-12-05 DIAGNOSIS — Z96642 Presence of left artificial hip joint: Secondary | ICD-10-CM | POA: Diagnosis not present

## 2023-12-05 DIAGNOSIS — Z006 Encounter for examination for normal comparison and control in clinical research program: Secondary | ICD-10-CM

## 2023-12-05 DIAGNOSIS — E785 Hyperlipidemia, unspecified: Secondary | ICD-10-CM | POA: Diagnosis not present

## 2023-12-05 DIAGNOSIS — Z881 Allergy status to other antibiotic agents status: Secondary | ICD-10-CM | POA: Diagnosis not present

## 2023-12-05 DIAGNOSIS — T84050A Periprosthetic osteolysis of internal prosthetic right hip joint, initial encounter: Secondary | ICD-10-CM | POA: Diagnosis not present

## 2023-12-05 DIAGNOSIS — J4489 Other specified chronic obstructive pulmonary disease: Secondary | ICD-10-CM | POA: Diagnosis not present

## 2023-12-05 DIAGNOSIS — I951 Orthostatic hypotension: Secondary | ICD-10-CM | POA: Diagnosis not present

## 2023-12-05 DIAGNOSIS — I509 Heart failure, unspecified: Secondary | ICD-10-CM | POA: Diagnosis not present

## 2023-12-05 DIAGNOSIS — Z96641 Presence of right artificial hip joint: Secondary | ICD-10-CM | POA: Diagnosis not present

## 2023-12-05 DIAGNOSIS — I130 Hypertensive heart and chronic kidney disease with heart failure and stage 1 through stage 4 chronic kidney disease, or unspecified chronic kidney disease: Secondary | ICD-10-CM | POA: Diagnosis not present

## 2023-12-05 DIAGNOSIS — G2581 Restless legs syndrome: Secondary | ICD-10-CM | POA: Diagnosis not present

## 2023-12-05 DIAGNOSIS — T84060A Wear of articular bearing surface of internal prosthetic right hip joint, initial encounter: Secondary | ICD-10-CM | POA: Diagnosis not present

## 2023-12-05 DIAGNOSIS — I10 Essential (primary) hypertension: Secondary | ICD-10-CM | POA: Diagnosis not present

## 2023-12-05 DIAGNOSIS — G894 Chronic pain syndrome: Secondary | ICD-10-CM | POA: Diagnosis not present

## 2023-12-05 DIAGNOSIS — S73005A Unspecified dislocation of left hip, initial encounter: Secondary | ICD-10-CM | POA: Diagnosis not present

## 2023-12-05 DIAGNOSIS — K219 Gastro-esophageal reflux disease without esophagitis: Secondary | ICD-10-CM | POA: Diagnosis not present

## 2023-12-05 DIAGNOSIS — Z96649 Presence of unspecified artificial hip joint: Secondary | ICD-10-CM | POA: Diagnosis not present

## 2023-12-05 DIAGNOSIS — Z87442 Personal history of urinary calculi: Secondary | ICD-10-CM | POA: Diagnosis not present

## 2023-12-05 DIAGNOSIS — E559 Vitamin D deficiency, unspecified: Secondary | ICD-10-CM | POA: Diagnosis not present

## 2023-12-05 DIAGNOSIS — I2510 Atherosclerotic heart disease of native coronary artery without angina pectoris: Secondary | ICD-10-CM | POA: Diagnosis not present

## 2023-12-05 DIAGNOSIS — R7303 Prediabetes: Secondary | ICD-10-CM | POA: Diagnosis not present

## 2023-12-05 DIAGNOSIS — K760 Fatty (change of) liver, not elsewhere classified: Secondary | ICD-10-CM | POA: Diagnosis not present

## 2024-01-12 ENCOUNTER — Ambulatory Visit: Payer: Self-pay

## 2024-01-12 NOTE — Telephone Encounter (Signed)
 FYI Only or Action Required?: FYI only for provider: appointment scheduled on 01/13/2024 at 11:20am with Jolene Cannady .  Patient was last seen in primary care on 07/16/2023 by Melvin Pao, NP.  Called Nurse Triage reporting Nasal Congestion.  Symptoms began 3 weeks ago.  Interventions attempted: OTC medications: cold and congestion medicine, Prescription medications: prednisone  prescribed at pain clinic, and Rest, hydration, or home remedies.  Symptoms are: unchanged.  Triage Disposition: See PCP When Office is Open (Within 3 Days)  Patient/caregiver understands and will follow disposition?: Yes            Copied from CRM #[id number]. Topic: Clinical - Red Word Triage >> [name] 12, 2024  8:22 AM Victoria A wrote: [facility] that prompted transfer to Nurse Triage: Patient called said she has been sick for three week patient, body aches. Reason for Disposition  [1] Sinus congestion (pressure, fullness) AND [2] present > 10 days  Answer Assessment - Initial Assessment Questions Nasal congestion x 3 weeks Denies any fevers & states she has just been unable to get rid of this cold on her own and wanted to see a provider She states it started with a sore throat but that went away  Patient has used Singulair , Prednisone  --prescribed by doctor at pain clinic, cold and congestion medication and still hasn't gotten better  Patient is advised to call us  back if anything changes or with any further questions/concerns. Patient is advised that if anything worsens to go to the Emergency Room. Patient verbalized understanding.  Protocols used: Sinus Pain or Congestion-A-AH

## 2024-01-13 ENCOUNTER — Ambulatory Visit (INDEPENDENT_AMBULATORY_CARE_PROVIDER_SITE_OTHER): Admitting: Nurse Practitioner

## 2024-01-13 ENCOUNTER — Encounter: Payer: Self-pay | Admitting: Nurse Practitioner

## 2024-01-13 VITALS — BP 128/86 | HR 80 | Temp 98.4°F | Resp 17 | Ht 64.57 in | Wt 143.0 lb

## 2024-01-13 DIAGNOSIS — R052 Subacute cough: Secondary | ICD-10-CM | POA: Diagnosis not present

## 2024-01-13 MED ORDER — BENZONATATE 100 MG PO CAPS: 100.0000 mg | ORAL_CAPSULE | Freq: Two times a day (BID) | ORAL | 0 refills | Status: AC | PRN

## 2024-01-13 MED ORDER — AMOXICILLIN-POT CLAVULANATE 875-125 MG PO TABS: 1.0000 | ORAL_TABLET | Freq: Two times a day (BID) | ORAL | 0 refills | Status: AC

## 2024-01-13 MED ORDER — ALBUTEROL SULFATE HFA 108 (90 BASE) MCG/ACT IN AERS: 2.0000 | INHALATION_SPRAY | Freq: Four times a day (QID) | RESPIRATORY_TRACT | 3 refills | Status: AC | PRN

## 2024-01-13 MED ORDER — PREDNISONE 20 MG PO TABS: 40.0000 mg | ORAL_TABLET | Freq: Every day | ORAL | 0 refills | Status: AC

## 2024-01-13 NOTE — Progress Notes (Signed)
 BP 128/86 (BP Location: Left Arm, Patient Position: Sitting, Cuff Size: Normal)   Pulse 80   Temp 98.4 F (36.9 C) (Oral)   Resp 17   Ht 5' 4.57 (1.64 m)   Wt 143 lb (64.9 kg)   LMP 09/13/1975 (Approximate)   SpO2 93%   BMI 24.12 kg/m    Subjective:    Patient ID: Amy Rocha, female    DOB: 05-22-1955, 68 y.o.   MRN: [id number]  HPI: Amy Rocha is a 68 y.o. female  Chief Complaint  Patient presents with   URI    Ongoing since about a week post op for her hip. Coughing, SOB, run down, no fevers she is aware of. Can't cough anything up even when she tries.    UPPER RESPIRATORY TRACT INFECTION Had hip surgery on October 17th, was in hospital for 4 days. A week after coming home she started to feel bad. Initially a sore throat and laryngitis. Non smoker, has inhalers at home from past illness she has been using. Fever: off and on during the weeks Cough: yes Shortness of breath: yes Wheezing: yes Chest pain: no Chest tightness: yes Chest congestion: yes Nasal congestion: no Runny nose: no Post nasal drip: no Sneezing: no Sore throat: at first, none now Swollen glands: no Sinus pressure: no Headache: no Face pain: no Toothache: no Ear pain: none Ear pressure: none Eyes red/itching:yes Eye drainage/crusting: no  Vomiting: nausea off and on Rash: no Fatigue: yes Sick contacts: no one she knows of, possible exposure at hospital Strep contacts: no  Context: fluctuating Recurrent sinusitis: no Relief with OTC cold/cough medications: no  Treatments attempted: Dayquil/Nyquil, Mucinex   Relevant past medical, surgical, family and social history reviewed and updated as indicated. Interim medical history since our last visit reviewed. Allergies and medications reviewed and updated.  Review of Systems  Constitutional:  Positive for fatigue. Negative for activity change, appetite change, diaphoresis and fever.  HENT:  Negative for congestion, postnasal drip,  rhinorrhea, sinus pressure, sinus pain and sore throat.   Respiratory:  Positive for cough, chest tightness, shortness of breath and wheezing.   Cardiovascular:  Negative for chest pain, palpitations and leg swelling.  Gastrointestinal:  Positive for nausea. Negative for vomiting.  Neurological: Negative.   Psychiatric/Behavioral: Negative.      Per HPI unless specifically indicated above     Objective:    BP 128/86 (BP Location: Left Arm, Patient Position: Sitting, Cuff Size: Normal)   Pulse 80   Temp 98.4 F (36.9 C) (Oral)   Resp 17   Ht 5' 4.57 (1.64 m)   Wt 143 lb (64.9 kg)   LMP 09/13/1975 (Approximate)   SpO2 93%   BMI 24.12 kg/m   Wt Readings from Last 3 Encounters:  01/13/24 143 lb (64.9 kg)  07/16/23 144 lb (65.3 kg)  03/07/23 148 lb 3.2 oz (67.2 kg)    Physical Exam Vitals and nursing note reviewed.  Constitutional:      General: She is awake. She is not in acute distress.    Appearance: She is well-developed and well-groomed. She is ill-appearing. She is not toxic-appearing.  HENT:     Head: Normocephalic.     Right Ear: Hearing, ear canal and external ear normal. A middle ear effusion is present. There is no impacted cerumen. Tympanic membrane is not injected or perforated.     Left Ear: Hearing, ear canal and external ear normal. A middle ear effusion is present. There is no  impacted cerumen. Tympanic membrane is not injected or perforated.     Nose: Rhinorrhea present. Rhinorrhea is clear.     Right Sinus: No maxillary sinus tenderness or frontal sinus tenderness.     Left Sinus: No maxillary sinus tenderness or frontal sinus tenderness.     Mouth/Throat:     Mouth: Mucous membranes are moist.     Pharynx: Posterior oropharyngeal erythema (mild) present. No pharyngeal swelling or oropharyngeal exudate.  Eyes:     General: Lids are normal.        Right eye: No discharge.        Left eye: No discharge.     Conjunctiva/sclera: Conjunctivae normal.      Pupils: Pupils are equal, round, and reactive to light.  Neck:     Thyroid : No thyromegaly.     Vascular: No carotid bruit.  Cardiovascular:     Rate and Rhythm: Normal rate and regular rhythm.     Heart sounds: Normal heart sounds. No murmur heard.    No gallop.  Pulmonary:     Effort: Pulmonary effort is normal. No accessory muscle usage or respiratory distress.     Breath sounds: Wheezing present. No decreased breath sounds or rales.     Comments: Expiratory wheezes throughout lung fields. Intermittent cough present. No SOB with talking. Abdominal:     General: Bowel sounds are normal.     Palpations: Abdomen is soft. There is no hepatomegaly or splenomegaly.  Musculoskeletal:     Cervical back: Normal range of motion and neck supple.     Right lower leg: No edema.     Left lower leg: No edema.  Lymphadenopathy:     Head:     Right side of head: No submental, submandibular, tonsillar, preauricular or posterior auricular adenopathy.     Left side of head: No submental, submandibular, tonsillar, preauricular or posterior auricular adenopathy.     Cervical: No cervical adenopathy.  Skin:    General: Skin is warm and dry.  Neurological:     Mental Status: She is alert and oriented to person, place, and time.  Psychiatric:        Attention and Perception: Attention normal.        Mood and Affect: Mood normal.        Speech: Speech normal.        Behavior: Behavior normal. Behavior is cooperative.        Thought Content: Thought content normal.     Results for orders placed or performed in visit on 07/16/23  Hemoglobin A1c   Collection Time: 07/16/23  1:56 PM  Result Value Ref Range   Hgb A1c MFr Bld 5.8 (H) 4.8 - 5.6 %   Est. average glucose Bld gHb Est-mCnc 120 mg/dL  Comprehensive metabolic panel with GFR   Collection Time: 07/16/23  1:56 PM  Result Value Ref Range   Glucose 80 70 - 99 mg/dL   BUN 8 8 - 27 mg/dL   Creatinine, Ser 9.10 0.57 - 1.00 mg/dL   eGFR 70 >40  fO/fpw/8.26   BUN/Creatinine Ratio 9 (L) 12 - 28   Sodium 141 134 - 144 mmol/L   Potassium 4.6 3.5 - 5.2 mmol/L   Chloride 105 96 - 106 mmol/L   CO2 22 20 - 29 mmol/L   Calcium  8.9 8.7 - 10.3 mg/dL   Total Protein 6.0 6.0 - 8.5 g/dL   Albumin 3.8 (L) 3.9 - 4.9 g/dL   Globulin, Total 2.2 1.5 -  4.5 g/dL   Bilirubin Total 0.4 0.0 - 1.2 mg/dL   Alkaline Phosphatase 145 (H) 44 - 121 IU/L   AST 114 (H) 0 - 40 IU/L   ALT 111 (H) 0 - 32 IU/L  Lipid panel   Collection Time: 07/16/23  1:56 PM  Result Value Ref Range   Cholesterol, Total 159 100 - 199 mg/dL   Triglycerides 78 0 - 149 mg/dL   HDL 79 >60 mg/dL   VLDL Cholesterol Cal 15 5 - 40 mg/dL   LDL Chol Calc (NIH) 65 0 - 99 mg/dL   Chol/HDL Ratio 2.0 0.0 - 4.4 ratio  CBC With Diff/Platelet   Collection Time: 07/16/23  1:56 PM  Result Value Ref Range   WBC 6.2 3.4 - 10.8 x10E3/uL   RBC 4.33 3.77 - 5.28 x10E6/uL   Hemoglobin 13.5 11.1 - 15.9 g/dL   Hematocrit 59.3 65.9 - 46.6 %   MCV 94 79 - 97 fL   MCH 31.2 26.6 - 33.0 pg   MCHC 33.3 31.5 - 35.7 g/dL   RDW 86.4 88.2 - 84.5 %   Platelets 297 150 - 450 x10E3/uL   Neutrophils 61 Not Estab. %   Lymphs 23 Not Estab. %   Monocytes 11 Not Estab. %   Eos 4 Not Estab. %   Basos 1 Not Estab. %   Neutrophils Absolute 3.8 1.4 - 7.0 x10E3/uL   Lymphocytes Absolute 1.4 0.7 - 3.1 x10E3/uL   Monocytes Absolute 0.7 0.1 - 0.9 x10E3/uL   EOS (ABSOLUTE) 0.3 0.0 - 0.4 x10E3/uL   Basophils Absolute 0.1 0.0 - 0.2 x10E3/uL   Immature Granulocytes 0 Not Estab. %   Immature Grans (Abs) 0.0 0.0 - 0.1 x10E3/uL  Vitamin D  (25 hydroxy)   Collection Time: 07/16/23  1:56 PM  Result Value Ref Range   Vit D, 25-Hydroxy 24.6 (L) 30.0 - 100.0 ng/mL      Assessment & Plan:   Problem List Items Addressed This Visit       Other   Subacute cough - Primary   Cough ongoing for 3 weeks with no improvement. Expiratory wheezes noted throughout lung fields. No red flags on exam. Stat Augmentin  BID for 7 days,  Prednisone  40 MG daily for 5 days, Tessalon  pills for cough PRN, and Albuterol  inhaler. Plan for her to return in one week for lung check, if ongoing will consider imaging. Recommend: - Increased rest - Increasing Fluids - Acetaminophen  / ibuprofen as needed for fever/pain.  - Salt water gargling, chloraseptic spray and throat lozenges - Mucinex .  - Saline sinus flushes or a neti pot.  - Humidifying the air.         Follow up plan: Return in about 1 week (around 01/20/2024) for Lung Check.

## 2024-01-13 NOTE — Patient Instructions (Signed)

## 2024-01-13 NOTE — Assessment & Plan Note (Signed)
 Cough ongoing for 3 weeks with no improvement. Expiratory wheezes noted throughout lung fields. No red flags on exam. Stat Augmentin  BID for 7 days, Prednisone  40 MG daily for 5 days, Tessalon  pills for cough PRN, and Albuterol  inhaler. Plan for her to return in one week for lung check, if ongoing will consider imaging. Recommend: - Increased rest - Increasing Fluids - Acetaminophen  / ibuprofen as needed for fever/pain.  - Salt water gargling, chloraseptic spray and throat lozenges - Mucinex .  - Saline sinus flushes or a neti pot.  - Humidifying the air.

## 2024-01-14 ENCOUNTER — Ambulatory Visit: Admitting: Nurse Practitioner

## 2024-01-17 LAB — GENECONNECT MOLECULAR SCREEN

## 2024-01-20 ENCOUNTER — Telehealth: Payer: Self-pay | Admitting: Medical Genetics

## 2024-01-22 ENCOUNTER — Ambulatory Visit: Admitting: Nurse Practitioner

## 2024-01-27 ENCOUNTER — Other Ambulatory Visit: Payer: Self-pay | Admitting: Nurse Practitioner

## 2024-01-28 NOTE — Telephone Encounter (Signed)
 GeneConnect   01/28/2024 10:32AM  Attempted to contact participant 3 times via phone call and MyChart message to discuss TNP results and offer a new GeneConnect order. Participant may contact the research team at any time to discuss TNP results and request a new GeneConnect order.   Best,  Jordyn Pennstrom, BS [place]  [facility] Clinical Research Specialist II Direct Dial: [phone]  Fax: [phone]

## 2024-01-29 NOTE — Telephone Encounter (Signed)
 Requested Prescriptions  Pending Prescriptions Disp Refills   omeprazole  (PRILOSEC) 20 MG capsule [Pharmacy Med Name: OMEPRAZOLE  20 MG CPDR 20 Capsule] 90 capsule 0    Sig: TAKE 1 CAPSULE BY MOUTH DAILY.     Gastroenterology: Proton Pump Inhibitors Passed - 01/29/2024 10:03 AM      Passed - Valid encounter within last 12 months    Recent Outpatient Visits           2 weeks ago Subacute cough   [facility], [place] T, NP   6 months ago Encounter for [employer] annual wellness exam   [place] [facility] Melvin Pao, NP

## 2024-02-28 ENCOUNTER — Other Ambulatory Visit: Payer: Self-pay | Admitting: Nurse Practitioner

## 2024-02-28 DIAGNOSIS — I10 Essential (primary) hypertension: Secondary | ICD-10-CM

## 2024-03-01 NOTE — Telephone Encounter (Signed)
 Medications t'd up for 30 day supply only as patient is scheduled to establish care with a new provider at the end of this month.

## 2024-03-16 ENCOUNTER — Ambulatory Visit

## 2024-03-26 ENCOUNTER — Ambulatory Visit: Payer: Self-pay

## 2024-03-26 ENCOUNTER — Other Ambulatory Visit: Payer: Self-pay | Admitting: Nurse Practitioner

## 2024-03-26 NOTE — Telephone Encounter (Signed)
 FYI Only or Action Required?: Action required by provider: medication refill request. Appt scheduled 2/9. Pt would aslo like refill request for seroquel  submitted today be filled for 90 tablets instead of 30 d/t pt now having to pay a copay with medications.   Patient was last seen in primary care on 01/13/2024 by Valerio Melanie DASEN, NP.  Called Nurse Triage reporting Chills and Fatigue.  Symptoms began November 2025.  Interventions attempted: Nothing.  Symptoms are: gradually worsening.  Triage Disposition: See PCP When Office is Open (Within 3 Days)  Patient/caregiver understands and will follow disposition?: Yes    Generalized weakness and cold all the time since the weather has gotten colder around November. Has not happened in the past, normally she feels like she tends to run hot. Moderate weakness, able to walk. No CP, SOB or confusion. Scheduled appt with different provider at home office on Monday d/t no PCP availability within timeframe and pt wanting to be seen sooner. Advised UC or ED for worsening symptoms.     Message from [place] B sent at 03/26/2024 11:16 AM EST  Reason for Triage: feels cold all the time and weak   Reason for Disposition  [1] Fatigue (i.e., tires easily, decreased energy) AND [2] persists > 1 week  Answer Assessment - Initial Assessment Questions 1. DESCRIPTION: Describe how you are feeling.     Generalized weakness and cold all the time 2. SEVERITY: How bad is it?  Can you stand and walk?     Moderate, able to walk 3. ONSET: When did these symptoms begin? (e.g., hours, days, weeks, months)      When the weather has gotten colder around November 2025 4. CAUSE: What do you think is causing the weakness or fatigue? (e.g., not drinking enough fluids, medical problem, trouble sleeping)     Unsure 5. NEW MEDICINES:  Have you started on any new medicines recently? (e.g., opioid pain medicines, benzodiazepines, muscle relaxants,  antidepressants, antihistamines, neuroleptics, beta blockers)     No 6. OTHER SYMPTOMS: Do you have any other symptoms? (e.g., chest pain, fever, cough, SOB, vomiting, diarrhea, bleeding, other areas of pain)     No  Protocols used: Weakness (Generalized) and Fatigue-A-AH

## 2024-03-29 ENCOUNTER — Ambulatory Visit

## 2024-07-22 ENCOUNTER — Encounter
